# Patient Record
Sex: Female | Born: 1935 | Race: White | Hispanic: No | Smoking: Never smoker
Health system: Southern US, Community
[De-identification: ages and names within clinical notes are randomized; demographics above are authoritative.]

## PROBLEM LIST (undated history)

## (undated) DIAGNOSIS — I10 Essential (primary) hypertension: Secondary | ICD-10-CM

## (undated) DIAGNOSIS — I639 Cerebral infarction, unspecified: Secondary | ICD-10-CM

## (undated) DIAGNOSIS — E119 Type 2 diabetes mellitus without complications: Secondary | ICD-10-CM

## (undated) HISTORY — PX: ABDOMINAL HYSTERECTOMY: SHX81

## (undated) HISTORY — PX: KIDNEY STONE SURGERY: SHX686

## (undated) HISTORY — PX: CHOLECYSTECTOMY: SHX55

---

## 2004-09-06 ENCOUNTER — Ambulatory Visit: Payer: Self-pay | Admitting: Internal Medicine

## 2007-04-29 ENCOUNTER — Ambulatory Visit: Payer: Self-pay | Admitting: Internal Medicine

## 2007-08-27 ENCOUNTER — Other Ambulatory Visit: Payer: Self-pay

## 2007-08-27 ENCOUNTER — Ambulatory Visit: Payer: Self-pay | Admitting: Urology

## 2007-08-28 ENCOUNTER — Ambulatory Visit: Payer: Self-pay | Admitting: Cardiology

## 2007-09-02 ENCOUNTER — Ambulatory Visit: Payer: Self-pay | Admitting: Urology

## 2008-06-02 ENCOUNTER — Ambulatory Visit: Payer: Self-pay | Admitting: Internal Medicine

## 2012-04-16 ENCOUNTER — Ambulatory Visit: Payer: Self-pay | Admitting: Ophthalmology

## 2012-04-29 ENCOUNTER — Ambulatory Visit: Payer: Self-pay | Admitting: Ophthalmology

## 2012-07-01 ENCOUNTER — Ambulatory Visit: Payer: Self-pay | Admitting: Ophthalmology

## 2014-10-20 NOTE — Op Note (Signed)
PATIENT NAME:  Suzanne Cantu, Suzanne Cantu MR#:  [id number] DATE OF BIRTH:  [birth year]  DATE OF PROCEDURE:  04/29/2012  PREOPERATIVE DIAGNOSIS:  Cataract, right eye.   POSTOPERATIVE DIAGNOSIS:  Cataract, right eye.  PROCEDURE PERFORMED:  Extracapsular cataract extraction using phacoemulsification with placement of an Alcon SN6CWS, 21.0-diopter posterior chamber lens, serial # [id number].  SURGEON:  Maylon PeppersSteven A. Iain Sawchuk, MD  ASSISTANT:  None.  ANESTHESIA:  4% lidocaine and 0.75% Marcaine in a 50/50 mixture with 10 units/mL of Hylenex added, given as peribulbar.  ANESTHESIOLOGIST:  Dr. Dimple Caseyice   COMPLICATIONS:  None.  ESTIMATED BLOOD LOSS:  Less than 1 mL.  DESCRIPTION OF PROCEDURE:  The patient was brought to the operating room and given a peribulbar block.  The patient was then prepped and draped in the usual fashion.  The vertical rectus muscles were imbricated using 5-0 silk sutures.  These sutures were then clamped to the sterile drapes as bridle sutures.  A limbal peritomy was performed extending two clock hours and hemostasis was obtained with cautery.  A partial thickness scleral groove was made at the surgical limbus and dissected anteriorly in a lamellar dissection using an Alcon crescent knife.  The anterior chamber was entered superonasally with a Superblade and through the lamellar dissection with a 2.6 mm keratome.  DisCoVisc was used to replace the aqueous and a continuous tear capsulorrhexis was carried out.  Hydrodissection and hydrodelineation were carried out with balanced salt and a 27 gauge canula.  The nucleus was rotated to confirm the effectiveness of the hydrodissection.  Phacoemulsification was carried out using a divide-and-conquer technique.  Total ultrasound time was 2 minutes and 30 seconds with an average power of 22.2 percent, CDE 53.03.  Irrigation/aspiration was used to remove the residual cortex.  DisCoVisc was used to inflate the capsule and the internal incision was  enlarged to 3 mm with the crescent knife.  The intraocular lens was folded and inserted into the capsular bag using the AcrySert delivery system.  Irrigation/aspiration was used to remove the residual DisCoVisc.  Miostat was injected into the anterior chamber through the paracentesis track to inflate the anterior chamber and induce miosis.  The wound was checked for leaks and none were found. The conjunctiva was closed with cautery and the bridle sutures were removed.  Two drops of 0.3% Vigamox were placed on the eye.   An eye shield was placed on the eye.  The patient was discharged to the recovery room in good condition.  ____________________________ Maylon PeppersSteven A. Wandalee Klang, MD sad:drc D: 04/29/2012 13:47:24 ET T: 04/29/2012 14:09:27 ET JOB#: [id number]  cc: Viviann SpareSteven A. Korey Prashad, MD, <Dictator> Erline LevineSTEVEN A Lukasz Rogus MD ELECTRONICALLY SIGNED 05/01/2012 14:59

## 2014-10-23 NOTE — Op Note (Signed)
PATIENT NAME:  Suzanne Cantu, [name] MR#:  [id number] DATE OF BIRTH:  1927-01-11  DATE OF PROCEDURE:  07/01/2012  PREOPERATIVE DIAGNOSIS:  Cataract, left eye.    POSTOPERATIVE DIAGNOSIS:  Cataract, left eye.  PROCEDURE PERFORMED:  Extracapsular cataract extraction using phacoemulsification with placement of an Alcon SN6CWS, 20.5-diopter posterior chamber lens, serial #[id number].  SURGEON:  Maylon PeppersSteven A. Chace Bisch, MD  ASSISTANT:  None.  ANESTHESIA:  4% lidocaine and 0.75% Marcaine in a 50/50 mixture with 10 units/mL of Hylenex added, given as a peribulbar.   ANESTHESIOLOGIST:  Randall AnGjibertus Van Staveren, MD  COMPLICATIONS:  None.  ESTIMATED BLOOD LOSS:  Less than 1 ml.  DESCRIPTION OF PROCEDURE:  The patient was brought to the operating room and given a peribulbar block.  The patient was then prepped and draped in the usual fashion.  The vertical rectus muscles were imbricated using 5-0 silk sutures.  These sutures were then clamped to the sterile drapes as bridle sutures.  A limbal peritomy was performed extending two clock hours and hemostasis was obtained with cautery.  A partial thickness scleral groove was made at the surgical limbus and dissected anteriorly in a lamellar dissection using an Alcon crescent knife.  The anterior chamber was entered supero-temporally with a Superblade and through the lamellar dissection with a 2.6 mm keratome.  DisCoVisc was used to replace the aqueous and a continuous tear capsulorrhexis was carried out.  Hydrodissection and hydrodelineation were carried out with balanced salt and a 27 gauge canula.  The nucleus was rotated to confirm the effectiveness of the hydrodissection.  Phacoemulsification was carried out using a divide-and-conquer technique.  Total ultrasound time was 2 minutes and 39 seconds with an average power of 23.8 percent and CDE of 58.64.  Irrigation/aspiration was used to remove the residual cortex.  DisCoVisc was used to inflate the capsule and  the internal incision was enlarged to 3 mm with the crescent knife.  The intraocular lens was folded and inserted into the capsular bag using the AcrySert delivery system.  Irrigation/aspiration was used to remove the residual DisCoVisc.  Miostat was injected into the anterior chamber through the paracentesis track to inflate the anterior chamber and induce miosis.  The wound was checked for leaks and none were found. The conjunctiva was closed with cautery and the bridle sutures were removed.  Two drops of 0.3% Vigamox were placed on the eye.   Cephalexin, 0.1 mL, was injected into the anterior chamber at the end of the procedure via 27-guage cannula in the paracentesis track. An eye shield was placed on the eye.  The patient was discharged to the recovery room in good condition. ____________________________ Maylon PeppersSteven A. Tucker Minter, MD sad:sb D: 07/01/2012 13:23:09 ET T: 07/01/2012 14:53:53 ET JOB#: [id number]  cc: Viviann SpareSteven A. Day Greb, MD, <Dictator> Erline LevineSTEVEN A Domonique Cothran MD ELECTRONICALLY SIGNED 07/08/2012 13:46

## 2019-03-12 ENCOUNTER — Encounter (HOSPITAL_COMMUNITY): Payer: Self-pay | Admitting: Emergency Medicine

## 2019-03-12 ENCOUNTER — Emergency Department (HOSPITAL_COMMUNITY): Payer: Medicare Other

## 2019-03-12 ENCOUNTER — Other Ambulatory Visit: Payer: Self-pay

## 2019-03-12 ENCOUNTER — Emergency Department (HOSPITAL_COMMUNITY)
Admission: EM | Admit: 2019-03-12 | Discharge: 2019-03-13 | Disposition: A | Discharge status: Home / Self Care | Payer: Medicare Other | Attending: Emergency Medicine | Admitting: Emergency Medicine

## 2019-03-12 DIAGNOSIS — E119 Type 2 diabetes mellitus without complications: Secondary | ICD-10-CM | POA: Insufficient documentation

## 2019-03-12 DIAGNOSIS — R410 Disorientation, unspecified: Secondary | ICD-10-CM

## 2019-03-12 DIAGNOSIS — Z7982 Long term (current) use of aspirin: Secondary | ICD-10-CM | POA: Diagnosis not present

## 2019-03-12 DIAGNOSIS — Z7984 Long term (current) use of oral hypoglycemic drugs: Secondary | ICD-10-CM | POA: Insufficient documentation

## 2019-03-12 DIAGNOSIS — R10815 Periumbilic abdominal tenderness: Secondary | ICD-10-CM | POA: Insufficient documentation

## 2019-03-12 DIAGNOSIS — I10 Essential (primary) hypertension: Secondary | ICD-10-CM | POA: Insufficient documentation

## 2019-03-12 DIAGNOSIS — R4182 Altered mental status, unspecified: Secondary | ICD-10-CM | POA: Diagnosis present

## 2019-03-12 DIAGNOSIS — Z79899 Other long term (current) drug therapy: Secondary | ICD-10-CM | POA: Insufficient documentation

## 2019-03-12 DIAGNOSIS — E860 Dehydration: Secondary | ICD-10-CM | POA: Diagnosis not present

## 2019-03-12 HISTORY — DX: Essential (primary) hypertension: I10

## 2019-03-12 HISTORY — DX: Type 2 diabetes mellitus without complications: E11.9

## 2019-03-12 LAB — POCT I-STAT EG7
Acid-Base Excess: 4 mmol/L — ABNORMAL HIGH (ref 0.0–2.0)
Bicarbonate: 30.3 mmol/L — ABNORMAL HIGH (ref 20.0–28.0)
Calcium, Ion: 1.29 mmol/L (ref 1.15–1.40)
HCT: 42 % (ref 36.0–46.0)
Hemoglobin: 14.3 g/dL (ref 12.0–15.0)
O2 Saturation: 56 %
Potassium: 4.2 mmol/L (ref 3.5–5.1)
Sodium: 129 mmol/L — ABNORMAL LOW (ref 135–145)
TCO2: 32 mmol/L (ref 22–32)
pCO2, Ven: 50.1 mmHg (ref 44.0–60.0)
pH, Ven: 7.39 (ref 7.250–7.430)
pO2, Ven: 30 mmHg — CL (ref 32.0–45.0)

## 2019-03-12 LAB — URINALYSIS, ROUTINE W REFLEX MICROSCOPIC
Bacteria, UA: NONE SEEN
Bilirubin Urine: NEGATIVE
Glucose, UA: NEGATIVE mg/dL
Hgb urine dipstick: NEGATIVE
Ketones, ur: 5 mg/dL — AB
Nitrite: NEGATIVE
Protein, ur: NEGATIVE mg/dL
Specific Gravity, Urine: 1.012 (ref 1.005–1.030)
pH: 6 (ref 5.0–8.0)

## 2019-03-12 LAB — COMPREHENSIVE METABOLIC PANEL
ALT: 16 U/L (ref 0–44)
AST: 22 U/L (ref 15–41)
Albumin: 3.9 g/dL (ref 3.5–5.0)
Alkaline Phosphatase: 47 U/L (ref 38–126)
Anion gap: 11 (ref 5–15)
BUN: 16 mg/dL (ref 8–23)
CO2: 26 mmol/L (ref 22–32)
Calcium: 9.9 mg/dL (ref 8.9–10.3)
Chloride: 91 mmol/L — ABNORMAL LOW (ref 98–111)
Creatinine, Ser: 0.58 mg/dL (ref 0.44–1.00)
GFR calc Af Amer: >60 mL/min (ref >60)
GFR calc non Af Amer: >60 mL/min (ref >60)
Glucose, Bld: 169 mg/dL — ABNORMAL HIGH (ref 70–99)
Potassium: 4 mmol/L (ref 3.5–5.1)
Sodium: 128 mmol/L — ABNORMAL LOW (ref 135–145)
Total Bilirubin: 0.7 mg/dL (ref 0.3–1.2)
Total Protein: 7.1 g/dL (ref 6.5–8.1)

## 2019-03-12 LAB — CBC WITH DIFFERENTIAL/PLATELET
Abs Immature Granulocytes: 0.04 10*3/uL (ref 0.00–0.07)
Basophils Absolute: 0 10*3/uL (ref 0.0–0.1)
Basophils Relative: 0 %
Eosinophils Absolute: 0 10*3/uL (ref 0.0–0.5)
Eosinophils Relative: 0 %
HCT: 41.6 % (ref 36.0–46.0)
Hemoglobin: 14.5 g/dL (ref 12.0–15.0)
Immature Granulocytes: 1 %
Lymphocytes Relative: 16 %
Lymphs Abs: 1.2 10*3/uL (ref 0.7–4.0)
MCH: 31.5 pg (ref 26.0–34.0)
MCHC: 34.9 g/dL (ref 30.0–36.0)
MCV: 90.2 fL (ref 80.0–100.0)
Monocytes Absolute: 0.6 10*3/uL (ref 0.1–1.0)
Monocytes Relative: 7 %
Neutro Abs: 5.6 10*3/uL (ref 1.7–7.7)
Neutrophils Relative %: 76 %
Platelets: 246 10*3/uL (ref 150–400)
RBC: 4.61 MIL/uL (ref 3.87–5.11)
RDW: 12.1 % (ref 11.5–15.5)
WBC: 7.5 10*3/uL (ref 4.0–10.5)
nRBC: 0 % (ref 0.0–0.2)

## 2019-03-12 LAB — ETHANOL: Alcohol, Ethyl (B): <10 mg/dL (ref <10)

## 2019-03-12 IMAGING — CT CT HEAD W/O CM
4 series · 17 of 47 positions shown, 19 images · non-contrast
Comparison: None.

CLINICAL DATA: Altered level of consciousness

EXAM:
CT HEAD WITHOUT CONTRAST
TECHNIQUE: Contiguous axial images were obtained from the base of the skull
through the vertex without intravenous contrast.

[Series 2: head wo · axial · 0.41mm/px · z∈[-94,+32]mm · 7 of 35 slices shown, 9 images]
[im 5/35  brain]
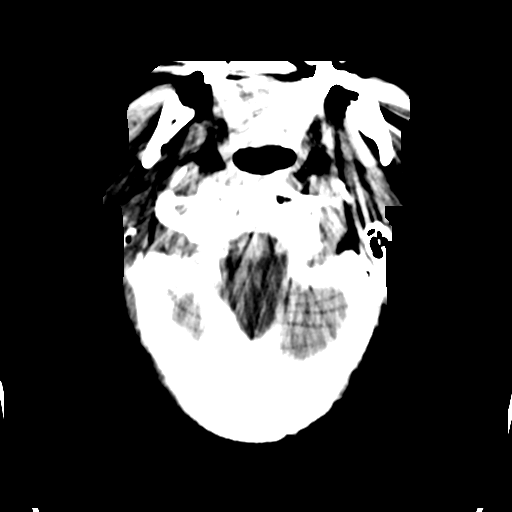
[im 5/35  bone]
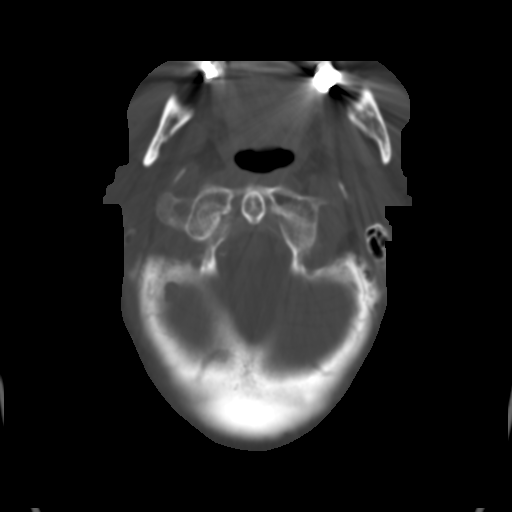
[im 9/35  brain]
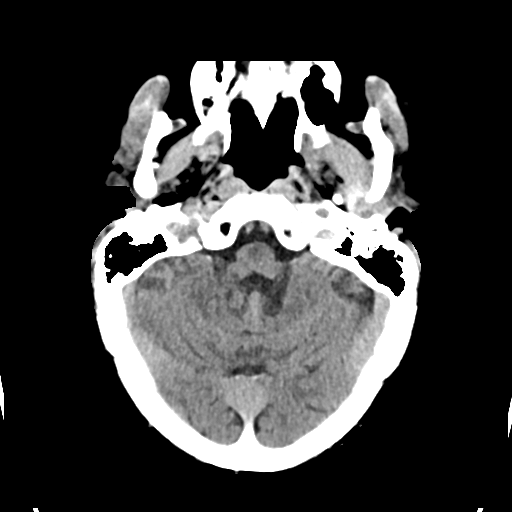
[im 13/35  brain]
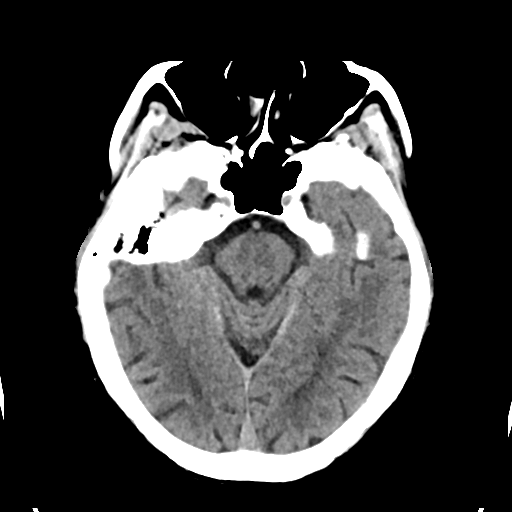
[im 18/35  brain]
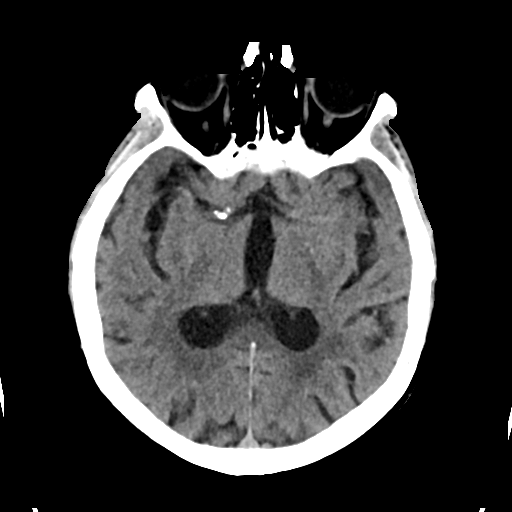
[im 22/35  brain]
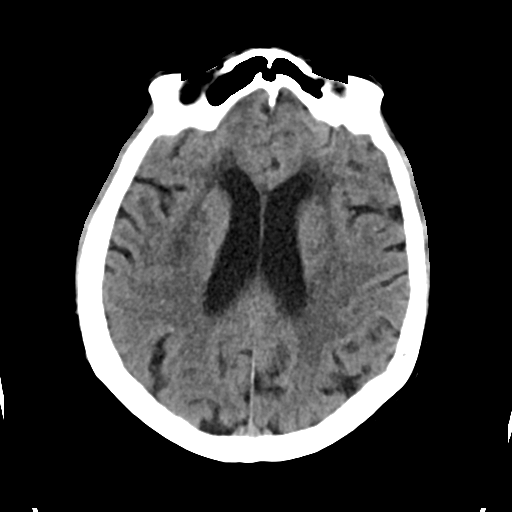
[im 22/35  bone]
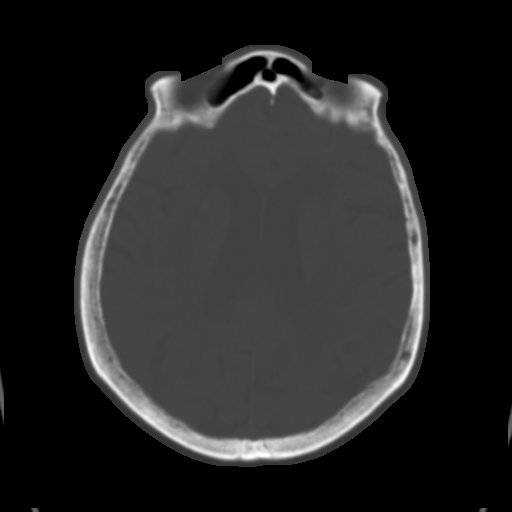
[im 26/35  brain]
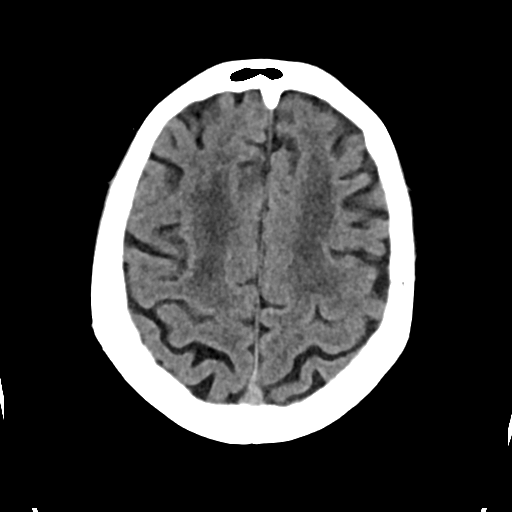
[im 30/35  brain]
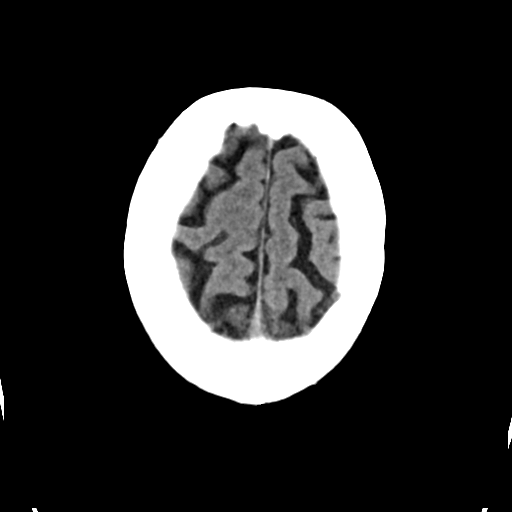

[Series 3: head bone · axial · 0.41mm/px · z∈[-98,-38]mm · 4 of 87 slices shown]
[im 9/87  bone]
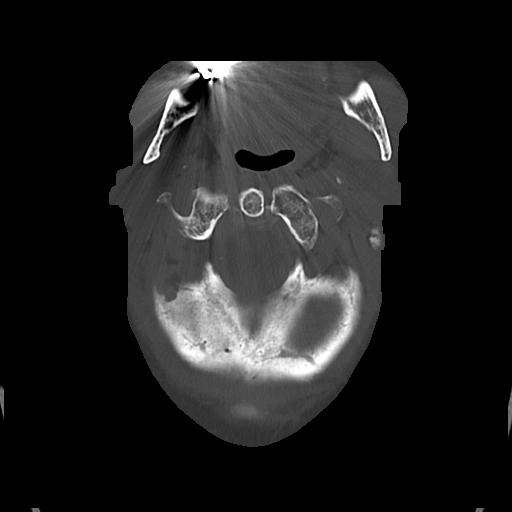
[im 18/87  bone]
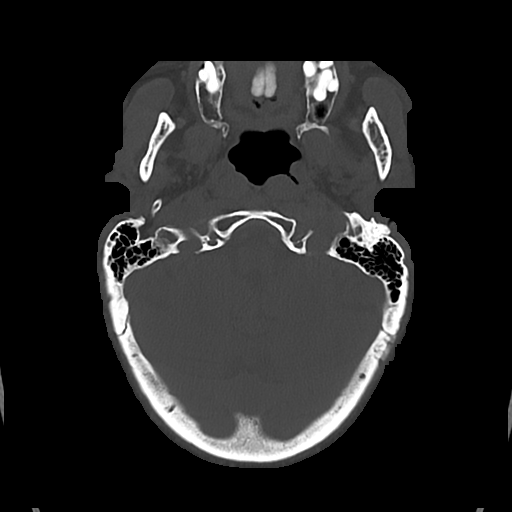
[im 26/87  bone]
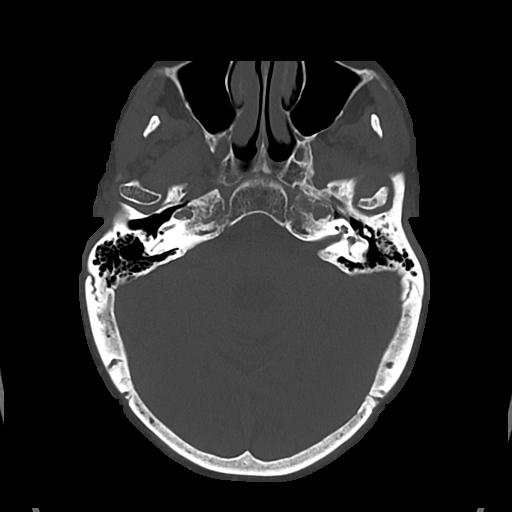
[im 39/87  bone]
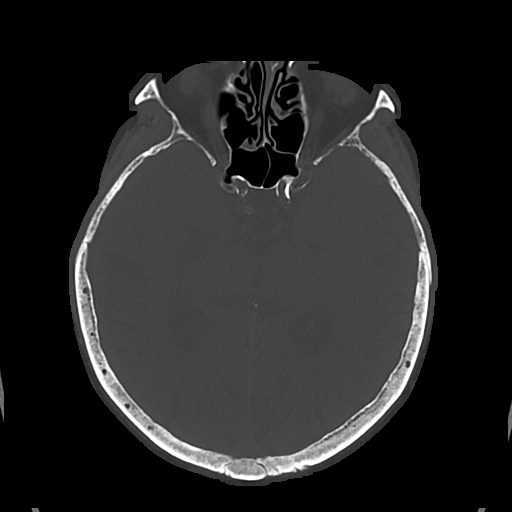

[Series 4: cor soft · coronal · 0.35mm/px · 3 of 74 slices shown]
[im 27/74  brain]
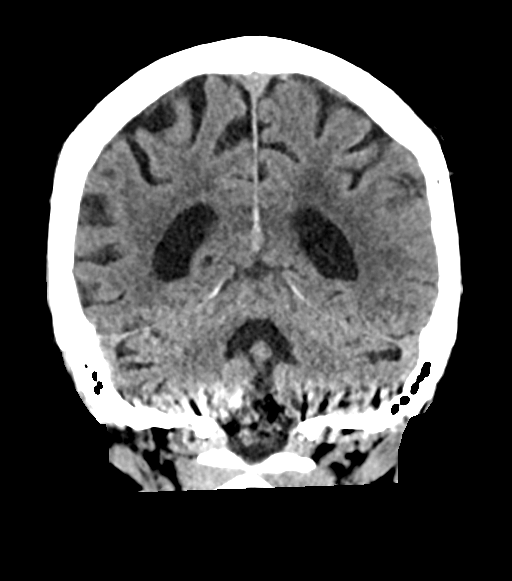
[im 34/74  brain]
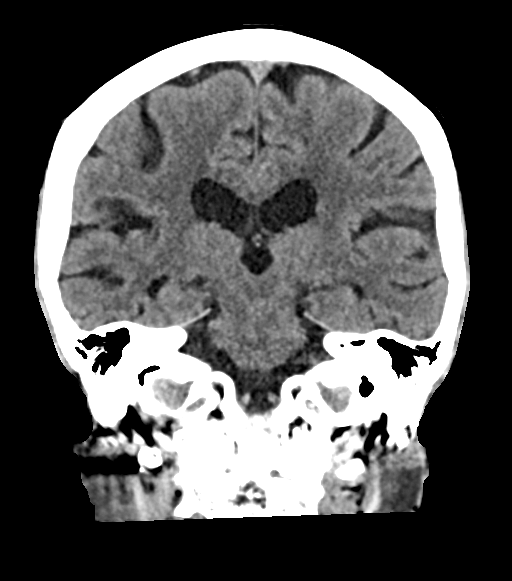
[im 40/74  brain]
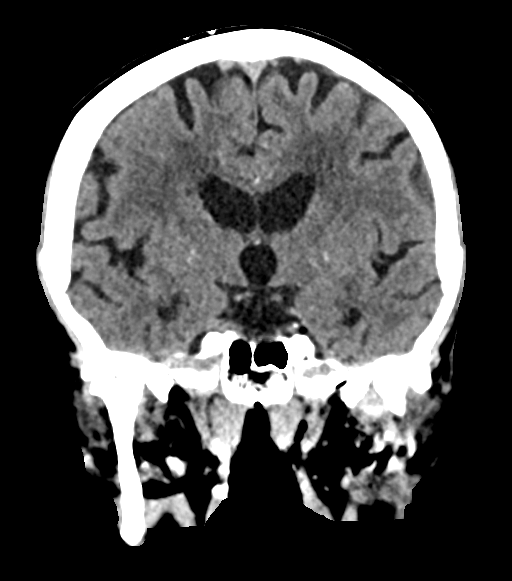

[Series 5: sag soft · sagittal · 0.39mm/px · 3 of 60 slices shown]
[im 20/60  brain]
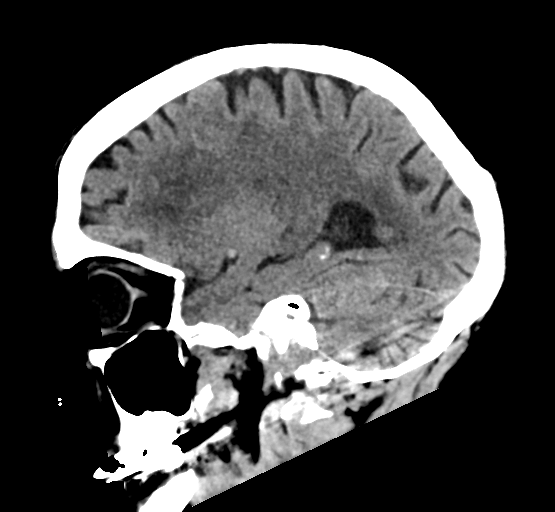
[im 30/60  brain]
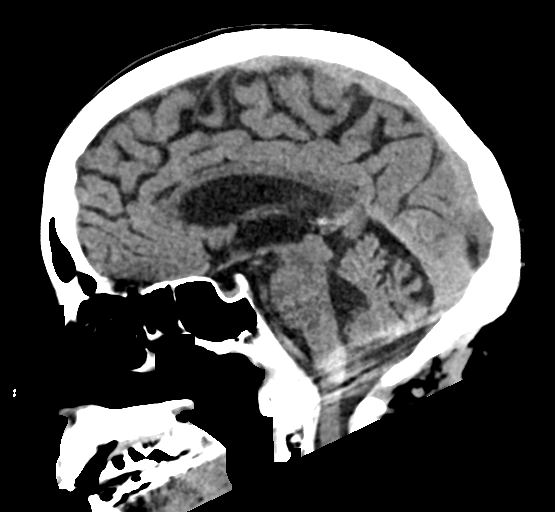
[im 40/60  brain]
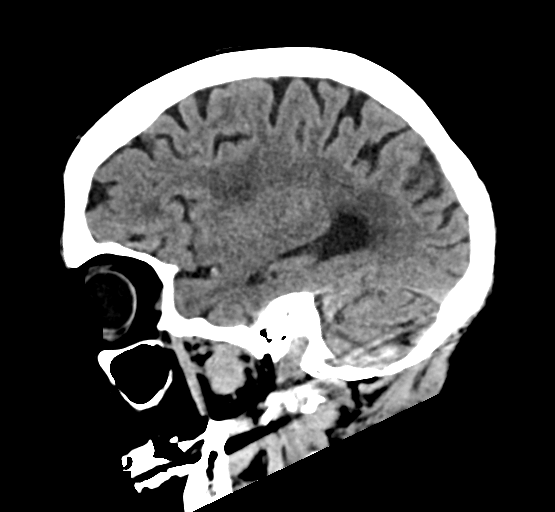

[17 of 47 positions shown; findings below may reference images not displayed]

FINDINGS: Brain: No evidence of acute territorial infarction, hemorrhage,
hydrocephalus,extra-axial collection or mass lesion/mass effect.
There is dilatation the ventricles and sulci consistent with
age-related atrophy. Low-attenuation changes in the deep white
matter consistent with small vessel ischemia.

Vascular: No hyperdense vessel or unexpected calcification.

Skull: The skull is intact. No fracture or focal lesion identified.

Sinuses/Orbits: The visualized paranasal sinuses and mastoid air
cells are clear. The orbits and globes intact.

Other: None
IMPRESSION: No acute intracranial pathology.

Findings consistent with age related atrophy and chronic small
vessel ischemia

## 2019-03-12 IMAGING — CR DG CHEST 2V
3 series · 3 of 3 positions shown · non-contrast
Comparison: None.

CLINICAL DATA: Loss of consciousness

EXAM:
CHEST - 2 VIEW

[chest lat]
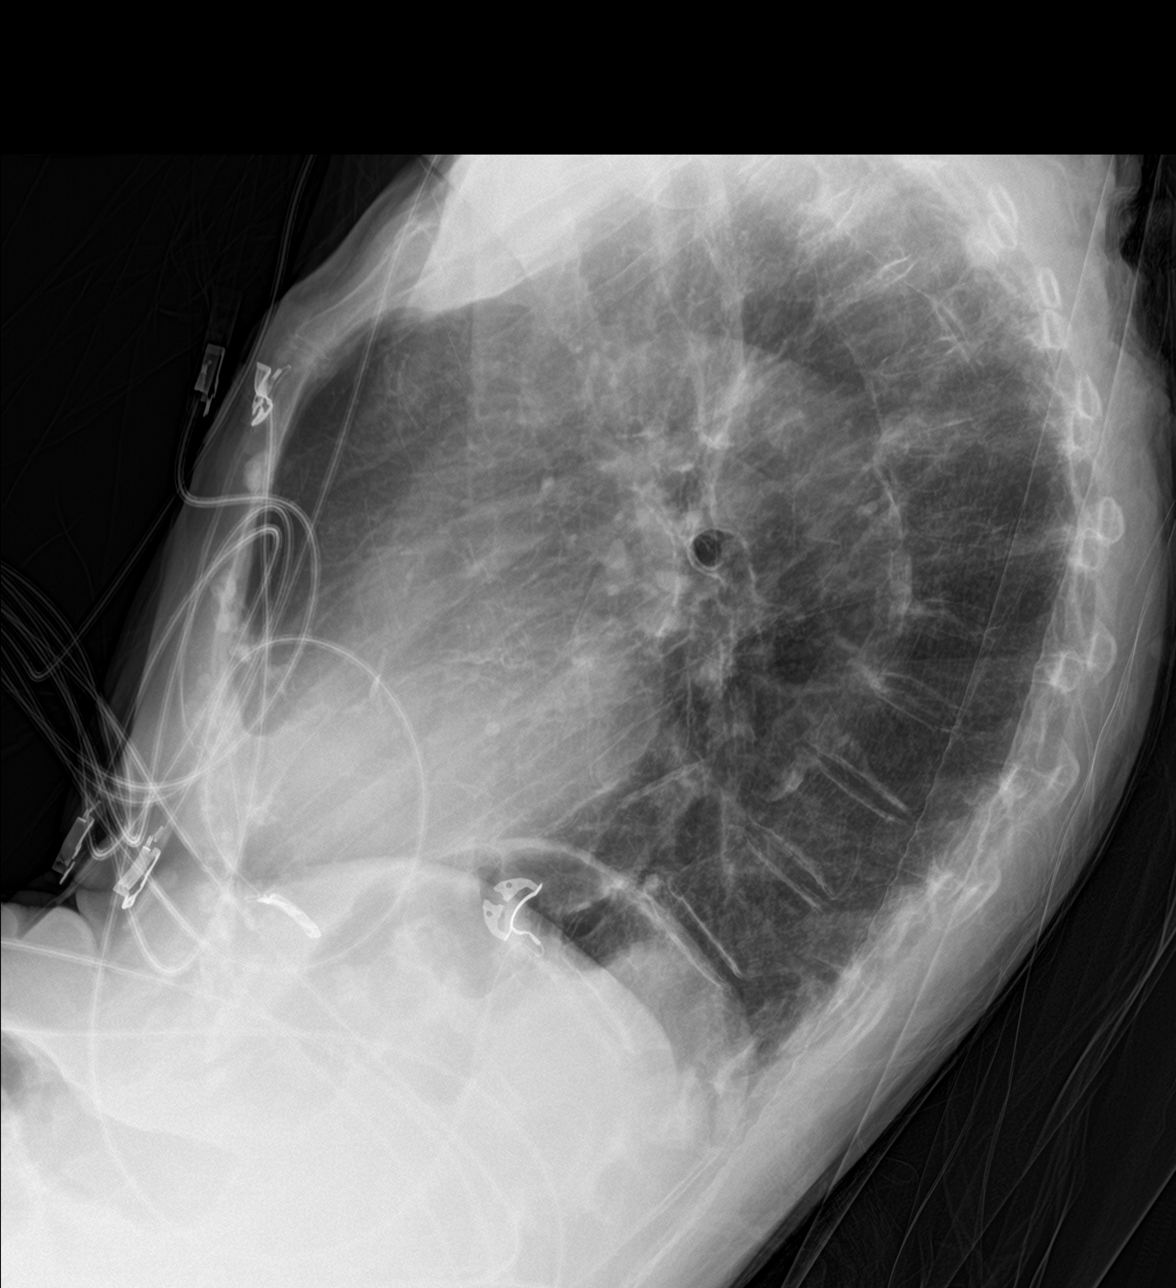

[chest ap (1 of 2)]
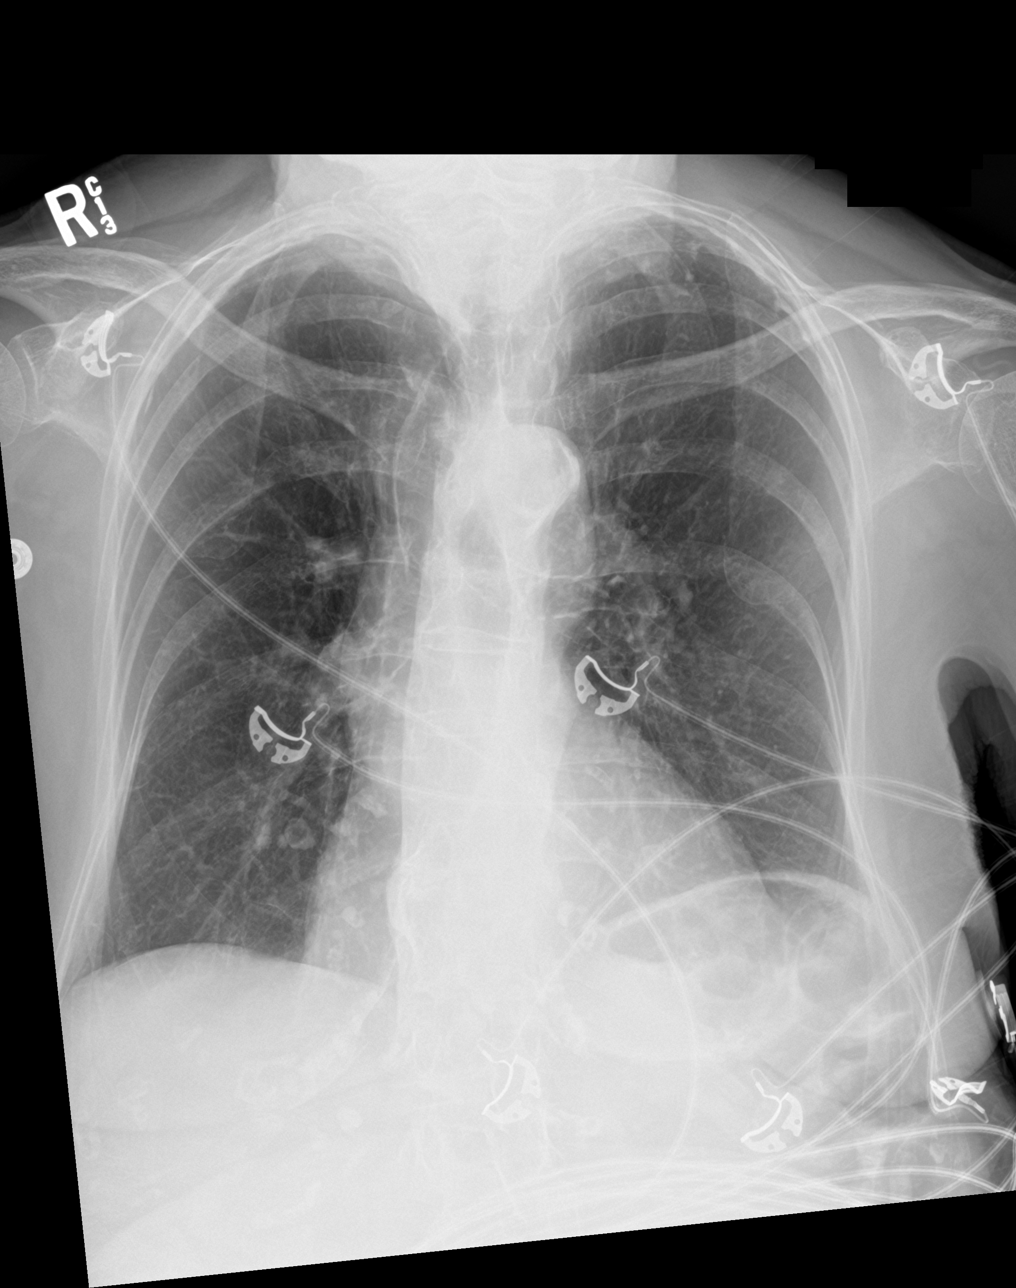

[chest ap (2 of 2)]
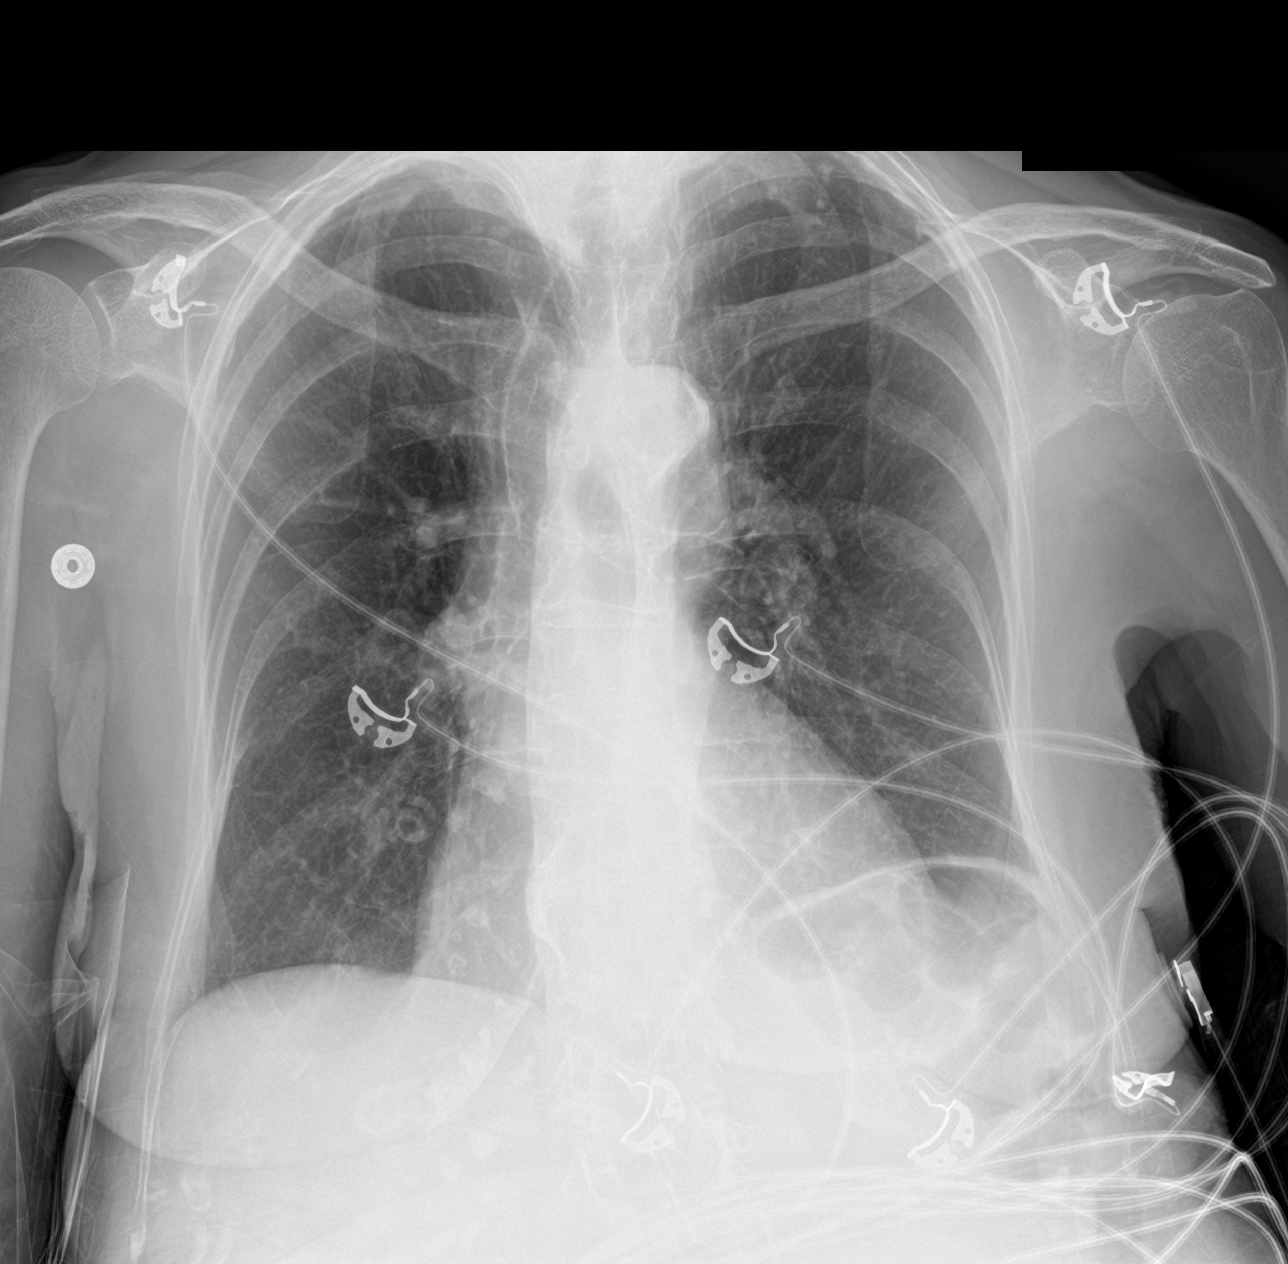

[3 of 3 positions shown; findings below may reference images not displayed]

FINDINGS: There is mild cardiomegaly. Hyperinflation of the upper lung zones
are seen. No large airspace consolidation or pleural effusion.
Aortic knob calcifications. Degenerative changes in the thoracic
spine.
IMPRESSION: No acute cardiopulmonary disease.

## 2019-03-12 MED ORDER — SODIUM CHLORIDE 0.9 % IV BOLUS
500.0000 mL | Freq: Once | INTRAVENOUS | Status: AC
  Administered 2019-03-12: 500 mL via INTRAVENOUS

## 2019-03-12 MED ORDER — ACETAMINOPHEN 500 MG PO TABS
1000.0000 mg | ORAL_TABLET | Freq: Once | ORAL | Status: AC
  Administered 2019-03-12: 1000 mg via ORAL
  Filled 2019-03-12: qty 2

## 2019-03-12 MED ORDER — SODIUM CHLORIDE 0.9 % IV BOLUS
1000.0000 mL | Freq: Once | INTRAVENOUS | Status: AC
  Administered 2019-03-12: 21:00:00 1000 mL via INTRAVENOUS

## 2019-03-12 NOTE — ED Provider Notes (Signed)
[facility] Provider Note   CSN: [id number] Arrival date & time: 03/12/19  1829     History   Chief Complaint Chief Complaint  Patient presents with  . Weakness  . Altered Mental Status    HPI Suzanne Cantu is a 85 y.o. female.     85 yo F with a chief complaints of confusion.  This is been off and on for the past 72 hours.  Patient about 10 days ago was having worsening left-sided leg pain that went from her buttock down to her knee.  She was treated with muscle relaxant.  Her pain is much better at this point.  They deny any infectious symptoms denies cough congestion increased frequency hesitancy or dysuria.  Denies abdominal pain vomiting or diarrhea.  He did feel that her eating had decreased somewhat.  No cough no fever.  She had been to a wedding about a week preceding her leg pain.  Denies trauma.  Other than this new medication denies any change in medications.  Her symptoms seem to come and go.  She was confused yesterday and then resolved by bedtime and then was normal this morning and became confused this afternoon and worsening after she took a nap.  The history is provided by the patient and a relative.  Weakness Severity:  Moderate Onset quality:  Gradual Duration:  3 days Timing:  Intermittent Progression:  Waxing and waning Chronicity:  New Context: change in medication   Relieved by:  Nothing Worsened by:  Nothing Ineffective treatments:  None tried Associated symptoms: no arthralgias, no chest pain, no dizziness, no dysuria, no fever, no headaches, no myalgias, no nausea, no shortness of breath, no urgency and no vomiting   Altered Mental Status Associated symptoms: weakness   Associated symptoms: no fever, no headaches, no nausea, no palpitations and no vomiting     Past Medical History:  Diagnosis Date  . Diabetes mellitus without complication (HCC)   . Hypertension     There are no active problems to  display for this patient.   Past Surgical History:  Procedure Laterality Date  . ABDOMINAL HYSTERECTOMY    . CESAREAN SECTION     4  . CHOLECYSTECTOMY    . KIDNEY STONE SURGERY       OB History   No obstetric history on file.      Home Medications    Prior to Admission medications   Medication Sig Start Date End Date Taking? Authorizing Provider  aspirin EC 81 MG tablet Take 81 mg by mouth daily.   Yes [provider]  bimatoprost (LUMIGAN) 0.01 % SOLN Place 1 drop into both eyes at bedtime.   Yes [provider]  ibuprofen (ADVIL) 200 MG tablet Take 200 mg by mouth every 6 (six) hours as needed for moderate pain.   Yes [provider]  metaxalone (SKELAXIN) 400 MG tablet Take 400 mg by mouth 3 (three) times daily.   Yes [provider]  metFORMIN (GLUCOPHAGE) 500 MG tablet Take 500 mg by mouth daily with breakfast.    Yes [provider]  valsartan-hydrochlorothiazide (DIOVAN-HCT) 160-12.5 MG tablet Take 1 tablet by mouth daily.   Yes [provider]    Family History History reviewed. No pertinent family history.  Social History Social History   Tobacco Use  . Smoking status: Never Smoker  . Smokeless tobacco: Never Used  Substance Use Topics  . Alcohol use: Never    Frequency:  Never  . Drug use: Not on file     Allergies   Patient has no known allergies.   Review of Systems Review of Systems  Constitutional: Positive for activity change. Negative for chills and fever.  HENT: Negative for congestion and rhinorrhea.   Eyes: Negative for redness and visual disturbance.  Respiratory: Negative for shortness of breath and wheezing.   Cardiovascular: Negative for chest pain and palpitations.  Gastrointestinal: Negative for nausea and vomiting.  Genitourinary: Negative for dysuria and urgency.  Musculoskeletal: Negative for arthralgias and myalgias.  Skin: Negative for pallor and wound.  Neurological:  Positive for weakness. Negative for dizziness and headaches.     Physical Exam Updated Vital Signs BP (!) 158/70   Pulse 78   Temp 98.5 F (36.9 C) (Oral)   Resp 19   Ht 5\' 3"  (1.6 m)   Wt 56.7 kg   SpO2 97%   BMI 22.14 kg/m   Physical Exam Vitals signs and nursing note reviewed.  Constitutional:      General: She is not in acute distress.    Appearance: She is well-developed. She is not diaphoretic.  HENT:     Head: Normocephalic and atraumatic.  Eyes:     Pupils: Pupils are equal, round, and reactive to light.  Neck:     Musculoskeletal: Normal range of motion and neck supple.  Cardiovascular:     Rate and Rhythm: Normal rate and regular rhythm.     Heart sounds: No murmur. No friction rub. No gallop.   Pulmonary:     Effort: Pulmonary effort is normal.     Breath sounds: No wheezing or rales.  Abdominal:     General: There is no distension.     Palpations: Abdomen is soft.     Tenderness: There is abdominal tenderness.     Comments:  Mild tenderness overlying the bladder.  Musculoskeletal:        General: No tenderness.     Comments: Small abrasion to the left posterior elbow.  No erythema or warmth.  No appreciable midline spinal tenderness.  Skin:    General: Skin is warm and dry.  Neurological:     Mental Status: She is alert and oriented to person, place, and time.     Comments: Confused about recent events  Psychiatric:        Behavior: Behavior normal.      ED Treatments / Results  Labs (all labs ordered are listed, but only abnormal results are displayed) Labs Reviewed  COMPREHENSIVE METABOLIC PANEL - Abnormal; Notable for the following components:      Result Value   Sodium 128 (*)    Chloride 91 (*)    Glucose, Bld 169 (*)    All other components within normal limits  URINALYSIS, ROUTINE W REFLEX MICROSCOPIC - Abnormal; Notable for the following components:   Ketones, ur 5 (*)    Leukocytes,Ua MODERATE (*)    All other components within  normal limits  POCT I-STAT EG7 - Abnormal; Notable for the following components:   pO2, Ven 30.0 (*)    Bicarbonate 30.3 (*)    Acid-Base Excess 4.0 (*)    Sodium 129 (*)    All other components within normal limits  URINE CULTURE  ETHANOL  CBC WITH DIFFERENTIAL/PLATELET  AMMONIA  CBG MONITORING, ED    EKG None   Radiology Dg Chest 2 View  Result Date: 03/12/2019 CLINICAL DATA:  Loss of consciousness EXAM: CHEST - 2 VIEW COMPARISON:  None. FINDINGS: There is mild cardiomegaly. Hyperinflation of the upper lung zones are seen. No large airspace consolidation or pleural effusion. Aortic knob calcifications. Degenerative changes in the thoracic spine. IMPRESSION: No acute cardiopulmonary disease. Electronically Signed   By: Jonna Clark M.D.   On: 03/12/2019 20:03   Ct Head Wo Contrast  Result Date: 03/12/2019 CLINICAL DATA:  Altered level of consciousness EXAM: CT HEAD WITHOUT CONTRAST TECHNIQUE: Contiguous axial images were obtained from the base of the skull through the vertex without intravenous contrast. COMPARISON:  None. FINDINGS: Brain: No evidence of acute territorial infarction, hemorrhage, hydrocephalus,extra-axial collection or mass lesion/mass effect. There is dilatation the ventricles and sulci consistent with age-related atrophy. Low-attenuation changes in the deep white matter consistent with small vessel ischemia. Vascular: No hyperdense vessel or unexpected calcification. Skull: The skull is intact. No fracture or focal lesion identified. Sinuses/Orbits: The visualized paranasal sinuses and mastoid air cells are clear. The orbits and globes intact. Other: None IMPRESSION: No acute intracranial pathology. Findings consistent with age related atrophy and chronic small vessel ischemia Electronically Signed   By: Jonna Clark M.D.   On: 03/12/2019 21:12    Procedures Procedures (including critical care time)  Medications Ordered in ED Medications  sodium chloride 0.9 % bolus  1,000 mL (0 mLs Intravenous Stopped 03/12/19 2223)  acetaminophen (TYLENOL) tablet 1,000 mg (1,000 mg Oral Given 03/12/19 2112)  sodium chloride 0.9 % bolus 500 mL (500 mLs Intravenous New Bag/Given 03/12/19 2250)     Initial Impression / Assessment and Plan / ED Course  I have reviewed the triage vital signs and the nursing notes.  Pertinent labs & imaging results that were available during my care of the patient were reviewed by me and considered in my medical decision making (see chart for details).        85 yo F with a chief complaints of altered mental status.  Sounds like delirium.  Off and on for the past 72 hours or so.  Most likely this is secondary to a medication changes the patient has been taking a muscle relaxant for left leg pain.  Will obtain a altered mental status work-up.  Give a bolus of IV fluids.  Tylenol for possible pain.  Reassess.  Patient feeling better, mental status improved.  UA without infection, cxr viewed by me without infiltrate.  No anemia.  Pco2 without hypercarbia.  Discussed with family, comfortable taking her home.  PCP follow up.   11:21 PM:  I have discussed the diagnosis/risks/treatment options with the patient and family and believe the pt to be eligible for discharge home to follow-up with PCP. We also discussed returning to the ED immediately if new or worsening sx occur. We discussed the sx which are most concerning (e.g., sudden worsening pain, fever, inability to tolerate by mouth) that necessitate immediate return. Medications administered to the patient during their visit and any new prescriptions provided to the patient are listed below.  Medications given during this visit Medications  sodium chloride 0.9 % bolus 1,000 mL (0 mLs Intravenous Stopped 03/12/19 2223)  acetaminophen (TYLENOL) tablet 1,000 mg (1,000 mg Oral Given 03/12/19 2112)  sodium chloride 0.9 % bolus 500 mL (500 mLs Intravenous New Bag/Given 03/12/19 2250)     The patient appears  reasonably screen and/or stabilized for discharge and I doubt any other medical condition or other [facility] requiring further screening, evaluation, or treatment in the ED at this time prior to discharge.    Final Clinical Impressions(s) /  ED Diagnoses   Final diagnoses:  Delirium  Dehydration    ED Discharge Orders    None       Melene PlanFloyd, Rykker Coviello, DO 03/12/19 2321

## 2019-03-12 NOTE — ED Triage Notes (Signed)
Pt BIB by EMS for generalized weakness and confusion. Per pt's son, pt was confused last night at dinner, was normal this morning. Family left the house at 1020, returned home at 1245 to find pt confused. Pt took a nap and woke up at 1630 more confused. Per family, pt's baseline is AOx4, but they report increased weakness, decreased fluid intake. Pt has hip pain that began 10 days ago, placed on a muscle relaxer Metaxalone (400 mg PO 3x day) that she has been taken. VSS.  Pt is alert to self and place, disoriented to time and situation.

## 2019-03-12 NOTE — Discharge Instructions (Signed)
Follow up with your family doc.  Return if it is unsafe for you to be at home

## 2019-03-13 NOTE — ED Notes (Signed)
Patient verbalized understanding of discharge instructions. Opportunity for questions were provided. Pt. ambulatory and discharged home.  

## 2019-03-14 LAB — URINE CULTURE: Culture: 10000 — AB

## 2019-05-12 ENCOUNTER — Other Ambulatory Visit (HOSPITAL_COMMUNITY): Payer: Self-pay | Admitting: Neurology

## 2019-05-12 ENCOUNTER — Other Ambulatory Visit: Payer: Self-pay | Admitting: Neurology

## 2019-05-12 DIAGNOSIS — R413 Other amnesia: Secondary | ICD-10-CM

## 2019-05-26 ENCOUNTER — Ambulatory Visit: Admission: RE | Admit: 2019-05-26 | Payer: Medicare Other | Source: Ambulatory Visit

## 2019-07-31 ENCOUNTER — Other Ambulatory Visit: Payer: Self-pay

## 2019-07-31 ENCOUNTER — Encounter (HOSPITAL_COMMUNITY): Payer: Self-pay | Admitting: Emergency Medicine

## 2019-07-31 ENCOUNTER — Inpatient Hospital Stay (HOSPITAL_COMMUNITY)
Admission: EM | Admit: 2019-07-31 | Discharge: 2019-08-02 | DRG: 078 | Disposition: A | Discharge status: Home / Self Care | Payer: Medicare Other | Attending: Family Medicine | Admitting: Family Medicine

## 2019-07-31 ENCOUNTER — Inpatient Hospital Stay (HOSPITAL_COMMUNITY): Payer: Medicare Other

## 2019-07-31 ENCOUNTER — Emergency Department (HOSPITAL_COMMUNITY): Payer: Medicare Other

## 2019-07-31 DIAGNOSIS — R059 Cough, unspecified: Secondary | ICD-10-CM

## 2019-07-31 DIAGNOSIS — G92 Toxic encephalopathy: Secondary | ICD-10-CM

## 2019-07-31 DIAGNOSIS — G9340 Encephalopathy, unspecified: Secondary | ICD-10-CM | POA: Diagnosis present

## 2019-07-31 DIAGNOSIS — E538 Deficiency of other specified B group vitamins: Secondary | ICD-10-CM | POA: Diagnosis present

## 2019-07-31 DIAGNOSIS — I160 Hypertensive urgency: Secondary | ICD-10-CM | POA: Insufficient documentation

## 2019-07-31 DIAGNOSIS — M549 Dorsalgia, unspecified: Secondary | ICD-10-CM | POA: Diagnosis present

## 2019-07-31 DIAGNOSIS — K7200 Acute and subacute hepatic failure without coma: Secondary | ICD-10-CM | POA: Insufficient documentation

## 2019-07-31 DIAGNOSIS — Z20822 Contact with and (suspected) exposure to covid-19: Secondary | ICD-10-CM | POA: Diagnosis present

## 2019-07-31 DIAGNOSIS — Z7984 Long term (current) use of oral hypoglycemic drugs: Secondary | ICD-10-CM

## 2019-07-31 DIAGNOSIS — R531 Weakness: Secondary | ICD-10-CM | POA: Diagnosis present

## 2019-07-31 DIAGNOSIS — R4781 Slurred speech: Secondary | ICD-10-CM | POA: Diagnosis present

## 2019-07-31 DIAGNOSIS — R05 Cough: Secondary | ICD-10-CM

## 2019-07-31 DIAGNOSIS — I10 Essential (primary) hypertension: Secondary | ICD-10-CM | POA: Diagnosis present

## 2019-07-31 DIAGNOSIS — Z7982 Long term (current) use of aspirin: Secondary | ICD-10-CM

## 2019-07-31 DIAGNOSIS — I674 Hypertensive encephalopathy: Principal | ICD-10-CM | POA: Diagnosis present

## 2019-07-31 DIAGNOSIS — K7682 Hepatic encephalopathy: Secondary | ICD-10-CM | POA: Insufficient documentation

## 2019-07-31 DIAGNOSIS — E119 Type 2 diabetes mellitus without complications: Secondary | ICD-10-CM | POA: Diagnosis present

## 2019-07-31 DIAGNOSIS — G8929 Other chronic pain: Secondary | ICD-10-CM

## 2019-07-31 DIAGNOSIS — I161 Hypertensive emergency: Secondary | ICD-10-CM | POA: Diagnosis present

## 2019-07-31 DIAGNOSIS — E871 Hypo-osmolality and hyponatremia: Secondary | ICD-10-CM | POA: Diagnosis present

## 2019-07-31 LAB — COMPREHENSIVE METABOLIC PANEL
ALT: 12 U/L (ref 0–44)
AST: 20 U/L (ref 15–41)
Albumin: 3.9 g/dL (ref 3.5–5.0)
Alkaline Phosphatase: 56 U/L (ref 38–126)
Anion gap: 11 (ref 5–15)
BUN: 9 mg/dL (ref 8–23)
CO2: 28 mmol/L (ref 22–32)
Calcium: 9.9 mg/dL (ref 8.9–10.3)
Chloride: 91 mmol/L — ABNORMAL LOW (ref 98–111)
Creatinine, Ser: 0.61 mg/dL (ref 0.44–1.00)
GFR calc Af Amer: >60 mL/min (ref >60)
GFR calc non Af Amer: >60 mL/min (ref >60)
Glucose, Bld: 151 mg/dL — ABNORMAL HIGH (ref 70–99)
Potassium: 4.1 mmol/L (ref 3.5–5.1)
Sodium: 130 mmol/L — ABNORMAL LOW (ref 135–145)
Total Bilirubin: 0.2 mg/dL — ABNORMAL LOW (ref 0.3–1.2)
Total Protein: 6.9 g/dL (ref 6.5–8.1)

## 2019-07-31 LAB — CBC
HCT: 41.5 % (ref 36.0–46.0)
Hemoglobin: 13.8 g/dL (ref 12.0–15.0)
MCH: 30.4 pg (ref 26.0–34.0)
MCHC: 33.3 g/dL (ref 30.0–36.0)
MCV: 91.4 fL (ref 80.0–100.0)
Platelets: 193 10*3/uL (ref 150–400)
RBC: 4.54 MIL/uL (ref 3.87–5.11)
RDW: 12.7 % (ref 11.5–15.5)
WBC: 7 10*3/uL (ref 4.0–10.5)
nRBC: 0 % (ref 0.0–0.2)

## 2019-07-31 LAB — DIFFERENTIAL
Abs Immature Granulocytes: 0.02 10*3/uL (ref 0.00–0.07)
Basophils Absolute: 0 10*3/uL (ref 0.0–0.1)
Basophils Relative: 0 %
Eosinophils Absolute: 0.2 10*3/uL (ref 0.0–0.5)
Eosinophils Relative: 3 %
Immature Granulocytes: 0 %
Lymphocytes Relative: 28 %
Lymphs Abs: 2 10*3/uL (ref 0.7–4.0)
Monocytes Absolute: 0.5 10*3/uL (ref 0.1–1.0)
Monocytes Relative: 8 %
Neutro Abs: 4.2 10*3/uL (ref 1.7–7.7)
Neutrophils Relative %: 61 %

## 2019-07-31 LAB — I-STAT CHEM 8, ED
BUN: 11 mg/dL (ref 8–23)
Calcium, Ion: 1.1 mmol/L — ABNORMAL LOW (ref 1.15–1.40)
Chloride: 91 mmol/L — ABNORMAL LOW (ref 98–111)
Creatinine, Ser: 0.6 mg/dL (ref 0.44–1.00)
Glucose, Bld: 149 mg/dL — ABNORMAL HIGH (ref 70–99)
HCT: 42 % (ref 36.0–46.0)
Hemoglobin: 14.3 g/dL (ref 12.0–15.0)
Potassium: 4 mmol/L (ref 3.5–5.1)
Sodium: 129 mmol/L — ABNORMAL LOW (ref 135–145)
TCO2: 29 mmol/L (ref 22–32)

## 2019-07-31 LAB — PROTIME-INR
INR: 0.9 (ref 0.8–1.2)
Prothrombin Time: 12.5 seconds (ref 11.4–15.2)

## 2019-07-31 LAB — HEMOGLOBIN A1C
Hgb A1c MFr Bld: 6.6 % — ABNORMAL HIGH (ref 4.8–5.6)
Mean Plasma Glucose: 142.72 mg/dL

## 2019-07-31 LAB — SARS CORONAVIRUS 2 (TAT 6-24 HRS): SARS Coronavirus 2: NEGATIVE

## 2019-07-31 LAB — APTT: aPTT: 22 seconds — ABNORMAL LOW (ref 24–36)

## 2019-07-31 LAB — CBG MONITORING, ED: Glucose-Capillary: 188 mg/dL — ABNORMAL HIGH (ref 70–99)

## 2019-07-31 IMAGING — MR MR HEAD W/O CM
12 of 13 series · 44 of 48 positions shown · non-contrast
Comparison: Head CT [2Q] hours today.

CLINICAL DATA: [AGE] female code stroke presentation, left
side weakness. Although clinical exam more suggestive of
hypertensive urgency/emergency.

EXAM:
MRI HEAD WITHOUT CONTRAST
TECHNIQUE: Multiplanar, multiecho pulse sequences of the brain and surrounding
structures were obtained without intravenous contrast.

[Series 5: DWI · axial · 3.0mm · 0.88mm/px · z∈[-49,+86]mm · 8 of 96 slices shown (1 of 4)]
[im 1/96]
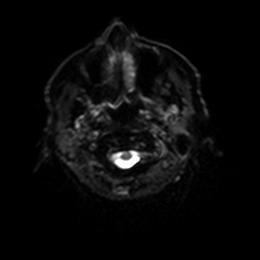
[im 14/96]
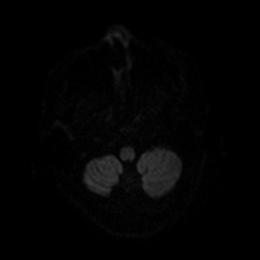
[im 28/96]
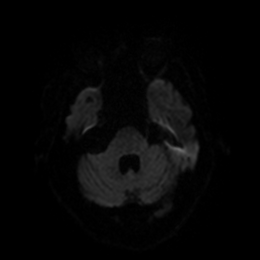
[im 41/96]
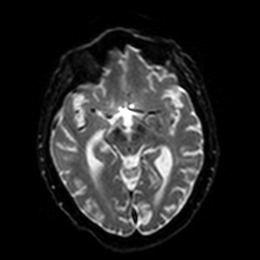
[im 55/96]
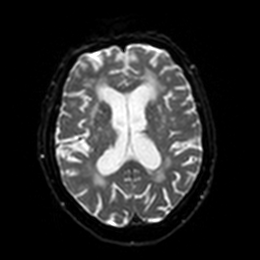
[im 68/96]
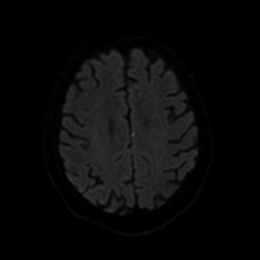
[im 82/96]
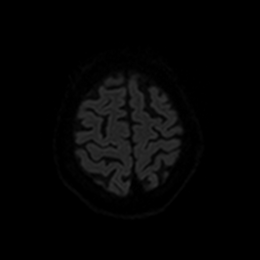
[im 96/96]
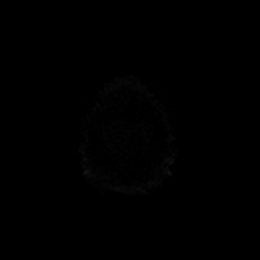

[Series 6: DWI · axial · 3.0mm · 0.88mm/px · z∈[-49,+86]mm · 4 of 48 slices shown (2 of 4)]
[im 1/48]
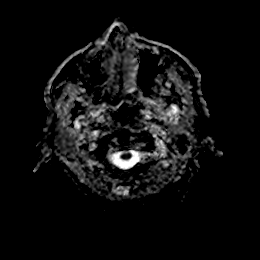
[im 16/48]
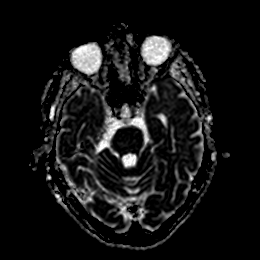
[im 32/48]
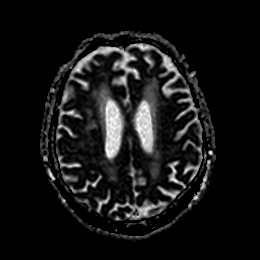
[im 48/48]
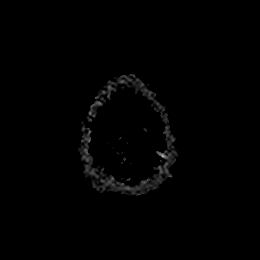

[Series 7: DWI · coronal · 4.0mm · 0.88mm/px · 5 of 64 slices shown (3 of 4)]
[im 1/64]
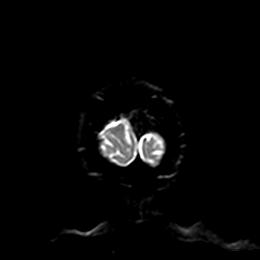
[im 16/64]
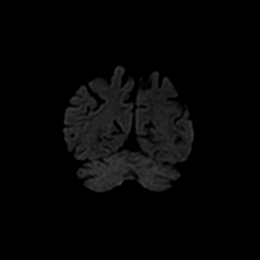
[im 32/64]
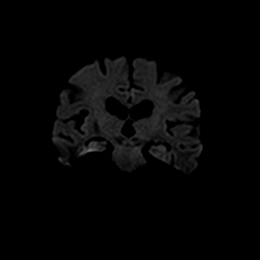
[im 48/64]
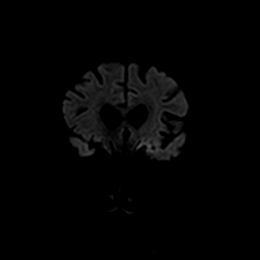
[im 64/64]
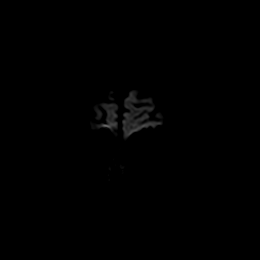

[Series 8: DWI · coronal · 4.0mm · 0.88mm/px · 3 of 32 slices shown (4 of 4)]
[im 1/32]
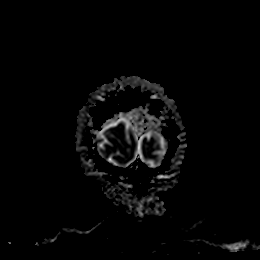
[im 16/32]
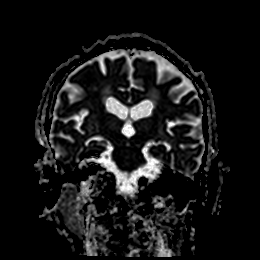
[im 32/32]
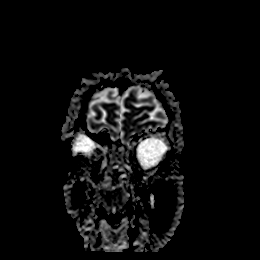

[Series 9: FLAIR · axial · 5.0mm · 0.45mm/px · z∈[-51,+86]mm · 2 of 25 slices shown]
[im 1/25]
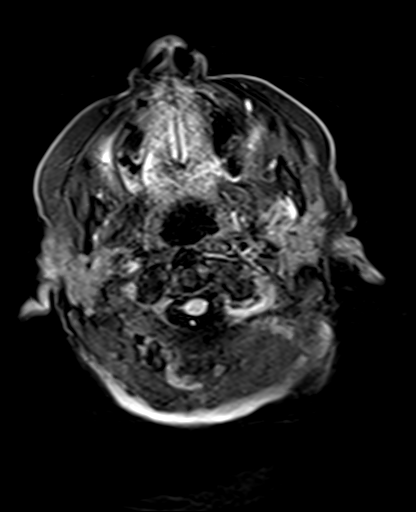
[im 25/25]
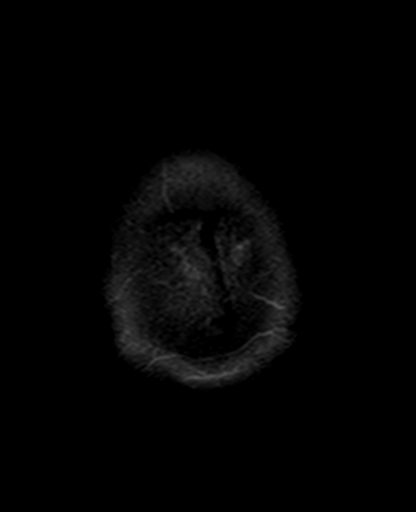

[Series 10: T2 · axial · 5.0mm · 0.72mm/px · z∈[-51,+87]mm · 2 of 25 slices shown]
[im 1/25]
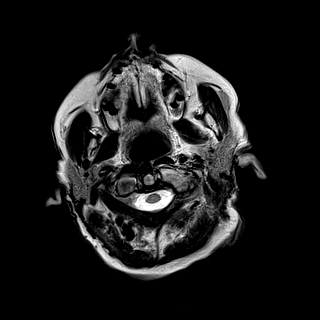
[im 25/25]
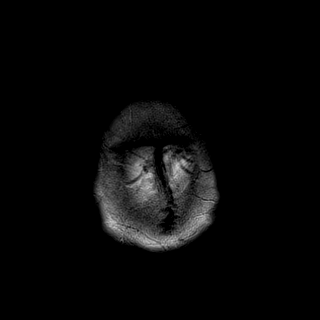

[Series 11: T1 · sagittal · 5.0mm · 0.75mm/px · 2 of 23 slices shown]
[im 1/23]
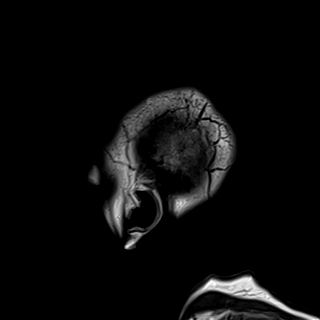
[im 23/23]
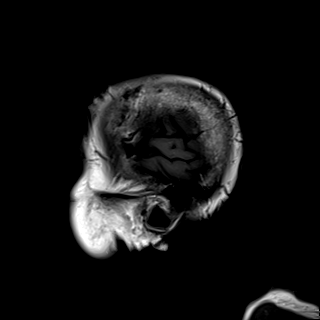

[Series 12: mag_images · axial · 3.0mm · 0.90mm/px · z∈[-56,+90]mm · 4 of 52 slices shown]
[im 1/52]
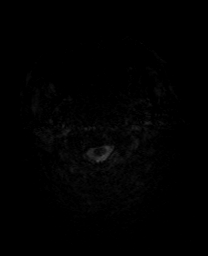
[im 18/52]
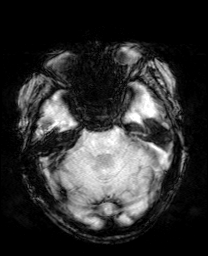
[im 35/52]
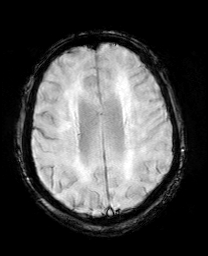
[im 52/52]
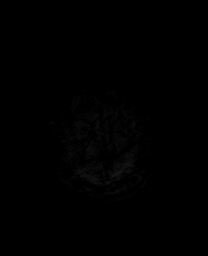

[Series 13: pha_images · axial · 3.0mm · 0.90mm/px · z∈[-54,+84]mm · 4 of 49 slices shown]
[im 1/49]
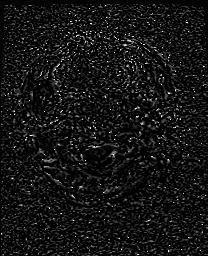
[im 17/49]
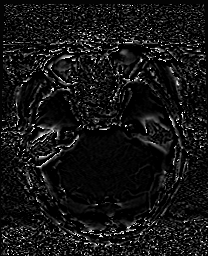
[im 33/49]
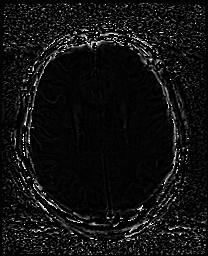
[im 49/49]
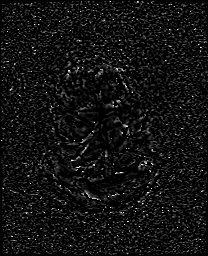

[Series 14: swi_images · axial · 3.0mm · 0.90mm/px · z∈[-56,+90]mm · 4 of 52 slices shown]
[im 1/52]
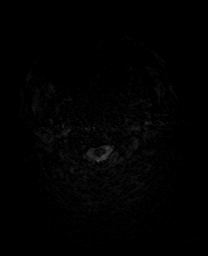
[im 18/52]
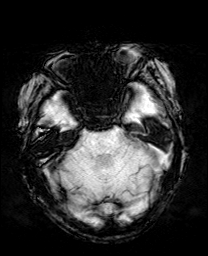
[im 35/52]
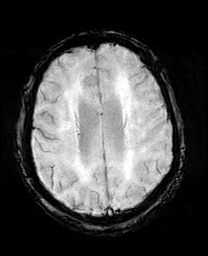
[im 52/52]
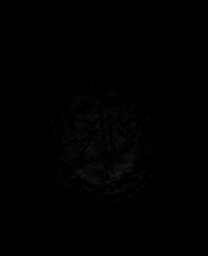

[Series 15: mip_images(sw) · axial · 24.0mm · 0.90mm/px · z∈[-46,+80]mm · 4 of 45 slices shown]
[im 1/45]
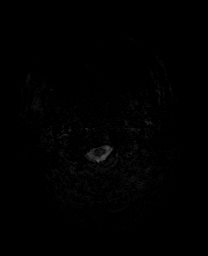
[im 15/45]
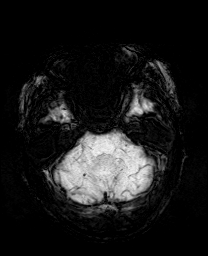
[im 30/45]
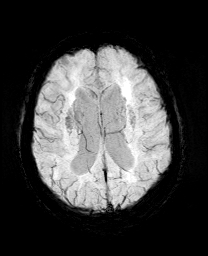
[im 45/45]
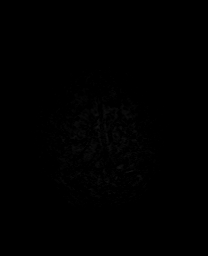

[Series 18: T2 post-contrast · coronal · 5.0mm · 0.72mm/px · 2 of 28 slices shown]
[im 1/28]
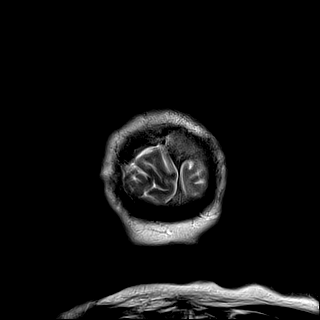
[im 28/28]
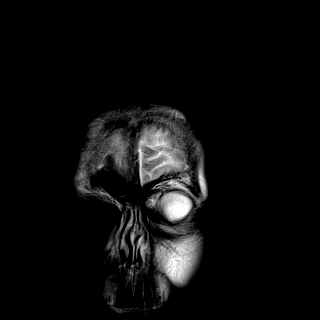

[44 of 48 positions shown; findings below may reference images not displayed]

FINDINGS: Brain: The only restricted diffusion identified is a punctate area
of the left posterior cingulate gyrus best seen on series 5, image
82, but also on coronal diffusion series 7, image 49.

Widespread and confluent bilateral cerebral white matter T2 and
FLAIR hyperintensity, most resembling chronic small vessel ischemia
in the bilateral corona radiata on series 10, image 17.
Comparatively mild T2 heterogeneity in the deep gray nuclei. No
chronic cerebral hemisphere blood products although there is
evidence of a chronic microhemorrhage in the right cerebellum deep
cerebellar nuclei series 14, image 15. No cortical encephalomalacia
identified.

No midline shift, mass effect, evidence of mass lesion,
ventriculomegaly, extra-axial collection or acute intracranial
hemorrhage. Cervicomedullary junction and pituitary are within
normal limits.

Vascular: Major intracranial vascular flow voids are preserved.

Skull and upper cervical spine: Negative for age visible cervical
spine. Visualized bone marrow signal is within normal limits.

Sinuses/Orbits: Postoperative changes to both globes otherwise
negative orbits. Paranasal Visualized paranasal sinuses and mastoids
are stable and well pneumatized.

Other: Visible internal auditory structures appear normal.
IMPRESSION: 1. Punctate acute lacunar infarct in the posterior left cingulate
gyrus, perhaps reflecting asymptomatic small vessel disease in this
clinical setting. No associated hemorrhage or mass effect.
2. No other acute intracranial abnormality. Underlying advanced
chronic small vessel ischemia.

Study discussed by telephone with Dr. RAHIMOVA on [DATE] at
[2Q] hours.

## 2019-07-31 IMAGING — CT CT HEAD CODE STROKE
4 series · 16 of 47 positions shown, 18 images · non-contrast
Comparison: [DATE] head CT.

CLINICAL DATA: Code stroke. [AGE] female with left side drift
and slurred speech.

EXAM:
CT HEAD WITHOUT CONTRAST
TECHNIQUE: Contiguous axial images were obtained from the base of the skull
through the vertex without intravenous contrast.

[Series 2: head wo · axial · 0.43mm/px · z∈[+1098,+1218]mm · 7 of 34 slices shown, 9 images]
[im 5/34  brain]
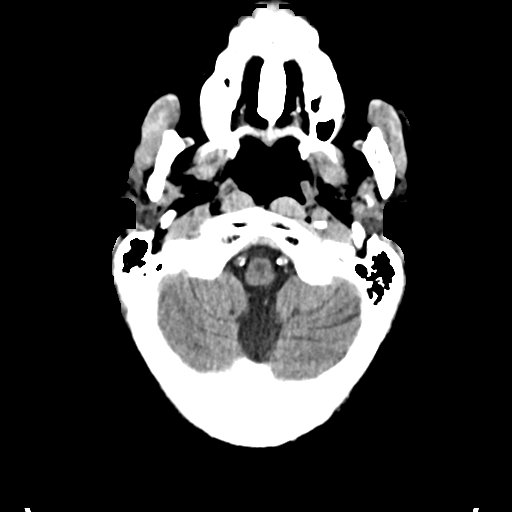
[im 5/34  bone]
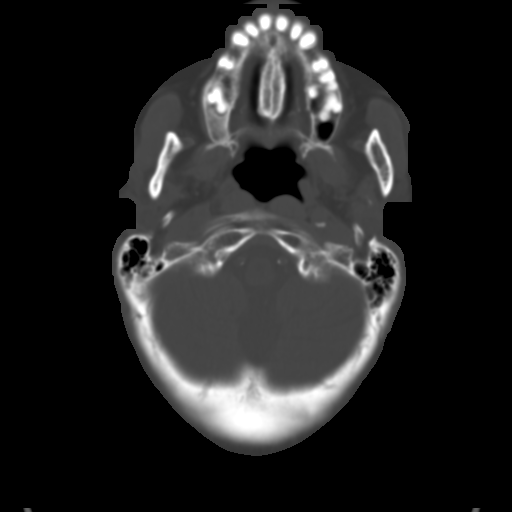
[im 9/34  brain]
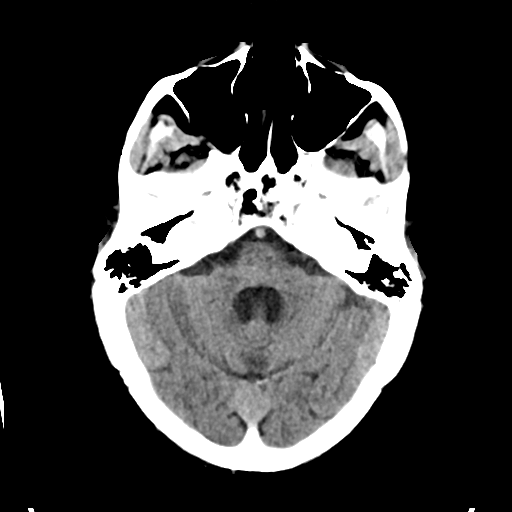
[im 13/34  brain]
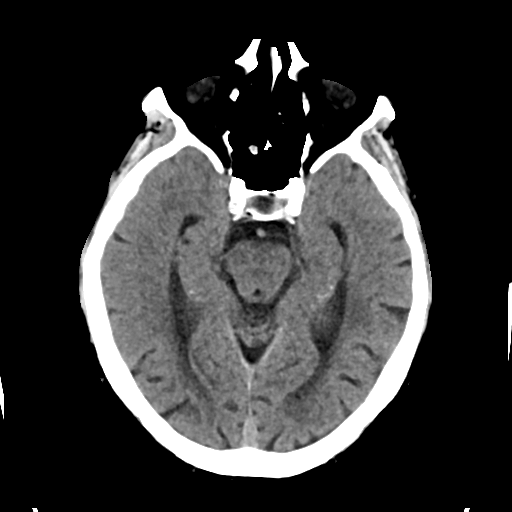
[im 17/34  brain]
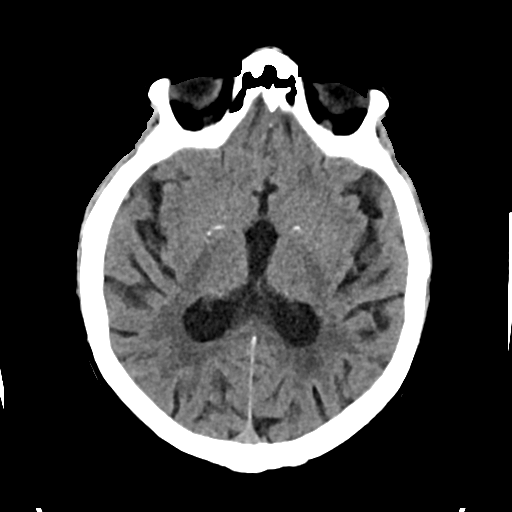
[im 21/34  brain]
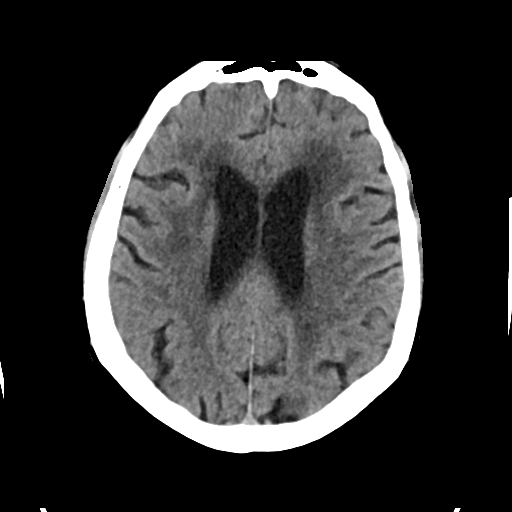
[im 21/34  bone]
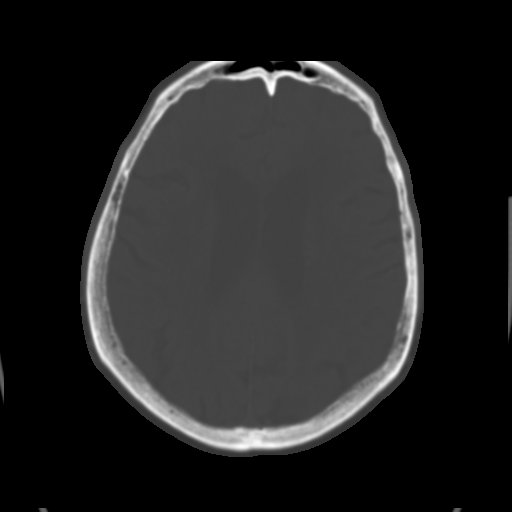
[im 25/34  brain]
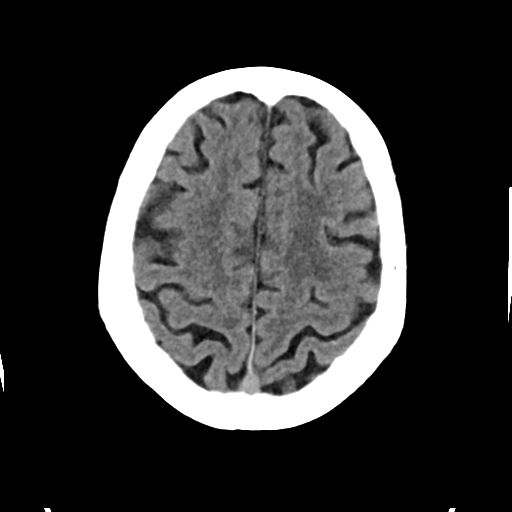
[im 29/34  brain]
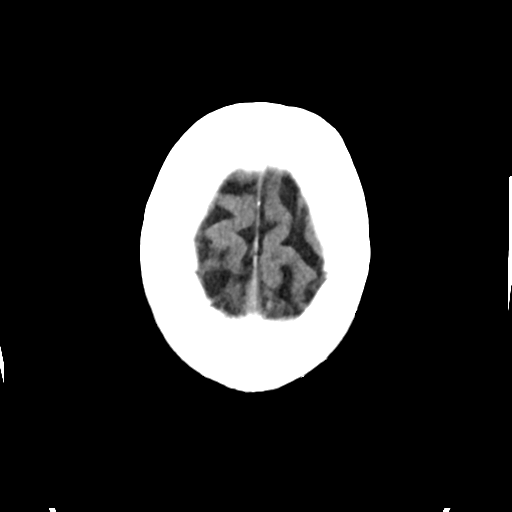

[Series 3: head bone · axial · 0.43mm/px · z∈[+1094,+1126]mm · 3 of 84 slices shown]
[im 9/84  bone]
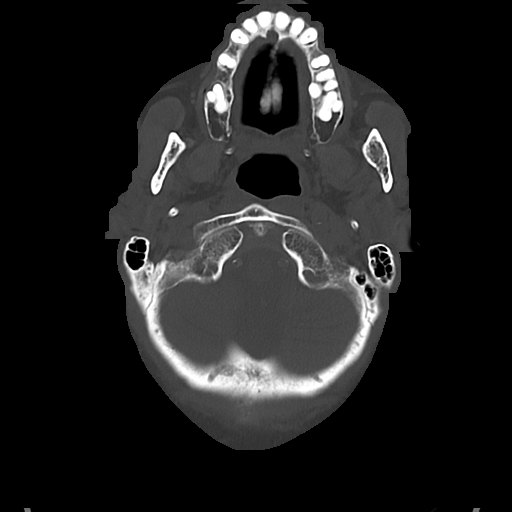
[im 17/84  bone]
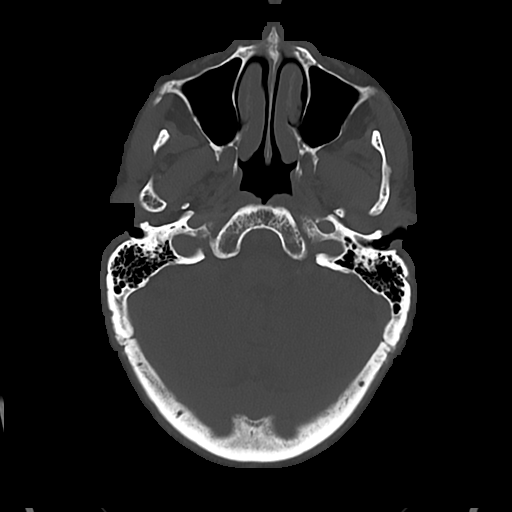
[im 25/84  bone]
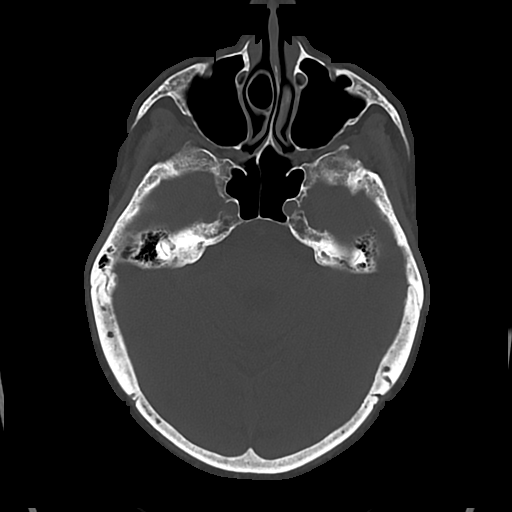

[Series 4: cor soft · coronal · 0.35mm/px · 3 of 72 slices shown]
[im 24/72  brain]
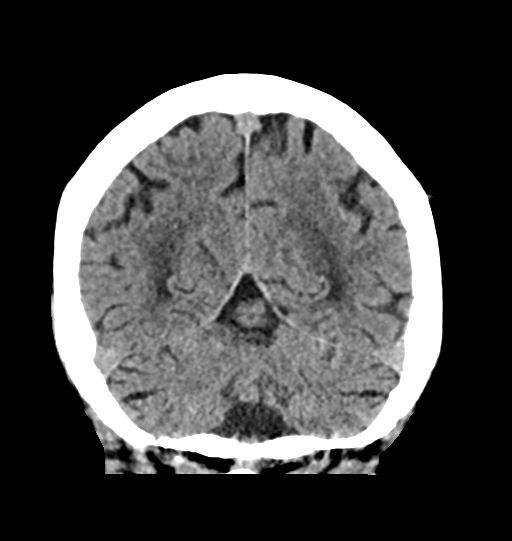
[im 32/72  brain]
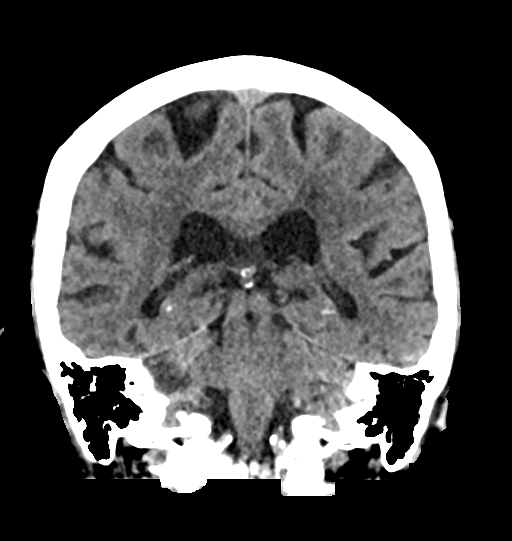
[im 40/72  brain]
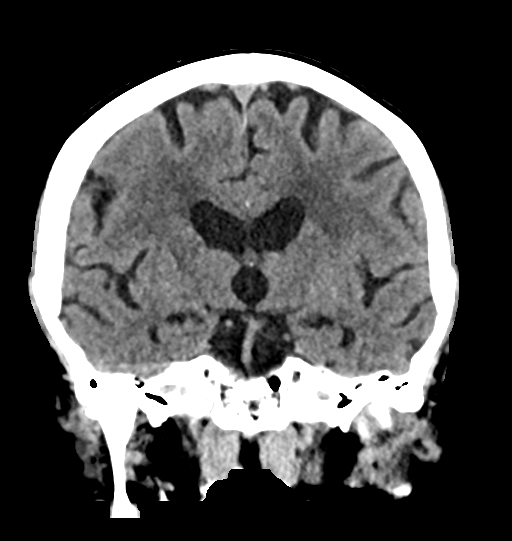

[Series 5: sag soft · sagittal · 0.37mm/px · 3 of 60 slices shown]
[im 20/60  brain]
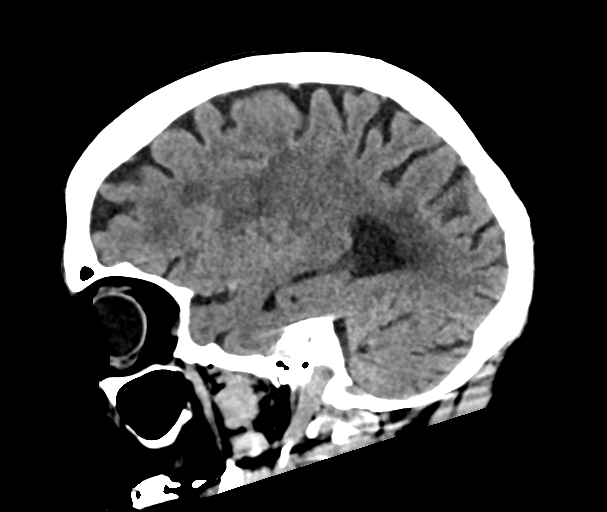
[im 30/60  brain]
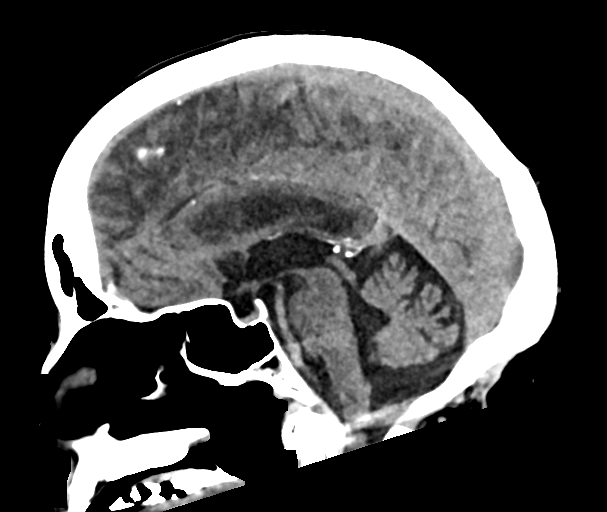
[im 40/60  brain]
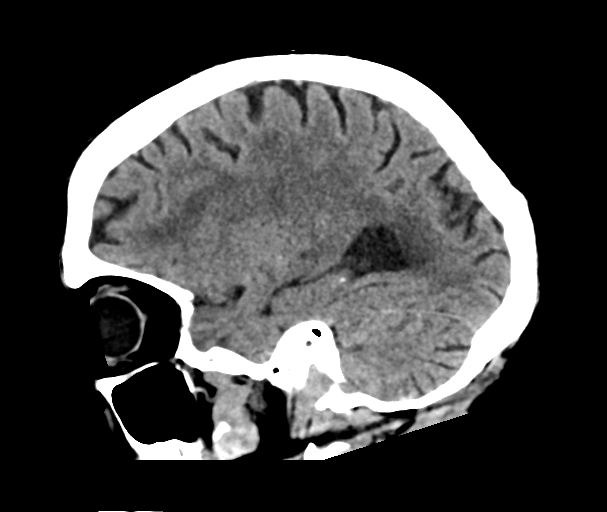

[16 of 47 positions shown; findings below may reference images not displayed]

FINDINGS: Brain: No acute intracranial hemorrhage identified. No midline
shift, mass effect, or evidence of intracranial mass lesion. Stable
cerebral volume. No ventriculomegaly.

Patchy and confluent bilateral white matter hypodensity appears
stable. No cortically based acute infarct identified. Deep gray
nuclei remain relatively spared. Posterior fossa appears stable and
negative.

Vascular: Extensive Calcified atherosclerosis at the skull base.
Severe chronic calcified plaque in the proximal right MCA. No
suspicious intracranial vascular hyperdensity.

Skull: No acute osseous abnormality identified.

Sinuses/Orbits: Visualized paranasal sinuses and mastoids are stable
and well pneumatized.

Other: No acute orbit or scalp soft tissue findings.

ASPECTS ([place] Stroke Program Early CT Score)

Total score (0-10 with 10 being normal): 10
IMPRESSION: 1. Stable non contrast CT appearance of the brain. No acute
cortically based infarct or acute intracranial hemorrhage
identified. ASPECTS 10.
2. Severe chronic intracranial atherosclerosis, including right M1
involvement. Chronic white matter small vessel disease.
3. These results were communicated to Dr. SCHWARZ at [DATE] pmon
[DATE]by text page via the AMION messaging system.

## 2019-07-31 MED ORDER — LATANOPROST 0.005 % OP SOLN
1.0000 [drp] | Freq: Every day | OPHTHALMIC | Status: DC
  Administered 2019-08-01: 1 [drp] via OPHTHALMIC
  Filled 2019-07-31: qty 2.5

## 2019-07-31 MED ORDER — VALSARTAN 80 MG PO TABS
40.0000 mg | ORAL_TABLET | Freq: Every day | ORAL | Status: DC
  Filled 2019-07-31: qty 0.5

## 2019-07-31 MED ORDER — ADULT MULTIVITAMIN W/MINERALS CH
1.0000 | ORAL_TABLET | Freq: Every day | ORAL | Status: DC
  Administered 2019-08-01 – 2019-08-02 (×2): 1 via ORAL
  Filled 2019-07-31 (×2): qty 1

## 2019-07-31 MED ORDER — HYDRALAZINE HCL 20 MG/ML IJ SOLN: 5.0000 mg | INTRAMUSCULAR | Status: DC | PRN

## 2019-07-31 MED ORDER — LABETALOL HCL 5 MG/ML IV SOLN
10.0000 mg | Freq: Once | INTRAVENOUS | Status: AC
  Administered 2019-07-31: 10 mg via INTRAVENOUS
  Filled 2019-07-31: qty 4

## 2019-07-31 MED ORDER — ENOXAPARIN SODIUM 40 MG/0.4ML ~~LOC~~ SOLN
40.0000 mg | SUBCUTANEOUS | Status: DC
  Administered 2019-07-31 – 2019-08-01 (×2): 40 mg via SUBCUTANEOUS
  Filled 2019-07-31 (×2): qty 0.4

## 2019-07-31 MED ORDER — SODIUM CHLORIDE 0.9 % IV SOLN: INTRAVENOUS | Status: DC

## 2019-07-31 MED ORDER — SODIUM CHLORIDE 0.9% FLUSH: 3.0000 mL | Freq: Once | INTRAVENOUS | Status: DC

## 2019-07-31 MED ORDER — ASPIRIN EC 81 MG PO TBEC
81.0000 mg | DELAYED_RELEASE_TABLET | Freq: Every day | ORAL | Status: DC
  Administered 2019-08-01 – 2019-08-02 (×2): 81 mg via ORAL
  Filled 2019-07-31 (×2): qty 1

## 2019-07-31 MED ORDER — SODIUM CHLORIDE 0.9 % IV SOLN: 250.0000 mL | INTRAVENOUS | Status: DC | PRN

## 2019-07-31 MED ORDER — SODIUM CHLORIDE 0.9% FLUSH: 3.0000 mL | INTRAVENOUS | Status: DC | PRN

## 2019-07-31 MED ORDER — HYDROCHLOROTHIAZIDE 10 MG/ML ORAL SUSPENSION
6.2500 mg | Freq: Every day | ORAL | Status: DC
  Administered 2019-08-01 – 2019-08-02 (×2): 6.25 mg via ORAL
  Filled 2019-07-31 (×2): qty 1.25

## 2019-07-31 MED ORDER — ONDANSETRON HCL 4 MG/2ML IJ SOLN: 4.0000 mg | Freq: Four times a day (QID) | INTRAMUSCULAR | Status: DC | PRN

## 2019-07-31 MED ORDER — SODIUM CHLORIDE 0.9% FLUSH
3.0000 mL | Freq: Two times a day (BID) | INTRAVENOUS | Status: DC
  Administered 2019-07-31 – 2019-08-02 (×3): 3 mL via INTRAVENOUS

## 2019-07-31 MED ORDER — SODIUM CHLORIDE 0.9% FLUSH
3.0000 mL | Freq: Two times a day (BID) | INTRAVENOUS | Status: DC
  Administered 2019-08-02: 3 mL via INTRAVENOUS

## 2019-07-31 MED ORDER — INSULIN ASPART 100 UNIT/ML ~~LOC~~ SOLN
0.0000 [IU] | SUBCUTANEOUS | Status: DC
  Administered 2019-07-31 – 2019-08-01 (×2): 2 [IU] via SUBCUTANEOUS
  Administered 2019-08-01: 1 [IU] via SUBCUTANEOUS
  Administered 2019-08-01: 2 [IU] via SUBCUTANEOUS
  Administered 2019-08-01 (×2): 1 [IU] via SUBCUTANEOUS
  Administered 2019-08-01: 3 [IU] via SUBCUTANEOUS
  Administered 2019-08-02 (×2): 1 [IU] via SUBCUTANEOUS

## 2019-07-31 MED ORDER — VITAMIN D 25 MCG (1000 UNIT) PO TABS
5000.0000 [IU] | ORAL_TABLET | Freq: Every day | ORAL | Status: DC
  Administered 2019-08-01: 5000 [IU] via ORAL
  Filled 2019-07-31: qty 5

## 2019-07-31 MED ORDER — ONDANSETRON HCL 4 MG PO TABS: 4.0000 mg | ORAL_TABLET | Freq: Four times a day (QID) | ORAL | Status: DC | PRN

## 2019-07-31 NOTE — ED Provider Notes (Signed)
[facility] EMERGENCY DEPARTMENT Provider Note   CSN: [id number] Arrival date & time: 07/31/19  1746  An emergency department physician performed an initial assessment on this suspected stroke patient at 1748.  History Chief Complaint  Patient presents with  . Code Stroke    Suzanne Cantu is a 86 y.o. female.  HPI 86 year old female presenting to the ED for confusion and left-sided weakness.  Per report, patient was having slurred speech and left-sided weakness that began around 4 PM this afternoon.  Approximately 2 hours prior to presentation.  No fevers or recent chills, no recent illnesses, patient has history of hypertension and diabetes, code stroke was at activated on arrival patient was protecting her airway, hypertensive in the low 200s systolic, patient did have mild left-sided droop noticed as well as some mild left-sided weakness.  No chest pain or shortness of breath, no abdominal pain or vomiting, no nausea, no urinary symptoms recently.    Past Medical History:  Diagnosis Date  . Diabetes mellitus without complication (HCC)   . Hypertension     Patient Active Problem List   Diagnosis Date Noted  . Encephalopathy acute 07/31/2019  . Hypertensive urgency 07/31/2019  . Essential hypertension 07/31/2019  . Type 2 diabetes mellitus with other specified complication (HCC) 07/31/2019  . Chronic back pain 07/31/2019  . Hypertensive emergency 07/31/2019  . Acute encephalopathy 07/31/2019    Past Surgical History:  Procedure Laterality Date  . ABDOMINAL HYSTERECTOMY    . CESAREAN SECTION     4  . CHOLECYSTECTOMY    . KIDNEY STONE SURGERY       OB History   No obstetric history on file.     No family history on file.  Social History   Tobacco Use  . Smoking status: Never Smoker  . Smokeless tobacco: Never Used  Substance Use Topics  . Alcohol use: Never  . Drug use: Not on file    Home Medications Prior to Admission medications    Medication Sig Start Date End Date Taking? Authorizing Provider  aspirin EC 81 MG tablet Take 81 mg by mouth daily.   Yes [provider]  bimatoprost (LUMIGAN) 0.01 % SOLN Place 1 drop into both eyes at bedtime.   Yes [provider]  Cholecalciferol (VITAMIN D-3 PO) Take 1 capsule by mouth daily after supper.   Yes [provider]  Docusate Sodium 100 MG capsule Take 100 mg by mouth every evening.   Yes [provider]  ibuprofen (ADVIL) 200 MG tablet Take 200 mg by mouth every 6 (six) hours as needed for headache, mild pain or moderate pain.    Yes [provider]  metFORMIN (GLUCOPHAGE) 500 MG tablet Take 500 mg by mouth 2 (two) times daily with a meal.    Yes [provider]  Multiple Vitamins-Minerals (PRESERVISION AREDS 2) CAPS Take 1 capsule by mouth 2 (two) times daily.   Yes [provider]  valsartan-hydrochlorothiazide (DIOVAN-HCT) 80-12.5 MG tablet Take 0.5 tablets by mouth daily.    Yes [provider]    Allergies    Metaxalone  Review of Systems   Review of Systems  Unable to perform ROS: Acuity of condition    Physical Exam Updated Vital Signs BP (!) 184/77   Pulse 81   Resp 16   Wt 54 kg   SpO2 98%   BMI 21.09 kg/m   Physical Exam Vitals and nursing note reviewed.  Constitutional:  General: She is not in acute distress.    Appearance: She is well-developed. She is not ill-appearing, toxic-appearing or diaphoretic.  HENT:     Head: Normocephalic and atraumatic.     Right Ear: External ear normal.     Left Ear: External ear normal.     Nose: Nose normal. No congestion.     Mouth/Throat:     Mouth: Mucous membranes are moist.     Pharynx: Oropharynx is clear.  Eyes:     Conjunctiva/sclera: Conjunctivae normal.  Cardiovascular:     Rate and Rhythm: Normal rate and regular rhythm.     Heart sounds: No murmur.  Pulmonary:     Effort: Pulmonary effort is normal. No respiratory  distress.     Breath sounds: Normal breath sounds.  Abdominal:     General: There is no distension.     Palpations: Abdomen is soft. There is no mass.     Tenderness: There is no abdominal tenderness.     Hernia: No hernia is present.  Musculoskeletal:        General: No swelling or tenderness.     Cervical back: Normal range of motion and neck supple.  Skin:    General: Skin is warm and dry.     Capillary Refill: Capillary refill takes less than 2 seconds.  Neurological:     Mental Status: She is alert. She is disoriented.     Cranial Nerves: Cranial nerve deficit present.     Sensory: No sensory deficit.     Motor: Weakness present.     Coordination: Coordination normal.     Gait: Gait normal.     Deep Tendon Reflexes: Reflexes normal.     Comments: Left sided facial droop Mild left sided UE weakness, mild drift noted   Psychiatric:        Mood and Affect: Mood normal.        Behavior: Behavior normal.     ED Results / Procedures / Treatments   Labs (all labs ordered are listed, but only abnormal results are displayed) Labs Reviewed  APTT - Abnormal; Notable for the following components:      Result Value   aPTT 22 (*)    All other components within normal limits  COMPREHENSIVE METABOLIC PANEL - Abnormal; Notable for the following components:   Sodium 130 (*)    Chloride 91 (*)    Glucose, Bld 151 (*)    Total Bilirubin 0.2 (*)    All other components within normal limits  I-STAT CHEM 8, ED - Abnormal; Notable for the following components:   Sodium 129 (*)    Chloride 91 (*)    Glucose, Bld 149 (*)    Calcium, Ion 1.10 (*)    All other components within normal limits  SARS CORONAVIRUS 2 (TAT 6-24 HRS)  PROTIME-INR  CBC  DIFFERENTIAL  BASIC METABOLIC PANEL  MAGNESIUM  CBC  HEMOGLOBIN A1C  CBG MONITORING, ED    EKG EKG Interpretation  Date/Time:  Thursday July 31 2019 18:54:37 EST Ventricular Rate:  83 PR Interval:    QRS Duration: 93 QT  Interval:  376 QTC Calculation: 442 R Axis:   -32 Text Interpretation: Ectopic atrial rhythm Abnormal R-wave progression, early transition Probable LVH with secondary repol abnrm No acute changes No significant change since last tracing Confirmed by Derwood Kaplan [id number]) on 07/31/2019 7:15:34 PM   Radiology MR BRAIN WO CONTRAST  Result Date: 07/31/2019 CLINICAL DATA:  86 year old female code stroke presentation,  left side weakness. Although clinical exam more suggestive of hypertensive urgency/emergency. EXAM: MRI HEAD WITHOUT CONTRAST TECHNIQUE: Multiplanar, multiecho pulse sequences of the brain and surrounding structures were obtained without intravenous contrast. COMPARISON:  Head CT 1759 hours today. FINDINGS: Brain: The only restricted diffusion identified is a punctate area of the left posterior cingulate gyrus best seen on series 5, image 82, but also on coronal diffusion series 7, image 49. Widespread and confluent bilateral cerebral white matter T2 and FLAIR hyperintensity, most resembling chronic small vessel ischemia in the bilateral corona radiata on series 10, image 17. Comparatively mild T2 heterogeneity in the deep gray nuclei. No chronic cerebral hemisphere blood products although there is evidence of a chronic microhemorrhage in the right cerebellum deep cerebellar nuclei series 14, image 15. No cortical encephalomalacia identified. No midline shift, mass effect, evidence of mass lesion, ventriculomegaly, extra-axial collection or acute intracranial hemorrhage. Cervicomedullary junction and pituitary are within normal limits. Vascular: Major intracranial vascular flow voids are preserved. Skull and upper cervical spine: Negative for age visible cervical spine. Visualized bone marrow signal is within normal limits. Sinuses/Orbits: Postoperative changes to both globes otherwise negative orbits. Paranasal Visualized paranasal sinuses and mastoids are stable and well pneumatized. Other:  Visible internal auditory structures appear normal. IMPRESSION: 1. Punctate acute lacunar infarct in the posterior left cingulate gyrus, perhaps reflecting asymptomatic small vessel disease in this clinical setting. No associated hemorrhage or mass effect. 2. No other acute intracranial abnormality. Underlying advanced chronic small vessel ischemia. Study discussed by telephone with Dr. Amie Portland on 07/31/2019 at [place] hours. Electronically Signed   By: Genevie Ann M.D.   On: 07/31/2019 19:02   CT HEAD CODE STROKE WO CONTRAST  Result Date: 07/31/2019 CLINICAL DATA:  Code stroke. 86 year old female with left side drift and slurred speech. EXAM: CT HEAD WITHOUT CONTRAST TECHNIQUE: Contiguous axial images were obtained from the base of the skull through the vertex without intravenous contrast. COMPARISON:  03/12/2019 head CT. FINDINGS: Brain: No acute intracranial hemorrhage identified. No midline shift, mass effect, or evidence of intracranial mass lesion. Stable cerebral volume. No ventriculomegaly. Patchy and confluent bilateral white matter hypodensity appears stable. No cortically based acute infarct identified. Deep gray nuclei remain relatively spared. Posterior fossa appears stable and negative. Vascular: Extensive Calcified atherosclerosis at the skull base. Severe chronic calcified plaque in the proximal right MCA. No suspicious intracranial vascular hyperdensity. Skull: No acute osseous abnormality identified. Sinuses/Orbits: Visualized paranasal sinuses and mastoids are stable and well pneumatized. Other: No acute orbit or scalp soft tissue findings. ASPECTS [facility] Stroke Program Early CT Score) Total score (0-10 with 10 being normal): 10 IMPRESSION: 1. Stable non contrast CT appearance of the brain. No acute cortically based infarct or acute intracranial hemorrhage identified. ASPECTS 10. 2. Severe chronic intracranial atherosclerosis, including right M1 involvement. Chronic white matter small vessel  disease. 3. These results were communicated to Dr. Rory Percy at 6:05 pmon 1/28/2021by text page via the [facility] messaging system. Electronically Signed   By: Genevie Ann M.D.   On: 07/31/2019 18:06    Procedures Procedures (including critical care time)  Medications Ordered in ED Medications  sodium chloride flush (NS) 0.9 % injection 3 mL (3 mLs Intravenous Not Given 07/31/19 1954)  aspirin EC tablet 81 mg (has no administration in time range)  valsartan-hydrochlorothiazide (DIOVAN-HCT) 80-12.5 MG per tablet 0.5 tablet (has no administration in time range)  Vitamin D-3 TABS (has no administration in time range)  latanoprost (XALATAN) 0.005 % ophthalmic solution 1 drop (has  no administration in time range)  enoxaparin (LOVENOX) injection 30 mg (has no administration in time range)  sodium chloride flush (NS) 0.9 % injection 3 mL (has no administration in time range)  sodium chloride flush (NS) 0.9 % injection 3 mL (has no administration in time range)  sodium chloride flush (NS) 0.9 % injection 3 mL (has no administration in time range)  0.9 %  sodium chloride infusion (has no administration in time range)  0.9 %  sodium chloride infusion (has no administration in time range)  ondansetron (ZOFRAN) tablet 4 mg (has no administration in time range)    Or  ondansetron (ZOFRAN) injection 4 mg (has no administration in time range)  multivitamin with minerals tablet 1 tablet (has no administration in time range)  insulin aspart (novoLOG) injection 0-9 Units (has no administration in time range)  hydrALAZINE (APRESOLINE) injection 5 mg (has no administration in time range)  labetalol (NORMODYNE) injection 10 mg (10 mg Intravenous Given 07/31/19 1953)    ED Course  I have reviewed the triage vital signs and the nursing notes.  Pertinent labs & imaging results that were available during my care of the patient were reviewed by me and considered in my medical decision making (see chart for details).     MDM Rules/Calculators/A&P                      86 year old female presenting to the ED as a code stroke secondary to left-sided weakness and confusion.  On arrival patient was hemodynamically stable, afebrile, see physical exam.  Patient was hypertensive in the low 200s systolic.  Neurology was bedside as well for the code stroke, MRI was obtained which revealed a small lacunar infarct that is likely incidental, likely not related to patient's presentation.  Patient's deficits did improve somewhat in the ED, likely toxic metabolic encephalopathy,/hypertensive emergency.  Patient given labetalol for her high blood pressure.  Patient will be admitted to the hospitalist for further evaluation of her encephalopathy.  No need for TPA or thrombectomy at this time, patient otherwise appears well.   Hyponatremic to 129, however patient's baseline is around 130, otherwise CBC reassuring, no other abnormalities noted on her labs.  Admitted in stable condition.  The attending physician was present and available for all medical decision making and procedures related to this patient's care.  Final Clinical Impression(s) / ED Diagnoses Final diagnoses:  Hypertensive encephalopathy    Rx / DC Orders ED Discharge Orders    None       Jamey Reas, MD 07/31/19 2151    Derwood Kaplan, MD 08/01/19 1609

## 2019-07-31 NOTE — Progress Notes (Signed)
EEG complete - results pending 

## 2019-07-31 NOTE — ED Notes (Signed)
EEG tech at bedside performing EEG at this time.

## 2019-07-31 NOTE — Consult Note (Signed)
Neurology Consultation  Reason for Consult: Code stroke Referring Physician: Dr. Rhunette Croft  CC: Confusion, left-sided weakness, slurred speech  History is obtained from: EMS, patient's daughter over the phone  HPI: Suzanne Cantu is a 86 y.o. female past medical history of diabetes, hypertension, MCI, presenting for evaluation of confusion and left-sided weakness. According to the family, she was at home, went to take a nap at 3 PM, and when she woke up around 4, was noted that she was acting confused.  She could not comprehend what the family was saying.  She appeared to be staring off in space. Upon asking to raise her arms, she would only raise her right arm and would not place her left arm at that time per her daughter. EMS evaluated her and noticed some left-sided weakness and aphasia. Code stroke was activated and she was brought in for evaluation. On the bridge, she appears slow to respond to questions and the exam is documented below. She had had a similar episode in September and once before that also. Due to the stereotypic nature of the episodes and low NIH stroke scale, TPA was not pursued right away and she was taken for stat MRI.   LKW: 3PM tpa given?: no, non focal sx Premorbid modified Rankin scale (mRS): 3  ROS:ROS was performed and is negative except as noted in the HPI.   Past Medical History:  Diagnosis Date  . Diabetes mellitus without complication (HCC)   . Hypertension    No family history on file.   Social History:   reports that she has never smoked. She has never used smokeless tobacco. She reports that she does not drink alcohol. No history on file for drug.  Medications  Current Facility-Administered Medications:  .  sodium chloride flush (NS) 0.9 % injection 3 mL, 3 mL, Intravenous, Once, Nanavati, Ankit, MD  Current Outpatient Medications:  .  aspirin EC 81 MG tablet, Take 81 mg by mouth daily., Disp: , Rfl:  .  bimatoprost (LUMIGAN) 0.01 %  SOLN, Place 1 drop into both eyes at bedtime., Disp: , Rfl:  .  ibuprofen (ADVIL) 200 MG tablet, Take 200 mg by mouth every 6 (six) hours as needed for moderate pain., Disp: , Rfl:  .  metaxalone (SKELAXIN) 400 MG tablet, Take 400 mg by mouth 3 (three) times daily., Disp: , Rfl:  .  metFORMIN (GLUCOPHAGE) 500 MG tablet, Take 500 mg by mouth daily with breakfast. , Disp: , Rfl:  .  valsartan-hydrochlorothiazide (DIOVAN-HCT) 160-12.5 MG tablet, Take 1 tablet by mouth daily., Disp: , Rfl:    Exam: Current vital signs: BP (!) 209/91   Pulse 79   Wt 54 kg   SpO2 98%   BMI 21.09 kg/m  Vital signs in last 24 hours: Pulse Rate:  [79] 79 (01/28 1812) BP: (209)/(91) 209/91 (01/28 1811) SpO2:  [98 %] 98 % (01/28 1812) Weight:  [54 kg] 54 kg (01/28 1700) General: Awake alert in no distress HEENT: Normocephalic atraumatic Lungs: Clear to auscultation Cardiovascular: Regular rhythm Abdomen: Soft nondistended nontender Extremities warm perfused Neurological exam Patient is awake, alert, oriented to self and place. Her speech is mildly dysarthric. Her attention concentration is extremely poor. Naming is intact She follows all commands Cranial nerves: Pupils are equal round react light, extraocular movements appear intact, there is questionable less blink to threat from the left, normal blink to threat from the right, face symmetric, tongue and palate midline. Motor exam: She is able to raise both  upper limbs against gravity without drift but is unable to hold the left arm straight in front of her-always try to take it above her head to hold it antigravity.  Both lower extremities are antigravity without drift. Sensory exam: Reports decreased sensation in the left leg due to her chronic phlebitis. Coordination: No ataxia Gait testing deferred  NIHSS 1a Level of Conscious.: 0 1b LOC Questions: 0 1c LOC Commands:0  2 Best Gaze: 0 3 Visual: 1 4 Facial Palsy: 0 5a Motor Arm - left: 1 5b  Motor Arm - Right: 0 6a Motor Leg - Left: 0 6b Motor Leg - Right: 0 7 Limb Ataxia: 0 8 Sensory: 1 9 Best Language: 0 10 Dysarthria: 1 11 Extinct. and Inatten.: 0 TOTAL: 4  Labs I have reviewed labs in epic and the results pertinent to this consultation are:  CBC    Component Value Date/Time   WBC 7.5 03/12/2019 1920   RBC 4.61 03/12/2019 1920   HGB 14.3 03/12/2019 1935   HCT 42.0 03/12/2019 1935   PLT 246 03/12/2019 1920   MCV 90.2 03/12/2019 1920   MCH 31.5 03/12/2019 1920   MCHC 34.9 03/12/2019 1920   RDW 12.1 03/12/2019 1920   LYMPHSABS 1.2 03/12/2019 1920   MONOABS 0.6 03/12/2019 1920   EOSABS 0.0 03/12/2019 1920   BASOSABS 0.0 03/12/2019 1920    CMP     Component Value Date/Time   NA 129 (L) 03/12/2019 1935   K 4.2 03/12/2019 1935   CL 91 (L) 03/12/2019 1920   CO2 26 03/12/2019 1920   GLUCOSE 169 (H) 03/12/2019 1920   BUN 16 03/12/2019 1920   CREATININE 0.58 03/12/2019 1920   CALCIUM 9.9 03/12/2019 1920   PROT 7.1 03/12/2019 1920   ALBUMIN 3.9 03/12/2019 1920   AST 22 03/12/2019 1920   ALT 16 03/12/2019 1920   ALKPHOS 47 03/12/2019 1920   BILITOT 0.7 03/12/2019 1920   GFRNONAA >60 03/12/2019 1920   GFRAA >60 03/12/2019 1920   Imaging I have reviewed the images obtained:  CT-scan of the brain-no acute changes.  Severe chronic white matter disease  MRI examination of the brain done stat-a very small punctate restricted diffusion high left frontal region-likely unrelated and incidental  Assessment: 86 year old past history of diabetes hypertension MCI presenting for evaluation of confusion left-sided weakness that started when she woke up from a nap around 4 PM today. Her exam is more consistent with encephalopathy done with a stroke. CT scan of the head shows no acute changes no bleed. MRI exam done stat shows a very small area of restricted diffusion-almost a punctate area in the high left frontal region which is likely unrelated and completely  incidental finding. I suspect her current presentation is secondary to hypertensive emergency versus toxic metabolic encephalopathy.  Due to the history of a similar episode more than once before, the stereotypic nature begs a question if this is a seizure given also she has a history of MCI.  Impression: -Stroke finding on MRI likely incidental and unrelated to current presentation. -Most likely toxic metabolic encephalopathy versus hypertensive emergency -Evaluate for seizure  Recommendations: #Would recommend observation admission at least #Manage blood pressures-bring down blood pressures by 20% every day for goal of normotension as he would for any hypertensive urgency. #I do not recommend doing a full stroke work-up including an echocardiogram etc. because I feel the stroke on her left frontal lobe is merely an incidental punctate area of restricted diffusion-not uncommon to see in  the setting of severe chronic white matter disease. #I would recommend obtaining an EEG because she has a history of stereotypical appearing events. #Would not start her on any antiepileptics for now. #Check B12, TSH, ammonia levels.  Check urinalysis and chest x-ray.  Neurology will follow with you  -- Milon Dikes, MD [facility] Pager: [phone] If 7pm to 7am, please call on call as listed on AMION.   CRITICAL CARE ATTESTATION Performed by: Milon Dikes, MD Total critical care time: 55 minutes Critical care time was exclusive of separately billable procedures and treating other patients and/or supervising APPs/Residents/Students Critical care was necessary to treat or prevent imminent or life-threatening deterioration due to hypertensive emergency, toxic metabolic encephalopathy This patient is critically ill and at significant risk for neurological worsening and/or death and care requires constant monitoring. Critical care was time spent personally by me on the following activities:  development of treatment plan with patient and/or surrogate as well as nursing, discussions with consultants, evaluation of patient's response to treatment, examination of patient, obtaining history from patient or surrogate, ordering and performing treatments and interventions, ordering and review of laboratory studies, ordering and review of radiographic studies, pulse oximetry, re-evaluation of patient's condition, participation in multidisciplinary rounds and medical decision making of high complexity in the care of this patient.

## 2019-07-31 NOTE — ED Triage Notes (Signed)
Pt BIB [facility] from home. Code stroke activated in the field. LSN 1600. Family noticed pt was having slurred speech and left sided weakness.

## 2019-07-31 NOTE — ED Notes (Signed)
Patient moved to hospital bed.  Placed in position of comfort.

## 2019-07-31 NOTE — Plan of Care (Signed)

## 2019-07-31 NOTE — Progress Notes (Signed)
Unable to complete admission questions due to pt is unable to answer questions appropriately at this time.

## 2019-07-31 NOTE — ED Notes (Signed)
Pt transported to MRI with this RN and [place], [place]

## 2019-08-01 ENCOUNTER — Inpatient Hospital Stay (HOSPITAL_COMMUNITY): Payer: Medicare Other

## 2019-08-01 DIAGNOSIS — I161 Hypertensive emergency: Secondary | ICD-10-CM

## 2019-08-01 DIAGNOSIS — G9340 Encephalopathy, unspecified: Secondary | ICD-10-CM

## 2019-08-01 LAB — URINALYSIS, ROUTINE W REFLEX MICROSCOPIC
Bilirubin Urine: NEGATIVE
Glucose, UA: NEGATIVE mg/dL
Hgb urine dipstick: NEGATIVE
Ketones, ur: 5 mg/dL — AB
Leukocytes,Ua: NEGATIVE
Nitrite: NEGATIVE
Protein, ur: NEGATIVE mg/dL
Specific Gravity, Urine: 1.011 (ref 1.005–1.030)
pH: 8 (ref 5.0–8.0)

## 2019-08-01 LAB — CBC
HCT: 38.5 % (ref 36.0–46.0)
Hemoglobin: 13.1 g/dL (ref 12.0–15.0)
MCH: 30.6 pg (ref 26.0–34.0)
MCHC: 34 g/dL (ref 30.0–36.0)
MCV: 90 fL (ref 80.0–100.0)
Platelets: 202 10*3/uL (ref 150–400)
RBC: 4.28 MIL/uL (ref 3.87–5.11)
RDW: 12.8 % (ref 11.5–15.5)
WBC: 7.3 10*3/uL (ref 4.0–10.5)
nRBC: 0 % (ref 0.0–0.2)

## 2019-08-01 LAB — BASIC METABOLIC PANEL
Anion gap: 10 (ref 5–15)
BUN: 7 mg/dL — ABNORMAL LOW (ref 8–23)
CO2: 29 mmol/L (ref 22–32)
Calcium: 9.4 mg/dL (ref 8.9–10.3)
Chloride: 92 mmol/L — ABNORMAL LOW (ref 98–111)
Creatinine, Ser: 0.69 mg/dL (ref 0.44–1.00)
GFR calc Af Amer: >60 mL/min (ref >60)
GFR calc non Af Amer: >60 mL/min (ref >60)
Glucose, Bld: 137 mg/dL — ABNORMAL HIGH (ref 70–99)
Potassium: 3.7 mmol/L (ref 3.5–5.1)
Sodium: 131 mmol/L — ABNORMAL LOW (ref 135–145)

## 2019-08-01 LAB — GLUCOSE, CAPILLARY
Glucose-Capillary: 122 mg/dL — ABNORMAL HIGH (ref 70–99)
Glucose-Capillary: 126 mg/dL — ABNORMAL HIGH (ref 70–99)
Glucose-Capillary: 134 mg/dL — ABNORMAL HIGH (ref 70–99)
Glucose-Capillary: 135 mg/dL — ABNORMAL HIGH (ref 70–99)
Glucose-Capillary: 144 mg/dL — ABNORMAL HIGH (ref 70–99)
Glucose-Capillary: 198 mg/dL — ABNORMAL HIGH (ref 70–99)
Glucose-Capillary: 212 mg/dL — ABNORMAL HIGH (ref 70–99)

## 2019-08-01 LAB — TSH: TSH: 2.468 u[IU]/mL (ref 0.350–4.500)

## 2019-08-01 LAB — MAGNESIUM: Magnesium: 1.5 mg/dL — ABNORMAL LOW (ref 1.7–2.4)

## 2019-08-01 LAB — VITAMIN B12: Vitamin B-12: 365 pg/mL (ref 180–914)

## 2019-08-01 IMAGING — DX DG CHEST 1V
1 series · 1 of 1 positions shown · non-contrast
Comparison: [DATE]

CLINICAL DATA: [AGE] female with a history of cough

EXAM:
CHEST  1 VIEW

[chest ap]
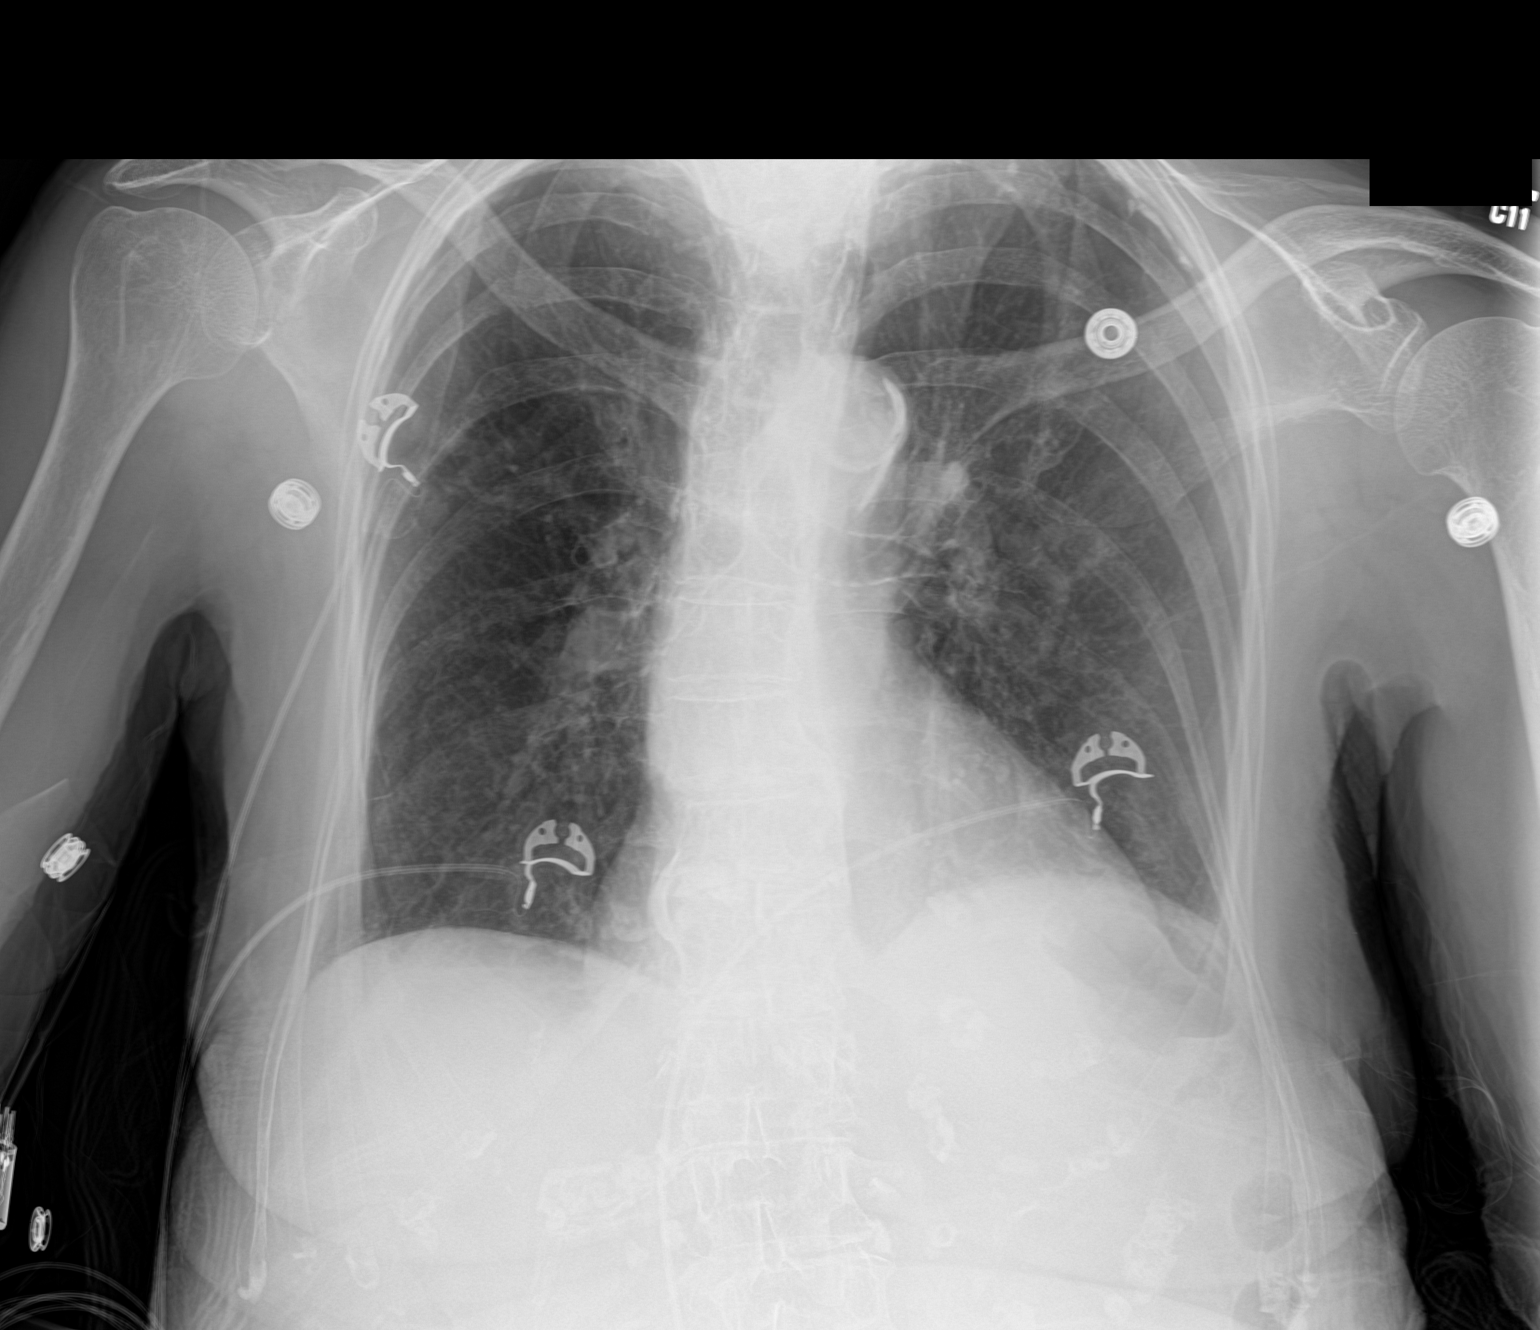

[1 of 1 positions shown; findings below may reference images not displayed]

FINDINGS: Cardiomediastinal silhouette unchanged in size and contour.
Calcifications of the aortic arch.

Pleuroparenchymal thickening at the bilateral apices is unchanged.

No pneumothorax. Blunting of the left costophrenic angle, likely
scarring/atelectasis. No evidence of interlobular septal thickening
confluent airspace disease, or large pleural effusion.

No displaced fracture
IMPRESSION: Chronic lung changes without evidence of acute cardiopulmonary
disease.

## 2019-08-01 MED ORDER — VALPROATE SODIUM 500 MG/5ML IV SOLN
750.0000 mg | Freq: Once | INTRAVENOUS | Status: AC
  Administered 2019-08-01: 750 mg via INTRAVENOUS
  Filled 2019-08-01: qty 7.5

## 2019-08-01 MED ORDER — IRBESARTAN 75 MG PO TABS
37.5000 mg | ORAL_TABLET | Freq: Every day | ORAL | Status: DC
  Administered 2019-08-01 – 2019-08-02 (×2): 37.5 mg via ORAL
  Filled 2019-08-01 (×2): qty 0.5

## 2019-08-01 MED ORDER — VALPROIC ACID 250 MG PO CAPS
500.0000 mg | ORAL_CAPSULE | Freq: Two times a day (BID) | ORAL | Status: DC
  Administered 2019-08-01 – 2019-08-02 (×2): 500 mg via ORAL
  Filled 2019-08-01 (×3): qty 2

## 2019-08-01 NOTE — Evaluation (Signed)
Clinical/Bedside Swallow Evaluation Patient Details  Name: Suzanne Cantu MRN: [id number] Date of Birth: 12/25/1926  Today's Date: 08/01/2019 Time: SLP Start Time (ACUTE ONLY): 79 SLP Stop Time (ACUTE ONLY): 1048 SLP Time Calculation (min) (ACUTE ONLY): 12 min  Past Medical History:  Past Medical History:  Diagnosis Date  . Diabetes mellitus without complication ([place])   . Hypertension    Past Surgical History:  Past Surgical History:  Procedure Laterality Date  . ABDOMINAL HYSTERECTOMY    . CESAREAN SECTION     4  . CHOLECYSTECTOMY    . KIDNEY STONE SURGERY     HPI:  Suzanne Cantu is a 86 y.o. female with medical history significant for essential hypertension diabetes mellitus type 2, chronic pain presenting to the ED for confusion and left-sided weakness.  Per report, patient was having slurred speech and left-sided weakness that began around 4 PM this afternoon.  Approximately 2 hours prior to presentation.  No fevers or recent chills, no recent illnesses, patient has history of hypertension and diabetes, code stroke was at activated on arrival patient was protecting her airway, hypertensive in the low 329J systolic, patient did have mild left-sided droop noticed as well as some mild left-sided weakness.  No chest pain or shortness of breath, no abdominal pain or vomiting, no nausea, no urinary symptoms recently.  Seen by neurology and symptoms suspected to be related to hypertensive emergency rather than CVA.  MRI 1/28: "Punctate acute lacunar infarct in the posterior left cingulate gyrus, perhaps reflecting asymptomatic small vessel disease in this clinical setting. No associated hemorrhage or mass effect."  CXR 1/29 with no acute findings   Assessment / Plan / Recommendation Clinical Impression  Pt presents with functional swallowing as assessed clinically.  Pt tolerated all consistencies trialed with no clincial s/s of aspiration, including straw sips of thin liquid. Pt  was able to feed self independently and exhibited good oral clearance of regular solids. Recommend regular texture diet with thin liquids.  Pt has no further ST needs at this time.  SLP will sign off.  Please re-consult if there is a change in status. SLP Visit Diagnosis: Dysphagia, unspecified (R13.10)    Aspiration Risk  No limitations    Diet Recommendation Regular;Thin liquid   Liquid Administration via: Cup;Straw Medication Administration: Whole meds with liquid Supervision: Patient able to self feed Compensations: Slow rate;Small sips/bites Postural Changes: Seated upright at 90 degrees    Other  Recommendations Oral Care Recommendations: Oral care BID   Follow up Recommendations None      Frequency and Duration (N/A)          Prognosis Prognosis for Safe Diet Advancement: (N/A)      Swallow Study   General Date of Onset: 07/31/19 HPI: Suzanne Cantu is a 86 y.o. female with medical history significant for essential hypertension diabetes mellitus type 2, chronic pain presenting to the ED for confusion and left-sided weakness.  Per report, patient was having slurred speech and left-sided weakness that began around 4 PM this afternoon.  Approximately 2 hours prior to presentation.  No fevers or recent chills, no recent illnesses, patient has history of hypertension and diabetes, code stroke was at activated on arrival patient was protecting her airway, hypertensive in the low 242A systolic, patient did have mild left-sided droop noticed as well as some mild left-sided weakness.  No chest pain or shortness of breath, no abdominal pain or vomiting, no nausea, no urinary symptoms recently.  Seen by neurology  and symptoms suspected to be related to hypertensive emergency rather than CVA.  MRI 1/28: "Punctate acute lacunar infarct in the posterior left cingulate gyrus, perhaps reflecting asymptomatic small vessel disease in this clinical setting. No associated hemorrhage or mass  effect."  CXR 1/29 with no acute findings Type of Study: Bedside Swallow Evaluation Previous Swallow Assessment: None Diet Prior to this Study: NPO Temperature Spikes Noted: No Respiratory Status: Room air History of Recent Intubation: No Behavior/Cognition: Alert;Cooperative;Pleasant mood Oral Cavity Assessment: Within Functional Limits Oral Care Completed by SLP: No Oral Cavity - Dentition: Adequate natural dentition Vision: Functional for self-feeding Self-Feeding Abilities: Able to feed self Patient Positioning: Upright in chair Baseline Vocal Quality: Normal Volitional Swallow: Able to elicit    Oral/Motor/Sensory Function Overall Oral Motor/Sensory Function: Within functional limits Facial ROM: Within Functional Limits Facial Symmetry: Abnormal symmetry left(very slight) Lingual ROM: Within Functional Limits Lingual Symmetry: Within Functional Limits Lingual Strength: Within Functional Limits Velum: Within Functional Limits Mandible: Within Functional Limits   Ice Chips Ice chips: Not tested   Thin Liquid Thin Liquid: Within functional limits Presentation: Cup;Straw Pharyngeal  Phase Impairments: Multiple swallows    Nectar Thick Nectar Thick Liquid: Not tested   Honey Thick Honey Thick Liquid: Not tested   Puree Puree: Within functional limits Presentation: Spoon   Solid     Solid: Within functional limits Presentation: Self Fed      Kerrie Pleasure, MA, CCC-SLP Acute Rehabilitation Services Office: [phone]  08/01/2019,12:15 PM

## 2019-08-01 NOTE — Progress Notes (Signed)
Updated patient's daughter by phone on patient's condition.  Addressed all questions and concerns.

## 2019-08-01 NOTE — Progress Notes (Signed)
Neurology Progress Note   S:// Seen and examined. No acute changes overnight EEG completed overnight   O:// Current vital signs: BP (!) 160/72 (BP Location: Right Arm)   Pulse 75   Temp 97.8 F (36.6 C)   Resp 18   Ht 5\' 2"  (1.575 m)   Wt 49 kg   SpO2 100%   BMI 19.75 kg/m  Vital signs in last 24 hours: Temp:  [97.8 F (36.6 C)-99.1 F (37.3 C)] 97.8 F (36.6 C) (01/29 0800) Pulse Rate:  [75-92] 75 (01/29 0800) Resp:  [13-19] 18 (01/29 0800) BP: (148-209)/(64-101) 160/72 (01/29 0800) SpO2:  [94 %-100 %] 100 % (01/29 0800) Weight:  [48.5 kg-54 kg] 49 kg (01/29 0402) Neurological exam Was sleeping in bed, awakens to voice. Follows all commands Is able to tell me her name, tell me she is in the hospital. Not able to tell me the month. Cranial nerves: Pupils equal round react light, extraocular movements intact, visual fields are full, face is symmetric, tongue and palate midline. Motor exam: Antigravity in all fours without drift. Coordination: No dysmetria Sensory: Intact light touch all over Gait testing deferred at this time  Medications  Current Facility-Administered Medications:  .  0.9 %  sodium chloride infusion, 250 mL, Intravenous, PRN, Cristescu, Mircea G, MD .  0.9 %  sodium chloride infusion, , Intravenous, Continuous, Cristescu, Mircea G, MD, Last Rate: 100 mL/hr at 07/31/19 2219, New Bag at 07/31/19 2219 .  aspirin EC tablet 81 mg, 81 mg, Oral, Daily, Cristescu, Mircea G, MD .  cholecalciferol (VITAMIN D3) tablet 5,000 Units, 5,000 Units, Oral, QPC supper, Cristescu, Mircea G, MD .  enoxaparin (LOVENOX) injection 40 mg, 40 mg, Subcutaneous, Q24H, Cristescu, Mircea G, MD, 40 mg at 07/31/19 2226 .  hydrALAZINE (APRESOLINE) injection 5 mg, 5 mg, Intravenous, Q4H PRN, Cristescu, Mircea G, MD .  hydrochlorothiazide 10 mg/mL oral suspension 6.25 mg, 6.25 mg, Oral, Daily, Cristescu, Mircea G, MD .  insulin aspart (novoLOG) injection 0-9 Units, 0-9 Units,  Subcutaneous, Q4H, Cristescu, Linard Millers, MD, 1 Units at 08/01/19 0450 .  irbesartan (AVAPRO) tablet 37.5 mg, 37.5 mg, Oral, Daily, Cristescu, Mircea G, MD .  latanoprost (XALATAN) 0.005 % ophthalmic solution 1 drop, 1 drop, Both Eyes, QHS, Cristescu, Mircea G, MD .  multivitamin with minerals tablet 1 tablet, 1 tablet, Oral, Daily, Cristescu, Mircea G, MD .  ondansetron (ZOFRAN) tablet 4 mg, 4 mg, Oral, Q6H PRN **OR** ondansetron (ZOFRAN) injection 4 mg, 4 mg, Intravenous, Q6H PRN, Cristescu, Mircea G, MD .  sodium chloride flush (NS) 0.9 % injection 3 mL, 3 mL, Intravenous, Once, Nanavati, Ankit, MD .  sodium chloride flush (NS) 0.9 % injection 3 mL, 3 mL, Intravenous, Q12H, Cristescu, Mircea G, MD, 3 mL at 07/31/19 2221 .  sodium chloride flush (NS) 0.9 % injection 3 mL, 3 mL, Intravenous, Q12H, Cristescu, Mircea G, MD .  sodium chloride flush (NS) 0.9 % injection 3 mL, 3 mL, Intravenous, PRN, Cristescu, Linard Millers, MD Labs CBC    Component Value Date/Time   WBC 7.3 08/01/2019 0453   RBC 4.28 08/01/2019 0453   HGB 13.1 08/01/2019 0453   HCT 38.5 08/01/2019 0453   PLT 202 08/01/2019 0453   MCV 90.0 08/01/2019 0453   MCH 30.6 08/01/2019 0453   MCHC 34.0 08/01/2019 0453   RDW 12.8 08/01/2019 0453   LYMPHSABS 2.0 07/31/2019 1808   MONOABS 0.5 07/31/2019 1808   EOSABS 0.2 07/31/2019 1808   BASOSABS 0.0 07/31/2019 1808  CMP     Component Value Date/Time   NA 131 (L) 08/01/2019 0453   K 3.7 08/01/2019 0453   CL 92 (L) 08/01/2019 0453   CO2 29 08/01/2019 0453   GLUCOSE 137 (H) 08/01/2019 0453   BUN 7 (L) 08/01/2019 0453   CREATININE 0.69 08/01/2019 0453   CALCIUM 9.4 08/01/2019 0453   PROT 6.9 07/31/2019 1808   ALBUMIN 3.9 07/31/2019 1808   AST 20 07/31/2019 1808   ALT 12 07/31/2019 1808   ALKPHOS 56 07/31/2019 1808   BILITOT 0.2 (L) 07/31/2019 1808   GFRNONAA >60 08/01/2019 0453   GFRAA >60 08/01/2019 0453   EEG slowing-generalized and more right-sided.  Imaging I have  reviewed images in epic and the results pertinent to this consultation are: MRI brain with a punctate lacunar infarct in the posterior left cingulate gyrus perhaps reflecting asymptomatic small vessel disease.  Advanced chronic small vessel ischemia.  Assessment: 86 year old woman past history of diabetes hypertension minimal cognitive impairment, evaluated for confusion and left-sided weakness with an exam and presentation consistent more with encephalopathy and not very focal.  Family did report some left-sided weakness, similar to what had happened at least twice in the past.  CT head with no bleed.  Systolic blood pressures on arrival in the 200s. MRI brain with a solitary punctate infarct in the left frontal lobe/insular gyrus likely incidental and asymptomatic infarct.  EEG suggestive of generalized slowing and also focal slowing on the right.  No seizures were seen  This raises concern for possible underlying seizures though, given the symptoms noted by family with left-sided slowing noted here an EEG is right-sided-likely postictal. I would prefer to start her on an antiepileptic agent since the stereotypical episodes of happened before as well.   Hypertensive encephalopathy still remains in the differential  Stroke finding on MRI merely incidental at this time and does not require any further follow-up.   Recommendations:   -I would repeat a spot EEG now.  -I would start her on Depakote 500 BID after a 750 mg IV load  -Monitor overnight and discharge tomorrow if remains stable. Check Depakote level in 5-7 days - follow up with outpatient neurology.  -Strict blood pressure management with goal blood pressure reduction to 140/90 upon discharge with gradual reduction of around 20% every day.  -Follow-up on the TSH and B12 levels per primary team.  Keep vitamin B12 levels in the high normal range..  -Check urinalysis, chest x-ray as recommended in the edition consultation-management  per primary team if her UTI or pneumonia is present.  -- Milon Dikes, MD [facility] Pager: [phone] If 7pm to 7am, please call on call as listed on AMION.

## 2019-08-01 NOTE — Progress Notes (Addendum)
PROGRESS NOTE  Suzanne Cantu VHQ:[id number] DOB: 05-23-1927 DOA: 07/31/2019 PCP: Cletis Athens, MD  HPI/Recap of past 24 hours: Patient seen and examined at bedside she had no complaints.  She was eating her lunch without any trouble  Assessment/Plan: Principal Problem:   Encephalopathy acute Active Problems:   Essential hypertension   Type 2 diabetes mellitus with other specified complication (HCC)   Chronic back pain   Hypertensive emergency   Acute encephalopathy    1.  Hypertensive urgency.  Her systolic blood pressure was over 413 with a diastolic of about 244 on presentation blood pressure is improved  2.  Altered mental status with confusion and slurred speech and left-sided weakness Work-up was negative for stroke MRI and CT scan results were negative negative Patient improved after blood pressure was better controlled she will continue her home meds blood pressure medication and IV hydralazine as needed for blood pressure control  neurology consult and speech evaluation has been done.  3.  Diabetes mellitus she takes Metformin at home which is currently on hold.  We will continue sliding scale insulin  4.  Hyponatremia  we will continue to replace with IV normal saline  5.  Chronic back pain Resume home medication  6.  Hypomagnesemia we will replace and recheck magnesium level   DVT prophylaxis: Lovenox Code Status: Full code Family Communication:   Disposition Plan: Discharge home Consults called: Neurology consult   Procedures:  None  Antimicrobials:  None     Objective: Vitals:   08/01/19 0402 08/01/19 0406 08/01/19 0800 08/01/19 1213  BP:  (!) 148/64 (!) 160/72 (!) 152/76  Pulse:  84 75 78  Resp:  17 18 18   Temp:  99.1 F (37.3 C) 97.8 F (36.6 C) 97.8 F (36.6 C)  TempSrc:  Oral  Oral  SpO2:  98% 100% 98%  Weight: 49 kg     Height:        Intake/Output Summary (Last 24 hours) at 08/01/2019 1821 Last data filed at 08/01/2019  1300 Gross per 24 hour  Intake 706.27 ml  Output 200 ml  Net 506.27 ml   Filed Weights   07/31/19 1700 07/31/19 2322 08/01/19 0402  Weight: 54 kg 48.5 kg 49 kg   Body mass index is 19.75 kg/m.  Exam:  . General: 86 y.o. year-old female well developed well nourished in no acute distress.  Alert and oriented x3.  Pleasant no distress . Cardiovascular: Regular rate and rhythm with no rubs or gallops.  No thyromegaly or JVD noted.   Marland Kitchen Respiratory: Clear to auscultation with no wheezes or rales. Good inspiratory effort. . Abdomen: Soft nontender nondistended with normal bowel sounds x4 quadrants. . Musculoskeletal: No lower extremity edema. 2/4 pulses in all 4 extremities. . Skin: No ulcerative lesions noted or rashes, . Psychiatry: Mood is appropriate for condition and setting    Data Reviewed: CBC: Recent Labs  Lab 07/31/19 1808 07/31/19 1812 08/01/19 0453  WBC 7.0  --  7.3  NEUTROABS 4.2  --   --   HGB 13.8 14.3 13.1  HCT 41.5 42.0 38.5  MCV 91.4  --  90.0  PLT 193  --  010   Basic Metabolic Panel: Recent Labs  Lab 07/31/19 1808 07/31/19 1812 08/01/19 0453  NA 130* 129* 131*  K 4.1 4.0 3.7  CL 91* 91* 92*  CO2 28  --  29  GLUCOSE 151* 149* 137*  BUN 9 11 7*  CREATININE 0.61 0.60 0.69  CALCIUM 9.9  --  9.4  MG  --   --  1.5*   GFR: Estimated Creatinine Clearance: 36.2 mL/min (by C-G formula based on SCr of 0.69 mg/dL). Liver Function Tests: Recent Labs  Lab 07/31/19 1808  AST 20  ALT 12  ALKPHOS 56  BILITOT 0.2*  PROT 6.9  ALBUMIN 3.9   No results for input(s): LIPASE, AMYLASE in the last 168 hours. No results for input(s): AMMONIA in the last 168 hours. Coagulation Profile: Recent Labs  Lab 07/31/19 1808  INR 0.9   Cardiac Enzymes: No results for input(s): CKTOTAL, CKMB, CKMBINDEX, TROPONINI in the last 168 hours. BNP (last 3 results) No results for input(s): PROBNP in the last 8760 hours. HbA1C: Recent Labs    07/31/19 1800  HGBA1C  6.6*   CBG: Recent Labs  Lab 07/31/19 2149 08/01/19 0045 08/01/19 0408 08/01/19 0929 08/01/19 1147  GLUCAP 188* 134* 126* 135* 212*   Lipid Profile: No results for input(s): CHOL, HDL, LDLCALC, TRIG, CHOLHDL, LDLDIRECT in the last 72 hours. Thyroid Function Tests: Recent Labs    08/01/19 0950  TSH 2.468   Anemia Panel: Recent Labs    08/01/19 0950  VITAMINB12 365   Urine analysis:    Component Value Date/Time   COLORURINE YELLOW 08/01/2019 1146   APPEARANCEUR CLOUDY (A) 08/01/2019 1146   LABSPEC 1.011 08/01/2019 1146   PHURINE 8.0 08/01/2019 1146   GLUCOSEU NEGATIVE 08/01/2019 1146   HGBUR NEGATIVE 08/01/2019 1146   BILIRUBINUR NEGATIVE 08/01/2019 1146   KETONESUR 5 (A) 08/01/2019 1146   PROTEINUR NEGATIVE 08/01/2019 1146   NITRITE NEGATIVE 08/01/2019 1146   LEUKOCYTESUR NEGATIVE 08/01/2019 1146   Sepsis Labs: @LABRCNTIP (procalcitonin:4,lacticidven:4)  ) Recent Results (from the past 240 hour(s))  SARS CORONAVIRUS 2 (TAT 6-24 HRS) Nasopharyngeal Nasopharyngeal Swab     Status: None   Collection Time: 07/31/19  7:20 PM   Specimen: Nasopharyngeal Swab  Result Value Ref Range Status   SARS Coronavirus 2 NEGATIVE NEGATIVE Final    Comment: (NOTE) SARS-CoV-2 target nucleic acids are NOT DETECTED. The SARS-CoV-2 RNA is generally detectable in upper and lower respiratory specimens during the acute phase of infection. Negative results do not preclude SARS-CoV-2 infection, do not rule out co-infections with other pathogens, and should not be used as the sole basis for treatment or other patient management decisions. Negative results must be combined with clinical observations, patient history, and epidemiological information. The expected result is Negative. Fact Sheet for Patients: 08/02/19 Fact Sheet for Healthcare Providers: HairSlick.no This test is not yet approved or cleared by the quierodirigir.com FDA and  has been authorized for detection and/or diagnosis of SARS-CoV-2 by FDA under an Emergency Use Authorization (EUA). This EUA will remain  in effect (meaning this test can be used) for the duration of the COVID-19 declaration under Section 56 4(b)(1) of the Act, 21 U.S.C. section 360bbb-3(b)(1), unless the authorization is terminated or revoked sooner. Performed at [facility], [address], [place], [place]       Studies: DG Chest 1 View  Result Date: 08/01/2019 CLINICAL DATA:  86 year old female with a history of cough EXAM: CHEST  1 VIEW COMPARISON:  03/12/2019 FINDINGS: Cardiomediastinal silhouette unchanged in size and contour. Calcifications of the aortic arch. Pleuroparenchymal thickening at the bilateral apices is unchanged. No pneumothorax. Blunting of the left costophrenic angle, likely scarring/atelectasis. No evidence of interlobular septal thickening confluent airspace disease, or large pleural effusion. No displaced fracture IMPRESSION: Chronic lung changes  without evidence of acute cardiopulmonary disease. Electronically Signed   By: Gilmer Mor D.O.   On: 08/01/2019 10:41   MR BRAIN WO CONTRAST  Result Date: 07/31/2019 CLINICAL DATA:  86 year old female code stroke presentation, left side weakness. Although clinical exam more suggestive of hypertensive urgency/emergency. EXAM: MRI HEAD WITHOUT CONTRAST TECHNIQUE: Multiplanar, multiecho pulse sequences of the brain and surrounding structures were obtained without intravenous contrast. COMPARISON:  Head CT 1759 hours today. FINDINGS: Brain: The only restricted diffusion identified is a punctate area of the left posterior cingulate gyrus best seen on series 5, image 82, but also on coronal diffusion series 7, image 49. Widespread and confluent bilateral cerebral white matter T2 and FLAIR hyperintensity, most resembling chronic small vessel ischemia in the bilateral corona radiata on series 10,  image 17. Comparatively mild T2 heterogeneity in the deep gray nuclei. No chronic cerebral hemisphere blood products although there is evidence of a chronic microhemorrhage in the right cerebellum deep cerebellar nuclei series 14, image 15. No cortical encephalomalacia identified. No midline shift, mass effect, evidence of mass lesion, ventriculomegaly, extra-axial collection or acute intracranial hemorrhage. Cervicomedullary junction and pituitary are within normal limits. Vascular: Major intracranial vascular flow voids are preserved. Skull and upper cervical spine: Negative for age visible cervical spine. Visualized bone marrow signal is within normal limits. Sinuses/Orbits: Postoperative changes to both globes otherwise negative orbits. Paranasal Visualized paranasal sinuses and mastoids are stable and well pneumatized. Other: Visible internal auditory structures appear normal. IMPRESSION: 1. Punctate acute lacunar infarct in the posterior left cingulate gyrus, perhaps reflecting asymptomatic small vessel disease in this clinical setting. No associated hemorrhage or mass effect. 2. No other acute intracranial abnormality. Underlying advanced chronic small vessel ischemia. Study discussed by telephone with Dr. Milon Dikes on 07/31/2019 at 1840 hours. Electronically Signed   By: Odessa Fleming M.D.   On: 07/31/2019 19:02   EEG adult  Result Date: 08/01/2019 Charlsie Quest, MD     08/01/2019 10:14 AM Patient Name: Suzanne Cantu MRN: [id number] Epilepsy Attending: Charlsie Quest Referring Physician/Provider: Date: 07/31/2019 Duration: 24.55 mins Patient history: 86 year old past history of diabetes hypertension, MCI presented for evaluation of confusion and left-sided weakness. EEG to evaluate for seizure. Level of alertness: awake AEDs during EEG study: None Technical aspects: This EEG study was done with scalp electrodes positioned according to the 10-20 International system of electrode placement. Electrical  activity was acquired at a sampling rate of 500Hz  and reviewed with a high frequency filter of 70Hz  and a low frequency filter of 1Hz . EEG data were recorded continuously and digitally stored. DESCRIPTION: No clear posterior dominant rhythm was seen. EEG showed continuous generalized and lateralized right hemisphere 3-6hz  theta-delta slowing.  Hyperventilation and photic stimulation were not performed. ABNORMALITY - Continuous slow, generalized and lateralized right hemisphere IMPRESSION: This study is suggestive of cortical dysfunction in right hemisphere, non specific to etiology. No seizures or epileptiform discharges were seen throughout the recording. Priyanka    Scheduled Meds: . aspirin EC  81 mg Oral Daily  . cholecalciferol  5,000 Units Oral QPC supper  . enoxaparin (LOVENOX) injection  40 mg Subcutaneous Q24H  . hydrochlorothiazide  6.25 mg Oral Daily  . insulin aspart  0-9 Units Subcutaneous Q4H  . irbesartan  37.5 mg Oral Daily  . latanoprost  1 drop Both Eyes QHS  . multivitamin with minerals  1 tablet Oral Daily  . sodium chloride flush  3 mL Intravenous Once  . sodium chloride flush  3 mL Intravenous Q12H  . sodium chloride flush  3 mL Intravenous Q12H  . valproic acid  500 mg Oral BID    Continuous Infusions: . sodium chloride    . sodium chloride 100 mL/hr at 08/01/19 1336     LOS: 1 day     Myrtie Neither, MD [facility]  To reach me or the doctor on call, go to: [email or web address] Password [facility]  08/01/2019, 6:21 PM

## 2019-08-01 NOTE — Procedures (Signed)
Patient Name: Suzanne Cantu  MRN: [id number]  Epilepsy Attending: Charlsie Quest  Referring Physician/Provider:  Date: 07/31/2019 Duration: 24.55 mins  Patient history: 86 year old past history of diabetes hypertension, MCI presented for evaluation of confusion and left-sided weakness. EEG to evaluate for seizure.   Level of alertness: awake  AEDs during EEG study: None  Technical aspects: This EEG study was done with scalp electrodes positioned according to the 10-20 International system of electrode placement. Electrical activity was acquired at a sampling rate of 500Hz  and reviewed with a high frequency filter of 70Hz  and a low frequency filter of 1Hz . EEG data were recorded continuously and digitally stored.   DESCRIPTION: No clear posterior dominant rhythm was seen. EEG showed continuous generalized and lateralized right hemisphere 3-6hz  theta-delta slowing.   Hyperventilation and photic stimulation were not performed.  ABNORMALITY - Continuous slow, generalized and lateralized right hemisphere  IMPRESSION: This study is suggestive of cortical dysfunction in right hemisphere, non specific to etiology. No seizures or epileptiform discharges were seen throughout the recording.     Suzanne Cantu 

## 2019-08-01 NOTE — H&P (Signed)
History and Physical    Suzanne Cantu FTD:[id number] DOB: March 02, 1927 DOA: 07/31/2019  PCP: Corky Downs, MD    Patient coming from: Home    Chief Complaint: Confusion, left-sided weakness, slurred speech.  HPI: Suzanne Cantu is a 86 y.o. female with medical history significant for essential hypertension diabetes mellitus type 2, chronic pain presenting to the ED for confusion and left-sided weakness.  Per report, patient was having slurred speech and left-sided weakness that began around 4 PM this afternoon.  Approximately 2 hours prior to presentation.  No fevers or recent chills, no recent illnesses, patient has history of hypertension and diabetes, code stroke was at activated on arrival patient was protecting her airway, hypertensive in the Cantu 200s systolic, patient did have mild left-sided droop noticed as well as some mild left-sided weakness.  No chest pain or shortness of breath, no abdominal pain or vomiting, no nausea, no urinary symptoms recently.  ED Course:  Emergency room seen by neurologist Does not think the episode is secondary to CVA Blood pressure over 200 systolic and over 120 diastolic He thinks the changes are secondary to hypertensive emergency Recommended decrease gentle blood pressure more than 20% in the first few hours MRI and CT scan no acute pathology to explain the changes At the time of my examination patient was moving the left arm   Review of Systems: As per HPI otherwise 10 point review of systems negative.  Exception/slurred speech, change in mental status  Past Medical History:  Diagnosis Date  . Diabetes mellitus without complication (HCC)   . Hypertension     Past Surgical History:  Procedure Laterality Date  . ABDOMINAL HYSTERECTOMY    . CESAREAN SECTION     4  . CHOLECYSTECTOMY    . KIDNEY STONE SURGERY       reports that she has never smoked. She has never used smokeless tobacco. She reports that she does not drink  alcohol. No history on file for drug.  Allergies  Allergen Reactions  . Metaxalone Other (See Comments)    "It made me not feel right"    No family history on file.   Prior to Admission medications   Medication Sig Start Date End Date Taking? Authorizing Provider  aspirin EC 81 MG tablet Take 81 mg by mouth daily.   Yes [provider]  bimatoprost (LUMIGAN) 0.01 % SOLN Place 1 drop into both eyes at bedtime.   Yes [provider]  Cholecalciferol (VITAMIN D-3 PO) Take 1 capsule by mouth daily after supper.   Yes [provider]  Docusate Sodium 100 MG capsule Take 100 mg by mouth every evening.   Yes [provider]  ibuprofen (ADVIL) 200 MG tablet Take 200 mg by mouth every 6 (six) hours as needed for headache, mild pain or moderate pain.    Yes [provider]  metFORMIN (GLUCOPHAGE) 500 MG tablet Take 500 mg by mouth 2 (two) times daily with a meal.    Yes [provider]  Multiple Vitamins-Minerals (PRESERVISION AREDS 2) CAPS Take 1 capsule by mouth 2 (two) times daily.   Yes [provider]  valsartan-hydrochlorothiazide (DIOVAN-HCT) 80-12.5 MG tablet Take 0.5 tablets by mouth daily.    Yes [provider]    Physical Exam: Vitals:   07/31/19 2321 07/31/19 2322 08/01/19 0402 08/01/19 0406  BP: (!) 175/88   (!) 148/64  Pulse: 92   84  Resp:    17  Temp: 98.8 F (37.1  C)   99.1 F (37.3 C)  TempSrc: Oral   Oral  SpO2: 98%   98%  Weight:  48.5 kg 49 kg   Height:  5\' 2"  (1.575 m)      Constitutional: NAD, calm, comfortable Vitals:   07/31/19 2321 07/31/19 2322 08/01/19 0402 08/01/19 0406  BP: (!) 175/88   (!) 148/64  Pulse: 92   84  Resp:    17  Temp: 98.8 F (37.1 C)   99.1 F (37.3 C)  TempSrc: Oral   Oral  SpO2: 98%   98%  Weight:  48.5 kg 49 kg   Height:  5\' 2"  (1.575 m)     Eyes: PERRL, lids and conjunctivae normal ENMT: Mucous membranes are moist. Posterior pharynx clear of any exudate  or lesions.Normal dentition.  Neck: normal, supple, no masses, no thyromegaly Respiratory: clear to auscultation bilaterally, no wheezing, no crackles. Normal respiratory effort. No accessory muscle use.  Cardiovascular: Regular rate and rhythm, no murmurs / rubs / gallops. No extremity edema. 2+ pedal pulses. No carotid bruits.  Abdomen: no tenderness, no masses palpated. No hepatosplenomegaly. Bowel sounds positive.  Musculoskeletal: no clubbing / cyanosis. No joint deformity upper and lower extremities. Good ROM, no contractures. Normal muscle tone.  Skin: no rashes, lesions, ulcers. No induration Neurologic: CN 2-12 grossly intact. Sensation intact, DTR normal. Strength 5/5 in all 4.  Psychiatric: Unable to evaluate   Labs on Admission: I have personally reviewed following labs and imaging studies  CBC: Recent Labs  Lab 07/31/19 1808 07/31/19 1812 08/01/19 0453  WBC 7.0  --  7.3  NEUTROABS 4.2  --   --   HGB 13.8 14.3 13.1  HCT 41.5 42.0 38.5  MCV 91.4  --  90.0  PLT 193  --  202   Basic Metabolic Panel: Recent Labs  Lab 07/31/19 1808 07/31/19 1812  NA 130* 129*  K 4.1 4.0  CL 91* 91*  CO2 28  --   GLUCOSE 151* 149*  BUN 9 11  CREATININE 0.61 0.60  CALCIUM 9.9  --    GFR: Estimated Creatinine Clearance: 36.2 mL/min (by C-G formula based on SCr of 0.6 mg/dL). Liver Function Tests: Recent Labs  Lab 07/31/19 1808  AST 20  ALT 12  ALKPHOS 56  BILITOT 0.2*  PROT 6.9  ALBUMIN 3.9   No results for input(s): LIPASE, AMYLASE in the last 168 hours. No results for input(s): AMMONIA in the last 168 hours. Coagulation Profile: Recent Labs  Lab 07/31/19 1808  INR 0.9   Cardiac Enzymes: No results for input(s): CKTOTAL, CKMB, CKMBINDEX, TROPONINI in the last 168 hours. BNP (last 3 results) No results for input(s): PROBNP in the last 8760 hours. HbA1C: Recent Labs    07/31/19 1800  HGBA1C 6.6*   CBG: Recent Labs  Lab 07/31/19 2149 08/01/19 0045  08/01/19 0408  GLUCAP 188* 134* 126*   Lipid Profile: No results for input(s): CHOL, HDL, LDLCALC, TRIG, CHOLHDL, LDLDIRECT in the last 72 hours. Thyroid Function Tests: No results for input(s): TSH, T4TOTAL, FREET4, T3FREE, THYROIDAB in the last 72 hours. Anemia Panel: No results for input(s): VITAMINB12, FOLATE, FERRITIN, TIBC, IRON, RETICCTPCT in the last 72 hours. Urine analysis:    Component Value Date/Time   COLORURINE YELLOW 03/12/2019 1920   APPEARANCEUR CLEAR 03/12/2019 1920   LABSPEC 1.012 03/12/2019 1920   PHURINE 6.0 03/12/2019 1920   GLUCOSEU NEGATIVE 03/12/2019 1920   HGBUR NEGATIVE 03/12/2019 1920   BILIRUBINUR NEGATIVE 03/12/2019 1920  KETONESUR 5 (A) 03/12/2019 1920   PROTEINUR NEGATIVE 03/12/2019 1920   NITRITE NEGATIVE 03/12/2019 1920   LEUKOCYTESUR MODERATE (A) 03/12/2019 1920    Radiological Exams on Admission: MR BRAIN WO CONTRAST  Result Date: 07/31/2019 CLINICAL DATA:  86 year old female code stroke presentation, left side weakness. Although clinical exam more suggestive of hypertensive urgency/emergency. EXAM: MRI HEAD WITHOUT CONTRAST TECHNIQUE: Multiplanar, multiecho pulse sequences of the brain and surrounding structures were obtained without intravenous contrast. COMPARISON:  Head CT 1759 hours today. FINDINGS: Brain: The only restricted diffusion identified is a punctate area of the left posterior cingulate gyrus best seen on series 5, image 82, but also on coronal diffusion series 7, image 49. Widespread and confluent bilateral cerebral white matter T2 and FLAIR hyperintensity, most resembling chronic small vessel ischemia in the bilateral corona radiata on series 10, image 17. Comparatively mild T2 heterogeneity in the deep gray nuclei. No chronic cerebral hemisphere blood products although there is evidence of a chronic microhemorrhage in the right cerebellum deep cerebellar nuclei series 14, image 15. No cortical encephalomalacia identified. No midline  shift, mass effect, evidence of mass lesion, ventriculomegaly, extra-axial collection or acute intracranial hemorrhage. Cervicomedullary junction and pituitary are within normal limits. Vascular: Major intracranial vascular flow voids are preserved. Skull and upper cervical spine: Negative for age visible cervical spine. Visualized bone marrow signal is within normal limits. Sinuses/Orbits: Postoperative changes to both globes otherwise negative orbits. Paranasal Visualized paranasal sinuses and mastoids are stable and well pneumatized. Other: Visible internal auditory structures appear normal. IMPRESSION: 1. Punctate acute lacunar infarct in the posterior left cingulate gyrus, perhaps reflecting asymptomatic small vessel disease in this clinical setting. No associated hemorrhage or mass effect. 2. No other acute intracranial abnormality. Underlying advanced chronic small vessel ischemia. Study discussed by telephone with Dr. Amie Portland on 07/31/2019 at [place] hours. Electronically Signed   By: Genevie Ann M.D.   On: 07/31/2019 19:02   CT HEAD CODE STROKE WO CONTRAST  Result Date: 07/31/2019 CLINICAL DATA:  Code stroke. 86 year old female with left side drift and slurred speech. EXAM: CT HEAD WITHOUT CONTRAST TECHNIQUE: Contiguous axial images were obtained from the base of the skull through the vertex without intravenous contrast. COMPARISON:  03/12/2019 head CT. FINDINGS: Brain: No acute intracranial hemorrhage identified. No midline shift, mass effect, or evidence of intracranial mass lesion. Stable cerebral volume. No ventriculomegaly. Patchy and confluent bilateral white matter hypodensity appears stable. No cortically based acute infarct identified. Deep gray nuclei remain relatively spared. Posterior fossa appears stable and negative. Vascular: Extensive Calcified atherosclerosis at the skull base. Severe chronic calcified plaque in the proximal right MCA. No suspicious intracranial vascular hyperdensity.  Skull: No acute osseous abnormality identified. Sinuses/Orbits: Visualized paranasal sinuses and mastoids are stable and well pneumatized. Other: No acute orbit or scalp soft tissue findings. ASPECTS [facility] Stroke Program Early CT Score) Total score (0-10 with 10 being normal): 10 IMPRESSION: 1. Stable non contrast CT appearance of the brain. No acute cortically based infarct or acute intracranial hemorrhage identified. ASPECTS 10. 2. Severe chronic intracranial atherosclerosis, including right M1 involvement. Chronic white matter small vessel disease. 3. These results were communicated to Dr. Rory Percy at 6:05 pmon 1/28/2021by text page via the [facility] messaging system. Electronically Signed   By: Genevie Ann M.D.   On: 07/31/2019 18:06    EKG: Independently reviewed.  Normal sinus no acute ST-T changes  Assessment and plan  Hypertensive urgency Systolic blood pressure over 485 with diastolic over 462 Change in mental  status confusion slurred speech and left-sided weakness Work-up for stroke negative See MRI and CT results Patient improved after decrease of the blood pressure Resume her home medication with hydralazine IV as needed, n.p.o. Speech evaluation, neurology consult done  Essential hypertension Resume home medication  Diabetes mellitus type 2 on Metformin Plan hold Metformin Patient n.p.o. insulin sliding scale per sensitivity factor  Chronic back pain Resume home medication   Assessment/Plan Principal Problem:   Encephalopathy acute Active Problems:   Essential hypertension   Type 2 diabetes mellitus with other specified complication (HCC)   Chronic back pain   Hypertensive emergency   Acute encephalopathy      DVT prophylaxis: Lovenox Code Status: Full code Family Communication:   Disposition Plan: Discharge home Consults called: Neurology consult Admission status: Full admission   Ethyl Vila G Akirra Lacerda MD [facility]  If 7PM-7AM, please contact  night-coverage [email or web address]   08/01/2019, 6:56 AM

## 2019-08-01 NOTE — Evaluation (Signed)
Physical Therapy Evaluation Patient Details Name: Suzanne Cantu MRN: [id number] DOB: [birth year] Today's Date: 08/01/2019   History of Present Illness  Patient is a 86 y/o female who presents with slurred speech, left sided weakness and confusion. Brain MRI- small punctate restricted diffusion high left frontal region-likely incidental finding and unrelated to symptoms per neurologist.. Admitted with toxic metabolic encephalopathy vs HTN emergency. PMH incudes HTN and DM.  Clinical Impression  Patient presents with impaired balance, generalized weakness and impaired mobility s/p above. Pt reports being Mod I PTA using RW for support. No reported history of falls. Reports her daughter helps out with IADLs at times. Today, pt tolerated bed mobility, transfers and gait training with min guard for safety. Cognition seems [facility] for basic mobility tasks. Would recommend initial 24/7 at home for a few days for safety. Will follow acutely to maximize independence and mobility prior to return home.    Follow Up Recommendations Home health PT;Supervision - Intermittent    Equipment Recommendations  None recommended by PT    Recommendations for Other Services       Precautions / Restrictions Precautions Precautions: Fall Restrictions Weight Bearing Restrictions: No      Mobility  Bed Mobility Overal bed mobility: Needs Assistance Bed Mobility: Supine to Sit     Supine to sit: Supervision;HOB elevated     General bed mobility comments: no assist needed, no use of rail to get to EOB.  Transfers Overall transfer level: Needs assistance Equipment used: Rolling walker (2 wheeled) Transfers: Sit to/from Stand Sit to Stand: Min guard         General transfer comment: Min guard for safety. Stood from [employer], from toilet x1, transferred to chair post ambulation.  Ambulation/Gait Ambulation/Gait assistance: Min guard Gait Distance (Feet): 16 Feet(+ 24') Assistive device: Rolling  walker (2 wheeled) Gait Pattern/deviations: Step-through pattern;Decreased stride length   Gait velocity interpretation: 1.31 - 2.62 ft/sec, indicative of limited community ambulator General Gait Details: Impulsive type gait using RW for first half of gait and needing help to re-adjust RW after washing hands at sink to get to chair as pt walking with it sideways. Steady gait.  Stairs            Wheelchair Mobility    Modified Rankin (Stroke Patients Only) Modified Rankin (Stroke Patients Only) Pre-Morbid Rankin Score: Slight disability Modified Rankin: Moderate disability     Balance Overall balance assessment: Needs assistance Sitting-balance support: Feet supported;No upper extremity supported Sitting balance-Leahy Scale: Good     Standing balance support: During functional activity Standing balance-Leahy Scale: Fair Standing balance comment: Able to stand at the sink and wash hands without LOB or difficulty, no UE support needed.                             Pertinent Vitals/Pain Pain Assessment: No/denies pain Faces Pain Scale: No hurt    Home Living Family/patient expects to be discharged to:: Private residence Living Arrangements: Alone Available Help at Discharge: Family;Available PRN/intermittently Type of Home: House Home Access: Ramped entrance     Home Layout: Two level;Able to live on main level with bedroom/bathroom Home Equipment: Dan Humphreys - 2 wheels      Prior Function Level of Independence: Independent with assistive device(s)         Comments: Uses RW per daughter request only more recently. Reports doing her own ADLs and some cooking. daughter helps with some IADls.  Hand Dominance        Extremity/Trunk Assessment   Upper Extremity Assessment Upper Extremity Assessment: Defer to OT evaluation    Lower Extremity Assessment Lower Extremity Assessment: Generalized weakness(but functional)       Communication    Communication: No difficulties  Cognition Arousal/Alertness: Awake/alert Behavior During Therapy: WFL for tasks assessed/performed;Impulsive Overall Cognitive Status: No family/caregiver present to determine baseline cognitive functioning                                 General Comments: A&Ox4, able to follow simple 1-2 step commands, slightly impulsive. Will assess further. Seems WFL today with basic tasks. Talking on the phone with her daughter appropriately.      General Comments      Exercises     Assessment/Plan    PT Assessment Patient needs continued PT services  PT Problem List Decreased strength;Decreased mobility;Decreased safety awareness;Decreased balance;Decreased knowledge of use of DME;Decreased cognition       PT Treatment Interventions Therapeutic activities;Gait training;Therapeutic exercise;Patient/family education;Balance training;Functional mobility training;DME instruction    PT Goals (Current goals can be found in the Care Plan section)  Acute Rehab PT Goals Patient Stated Goal: to go home PT Goal Formulation: With patient Time For Goal Achievement: 08/15/19 Potential to Achieve Goals: Good    Frequency Min 3X/week   Barriers to discharge Decreased caregiver support lives alone    Co-evaluation               AM-PAC PT "6 Clicks" Mobility  Outcome Measure Help needed turning from your back to your side while in a flat bed without using bedrails?: None Help needed moving from lying on your back to sitting on the side of a flat bed without using bedrails?: None Help needed moving to and from a bed to a chair (including a wheelchair)?: A Little Help needed standing up from a chair using your arms (e.g., wheelchair or bedside chair)?: A Little Help needed to walk in hospital room?: A Little Help needed climbing 3-5 steps with a railing? : A Little 6 Click Score: 20    End of Session Equipment Utilized During Treatment: Gait  belt Activity Tolerance: Patient tolerated treatment well Patient left: in chair;with call bell/phone within reach;with chair alarm set Nurse Communication: Mobility status PT Visit Diagnosis: Difficulty in walking, not elsewhere classified (R26.2);Unsteadiness on feet (R26.81);Muscle weakness (generalized) (M62.81)    Time: 4034-7425 PT Time Calculation (min) (ACUTE ONLY): 16 min   Charges:   PT Evaluation $PT Eval Moderate Complexity: 1 Mod          Marisa Severin, PT, DPT Acute Rehabilitation Services Pager [phone] Office [phone]      Marguarite Arbour A Sabra Heck 08/01/2019, 12:31 PM

## 2019-08-02 ENCOUNTER — Inpatient Hospital Stay (HOSPITAL_COMMUNITY): Payer: Medicare Other

## 2019-08-02 LAB — CBC
HCT: 38.1 % (ref 36.0–46.0)
Hemoglobin: 12.9 g/dL (ref 12.0–15.0)
MCH: 30.4 pg (ref 26.0–34.0)
MCHC: 33.9 g/dL (ref 30.0–36.0)
MCV: 89.6 fL (ref 80.0–100.0)
Platelets: 172 10*3/uL (ref 150–400)
RBC: 4.25 MIL/uL (ref 3.87–5.11)
RDW: 12.8 % (ref 11.5–15.5)
WBC: 6.7 10*3/uL (ref 4.0–10.5)
nRBC: 0 % (ref 0.0–0.2)

## 2019-08-02 LAB — GLUCOSE, CAPILLARY
Glucose-Capillary: 99 mg/dL (ref 70–99)
Glucose-Capillary: 144 mg/dL — ABNORMAL HIGH (ref 70–99)
Glucose-Capillary: 136 mg/dL — ABNORMAL HIGH (ref 70–99)

## 2019-08-02 LAB — BASIC METABOLIC PANEL
Anion gap: 10 (ref 5–15)
BUN: 6 mg/dL — ABNORMAL LOW (ref 8–23)
CO2: 28 mmol/L (ref 22–32)
Calcium: 9.4 mg/dL (ref 8.9–10.3)
Chloride: 98 mmol/L (ref 98–111)
Creatinine, Ser: 0.64 mg/dL (ref 0.44–1.00)
GFR calc Af Amer: >60 mL/min (ref >60)
GFR calc non Af Amer: >60 mL/min (ref >60)
Glucose, Bld: 145 mg/dL — ABNORMAL HIGH (ref 70–99)
Potassium: 3.9 mmol/L (ref 3.5–5.1)
Sodium: 136 mmol/L (ref 135–145)

## 2019-08-02 LAB — MAGNESIUM: Magnesium: 1.7 mg/dL (ref 1.7–2.4)

## 2019-08-02 LAB — VALPROIC ACID LEVEL: Valproic Acid Lvl: 61 ug/mL (ref 50.0–100.0)

## 2019-08-02 MED ORDER — CYANOCOBALAMIN 500 MCG PO TABS: 500.0000 ug | ORAL_TABLET | Freq: Every day | ORAL | 0 refills | Status: DC

## 2019-08-02 MED ORDER — VALPROIC ACID 250 MG PO CAPS: 500.0000 mg | ORAL_CAPSULE | Freq: Two times a day (BID) | ORAL | 0 refills | Status: DC

## 2019-08-02 MED ORDER — VALSARTAN-HYDROCHLOROTHIAZIDE 80-12.5 MG PO TABS: 1.0000 | ORAL_TABLET | Freq: Every day | ORAL | 0 refills | Status: DC

## 2019-08-02 MED ORDER — MAGNESIUM OXIDE 400 (241.3 MG) MG PO TABS
800.0000 mg | ORAL_TABLET | Freq: Once | ORAL | Status: AC
  Administered 2019-08-02: 800 mg via ORAL
  Filled 2019-08-02: qty 2

## 2019-08-02 NOTE — Progress Notes (Signed)
EEG complete - results pending 

## 2019-08-02 NOTE — Discharge Summary (Signed)
Discharge Summary  Suzanne Cantu NKN:[id number] DOB: Feb 27, 1927  PCP: Corky Downs, MD  Admit date: 07/31/2019 Discharge date: 08/02/2019  Time spent: 32 minutes  Recommendations for Outpatient Follow-up:  1. Primary care provider in 1 to 3 weeks 2. Updated recommendations: -Continue Depakote 500 twice daily -Check Depakote levels prior to outpatient visit. -Blood pressure control per primary team as you are -Replenish B12 -Maintain seizure precautions -Follow-up with outpatient neurology in 4 to 8 weeks.    Discharge Diagnoses:  Active Hospital Problems   Diagnosis Date Noted  . Encephalopathy acute 07/31/2019  . Essential hypertension 07/31/2019  . Type 2 diabetes mellitus with other specified complication (HCC) 07/31/2019  . Chronic back pain 07/31/2019  . Hypertensive emergency 07/31/2019  . Acute encephalopathy 07/31/2019    Resolved Hospital Problems  No resolved problems to display.    Discharge Condition: Stable  Diet recommendation: Regular diet heart healthy  Vitals:   08/02/19 0738 08/02/19 1209  BP: (!) 164/70 (!) 166/81  Pulse: 80 82  Resp: 18 18  Temp: 97.6 F (36.4 C) 98 F (36.7 C)  SpO2: 100% 100%    History of present illness:   Suzanne Cantu is a 86 y.o. female with medical history significant for essential hypertension diabetes mellitus type 2, chronic pain presenting to the ED for confusion and left-sided weakness. Per report, patient was having slurred speech and left-sided weakness that began around 4 PM this afternoon. Approximately 2 hours prior to presentation. No fevers or recent chills, no recent illnesses, patient has history of hypertension and diabetes, code stroke was at activated on arrival patient was protecting her airway, hypertensive in the low 200s systolic, patient did have mild left-sided droop noticed as well as some mild left-sided weakness. No chest pain or shortness of breath, no abdominal pain or vomiting,  no nausea, no urinary symptoms recently.  Hospital Course:  Principal Problem:   Encephalopathy acute Active Problems:   Essential hypertension   Type 2 diabetes mellitus with other specified complication (HCC)   Chronic back pain   Hypertensive emergency   Acute encephalopathy 1.  Hypertensive urgency.  Her systolic blood pressure was over 200 with a diastolic of about 120 on presentation blood pressure is improved  2.  Altered mental status with confusion and slurred speech and left-sided weakness Work-up was negative for stroke MRI and CT scan results were negative negative Patient improved after blood pressure was better controlled she will continue her home meds blood pressure medication and IV hydralazine as needed for blood pressure control  neurology consult and speech evaluation has been done.  3.  Diabetes mellitus she takes Metformin at home which is currently on hold.  We will continue sliding scale insulin  4.  Hyponatremia  we will continue to replace with IV normal saline  5.  Chronic back pain Resume home medication  6.  Hypomagnesemia we will replace and recheck magnesium level   7.  Vitamin B12 deficiency mildly level was 5 and neurology is recommending replacement so she will be started on vitamin B-12 500 mcg daily Procedures:  EEG  Consultations:  Neurology  Neurophysiology  Discharge Exam: BP (!) 166/81 (BP Location: Right Arm)   Pulse 82   Temp 98 F (36.7 C) (Oral)   Resp 18   Ht 5\' 2"  (1.575 m)   Wt 47.6 kg   SpO2 100%   BMI 19.20 kg/m   General: Alert oriented x1-2 Cardiovascular: Regular rate and rhythm, no murmur no edema  Respiratory: Clear to auscultation bilaterally  Discharge Instructions You were cared for by a hospitalist during your hospital stay. If you have any questions about your discharge medications or the care you received while you were in the hospital after you are discharged, you can call the unit and asked to  speak with the hospitalist on call if the hospitalist that took care of you is not available. Once you are discharged, your primary care physician will handle any further medical issues. Please note that NO REFILLS for any discharge medications will be authorized once you are discharged, as it is imperative that you return to your primary care physician (or establish a relationship with a primary care physician if you do not have one) for your aftercare needs so that they can reassess your need for medications and monitor your lab values.  Discharge Instructions    Call MD for:  difficulty breathing, headache or visual disturbances   Complete by: As directed    Call MD for:  persistant dizziness or light-headedness   Complete by: As directed    Call MD for:  persistant nausea and vomiting   Complete by: As directed    Diet - low sodium heart healthy   Complete by: As directed    Discharge instructions   Complete by: As directed    Follow-up primary care provider in 1-2 weeks, primary care provider to check Depakote level prior to outpatient neurology follow-up, also check B12 level  Per neurology: Updated recommendations: -Continue Depakote 500 twice daily -Check Depakote levels prior to outpatient visit.>>>> -Follow-up with outpatient neurology in 4 to 8 weeks.   Increase activity slowly   Complete by: As directed      Allergies as of 08/02/2019      Reactions   Metaxalone Other (See Comments)   "It made me not feel right"      Medication List    TAKE these medications   aspirin EC 81 MG tablet Take 81 mg by mouth daily.   Docusate Sodium 100 MG capsule Take 100 mg by mouth every evening.   ibuprofen 200 MG tablet Commonly known as: ADVIL Take 200 mg by mouth every 6 (six) hours as needed for headache, mild pain or moderate pain.   Lumigan 0.01 % Soln Generic drug: bimatoprost Place 1 drop into both eyes at bedtime.   metFORMIN 500 MG tablet Commonly known as:  GLUCOPHAGE Take 500 mg by mouth 2 (two) times daily with a meal.   PreserVision AREDS 2 Caps Take 1 capsule by mouth 2 (two) times daily.   valproic acid 250 MG capsule Commonly known as: DEPAKENE Take 2 capsules (500 mg total) by mouth 2 (two) times daily.   valsartan-hydrochlorothiazide 80-12.5 MG tablet Commonly known as: DIOVAN-HCT Take 1 tablet by mouth daily. What changed: how much to take   vitamin B-12 500 MCG tablet Commonly known as: CYANOCOBALAMIN Take 1 tablet (500 mcg total) by mouth daily.   VITAMIN D-3 PO Take 1 capsule by mouth daily after supper.      Allergies  Allergen Reactions  . Metaxalone Other (See Comments)    "It made me not feel right"      The results of significant diagnostics from this hospitalization (including imaging, microbiology, ancillary and laboratory) are listed below for reference.    Significant Diagnostic Studies: EEG  Result Date: 08/02/2019 Lora Havens, MD     08/02/2019  9:29 AM Patient Name: Suzanne Cantu MRN: [id number] Epilepsy Attending: Marcelle Overlie  Annabelle Harman Referring Physician/Provider: Dr Milon Dikes Date:08/02/2019 Duration: 24.32 mins Patient history: 86 year old past history of diabetes hypertension, MCI presented for evaluation of confusion and left-sided weakness. EEG to evaluate for seizure.  Level of alertness: awake  AEDs during EEG study: None  Technical aspects: This EEG study was done with scalp electrodes positioned according to the 10-20 International system of electrode placement. Electrical activity was acquired at a sampling rate of 500Hz  and reviewed with a high frequency filter of 70Hz  and a low frequency filter of 1Hz . EEG data were recorded continuously and digitally stored.  DESCRIPTION: The posterior dominant rhythm consists of 8 Hz activity of moderate voltage (25-35 uV) seen predominantly in posterior head regions, symmetric and reactive to eye opening and eye closing. EEG also showed intermittent  rhythmic generalized, maximal right temporal, 2-3hz  delta slowing. Hyperventilation and photic stimulation were not performed. ABNORMALITY - Intermittent rhythmic slow, generalized and maximal right temporal IMPRESSION: This study is suggestive of non specific cortical dysfunction in right temporal region as well as mild diffuse encephalopathy, non specific to etiology.  No seizures or epileptiform discharges were seen throughout the recording. Of note, continuous right hemispheric slowing seen during previous study on 08/01/2019 appears to have resolved.   DG Chest 1 View  Result Date: 08/01/2019 CLINICAL DATA:  86 year old female with a history of cough EXAM: CHEST  1 VIEW COMPARISON:  03/12/2019 FINDINGS: Cardiomediastinal silhouette unchanged in size and contour. Calcifications of the aortic arch. Pleuroparenchymal thickening at the bilateral apices is unchanged. No pneumothorax. Blunting of the left costophrenic angle, likely scarring/atelectasis. No evidence of interlobular septal thickening confluent airspace disease, or large pleural effusion. No displaced fracture IMPRESSION: Chronic lung changes without evidence of acute cardiopulmonary disease. Electronically Signed   By: 08/03/2019 D.O.   On: 08/01/2019 10:41   MR BRAIN WO CONTRAST  Result Date: 07/31/2019 CLINICAL DATA:  86 year old female code stroke presentation, left side weakness. Although clinical exam more suggestive of hypertensive urgency/emergency. EXAM: MRI HEAD WITHOUT CONTRAST TECHNIQUE: Multiplanar, multiecho pulse sequences of the brain and surrounding structures were obtained without intravenous contrast. COMPARISON:  Head CT 1759 hours today. FINDINGS: Brain: The only restricted diffusion identified is a punctate area of the left posterior cingulate gyrus best seen on series 5, image 82, but also on coronal diffusion series 7, image 49. Widespread and confluent bilateral cerebral white matter T2 and FLAIR  hyperintensity, most resembling chronic small vessel ischemia in the bilateral corona radiata on series 10, image 17. Comparatively mild T2 heterogeneity in the deep gray nuclei. No chronic cerebral hemisphere blood products although there is evidence of a chronic microhemorrhage in the right cerebellum deep cerebellar nuclei series 14, image 15. No cortical encephalomalacia identified. No midline shift, mass effect, evidence of mass lesion, ventriculomegaly, extra-axial collection or acute intracranial hemorrhage. Cervicomedullary junction and pituitary are within normal limits. Vascular: Major intracranial vascular flow voids are preserved. Skull and upper cervical spine: Negative for age visible cervical spine. Visualized bone marrow signal is within normal limits. Sinuses/Orbits: Postoperative changes to both globes otherwise negative orbits. Paranasal Visualized paranasal sinuses and mastoids are stable and well pneumatized. Other: Visible internal auditory structures appear normal. IMPRESSION: 1. Punctate acute lacunar infarct in the posterior left cingulate gyrus, perhaps reflecting asymptomatic small vessel disease in this clinical setting. No associated hemorrhage or mass effect. 2. No other acute intracranial abnormality. Underlying advanced chronic small vessel ischemia. Study discussed by telephone with Dr. 08/03/2019 on 07/31/2019 at 1840 hours.  Electronically Signed   By: Odessa Fleming M.D.   On: 07/31/2019 19:02   EEG adult  Result Date: 08/01/2019 Charlsie Quest, MD     08/01/2019 10:14 AM Patient Name: Suzanne Cantu MRN: [id number] Epilepsy Attending: Charlsie Quest Referring Physician/Provider: Date: 07/31/2019 Duration: 24.55 mins Patient history: 86 year old past history of diabetes hypertension, MCI presented for evaluation of confusion and left-sided weakness. EEG to evaluate for seizure. Level of alertness: awake AEDs during EEG study: None Technical aspects: This EEG study was done with  scalp electrodes positioned according to the 10-20 International system of electrode placement. Electrical activity was acquired at a sampling rate of 500Hz  and reviewed with a high frequency filter of 70Hz  and a low frequency filter of 1Hz . EEG data were recorded continuously and digitally stored. DESCRIPTION: No clear posterior dominant rhythm was seen. EEG showed continuous generalized and lateralized right hemisphere 3-6hz  theta-delta slowing.  Hyperventilation and photic stimulation were not performed. ABNORMALITY - Continuous slow, generalized and lateralized right hemisphere IMPRESSION: This study is suggestive of cortical dysfunction in right hemisphere, non specific to etiology. No seizures or epileptiform discharges were seen throughout the recording.   CT HEAD CODE STROKE WO CONTRAST  Result Date: 07/31/2019 CLINICAL DATA:  Code stroke. 86 year old female with left side drift and slurred speech. EXAM: CT HEAD WITHOUT CONTRAST TECHNIQUE: Contiguous axial images were obtained from the base of the skull through the vertex without intravenous contrast. COMPARISON:  03/12/2019 head CT. FINDINGS: Brain: No acute intracranial hemorrhage identified. No midline shift, mass effect, or evidence of intracranial mass lesion. Stable cerebral volume. No ventriculomegaly. Patchy and confluent bilateral white matter hypodensity appears stable. No cortically based acute infarct identified. Deep gray nuclei remain relatively spared. Posterior fossa appears stable and negative. Vascular: Extensive Calcified atherosclerosis at the skull base. Severe chronic calcified plaque in the proximal right MCA. No suspicious intracranial vascular hyperdensity. Skull: No acute osseous abnormality identified. Sinuses/Orbits: Visualized paranasal sinuses and mastoids are stable and well pneumatized. Other: No acute orbit or scalp soft tissue findings. ASPECTS [facility] Stroke Program Early CT Score) Total score (0-10  with 10 being normal): 10 IMPRESSION: 1. Stable non contrast CT appearance of the brain. No acute cortically based infarct or acute intracranial hemorrhage identified. ASPECTS 10. 2. Severe chronic intracranial atherosclerosis, including right M1 involvement. Chronic white matter small vessel disease. 3. These results were communicated to Dr. 95 at 6:05 pmon 1/28/2021by text page via the [facility] messaging system. Electronically Signed   By: Wilford Corner M.D.   On: 07/31/2019 18:06    Microbiology: Recent Results (from the past 240 hour(s))  SARS CORONAVIRUS 2 (TAT 6-24 HRS) Nasopharyngeal Nasopharyngeal Swab     Status: None   Collection Time: 07/31/19  7:20 PM   Specimen: Nasopharyngeal Swab  Result Value Ref Range Status   SARS Coronavirus 2 NEGATIVE NEGATIVE Final    Comment: (NOTE) SARS-CoV-2 target nucleic acids are NOT DETECTED. The SARS-CoV-2 RNA is generally detectable in upper and lower respiratory specimens during the acute phase of infection. Negative results do not preclude SARS-CoV-2 infection, do not rule out co-infections with other pathogens, and should not be used as the sole basis for treatment or other patient management decisions. Negative results must be combined with clinical observations, patient history, and epidemiological information. The expected result is Negative. Fact Sheet for Patients: Odessa Fleming Fact Sheet for Healthcare Providers: 08/02/2019 This test is not yet approved or cleared by the 08/02/19 FDA and  has  been authorized for detection and/or diagnosis of SARS-CoV-2 by FDA under an Emergency Use Authorization (EUA). This EUA will remain  in effect (meaning this test can be used) for the duration of the COVID-19 declaration under Section 56 4(b)(1) of the Act, 21 U.S.C. section 360bbb-3(b)(1), unless the authorization is terminated or revoked sooner. Performed at [facility],  [address] [place], [place] [address]      Labs: Basic Metabolic Panel: Recent Labs  Lab 07/31/19 1808 07/31/19 1812 08/01/19 0453 08/02/19 0606  NA 130* 129* 131* 136  K 4.1 4.0 3.7 3.9  CL 91* 91* 92* 98  CO2 28  --  29 28  GLUCOSE 151* 149* 137* 145*  BUN 9 11 7* 6*  CREATININE 0.61 0.60 0.69 0.64  CALCIUM 9.9  --  9.4 9.4  MG  --   --  1.5* 1.7   Liver Function Tests: Recent Labs  Lab 07/31/19 1808  AST 20  ALT 12  ALKPHOS 56  BILITOT 0.2*  PROT 6.9  ALBUMIN 3.9   No results for input(s): LIPASE, AMYLASE in the last 168 hours. No results for input(s): AMMONIA in the last 168 hours. CBC: Recent Labs  Lab 07/31/19 1808 07/31/19 1812 08/01/19 0453 08/02/19 0606  WBC 7.0  --  7.3 6.7  NEUTROABS 4.2  --   --   --   HGB 13.8 14.3 13.1 12.9  HCT 41.5 42.0 38.5 38.1  MCV 91.4  --  90.0 89.6  PLT 193  --  202 172   Cardiac Enzymes: No results for input(s): CKTOTAL, CKMB, CKMBINDEX, TROPONINI in the last 168 hours. BNP: BNP (last 3 results) No results for input(s): BNP in the last 8760 hours.  ProBNP (last 3 results) No results for input(s): PROBNP in the last 8760 hours.  CBG: Recent Labs  Lab 08/01/19 2015 08/01/19 2347 08/02/19 0424 08/02/19 0734 08/02/19 1206  GLUCAP 198* 122* 99 144* 136*       Signed:  Myrtie NeitherNwannadiya Tiandre Teall, MD [facility] 08/02/2019, 2:12 PM

## 2019-08-02 NOTE — Progress Notes (Addendum)
Neurology Progress Note   S:// Patient seen and examined.  Reports no overnight complaints.   O:// Current vital signs: BP (!) 164/70 (BP Location: Right Arm)   Pulse 80   Temp 97.6 F (36.4 C) (Oral)   Resp 18   Ht 5\' 2"  (1.575 m)   Wt 47.6 kg   SpO2 100%   BMI 19.20 kg/m  Vital signs in last 24 hours: Temp:  [97.6 F (36.4 C)-98.5 F (36.9 C)] 97.6 F (36.4 C) (01/30 0738) Pulse Rate:  [72-84] 80 (01/30 0738) Resp:  [17-18] 18 (01/30 0738) BP: (151-181)/(63-84) 164/70 (01/30 0738) SpO2:  [97 %-100 %] 100 % (01/30 0738) Weight:  [47.6 kg] 47.6 kg (01/30 0003) Exam unchanged from yesterday Today, she was sitting in bed waiting to eat her breakfast. Follows all commands No evidence of aphasia Able to tell me her name, she is in the hospital, her age, but not the correct date. Cranial: Pupils equal round react light, extract movements intact, visual fields full, face appears symmetric, palate and tongue are midline. Motor exam: Antigravity in all fours without drift. Sensory: Intact to light touch Coordination: No dysmetria Gait testing deferred  Medications  Current Facility-Administered Medications:  .  0.9 %  sodium chloride infusion, 250 mL, Intravenous, PRN, Cristescu, Mircea G, MD .  0.9 %  sodium chloride infusion, , Intravenous, Continuous, Cristescu, Mircea G, MD, Last Rate: 100 mL/hr at 08/01/19 1336, New Bag at 08/01/19 1336 .  aspirin EC tablet 81 mg, 81 mg, Oral, Daily, Cristescu, Mircea G, MD, 81 mg at 08/01/19 1132 .  cholecalciferol (VITAMIN D3) tablet 5,000 Units, 5,000 Units, Oral, QPC supper, Cristescu, Linard Millers, MD, 5,000 Units at 08/01/19 1836 .  enoxaparin (LOVENOX) injection 40 mg, 40 mg, Subcutaneous, Q24H, Cristescu, Mircea G, MD, 40 mg at 08/01/19 2130 .  hydrALAZINE (APRESOLINE) injection 5 mg, 5 mg, Intravenous, Q4H PRN, Cristescu, Mircea G, MD .  hydrochlorothiazide 10 mg/mL oral suspension 6.25 mg, 6.25 mg, Oral, Daily, Cristescu, Mircea G,  MD, 6.25 mg at 08/01/19 1045 .  insulin aspart (novoLOG) injection 0-9 Units, 0-9 Units, Subcutaneous, Q4H, Cristescu, Linard Millers, MD, 2 Units at 08/01/19 2028 .  irbesartan (AVAPRO) tablet 37.5 mg, 37.5 mg, Oral, Daily, Cristescu, Mircea G, MD, 37.5 mg at 08/01/19 1134 .  latanoprost (XALATAN) 0.005 % ophthalmic solution 1 drop, 1 drop, Both Eyes, QHS, Cristescu, Mircea G, MD, 1 drop at 08/01/19 2130 .  multivitamin with minerals tablet 1 tablet, 1 tablet, Oral, Daily, Cristescu, Linard Millers, MD, 1 tablet at 08/01/19 1132 .  ondansetron (ZOFRAN) tablet 4 mg, 4 mg, Oral, Q6H PRN **OR** ondansetron (ZOFRAN) injection 4 mg, 4 mg, Intravenous, Q6H PRN, Cristescu, Mircea G, MD .  sodium chloride flush (NS) 0.9 % injection 3 mL, 3 mL, Intravenous, Once, Nanavati, Ankit, MD .  sodium chloride flush (NS) 0.9 % injection 3 mL, 3 mL, Intravenous, Q12H, Cristescu, Mircea G, MD, 3 mL at 08/01/19 1045 .  sodium chloride flush (NS) 0.9 % injection 3 mL, 3 mL, Intravenous, Q12H, Cristescu, Mircea G, MD .  sodium chloride flush (NS) 0.9 % injection 3 mL, 3 mL, Intravenous, PRN, Cristescu, Mircea G, MD .  valproic acid (DEPAKENE) 250 MG capsule 500 mg, 500 mg, Oral, BID, Amie Portland, MD, 500 mg at 08/01/19 2130 Labs CBC    Component Value Date/Time   WBC 6.7 08/02/2019 0606   RBC 4.25 08/02/2019 0606   HGB 12.9 08/02/2019 0606   HCT 38.1 08/02/2019 0606  PLT 172 08/02/2019 0606   MCV 89.6 08/02/2019 0606   MCH 30.4 08/02/2019 0606   MCHC 33.9 08/02/2019 0606   RDW 12.8 08/02/2019 0606   LYMPHSABS 2.0 07/31/2019 1808   MONOABS 0.5 07/31/2019 1808   EOSABS 0.2 07/31/2019 1808   BASOSABS 0.0 07/31/2019 1808    CMP     Component Value Date/Time   NA 136 08/02/2019 0606   K 3.9 08/02/2019 0606   CL 98 08/02/2019 0606   CO2 28 08/02/2019 0606   GLUCOSE 145 (H) 08/02/2019 0606   BUN 6 (L) 08/02/2019 0606   CREATININE 0.64 08/02/2019 0606   CALCIUM 9.4 08/02/2019 0606   PROT 6.9 07/31/2019 1808    ALBUMIN 3.9 07/31/2019 1808   AST 20 07/31/2019 1808   ALT 12 07/31/2019 1808   ALKPHOS 56 07/31/2019 1808   BILITOT 0.2 (L) 07/31/2019 1808   GFRNONAA >60 08/02/2019 0606   GFRAA >60 08/02/2019 0606   Vitamin B12 365 TSH 2.4 Urinalysis unremarkable for UTI  Imaging I have reviewed images in epic and the results pertinent to this consultation are: MRI brain with a punctate high left frontal area of restricted diffusion-likely an incidental stroke with no bearing on the current presentation.  No bleed.  Chest x-ray with no acute changes reported  Assessment: 86 year old past medical history of hypertension and MCI, presented for confusion and left-sided weakness.  Initial exam are consistent with encephalopathy. MRI with a left frontal punctate infarct-does not fit the clinical picture of left-sided weakness or confusion. Systolic blood pressures on arrival were 200s. Family reports similar presentation in the past with confusion and left-sided weakness, attributed to high blood pressure at that time. Hypertensive emergency/urgency remains in the differentials but EEG showed generalized slowing as well as right-sided slowing which raises a concern for underlying seizure activity. Due to the multiple stereotypical spells, I have started her on antiepileptics/Depakote. No need to do a stroke work-up. We will do a follow-up EEG  Impression: -Likely underlying seizure in a patient with cognitive impairment causing confusion and postictal left-sided weakness -Hypertensive urgency/encephalopathy  Recommendations: -Spot EEG now -Continue Depakote 500 twice daily -Blood pressure management per primary team -Although B12 is in range for normal values, it is towards the lower end-I would supplement B12 to keep lab values above 400.  Seizure precautions as below: Per [facility] statutes, patients with seizures are not allowed to drive until they have been seizure-free for six  months.   Use caution when using heavy equipment or power tools. Avoid working on ladders or at heights. Take showers instead of baths. Ensure the water temperature is not too high on the home water heater. Do not go swimming alone. Do not lock yourself in a room alone (i.e. bathroom). When caring for infants or small children, sit down when holding, feeding, or changing them to minimize risk of injury to the child in the event you have a seizure. Maintain good sleep hygiene. Avoid alcohol.   If patient has another seizure, call 911 and bring them back to the ED if: A. The seizure lasts longer than 5 minutes.  B. The patient doesn't wake shortly after the seizure or has new problems such as difficulty seeing, speaking or moving following the seizure C. The patient was injured during the seizure D. The patient has a temperature over 102 F (39C) E. The patient vomited during the seizure and now is having trouble breathing   -- Milon Dikes, MD [facility] Pager:  [phone] If 7pm to 7am, please call on call as listed on AMION.  Addendum Follow-up EEG completed.  Rhythmic right-sided slowing has resolved.  There is generalized slowing which is maximal in the right temporal area. Today's valproate level is 61.  Updated recommendations: -Continue Depakote 500 twice daily -Check Depakote levels prior to outpatient visit. -Blood pressure control per primary team as you are -Replenish B12 -Maintain seizure precautions -Follow-up with outpatient neurology in 4 to 8 weeks. Neurology will be available as needed.  Please call with questions -- Milon Dikes, MD [facility] Pager: [phone] If 7pm to 7am, please call on call as listed on AMION.

## 2019-08-02 NOTE — Progress Notes (Signed)
Pt discharged, peripheral IV's removed, telemetry discontinue, AVS education provided to daughter [name] by telephone. All questions addressed, pt discharged by wheelchair to [facility].

## 2019-08-02 NOTE — Procedures (Signed)
Patient Name: Suzanne Cantu  MRN: [id number]  Epilepsy Attending: Charlsie Quest  Referring Physician/Provider: Dr Milon Dikes Date:08/02/2019 Duration: 24.32 mins  Patient history: 86 year old past history of diabetes hypertension, MCI presented for evaluation of confusion and left-sided weakness. EEG to evaluate for seizure.   Level of alertness: awake  AEDs during EEG study: None  Technical aspects: This EEG study was done with scalp electrodes positioned according to the 10-20 International system of electrode placement. Electrical activity was acquired at a sampling rate of 500Hz  and reviewed with a high frequency filter of 70Hz  and a low frequency filter of 1Hz . EEG data were recorded continuously and digitally stored.   DESCRIPTION: The posterior dominant rhythm consists of 8 Hz activity of moderate voltage (25-35 uV) seen predominantly in posterior head regions, symmetric and reactive to eye opening and eye closing. EEG also showed intermittent rhythmic generalized, maximal right temporal, 2-3hz  delta slowing. Hyperventilation and photic stimulation were not performed.  ABNORMALITY - Intermittent rhythmic slow, generalized and maximal right temporal  IMPRESSION: This study is suggestive of non specific cortical dysfunction in right temporal region as well as mild diffuse encephalopathy, non specific to etiology.  No seizures or epileptiform discharges were seen throughout the recording.  Of note, continuous right hemispheric slowing seen during previous study on 08/01/2019 appears to have resolved.   Suzanne Cantu 

## 2019-08-05 ENCOUNTER — Other Ambulatory Visit: Payer: Self-pay | Admitting: *Deleted

## 2019-08-05 NOTE — Patient Outreach (Signed)
[facility]) Care Management  08/05/2019  Suzanne Cantu 04-23-1927 [id number]   EMMI-stroke [facility] CHF-08/02/19 RED ON EMMI ALERT (NOT on APL) Day # 1 Date: Monday August 04 2019 1000  Red Alert Reason: Been able to take every dose of meds? No Problems setting up rehab? Yes   Insurance: united healthcare medicare   [facility] admissions x  ED visits x in the last 6 months  Last admission 07/31/19-08/02/19  Outreach attempt # 1 successful  Patient's daughter [name] is able to verify HIPAA, DOB and address [facility] Care Management RN reviewed and addressed red alert with patient   EMMI:  Been able to take every dose of meds? No [name] reports that this EMMI answer is correct  [name] reports [name] is experiencing low blood pressure (BP) values although her BP was elevated while hospitalized.  She was ordered Diovan-HCT 80-12.5 mg daily (qd) [name] is taking and documenting the BP values at home She has contacted Dr Juel Burrow to report the low BP values and has been instructed to not give the Diovan with low values. She was instructed to give half the dose of Diovan for BP values only if systolic blood pressure (SBP) is over 140. Today the SBP values have all been below 140    [name] had to conclude the contact to assist her brother in law with concerns with [name]. [name] reports she will return a call to [facility] RN CM   Social: [name] is a 86 year old patient who lives at home with support of her daughter, [name] for transportation, meals and medication administration. She is presenting needing assistance with her care needs and   Conditions:Encephalopathy,  Left sided weakness, slurred speech, essential hypertension, hypertensive emergency/urgency, diabetes mellitus type 2, chronic back pain  DME: BP cuff    Consent: [facility] RN CM reviewed [facility] services with patient. Patient gave verbal consent for services [facility] telephonic RN CM.   Advised  patient that there will be further automated EMMI- post discharge calls to assess how the patient is doing following the recent hospitalization Advised the patient that another call may be received from a nurse if any of their responses were abnormal. Patient voiced understanding and was appreciative of f/u call.   Plan: [facility] RN CM will follow up with [name] within 2 days if no return call from her or her daughter   [name]. Noelle Penner, RN, BSN, CCM [facility] Telephonic Care Management Care Coordinator Office number [phone] Mobile number [phone]  Main [facility] number [phone] Fax number [phone]

## 2019-08-06 ENCOUNTER — Emergency Department
Admission: EM | Admit: 2019-08-06 | Discharge: 2019-08-07 | Disposition: A | Discharge status: Home / Self Care | Payer: Medicare Other | Attending: Emergency Medicine | Admitting: Emergency Medicine

## 2019-08-06 ENCOUNTER — Emergency Department: Payer: Medicare Other

## 2019-08-06 ENCOUNTER — Other Ambulatory Visit: Payer: Self-pay

## 2019-08-06 ENCOUNTER — Other Ambulatory Visit: Payer: Self-pay | Admitting: *Deleted

## 2019-08-06 DIAGNOSIS — Z7984 Long term (current) use of oral hypoglycemic drugs: Secondary | ICD-10-CM | POA: Insufficient documentation

## 2019-08-06 DIAGNOSIS — E119 Type 2 diabetes mellitus without complications: Secondary | ICD-10-CM | POA: Diagnosis not present

## 2019-08-06 DIAGNOSIS — R0789 Other chest pain: Secondary | ICD-10-CM | POA: Diagnosis present

## 2019-08-06 DIAGNOSIS — Z7982 Long term (current) use of aspirin: Secondary | ICD-10-CM | POA: Diagnosis not present

## 2019-08-06 DIAGNOSIS — I10 Essential (primary) hypertension: Secondary | ICD-10-CM | POA: Insufficient documentation

## 2019-08-06 DIAGNOSIS — Z79899 Other long term (current) drug therapy: Secondary | ICD-10-CM | POA: Insufficient documentation

## 2019-08-06 DIAGNOSIS — I4891 Unspecified atrial fibrillation: Secondary | ICD-10-CM | POA: Diagnosis not present

## 2019-08-06 LAB — CBC WITH DIFFERENTIAL/PLATELET
Abs Immature Granulocytes: 0.02 10*3/uL (ref 0.00–0.07)
Basophils Absolute: 0 10*3/uL (ref 0.0–0.1)
Basophils Relative: 0 %
Eosinophils Absolute: 0.1 10*3/uL (ref 0.0–0.5)
Eosinophils Relative: 3 %
HCT: 38.1 % (ref 36.0–46.0)
Hemoglobin: 13.2 g/dL (ref 12.0–15.0)
Immature Granulocytes: 0 %
Lymphocytes Relative: 27 %
Lymphs Abs: 1.4 10*3/uL (ref 0.7–4.0)
MCH: 30.5 pg (ref 26.0–34.0)
MCHC: 34.6 g/dL (ref 30.0–36.0)
MCV: 88 fL (ref 80.0–100.0)
Monocytes Absolute: 0.5 10*3/uL (ref 0.1–1.0)
Monocytes Relative: 10 %
Neutro Abs: 3.2 10*3/uL (ref 1.7–7.7)
Neutrophils Relative %: 60 %
Platelets: 191 10*3/uL (ref 150–400)
RBC: 4.33 MIL/uL (ref 3.87–5.11)
RDW: 12.6 % (ref 11.5–15.5)
WBC: 5.3 10*3/uL (ref 4.0–10.5)
nRBC: 0 % (ref 0.0–0.2)

## 2019-08-06 LAB — COMPREHENSIVE METABOLIC PANEL
ALT: 14 U/L (ref 0–44)
AST: 24 U/L (ref 15–41)
Albumin: 3.4 g/dL — ABNORMAL LOW (ref 3.5–5.0)
Alkaline Phosphatase: 45 U/L (ref 38–126)
Anion gap: 12 (ref 5–15)
BUN: 11 mg/dL (ref 8–23)
CO2: 26 mmol/L (ref 22–32)
Calcium: 9.3 mg/dL (ref 8.9–10.3)
Chloride: 89 mmol/L — ABNORMAL LOW (ref 98–111)
Creatinine, Ser: 0.62 mg/dL (ref 0.44–1.00)
GFR calc Af Amer: >60 mL/min (ref >60)
GFR calc non Af Amer: >60 mL/min (ref >60)
Glucose, Bld: 182 mg/dL — ABNORMAL HIGH (ref 70–99)
Potassium: 3.7 mmol/L (ref 3.5–5.1)
Sodium: 127 mmol/L — ABNORMAL LOW (ref 135–145)
Total Bilirubin: 0.5 mg/dL (ref 0.3–1.2)
Total Protein: 6.3 g/dL — ABNORMAL LOW (ref 6.5–8.1)

## 2019-08-06 LAB — TROPONIN I (HIGH SENSITIVITY): Troponin I (High Sensitivity): 11 ng/L (ref <18)

## 2019-08-06 IMAGING — DX DG CHEST 1V PORT
1 series · 1 of 1 positions shown · non-contrast
Comparison: [DATE]

CLINICAL DATA: Chest pain and cardiac palpitations

EXAM:
PORTABLE CHEST 1 VIEW

[chest ap]
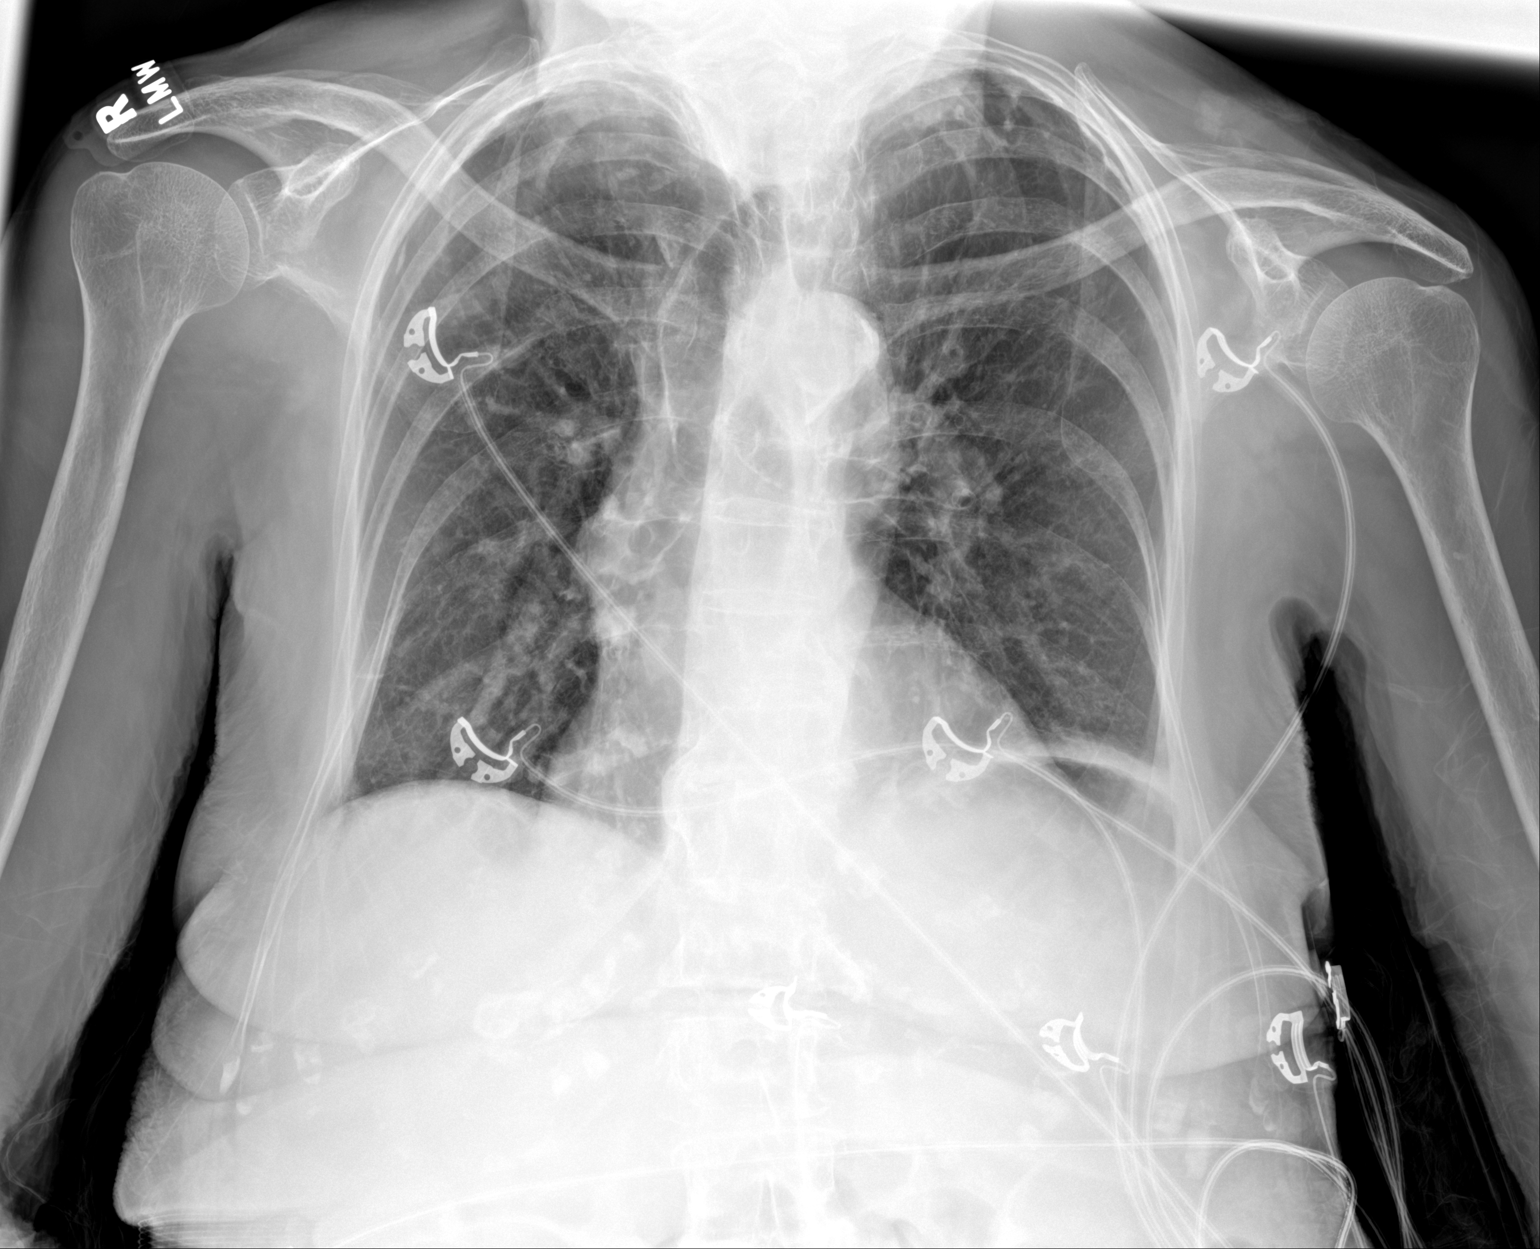

[1 of 1 positions shown; findings below may reference images not displayed]

FINDINGS: Cardiac shadow is stable. Aortic calcifications are again seen.
Biapical pleural and parenchymal scarring is noted. No focal
infiltrate is seen. No bony abnormality is noted.
IMPRESSION: No acute abnormality seen.

## 2019-08-06 MED ORDER — METOPROLOL SUCCINATE ER 50 MG PO TB24
25.0000 mg | ORAL_TABLET | Freq: Once | ORAL | Status: AC
  Administered 2019-08-06: 25 mg via ORAL
  Filled 2019-08-06: qty 1

## 2019-08-06 MED ORDER — ASPIRIN EC 81 MG PO TBEC
81.0000 mg | DELAYED_RELEASE_TABLET | Freq: Once | ORAL | Status: DC
  Filled 2019-08-06: qty 1

## 2019-08-06 NOTE — ED Provider Notes (Signed)
[facility] Emergency Department Provider Note  Time seen: 10:18 PM  I have reviewed the triage vital signs and the nursing notes.   HISTORY  Chief Complaint Chest Pain   HPI Suzanne Cantu is a 86 y.o. female with a past medical history of hypertension, diabetes, presents to the emergency department for chest discomfort.  According to EMS patient arrives from home complaining of some mild left-sided chest discomfort found to be in A. fib with RVR as high as 200 bpm.  EMS gave the patient a total of 5 mg of metoprolol and heart rate reduced to approximately 80 bpm upon arrival.  Patient appears to be in a normal sinus rhythm upon arrival.  Denies any discomfort at this time.  No shortness of breath.  Largely negative review of systems.   Past Medical History:  Diagnosis Date  . Diabetes mellitus without complication (HCC)   . Hypertension     Patient Active Problem List   Diagnosis Date Noted  . Encephalopathy acute 07/31/2019  . Hypertensive urgency 07/31/2019  . Essential hypertension 07/31/2019  . Type 2 diabetes mellitus with other specified complication (HCC) 07/31/2019  . Chronic back pain 07/31/2019  . Hypertensive emergency 07/31/2019  . Acute encephalopathy 07/31/2019    Past Surgical History:  Procedure Laterality Date  . ABDOMINAL HYSTERECTOMY    . CESAREAN SECTION     4  . CHOLECYSTECTOMY    . KIDNEY STONE SURGERY      Prior to Admission medications   Medication Sig Start Date End Date Taking? Authorizing Provider  aspirin EC 81 MG tablet Take 81 mg by mouth daily.    [provider]  bimatoprost (LUMIGAN) 0.01 % SOLN Place 1 drop into both eyes at bedtime.    [provider]  Cholecalciferol (VITAMIN D-3 PO) Take 1 capsule by mouth daily after supper.    [provider]  Docusate Sodium 100 MG capsule Take 100 mg by mouth every evening.    [provider]  ibuprofen (ADVIL) 200 MG tablet Take  200 mg by mouth every 6 (six) hours as needed for headache, mild pain or moderate pain.     [provider]  metFORMIN (GLUCOPHAGE) 500 MG tablet Take 500 mg by mouth 2 (two) times daily with a meal.     [provider]  Multiple Vitamins-Minerals (PRESERVISION AREDS 2) CAPS Take 1 capsule by mouth 2 (two) times daily.    [provider]  valproic acid (DEPAKENE) 250 MG capsule Take 2 capsules (500 mg total) by mouth 2 (two) times daily. 08/02/19   Myrtie Neither, MD  valsartan-hydrochlorothiazide (DIOVAN-HCT) 80-12.5 MG tablet Take 1 tablet by mouth daily. 08/02/19   Myrtie Neither, MD  vitamin B-12 (CYANOCOBALAMIN) 500 MCG tablet Take 1 tablet (500 mcg total) by mouth daily. 08/02/19   Myrtie Neither, MD    Allergies  Allergen Reactions  . Metaxalone Other (See Comments)    "It made me not feel right"    No family history on file.  Social History Social History   Tobacco Use  . Smoking status: Never Smoker  . Smokeless tobacco: Never Used  Substance Use Topics  . Alcohol use: Never  . Drug use: Not on file    Review of Systems Constitutional: Negative for fever. Cardiovascular: Chest discomfort 8/10 dull aching pain which has since resolved. Respiratory: Negative for shortness of breath. Gastrointestinal: Negative for abdominal pain, vomiting Musculoskeletal: Negative for musculoskeletal complaints Neurological: Negative for headache All  other ROS negative  ____________________________________________   PHYSICAL EXAM:  VITAL SIGNS: ED Triage Vitals  Enc Vitals Group     BP 08/06/19 2145 135/66     Pulse Rate 08/06/19 2145 82     Resp 08/06/19 2145 18     Temp 08/06/19 2145 97.8 F (36.6 C)     Temp Source 08/06/19 2145 Oral     SpO2 08/06/19 2133 99 %     Weight 08/06/19 2139 118 lb 9.7 oz (53.8 kg)     Height 08/06/19 2139 5' (1.524 m)     Head Circumference --      Peak Flow --      Pain Score 08/06/19 2138 0     Pain Loc --       Pain Edu? --      Excl. in GC? --    Constitutional: Alert and oriented. Well appearing and in no distress. Eyes: Normal exam ENT      Head: Normocephalic and atraumatic.      Mouth/Throat: Mucous membranes are moist. Cardiovascular: Normal rate, regular rhythm. Respiratory: Normal respiratory effort without tachypnea nor retractions. Breath sounds are clear  Gastrointestinal: Soft and nontender. No distention. Musculoskeletal: Nontender with normal range of motion in all extremities.  Neurologic:  Normal speech and language. No gross focal neurologic deficits  Skin:  Skin is warm, dry and intact.  Psychiatric: Mood and affect are normal.   ____________________________________________    EKG  EKG viewed and interpreted by myself shows a normal sinus rhythm at 79 bpm with a narrow QRS, left axis deviation, largely normal intervals nonspecific ST changes.  ____________________________________________    RADIOLOGY  IMPRESSION: No acute abnormality seen.  ____________________________________________   INITIAL IMPRESSION / ASSESSMENT AND PLAN / ED COURSE  Pertinent labs & imaging results that were available during my care of the patient were reviewed by me and considered in my medical decision making (see chart for details).   Patient presents emergency department for chest discomfort found to be in A. fib with RVR by EMS.  Patient given 2.5 mg of metoprolol x2 and appears to have converted back to a normal sinus rhythm currently around 80 bpm.  Patient denies any chest discomfort.  We will check labs including cardiac enzymes and continue to closely monitor the patient in the emergency department.  Patient agreeable to plan of care.  Patient remains in a normal sinus rhythm around 80 bpm patient's sodium level is somewhat low however in reviewing her old labs she chronically runs somewhat low.  I spoke to Dr. Juliann Pares of cardiology.  He recommends dosing metoprolol succinate  25 mg orally, have the patient take an aspirin daily and he will see her in the office at 9 AM tomorrow.  Patient is very agreeable to this plan, she appears well otherwise denies any chest pain or shortness of breath and continues to have reassuring vitals in the emergency department.  Suzanne Cantu was evaluated in Emergency Department on 08/06/2019 for the symptoms described in the history of present illness. She was evaluated in the context of the global COVID-19 pandemic, which necessitated consideration that the patient might be at risk for infection with the SARS-CoV-2 virus that causes COVID-19. Institutional protocols and algorithms that pertain to the evaluation of patients at risk for COVID-19 are in a state of rapid change based on information released by regulatory bodies including the CDC and federal and state organizations. These policies and algorithms were followed during the patient's  care in the ED.  ____________________________________________   FINAL CLINICAL IMPRESSION(S) / ED DIAGNOSES  Atrial fibrillation with rapid ventricular response   Harvest Dark, MD 08/06/19 2303

## 2019-08-06 NOTE — Patient Outreach (Signed)
[facility]) Care Management  08/06/2019  Suzanne Cantu 02/20/1927 [id number]   [facility] further telephonic assessment for EMMI-stroke referred patient [facility]-08/02/19 RED ON EMMI ALERT (NOT on APL) Day # 1 Date: Monday August 04 2019 1000  Red Alert Reason: Been able to take every dose of meds? No Problems setting up rehab? Yes   Insurance: united healthcare medicare   [facility] admissions x 1  ED visits x 1 in the last 6 months  Last admission 07/31/19-08/02/19  Outreach # 2 successful to daughter, Suzanne Cantu. (after [facility] RN CM received a voice message left by Suzanne Cantu daughter)  EMMI Further EMMI assessment for problems setting up rehab? Suzanne Cantu confirms the answer of yes is correct [facility] RN CM discussed that home health orders should now be ordered by the primary care provider (PCP) since hospital discharge. Suzanne Cantu is scheduled for primary care provider (PCP) Follow up (f/u) With Dr Lavera Guise on Thursday, 08/14/19. [facility] RN CM sent an epic in basket message to Dr Lavera Guise about inquiry about home health for patient and concerns voiced with increased sleeping possibly related to Depakene. Side effect of Depakene is drowsiness.    Questionable side effects of Depakene Suzanne Cantu clarifies that Suzanne Cantu was dx with seizures She reports a stroke was ruled out Suzanne Cantu discussed that she believes Suzanne Cantu may be having side effects of the Depakene to include drowsiness, fatigue, and weakness that has been noticed for "the last few days" Suzanne Cantu was ordered to start Depakene 500 mg bid on 08/02/19  Suzanne Cantu was reported to have only been awake for "three hours today" She was reported to "not be able to keep her eyes open."and it took a while to arouse her. She reports an episode on 08/05/19 night that Suzanne Cantu briefly reported seeing ants that were not present. . She confirms Suzanne Cantu has a fair appetite. Suzanne Cantu reports consulting her nephew who is a Software engineer.  Suzanne Cantu at discharge on 08/02/19 was reported as 104.94 lbs An Epic in basket message was sent to Dr Lavera Guise to discuss the concerns with Depakene side effects and inquiry about home health  Hypertension (HTN) Suzanne Cantu was reported to be able to take her Diovan on 08/05/19 night  Today's vital signs have been 129/76, 91 after breakfast and 134/83, 93 at 1255  Social:  Suzanne Cantu is a 86 year old patient who now lives at home (84 level) with support of her daughter, Suzanne Cantu (staying with her) for transportation, meals and medication administration. She is presenting needing assistance with her care needs  Prior to admission, Suzanne Cantu was reported to live alone, to be moderately  Independent with her Activities of daily living (ADLs) and cooking using a rolling walker. She had support from her daughter with  IADLs (instrumental Activities of Daily Living)   Conditions: Encephalopathy,  Left sided weakness, slurred speech, essential hypertension, hypertensive emergency/urgency, diabetes mellitus type 2, chronic back pain  DME BP cuff, rolling walker, ramp  Appointments 08/14/19 Dr Lavera Guise 08/26/19 Dr Manuella Ghazi Neurology- [place] [facility] 11/10/19 Dr Gayland Curry [facility]rology[facility]  Consent: [facility] RN CM reviewed [facility] services with patient. Patient gave verbal consent for services [facility] telephonic RN CM.   Advised patient that there will be further automated EMMI-post discharge calls to assess how the patient is doing following the recent hospitalization Advised the patient that another call may be received from a nurse if any of their responses  were abnormal. Patient voiced understanding and was appreciative of f/u call.  Plans [facility] RN CM will follow up with Suzanne Cantu and her daughter within the next 7-14 days   Pt encouraged to return a call to [facility] RN CM prn  Routed note to MD   Suzanne Bradford L. Noelle Penner, RN, BSN, CCM [facility] Telephonic Care Management Care Coordinator Office number [phone] Mobile number [phone]  Main [facility] number [phone] Fax number [phone]

## 2019-08-06 NOTE — ED Triage Notes (Signed)
Pt arrives to ED via [facility] from home with c/o CP. EMS reports pt to be found in uncontrolled A-fib on their arrival with HR as high as 213 bpm. EMS states pt has no h/x of same; was given 2.5mg  Metoprolol without effect, given a 2nd ose of 2.5mg  Metoprolol with HR reduced to WNL and HR of 78bpm. Pt initially reported 8/10 pain to EMS, pain resolved to 0/10 on arrival. Pt arrives A&O, in NAD; RR even, regular, and unlabored.

## 2019-08-06 NOTE — Discharge Instructions (Addendum)
Please follow-up with Dr. Juliann Pares at 9:00 in the morning tomorrow 08/07/2019.  Return to the emergency department for any further chest pain, shortness of breath, or any other symptom personally concerning to yourself.

## 2019-08-07 ENCOUNTER — Other Ambulatory Visit: Payer: Self-pay | Admitting: *Deleted

## 2019-08-07 NOTE — Patient Outreach (Signed)
[facility]) Care Management  08/07/2019  Suzanne Cantu [birth year] [id number]   [facility] EMMI-stroke referred patient Discharged from [facility]-3 E CHF-08/02/19 RED ON EMMI ALERT(NOT on APL) Day #3 Date:Wednesday August 06 2019 1000 Red Alert Reason:Questions/problems with meds? Yes   Insurance:united healthcare medicare [facility] admissions x 1 ED visits x 1 in the last 6 months  Last admission 07/31/19-08/02/19  [facility] RN CM spoke with [name] daughter, [name] on 08/06/19 1759 about the above EMMI red alert -Questions/problems with meds?  Questionable side effects of Depakene [name] clarifies that [name] was dx with seizures She reports a stroke was ruled out [name] discussed that she believes [name] may be having side effects of the Depakene to include drowsiness, fatigue, and weakness that has been noticed for "the last few days" [name] was ordered to start Depakene 500 mg bid on 08/02/19  [name] was reported to have only been awake for "three hours today" She was reported to "not be able to keep her eyes open."and it took a while to arouse her. She reports an episode on 08/05/19 night that [name] briefly reported seeing ants that were not present. . She confirms [name] has a fair appetite. [name] reports consulting her nephew who is a Teacher, early years/pre. [name] wt at discharge on 08/02/19 was reported as 104.94 lbs An Epic in basket message was sent to Dr Juel Burrow to discuss the concerns with Depakene side effects and inquiry about home health  Consent: [facility] RN CM reviewed [facility] services with patient. Patient gave verbal consent for services [facility] telephonic RN CM.   Advised patient that there will be further automated EMMI-post discharge calls to assess how the patient is doing following the recent hospitalization Advised the patient that another call may be received from a nurse if any of their responses were abnormal. Patient voiced understanding and was appreciative  of f/u call.  Plans [facility] RN CM will follow up with [name] and her daughter within the next 7-14 days   Pt encouraged to return a call to [facility] RN CM prn  [name], RN, BSN, CCM [facility] Telephonic Care Management Care Coordinator Office number [phone] Mobile number [phone]  Main [facility] number [phone] Fax number [phone]

## 2019-08-11 ENCOUNTER — Other Ambulatory Visit: Payer: Self-pay | Admitting: *Deleted

## 2019-08-11 NOTE — Patient Outreach (Signed)
[facility]) Care Management  08/11/2019  Suzanne Cantu April 02, 1927 [id number]   [facility] EMMI-strokereferred patient Discharged from [facility]-3 ECHF-08/02/19 RED ON EMMI ALERT(NOT on APL) Day #6 Date: Saturday August 09 2019 1000 Red Alert Reason:Questions/problems with meds? Yes   Insurance:united healthcare medicare [facility] x1ED visits x1in the last 6 months  Last admission 07/31/19-08/02/19  This is the third EMMI red alert for the patient in the last 6 days   Outreach attempt # 1 to the home number No answer. [facility] RN CM left HIPAA [facility] Portability and Accountability Act) compliant voicemail message along with CM's contact info.   Plan: [facility] RN CM scheduled this patient for another call attempt within 4 business days  Suzanne Milo L. Noelle Penner, RN, BSN, CCM [facility] Telephonic Care Management Care Coordinator Office number [phone] Mobile number [phone]  Main [facility] number [phone] Fax number [phone]

## 2019-08-11 NOTE — Patient Outreach (Signed)
  [facility]) Care Management  08/11/2019  Suzanne Cantu 1927/06/10 [id number]   This encounter opened in error  Kemar Pandit L. Noelle Penner, RN, BSN, CCM [facility] Telephonic Care Management Care Coordinator Office number [phone] Mobile number [phone]  Main [facility] number [phone] Fax number [phone]

## 2019-08-12 ENCOUNTER — Other Ambulatory Visit: Payer: Self-pay | Admitting: *Deleted

## 2019-08-12 NOTE — Patient Outreach (Signed)
[facility]) Care Management  08/12/2019  Suzanne Cantu March 13, 1927 [id number]  Return call from [facility] EMMI-strokereferred patient's daughter related to 08/09/19 Red Alert Discharged from [facility]-3 ECHF-08/02/19 RED ON EMMI ALERT(NOT on APL) Day #6 Date:SaturdayFebruary62021 1000 Red Alert Reason:Questions/problems with meds?Yes   Insurance:united healthcare medicare [facility] x1ED visits x1in the last 6 months  Last admission 07/31/19-08/02/19  This is the third EMMI red alert for the patient in the last 6 days  Patient's daughter [name] returned a call to [facility] RN CM She is able to verify HIPAA, DOB and Address Reviewed and addressed EMMI red alert with [name]  Consent: [facility] RN CM reviewed [facility] services with patient. Patient gave verbal consent for [facility] telephonic RN CM services.  [name] will return to [name] home next week to assist her by going to her medical appointments to include an EKG and neurology visit [name] voices appreciation of [facility] follow up   EMMI [name] confirms the medication concern was the seizure medication, Depakene. It was discontinued after a 08/06/19 ED visit in which [name] was in atrial fibrillation (new to patient) with heart rates in the 100-200s. Mrs Catano was seen in Dr Glennis Brink office on 08/07/19   Appointments 08/21/19 Dr Juliann Pares, cardiology 08/26/19 Dr Sherryll Burger neurology 08/28/19 Dr Juliann Pares, cardiology 11/10/19 Dr Sherryll Burger neurology  Plans  [facility] RN CM will follow up with [name] within the next 7-14 days Pt encouraged to return a call to [facility] RN CM prn Routed note to MD   [name], RN, BSN, CCM [facility] Telephonic Care Management Care Coordinator Office number [phone] Mobile number [phone]  Main [facility] number [phone] Fax number [phone]

## 2019-08-12 NOTE — Patient Outreach (Signed)
[facility]) Care Management  08/12/2019  Suzanne Cantu 07-17-1926 [id number]  [facility] EMMI-strokereferred patient Discharged from [facility]-3 ECHF-08/02/19 RED ON EMMI ALERT(NOT on APL) Day #6 Date: Saturday February 78295 1000 Red Alert Reason:Questions/problems with meds?Yes   Insurance:united healthcare medicare [facility] x1ED visits x1in the last 6 months  Last admission 07/31/19-08/02/19  This is the third EMMI red alert for the patient in the last 6 days   Outreach attempt to the highlighted mobile home number [phone]  Female answered a to confirm she was [name] daughter She informed [facility] RN CM to return a call to the home number as "my brother is with her today" Outreach to the home number [phone] A female answered and informed [facility] RN CM that "I'm the sitter and she just went to sleep" [facility] RN CM left HIPAA [employer] Portability and Accountability Act) compliant voicemail message along with CM's contact info.   Plan: [facility] RN CM sent an unsuccessful outreach letter and scheduled this engaged  patient for another call attempt within 4 business days   Mat Stuard L. Noelle Penner, RN, BSN, CCM [facility] Telephonic Care Management Care Coordinator Office number [phone] Mobile number [phone]  Main [facility] number [phone] Fax number [phone]

## 2019-08-15 ENCOUNTER — Ambulatory Visit: Payer: Self-pay | Admitting: *Deleted

## 2019-08-19 ENCOUNTER — Other Ambulatory Visit: Payer: Self-pay | Admitting: *Deleted

## 2019-08-19 ENCOUNTER — Other Ambulatory Visit: Payer: Self-pay

## 2019-08-19 NOTE — Patient Outreach (Signed)
[facility]) Care Management  08/19/2019  Suzanne Cantu Feb 25, 1927 [id number]   [facility] follow up for [facility] EMMI stroke referred patient  Discharged from [facility]-3 ECHF-08/02/19 RED ON EMMI ALERT(NOT on APL)  Out reach to her home number No answer. [facility] RN CM left HIPAA [facility] Portability and Accountability Act) compliant voicemail message along with CM's contact info.   Outreach to the mobile number listed for her (actual number for daughter Junious Dresser) no answer, Mail box full Out reach to Daughter Dolores Patty is able to verify HIPAA [facility] Portability and Accountability Act) identifiers, date of birth (DOB) and address Reviewed and addressed the purpose of the follow up call   Consent: [facility] ([facility] Customer service manager) RN CM reviewed [facility] services with patient. Patient gave verbal consent for services.   [name] reports Suzanne Mauriello is reported to be doing good and [name] will be visiting to assist with appointments on 08/21/19 and the rest of the week, Suzanne Loving has had her daughter, Junious Dresser, a cousin and a siter for 3 hours staying with her  Suzanne Mofield had red alerts for questions/problems with meds x 2  After outreach, review, suggestions and contact to providers, her medications for Hypertension (HTN) were and seizure management were adjusted with good results She remains off of Depakene and has not had to take Hypertension (HTN) medicine in the "last 4 days as her blood pressure (BP) has been below the parameters her providers have recommended  Swallowing issues [name] did discussed noted swallowing changes reported by Suzanne Kilman when swallowing pills especially her metformin. Metformin is not recommended to be crushable and [name] updated.  It is noted that she had a Speech swallowing evaluation on 08/01/19 during her last hospitalization that indicated not dysphagia. [name] shared that the is a family history of swallowing concerns in which several family members  have had to have stretching of the esophagus and issues with GERD (gastroesophageal reflux disease) initiating problems. [facility] RN CM answered questions. [facility] RN CM discussed attempts to try use of applesauce or pudding with difficult to swallow medications and prevention of aspiration. [facility] RN CM encouraged outreach to Dr Juel Burrow for possible Speech therapy re- evaluation, Ear, Nose and Throat (ENT) referral if issues persists. Pt noted not to be on GERD medicine and [name] confirms Suzanne Jeffcoat has used Alka seltzer chews in past to assist with GERD prn only [name] voiced appreciation  Social:  Suzanne Cantu is a 86 year old patient who now lives at home (2 level) with support of her daughter, [name] (staying with her) for transportation, meals and medication administration. She is presenting needing assistance with her care needs  Prior to admission, Suzanne Goracke was reported to live alone, to be moderately  Independent with her Activities of daily living (ADLs) and cooking using a rolling walker. She had support from her daughter with  IADLs (instrumental Activities of Daily Living)   Conditions: Encephalopathy, Left sided weakness, slurred speech, essential hypertension, hypertensive emergency/urgency,diabetes mellitus type 2, chronic backpain  DME BP cuff, rolling walker, ramp  Appointments 08/21/19 cardiology Dr Juliann Pares 08/22/19 1130 Dr Juel Burrow primary care provider (PCP) 08/26/19 Dr Sherryll Burger Neurology- [facility] 08/28/19 cardiology Dr Juliann Pares 11/10/19 Dr Harlin Rain [facility]rology- [facility]  Plan [facility] RN CM will close case at this time as patient has been assessed and no needs identified/needs resolved. - not an APL  Pt's daughter encouraged to return a call to [facility] RN CM prn  Routed note  to MDs/NP/PA   Joelene Millin L. Lavina Hamman, RN, BSN, [place] Coordinator Office number [phone] Mobile number [phone]  Main [facility] number [phone] Fax number  [phone]'

## 2019-10-27 ENCOUNTER — Emergency Department (HOSPITAL_COMMUNITY): Payer: Medicare Other

## 2019-10-27 ENCOUNTER — Encounter (HOSPITAL_COMMUNITY): Payer: Self-pay | Admitting: *Deleted

## 2019-10-27 ENCOUNTER — Inpatient Hospital Stay (HOSPITAL_COMMUNITY)
Admission: EM | Admit: 2019-10-27 | Discharge: 2019-10-29 | DRG: 065 | Disposition: A | Discharge status: Home Health | Payer: Medicare Other | Attending: Internal Medicine | Admitting: Internal Medicine

## 2019-10-27 ENCOUNTER — Inpatient Hospital Stay (HOSPITAL_COMMUNITY): Payer: Medicare Other

## 2019-10-27 ENCOUNTER — Other Ambulatory Visit (HOSPITAL_COMMUNITY): Payer: Medicare Other

## 2019-10-27 ENCOUNTER — Other Ambulatory Visit: Payer: Self-pay

## 2019-10-27 DIAGNOSIS — Z90710 Acquired absence of both cervix and uterus: Secondary | ICD-10-CM | POA: Diagnosis not present

## 2019-10-27 DIAGNOSIS — Z79899 Other long term (current) drug therapy: Secondary | ICD-10-CM

## 2019-10-27 DIAGNOSIS — W19XXXA Unspecified fall, initial encounter: Secondary | ICD-10-CM | POA: Diagnosis present

## 2019-10-27 DIAGNOSIS — I340 Nonrheumatic mitral (valve) insufficiency: Secondary | ICD-10-CM | POA: Diagnosis not present

## 2019-10-27 DIAGNOSIS — Z20822 Contact with and (suspected) exposure to covid-19: Secondary | ICD-10-CM | POA: Diagnosis present

## 2019-10-27 DIAGNOSIS — R569 Unspecified convulsions: Secondary | ICD-10-CM | POA: Diagnosis not present

## 2019-10-27 DIAGNOSIS — I63432 Cerebral infarction due to embolism of left posterior cerebral artery: Secondary | ICD-10-CM | POA: Diagnosis not present

## 2019-10-27 DIAGNOSIS — Z87442 Personal history of urinary calculi: Secondary | ICD-10-CM

## 2019-10-27 DIAGNOSIS — I639 Cerebral infarction, unspecified: Secondary | ICD-10-CM

## 2019-10-27 DIAGNOSIS — E1151 Type 2 diabetes mellitus with diabetic peripheral angiopathy without gangrene: Secondary | ICD-10-CM | POA: Diagnosis present

## 2019-10-27 DIAGNOSIS — E871 Hypo-osmolality and hyponatremia: Secondary | ICD-10-CM | POA: Diagnosis present

## 2019-10-27 DIAGNOSIS — R29706 NIHSS score 6: Secondary | ICD-10-CM | POA: Diagnosis not present

## 2019-10-27 DIAGNOSIS — F0150 Vascular dementia without behavioral disturbance: Secondary | ICD-10-CM | POA: Diagnosis present

## 2019-10-27 DIAGNOSIS — I10 Essential (primary) hypertension: Secondary | ICD-10-CM | POA: Diagnosis present

## 2019-10-27 DIAGNOSIS — R4701 Aphasia: Secondary | ICD-10-CM | POA: Diagnosis present

## 2019-10-27 DIAGNOSIS — I480 Paroxysmal atrial fibrillation: Secondary | ICD-10-CM | POA: Diagnosis present

## 2019-10-27 DIAGNOSIS — Z8673 Personal history of transient ischemic attack (TIA), and cerebral infarction without residual deficits: Secondary | ICD-10-CM

## 2019-10-27 DIAGNOSIS — Z7984 Long term (current) use of oral hypoglycemic drugs: Secondary | ICD-10-CM | POA: Diagnosis not present

## 2019-10-27 DIAGNOSIS — R627 Adult failure to thrive: Secondary | ICD-10-CM | POA: Diagnosis present

## 2019-10-27 DIAGNOSIS — R4189 Other symptoms and signs involving cognitive functions and awareness: Secondary | ICD-10-CM | POA: Diagnosis present

## 2019-10-27 DIAGNOSIS — Z91010 Allergy to peanuts: Secondary | ICD-10-CM

## 2019-10-27 DIAGNOSIS — E876 Hypokalemia: Secondary | ICD-10-CM | POA: Diagnosis present

## 2019-10-27 DIAGNOSIS — Z888 Allergy status to other drugs, medicaments and biological substances status: Secondary | ICD-10-CM

## 2019-10-27 DIAGNOSIS — I081 Rheumatic disorders of both mitral and tricuspid valves: Secondary | ICD-10-CM | POA: Diagnosis present

## 2019-10-27 DIAGNOSIS — R29707 NIHSS score 7: Secondary | ICD-10-CM | POA: Diagnosis present

## 2019-10-27 DIAGNOSIS — I63412 Cerebral infarction due to embolism of left middle cerebral artery: Secondary | ICD-10-CM | POA: Diagnosis present

## 2019-10-27 DIAGNOSIS — R29708 NIHSS score 8: Secondary | ICD-10-CM | POA: Diagnosis not present

## 2019-10-27 DIAGNOSIS — Z7982 Long term (current) use of aspirin: Secondary | ICD-10-CM

## 2019-10-27 DIAGNOSIS — G8191 Hemiplegia, unspecified affecting right dominant side: Secondary | ICD-10-CM | POA: Diagnosis present

## 2019-10-27 DIAGNOSIS — E860 Dehydration: Secondary | ICD-10-CM | POA: Diagnosis present

## 2019-10-27 DIAGNOSIS — Y92002 Bathroom of unspecified non-institutional (private) residence single-family (private) house as the place of occurrence of the external cause: Secondary | ICD-10-CM | POA: Diagnosis not present

## 2019-10-27 DIAGNOSIS — E119 Type 2 diabetes mellitus without complications: Secondary | ICD-10-CM | POA: Diagnosis not present

## 2019-10-27 LAB — CBC
HCT: 36 % (ref 36.0–46.0)
Hemoglobin: 11.8 g/dL — ABNORMAL LOW (ref 12.0–15.0)
MCH: 29.9 pg (ref 26.0–34.0)
MCHC: 32.8 g/dL (ref 30.0–36.0)
MCV: 91.1 fL (ref 80.0–100.0)
Platelets: 194 10*3/uL (ref 150–400)
RBC: 3.95 MIL/uL (ref 3.87–5.11)
RDW: 12.8 % (ref 11.5–15.5)
WBC: 8.1 10*3/uL (ref 4.0–10.5)
nRBC: 0 % (ref 0.0–0.2)

## 2019-10-27 LAB — COMPREHENSIVE METABOLIC PANEL
ALT: 12 U/L (ref 0–44)
AST: 18 U/L (ref 15–41)
Albumin: 3 g/dL — ABNORMAL LOW (ref 3.5–5.0)
Alkaline Phosphatase: 49 U/L (ref 38–126)
Anion gap: 14 (ref 5–15)
BUN: 6 mg/dL — ABNORMAL LOW (ref 8–23)
CO2: 20 mmol/L — ABNORMAL LOW (ref 22–32)
Calcium: 8.2 mg/dL — ABNORMAL LOW (ref 8.9–10.3)
Chloride: 99 mmol/L (ref 98–111)
Creatinine, Ser: 0.52 mg/dL (ref 0.44–1.00)
GFR calc Af Amer: >60 mL/min (ref >60)
GFR calc non Af Amer: >60 mL/min (ref >60)
Glucose, Bld: 153 mg/dL — ABNORMAL HIGH (ref 70–99)
Potassium: 3.4 mmol/L — ABNORMAL LOW (ref 3.5–5.1)
Sodium: 133 mmol/L — ABNORMAL LOW (ref 135–145)
Total Bilirubin: 0.4 mg/dL (ref 0.3–1.2)
Total Protein: 5.5 g/dL — ABNORMAL LOW (ref 6.5–8.1)

## 2019-10-27 LAB — URINALYSIS, ROUTINE W REFLEX MICROSCOPIC
Bilirubin Urine: NEGATIVE
Glucose, UA: NEGATIVE mg/dL
Hgb urine dipstick: NEGATIVE
Ketones, ur: 5 mg/dL — AB
Leukocytes,Ua: NEGATIVE
Nitrite: NEGATIVE
Protein, ur: NEGATIVE mg/dL
Specific Gravity, Urine: 1.008 (ref 1.005–1.030)
pH: 8 (ref 5.0–8.0)

## 2019-10-27 LAB — CBG MONITORING, ED: Glucose-Capillary: 149 mg/dL — ABNORMAL HIGH (ref 70–99)

## 2019-10-27 IMAGING — MR MR HEAD W/O CM
8 of 10 series · 38 of 48 positions shown · non-contrast
Comparison: Noncontrast head CT performed earlier the same day
[DATE], brain MRI [DATE]

CLINICAL DATA: Focal neuro deficit, greater than 6 hours, stroke
suspected. Altered mental status, unclear cause.

EXAM:
MRI HEAD WITHOUT CONTRAST
TECHNIQUE: Multiplanar, multiecho pulse sequences of the brain and surrounding
structures were obtained without intravenous contrast.

[Series 3: DWI · axial · 3.0mm · 1.09mm/px · z∈[-80,+73]mm · 9 of 104 slices shown (1 of 4)]
[im 1/104]
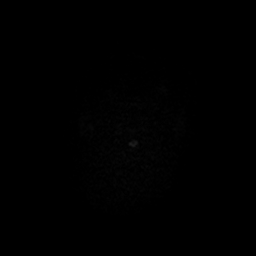
[im 18/104]
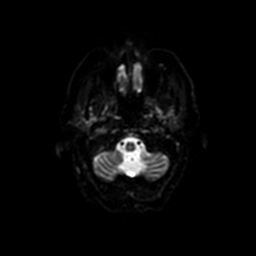
[im 35/104]
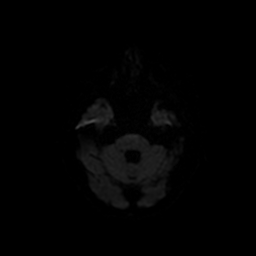
[im 43/104]
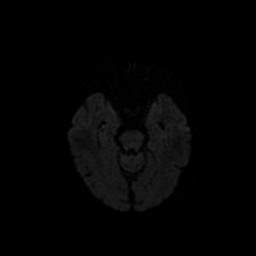
[im 52/104]
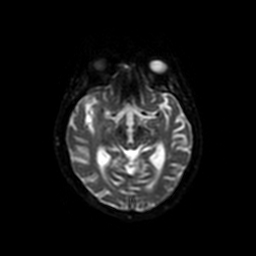
[im 61/104]
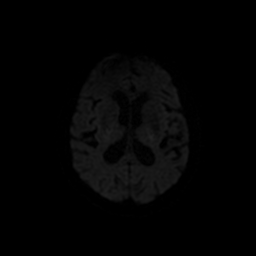
[im 69/104]
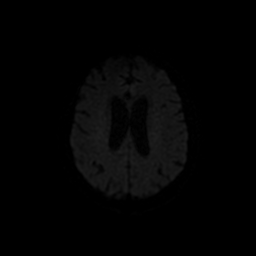
[im 86/104]
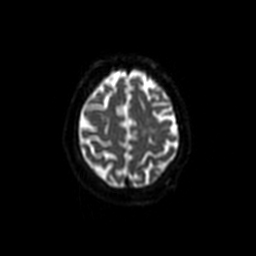
[im 104/104]
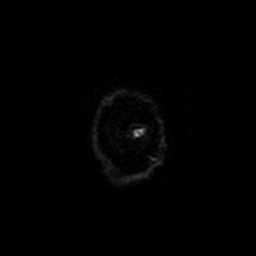

[Series 4: DWI · coronal · 5.0mm · 1.09mm/px · 9 of 76 slices shown (2 of 4)]
[im 1/76]
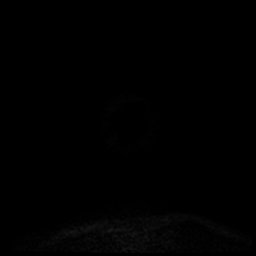
[im 10/76]
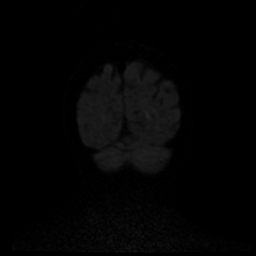
[im 19/76]
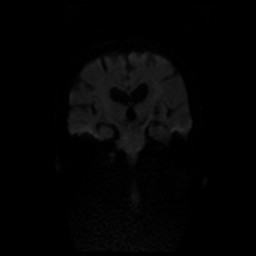
[im 29/76]
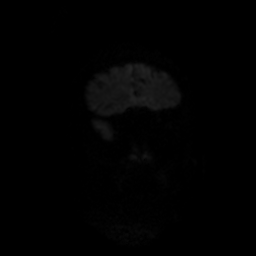
[im 38/76]
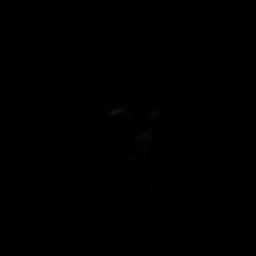
[im 47/76]
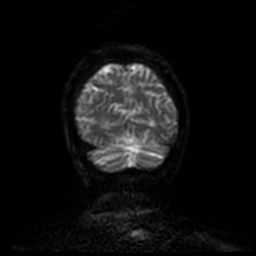
[im 57/76]
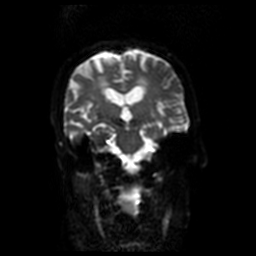
[im 66/76]
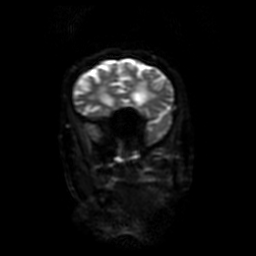
[im 76/76]
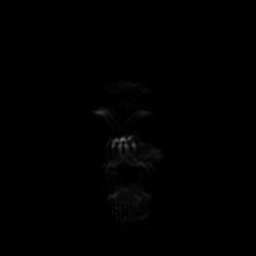

[Series 7: FLAIR · axial · 3.0mm · 0.43mm/px · z∈[-75,+62]mm · 3 of 24 slices shown (1 of 2)]
[im 1/24]
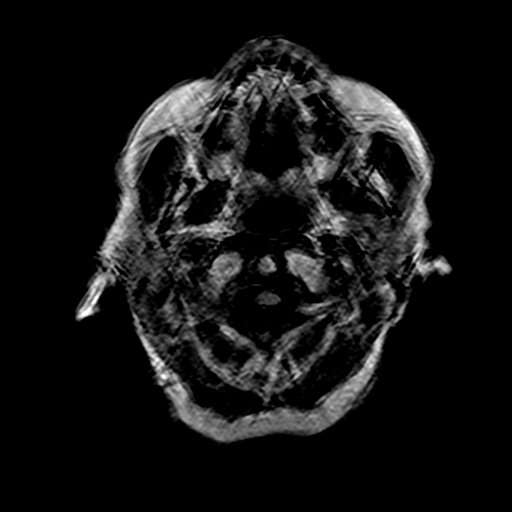
[im 12/24]
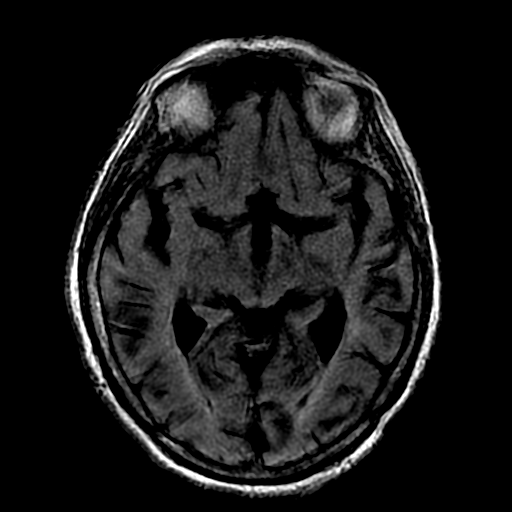
[im 24/24]
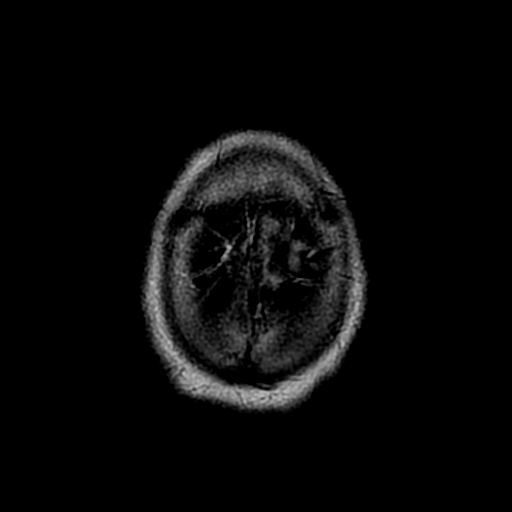

[Series 8: T2 · axial · 5.0mm · 0.43mm/px · z∈[-75,+62]mm · 3 of 24 slices shown (1 of 2)]
[im 1/24]
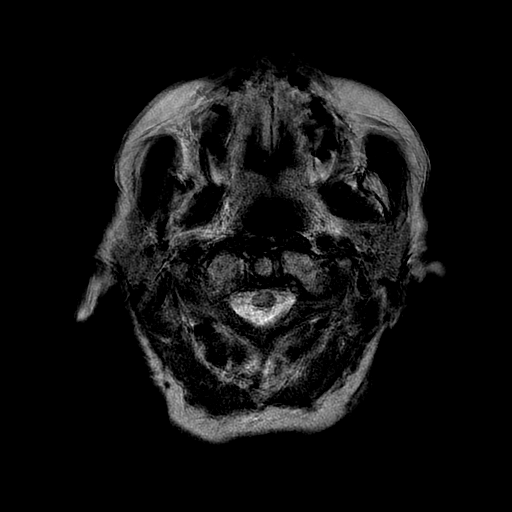
[im 12/24]
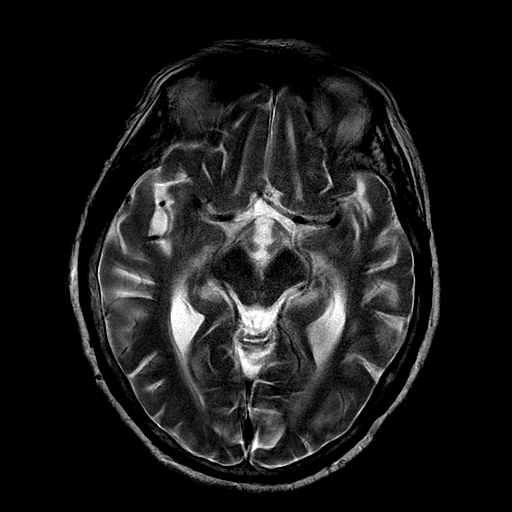
[im 24/24]
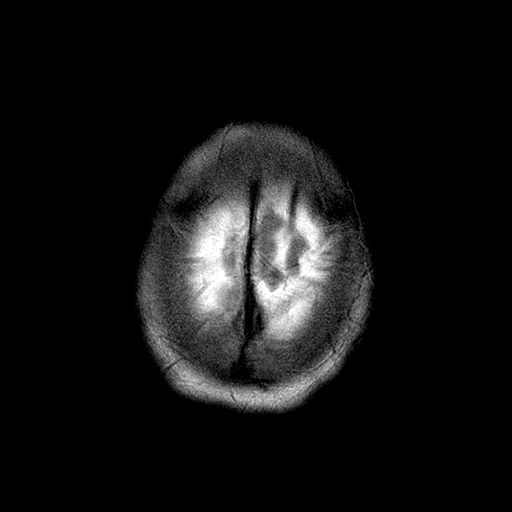

[Series 10: T2 · coronal · 5.0mm · 0.43mm/px · 1 of 24 slices shown (2 of 2)]
[im 1/24]
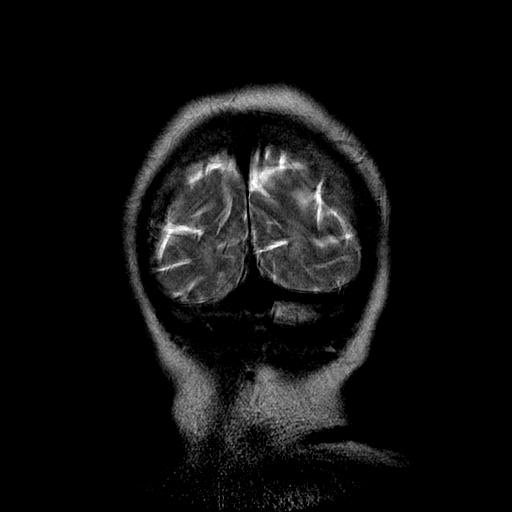

[Series 11: FLAIR · sagittal · 5.0mm · 0.47mm/px · 3 of 23 slices shown (2 of 2)]
[im 1/23]
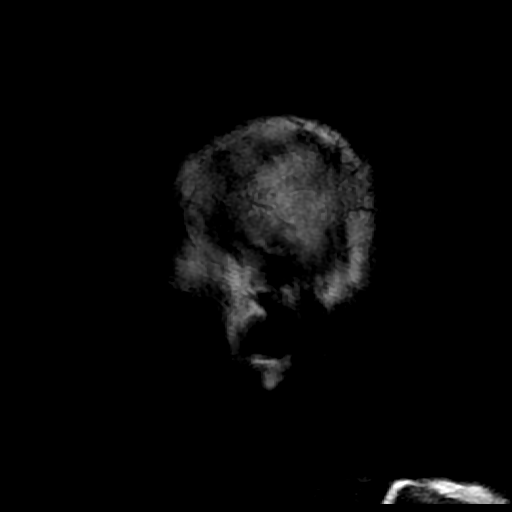
[im 12/23]
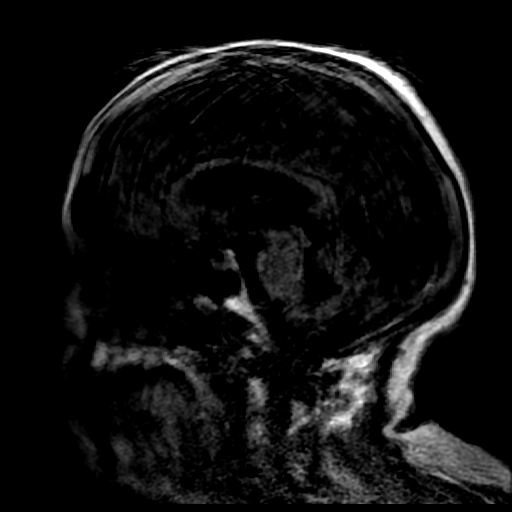
[im 23/23]
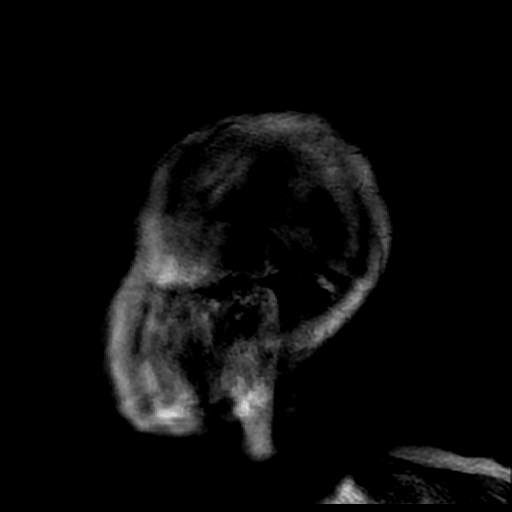

[Series 300: DWI · axial · 3.0mm · 1.09mm/px · z∈[-80,+73]mm · 6 of 52 slices shown (3 of 4)]
[im 1/52]
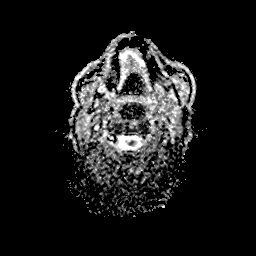
[im 11/52]
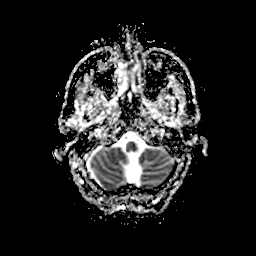
[im 21/52]
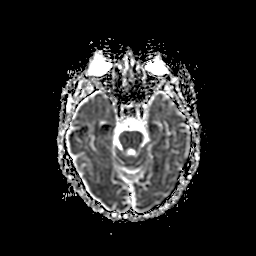
[im 31/52]
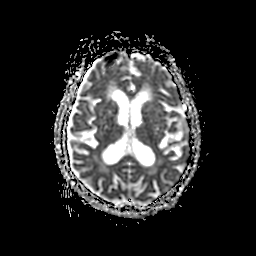
[im 41/52]
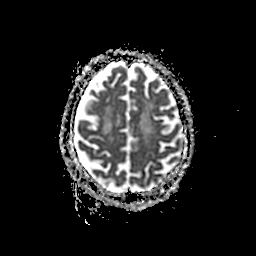
[im 52/52]
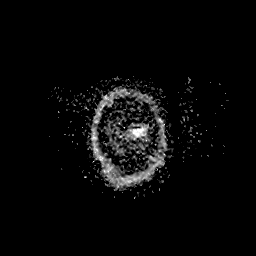

[Series 400: DWI · coronal · 5.0mm · 1.09mm/px · 4 of 38 slices shown (4 of 4)]
[im 1/38]
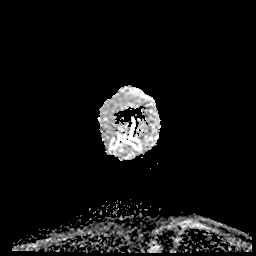
[im 13/38]
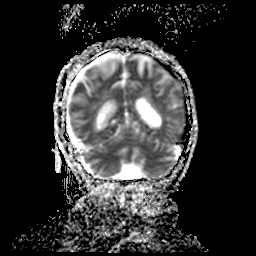
[im 25/38]
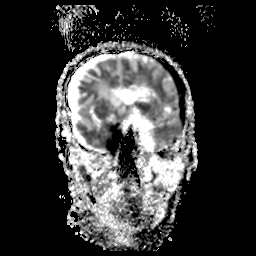
[im 38/38]
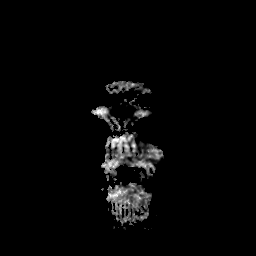

[38 of 48 positions shown; findings below may reference images not displayed]

FINDINGS: Brain:

Multiple sequences are significantly motion degraded, limiting
evaluation. Most notably there is moderate/severe motion degradation
of the axial T2* sequence, moderate/severe motion degradation of the
axial T1 weighted sequence, severe motion degradation of the axial
T2 FLAIR sequence, severe motion degradation of the axial T2
weighted sequence, severe motion degradation of the coronal T2
weighted sequence and severe motion degradation of the sagittal T1
weighted sequence.

There is an 8 mm focus of restricted diffusion consistent with acute
infarct within the medial left temporal occipital cortex (series 3,
image 20). No acute infarct is identified elsewhere within the
brain.

Redemonstrated moderate chronic small vessel ischemic changes within
the cerebral white matter. Stable appearance of less prominent
chronic small-vessel ischemic changes within the deep gray nuclei
and pons in the limitations of motion degradation.

No evidence of intracranial mass is identified.

No chronic intracranial blood products identified on motion degraded
imaging.

No extra-axial fluid collection is identified.

No midline shift.

Vascular: Expected proximal arterial flow voids.

Skull and upper cervical spine: No focal marrow lesion within the
limitations of significant motion degradation.

Sinuses/Orbits: Visualized orbits show no acute finding. No
significant paranasal sinus disease or mastoid effusion at the
imaged levels.
IMPRESSION: 1. Significantly motion degraded and limited examination as
described.
2. 8 mm acute cortical infarct within the medial left temporal
occipital lobes.
3. Redemonstrated moderate chronic small vessel ischemic changes
within the cerebral white matter. Less severe chronic ischemic
changes are also again demonstrated within the deep gray nuclei and
pons. These findings are stable as compared MRI [DATE] within
the limitations of significant motion degradation.

## 2019-10-27 IMAGING — CR DG CHEST 2V
2 series · 2 of 2 positions shown · non-contrast
Comparison: [DATE]

CLINICAL DATA: Altered mental status.  Hypertension.

EXAM:
CHEST - 2 VIEW

[chest lat]
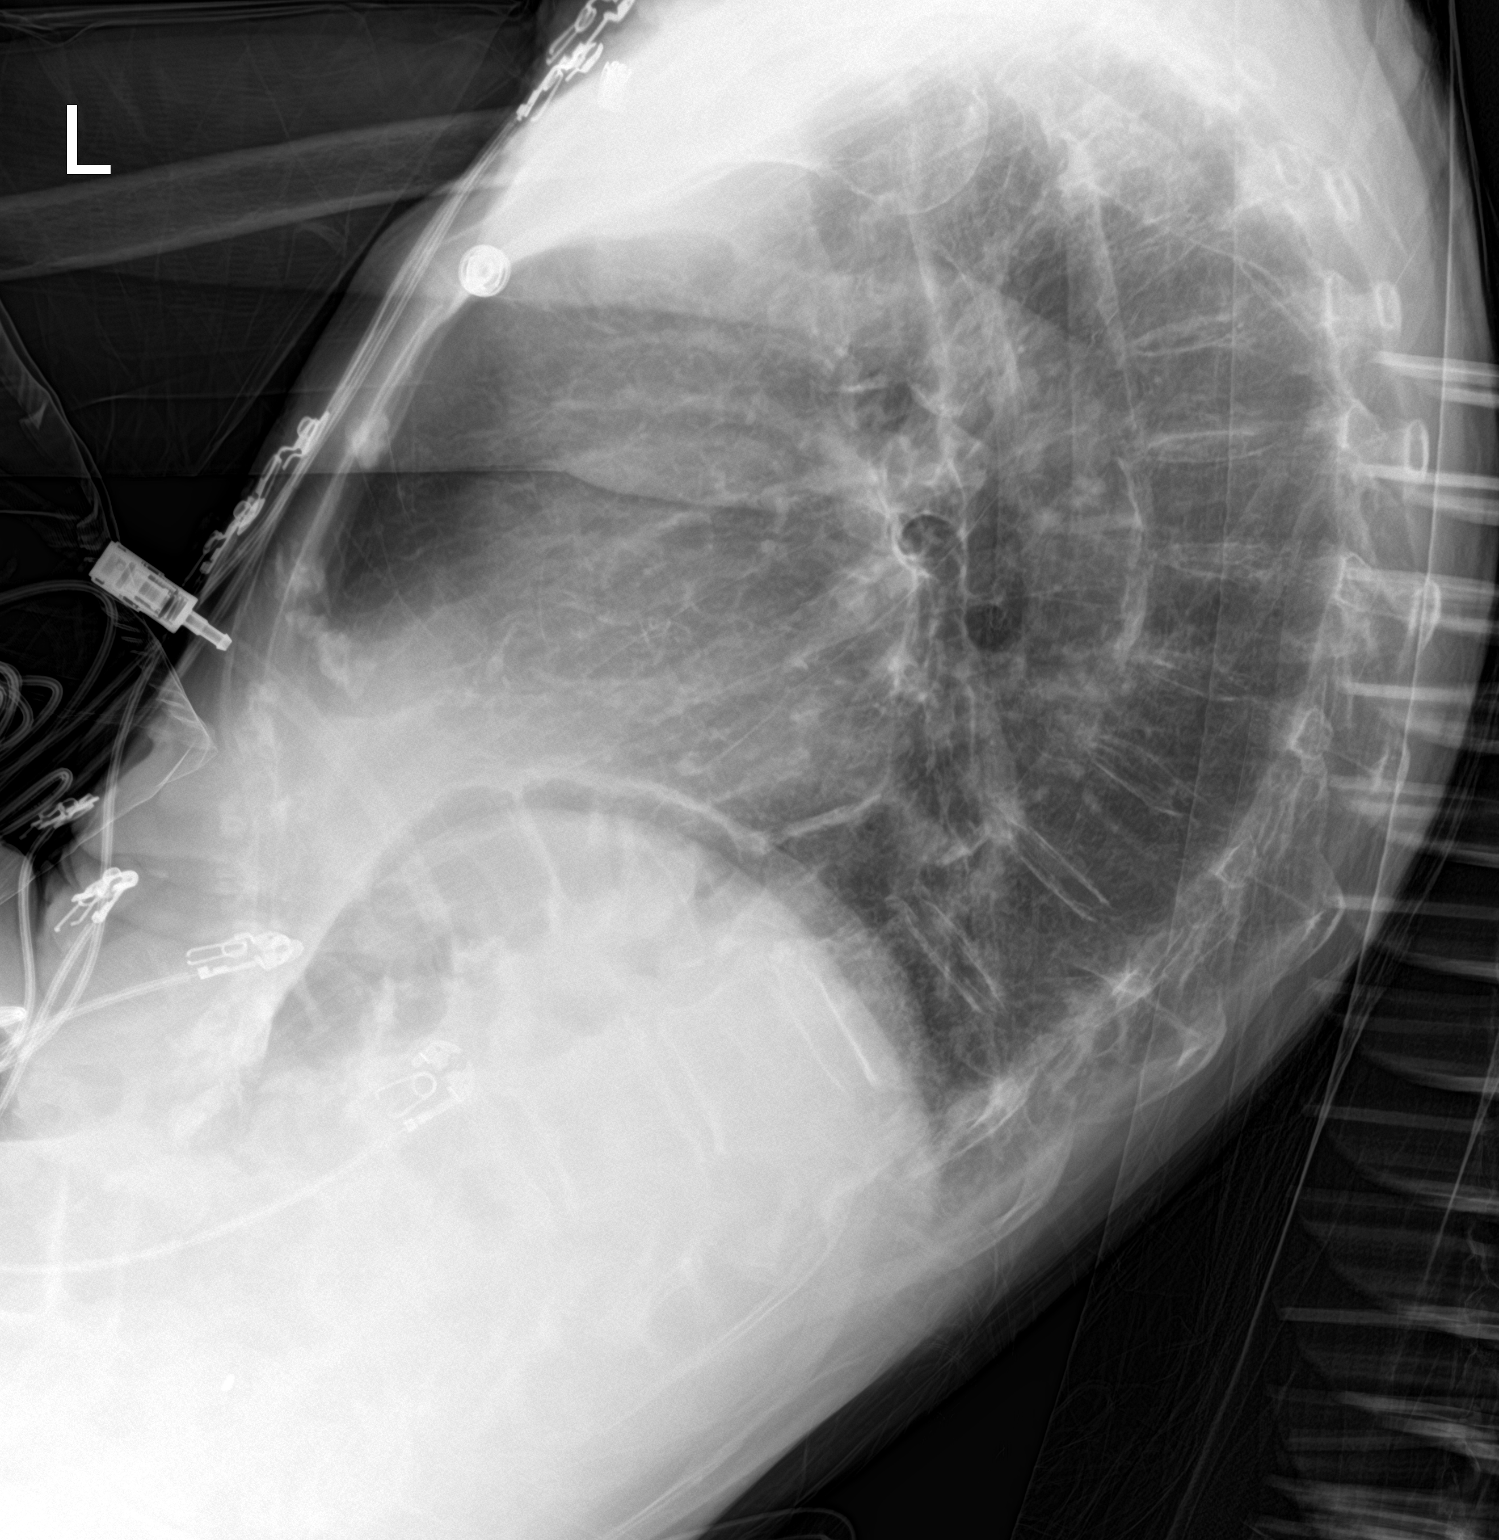

[chest ap]
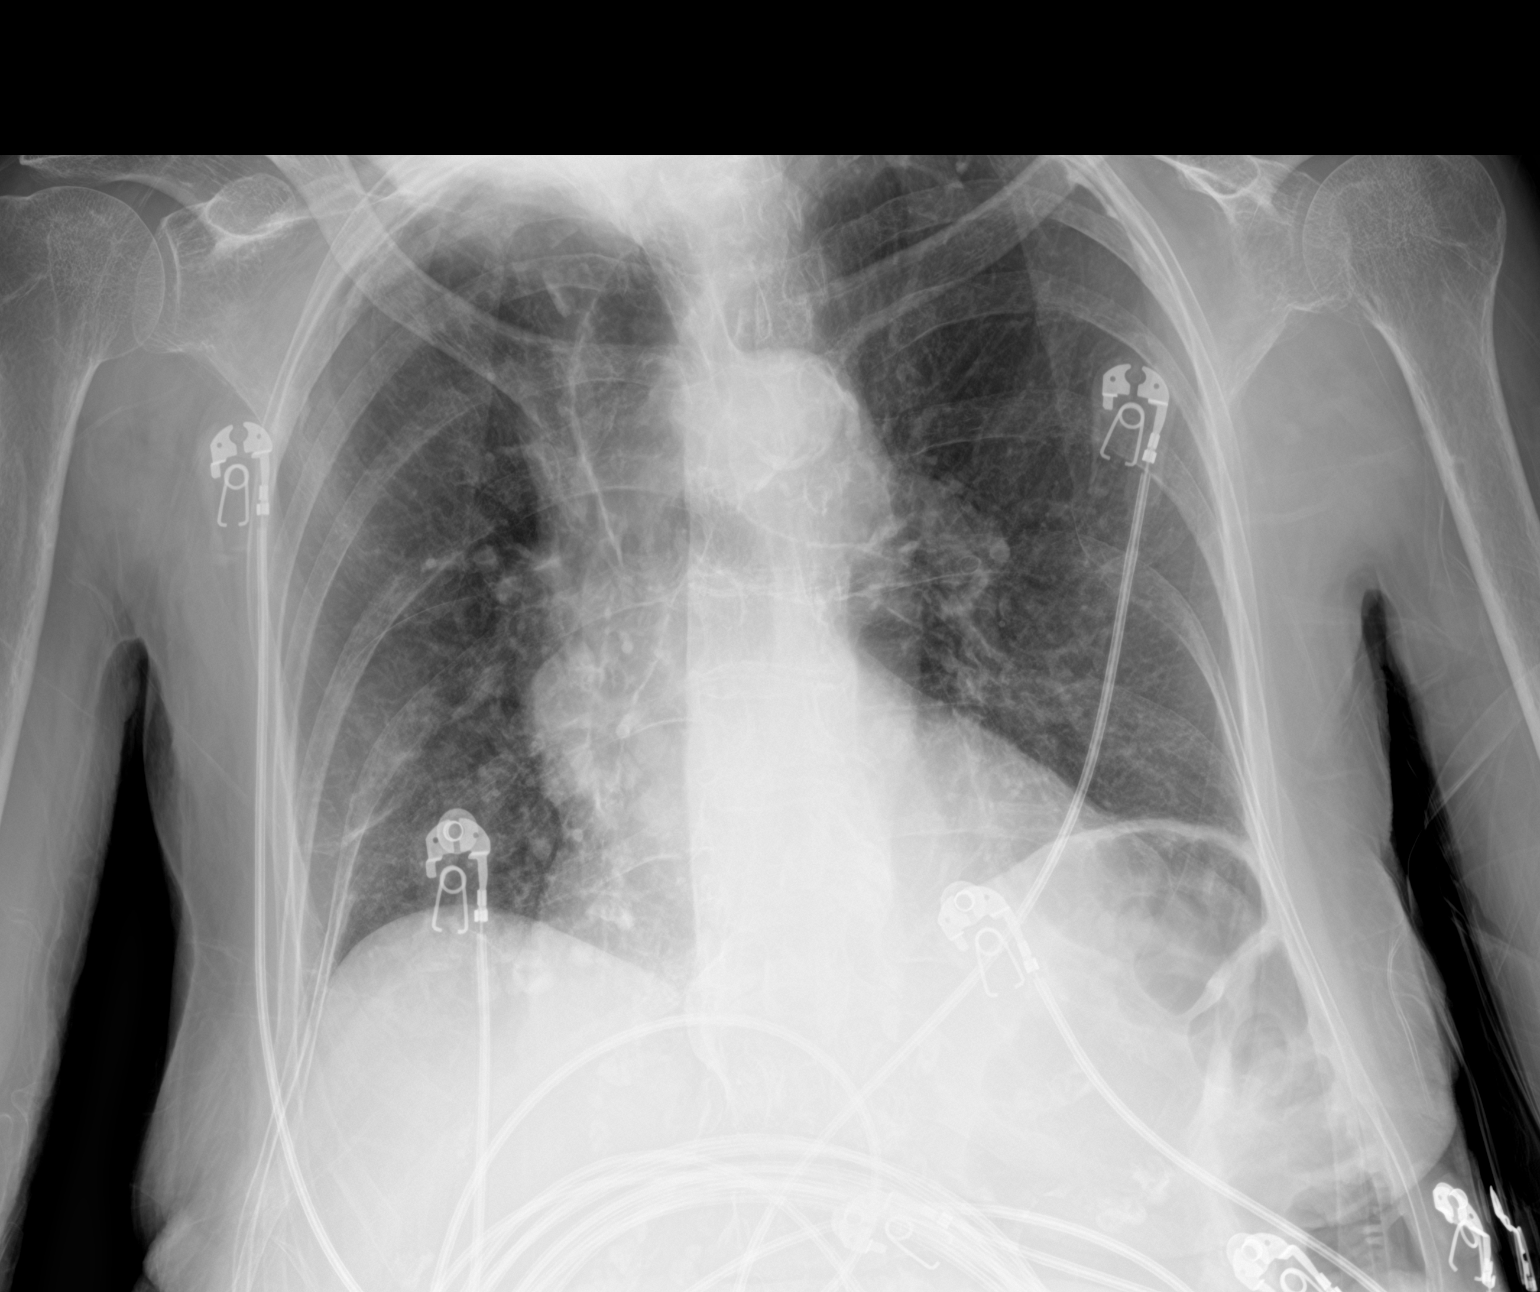

[2 of 2 positions shown; findings below may reference images not displayed]

FINDINGS: There is atelectatic change in the right base. The lungs elsewhere
are clear. Heart size and pulmonary vascularity are normal. No
adenopathy. There is aortic atherosclerosis. Bones are osteoporotic.
IMPRESSION: Atelectasis right base. Lungs elsewhere clear. Stable cardiac
silhouette. Aortic Atherosclerosis ([XE]-[XE]).

## 2019-10-27 IMAGING — CT CT ANGIO NECK
1 of 5 series · 10 of 33 positions shown · IV contrast (omnipaque)
Comparison: None.

CLINICAL DATA: Stroke follow-up

EXAM:
CT ANGIOGRAPHY HEAD AND NECK
TECHNIQUE: Multidetector CT imaging of the head and neck was performed using
the standard protocol during bolus administration of intravenous
contrast. Multiplanar CT image reconstructions and MIPs were
obtained to evaluate the vascular anatomy. Carotid stenosis
measurements (when applicable) are obtained utilizing NASCET
criteria, using the distal internal carotid diameter as the
denominator.
CONTRAST:  75mL OMNIPAQUE IOHEXOL 350 MG/ML SOLN

[Series 7: ax thins · axial · 0.39mm/px · z∈[-264,-14]mm · 10 of 306 slices shown]
[im 28/306  soft-tissue]
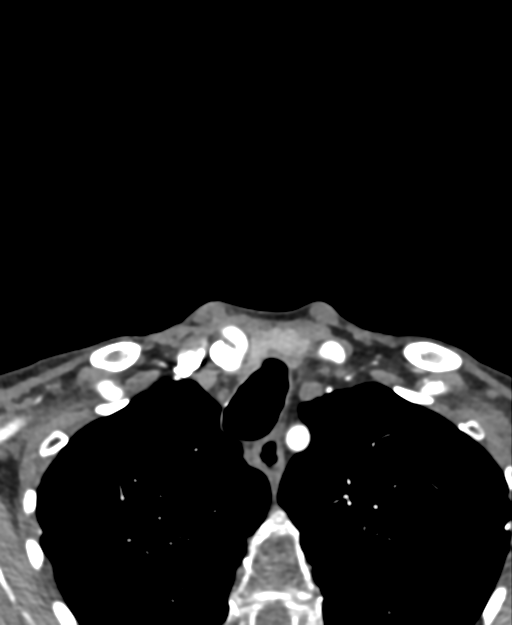
[im 56/306  bone]
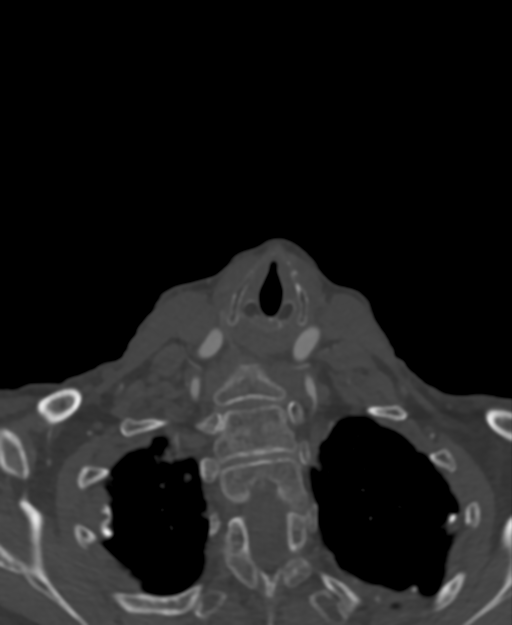
[im 84/306  soft-tissue]
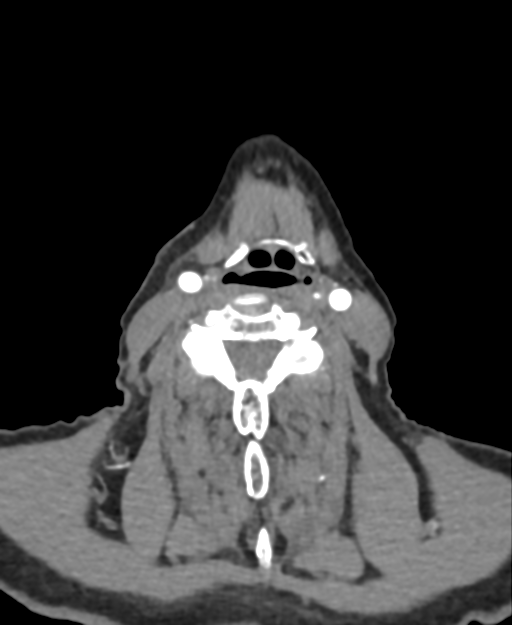
[im 111/306  bone]
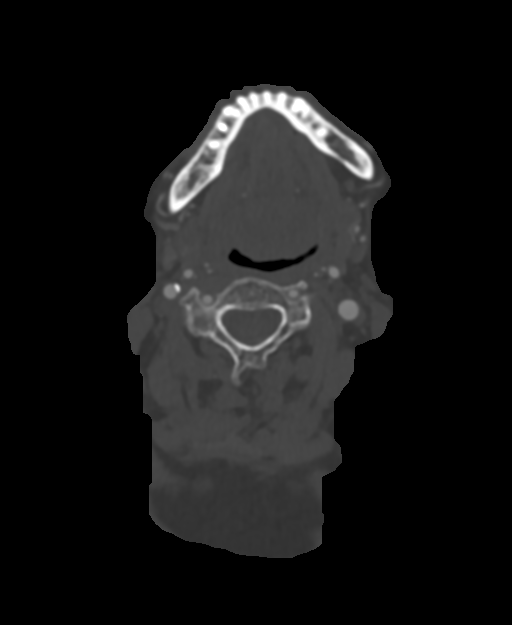
[im 139/306  soft-tissue]
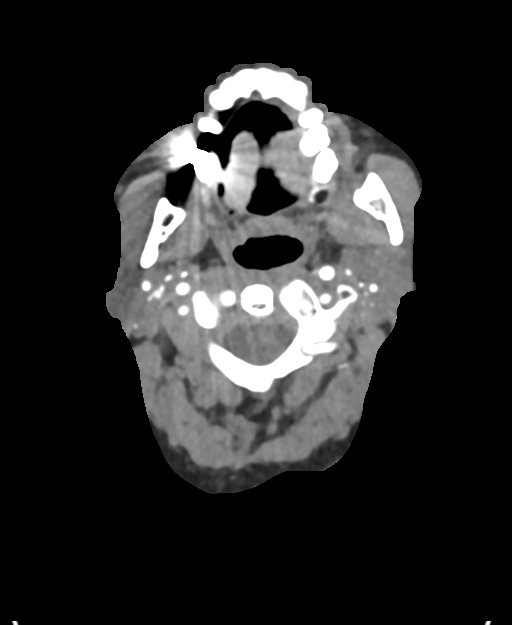
[im 167/306  bone]
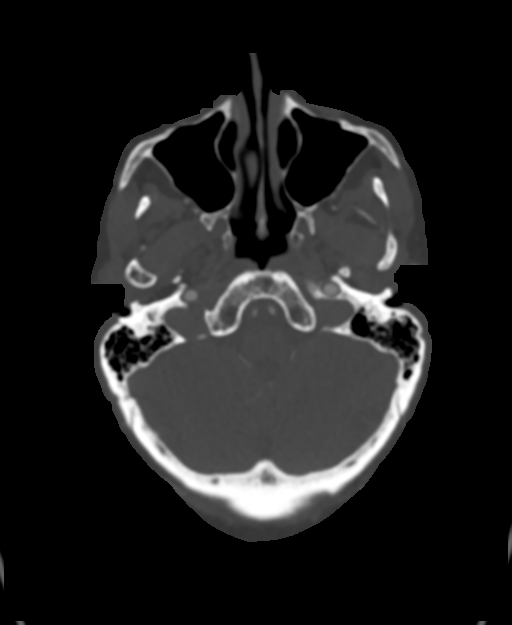
[im 195/306  soft-tissue]
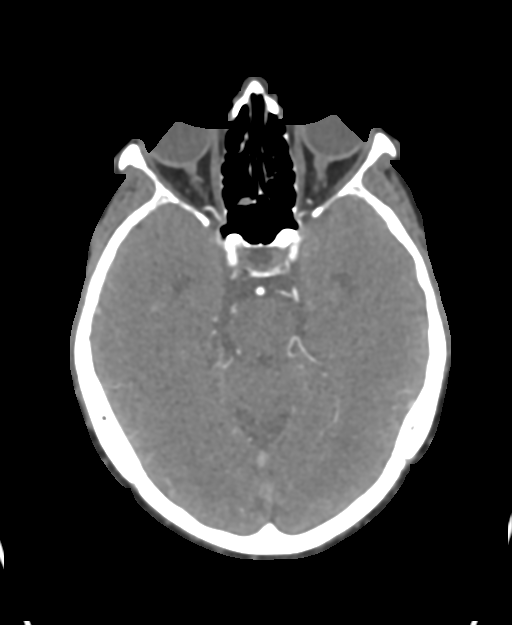
[im 222/306  bone]
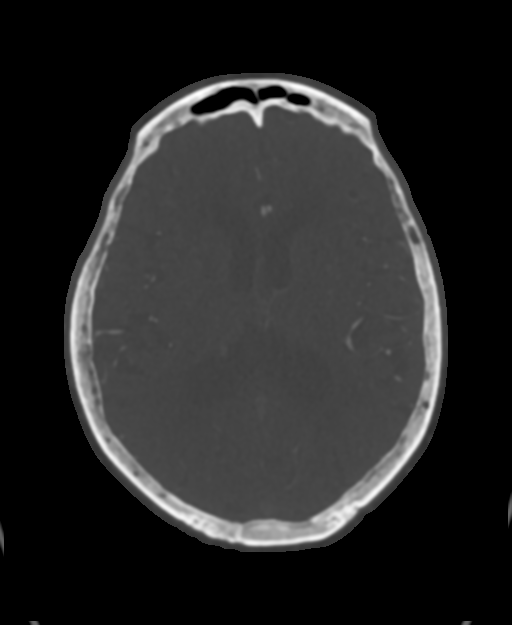
[im 250/306  soft-tissue]
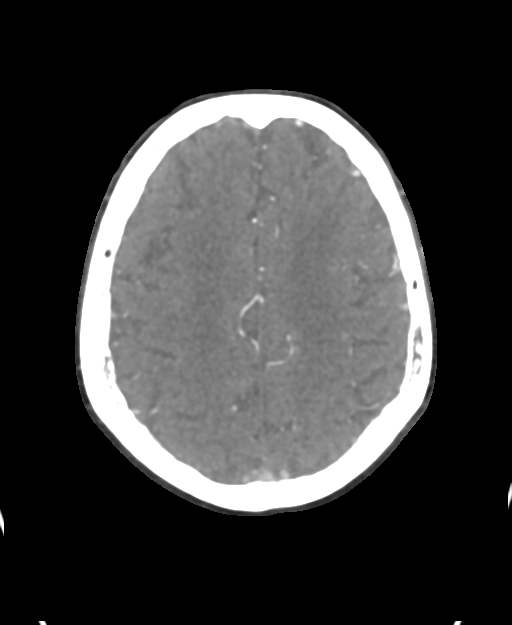
[im 278/306  bone]
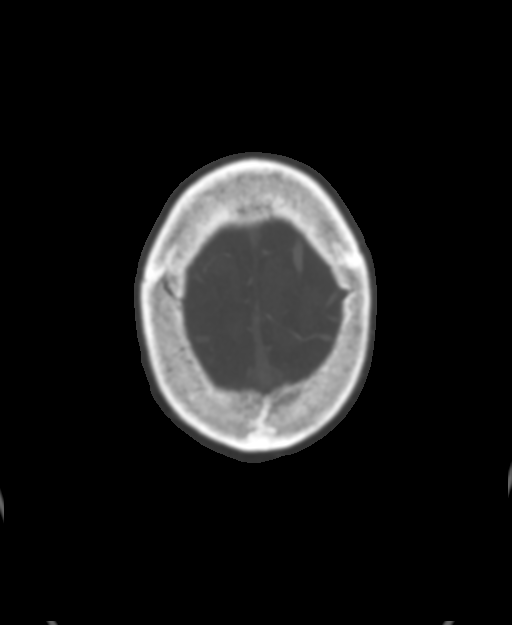

[10 of 33 positions shown; findings below may reference images not displayed]

FINDINGS: CTA NECK FINDINGS

SKELETON: There is no bony spinal canal stenosis. No lytic or
blastic lesion.

OTHER NECK: Normal pharynx, larynx and major salivary glands. No
cervical lymphadenopathy. Unremarkable thyroid gland.

UPPER CHEST: No pneumothorax or pleural effusion. No nodules or
masses.

AORTIC ARCH:

There is mild calcific atherosclerosis of the aortic arch. There is
no aneurysm, dissection or hemodynamically significant stenosis of
the visualized portion of the aorta. Conventional 3 vessel aortic
branching pattern. The visualized proximal subclavian arteries are
widely patent.

RIGHT CAROTID SYSTEM: No dissection, occlusion or aneurysm. Mild
atherosclerotic calcification at the carotid bifurcation without
hemodynamically significant stenosis.

LEFT CAROTID SYSTEM: Normal without aneurysm, dissection or
stenosis.

VERTEBRAL ARTERIES: Codominant configuration. Moderate
atherosclerosis of the left V1 segment. The right origin is normal
aside from mild atherosclerotic calcification.

CTA HEAD FINDINGS

POSTERIOR CIRCULATION:

--Vertebral arteries: Mild bilateral atherosclerotic calcification
without hemodynamically significant stenosis.

--Posterior inferior cerebellar arteries (PICA): Patent origins from
the vertebral arteries.

--Anterior inferior cerebellar arteries (AICA): Patent origins from
the basilar artery.

--Basilar artery: Moderate narrowing of the distal basilar artery.

--Superior cerebellar arteries: Normal.

--Posterior cerebral arteries: Normal. The right PCA is partially
supplied by a posterior communicating artery (p-comm).

ANTERIOR CIRCULATION:

--Intracranial internal carotid arteries: Atherosclerotic
calcification of the internal carotid arteries at the skull base
without hemodynamically significant stenosis.

--Anterior cerebral arteries (ACA): The right A1 segment is
congenitally absent. There is multifocal mild-to-moderate narrowing
of the A2 segments.

--Middle cerebral arteries (MCA): No occlusion. Multifocal
mild-to-moderate narrowing of both middle cerebral arteries,
left-greater-than-right.

VENOUS SINUSES: As permitted by contrast timing, patent.

ANATOMIC VARIANTS: None

Review of the MIP images confirms the above findings.
IMPRESSION: 1. No intracranial arterial occlusion or high-grade stenosis.
2. Moderate narrowing of the distal basilar artery.
3. Multifocal mild-to-moderate narrowing of the anterior and middle
cerebral arteries.
4. Moderate stenosis of the left vertebral artery V1 segment.
5. Aortic Atherosclerosis ([HJ]-[HJ]).

## 2019-10-27 IMAGING — CT CT HEAD W/O CM
4 series · 17 of 47 positions shown, 19 images · non-contrast
Comparison: [DATE]

CLINICAL DATA: Altered mental status. Last seen normal last night.
Found on the floor this morning.

EXAM:
CT HEAD WITHOUT CONTRAST
TECHNIQUE: Contiguous axial images were obtained from the base of the skull
through the vertex without intravenous contrast.

[Series 3: head without · axial · non-contrast · 0.39mm/px · z∈[-142,-22]mm · 7 of 34 slices shown, 9 images]
[im 5/34  brain]
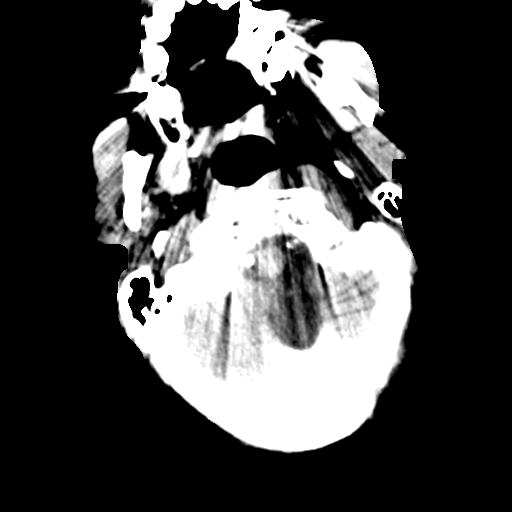
[im 5/34  bone]
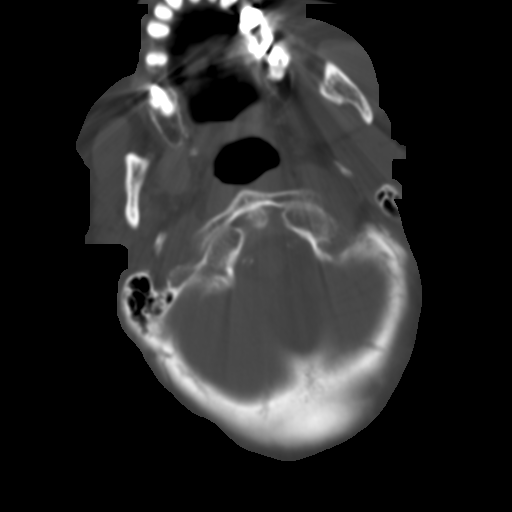
[im 9/34  brain]
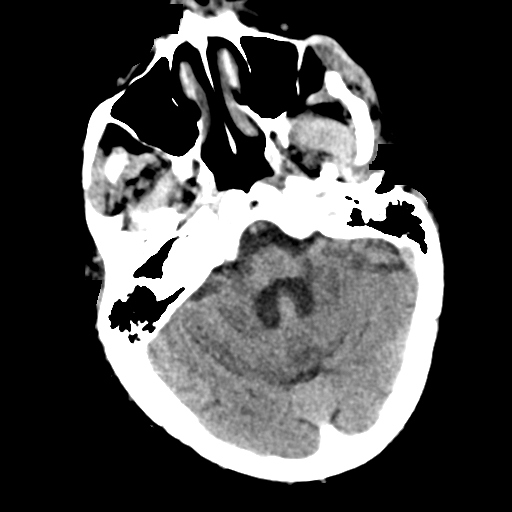
[im 13/34  brain]
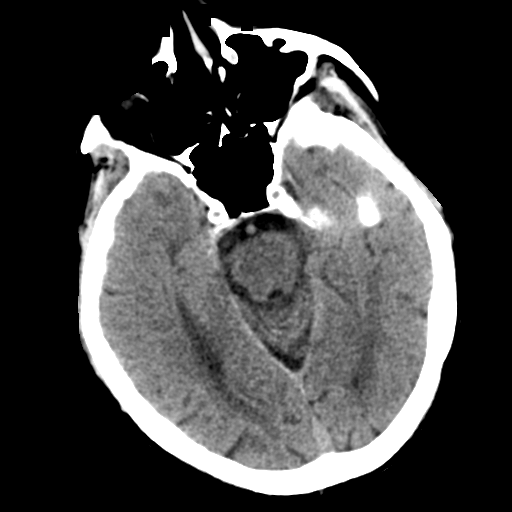
[im 17/34  brain]
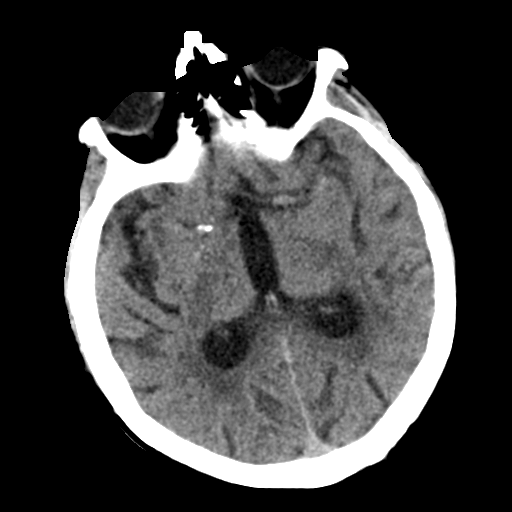
[im 21/34  brain]
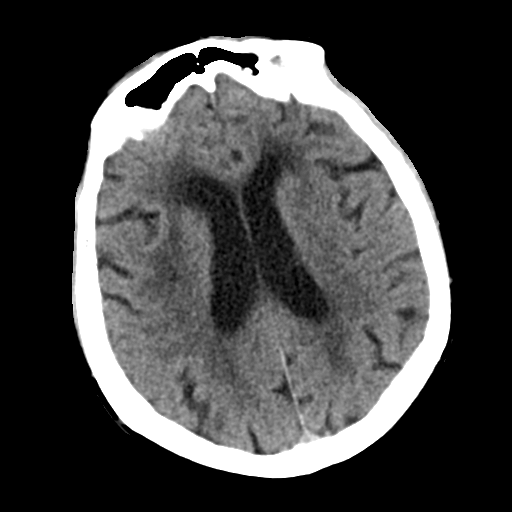
[im 21/34  bone]
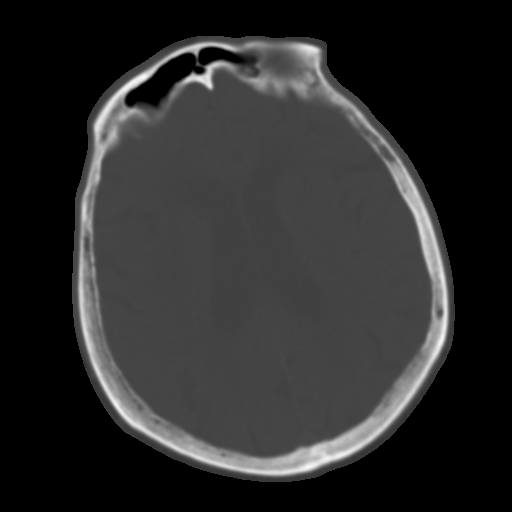
[im 25/34  brain]
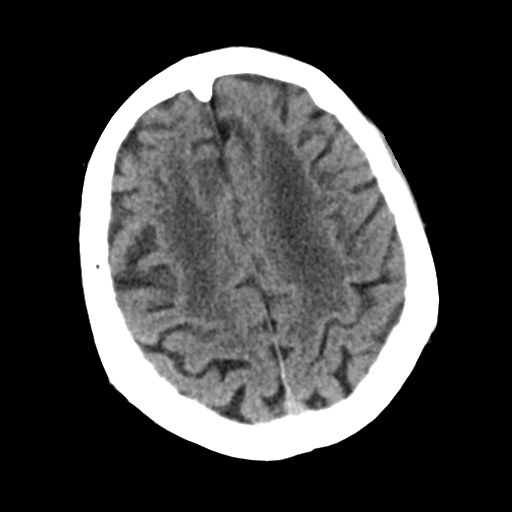
[im 29/34  brain]
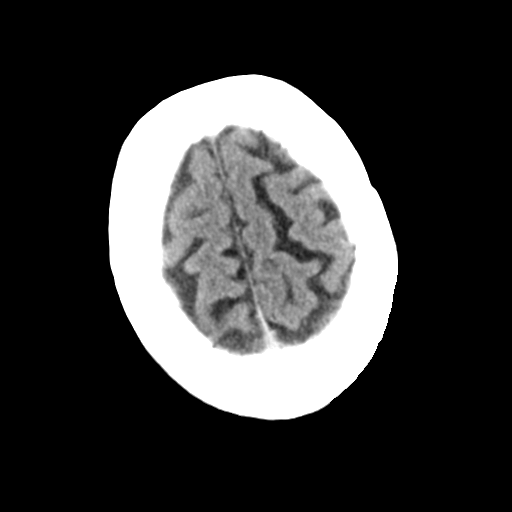

[Series 4: head bone · axial · 0.39mm/px · z∈[-146,-88]mm · 4 of 85 slices shown]
[im 9/85  bone]
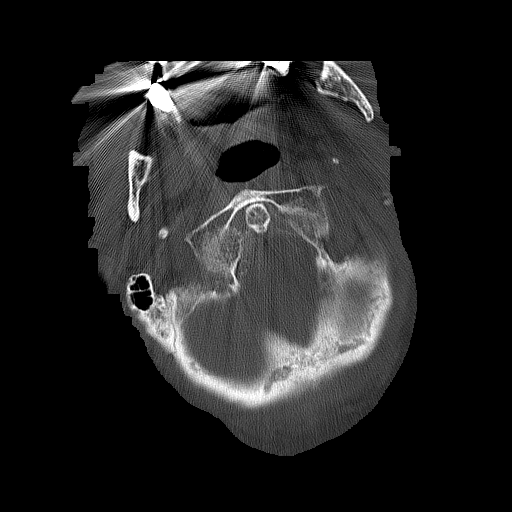
[im 17/85  bone]
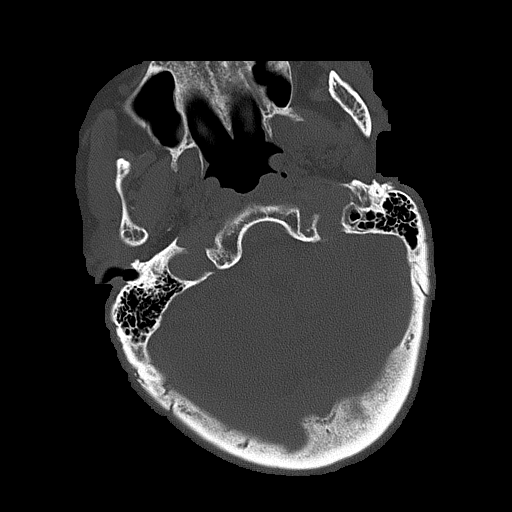
[im 26/85  bone]
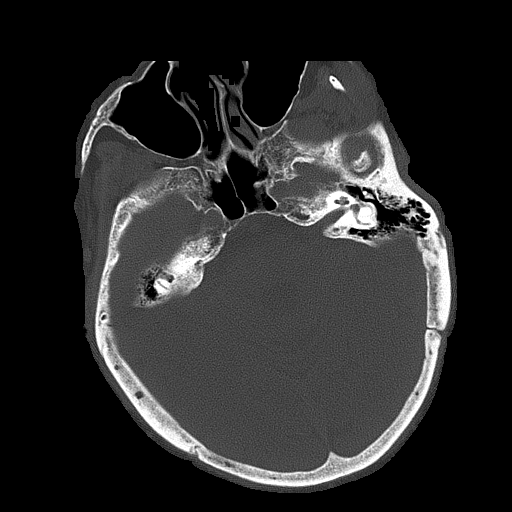
[im 38/85  bone]
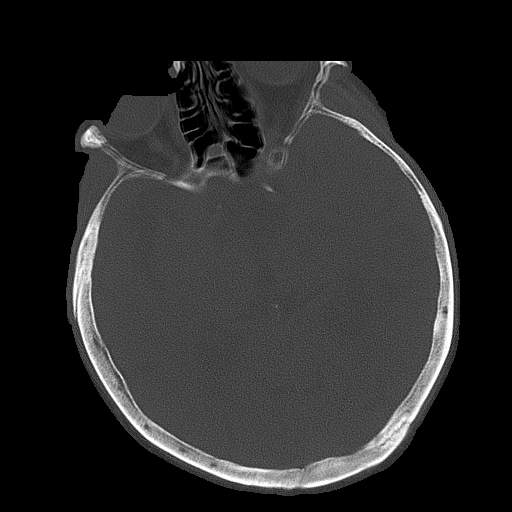

[Series 5: head without cor · coronal · non-contrast · 0.33mm/px · 3 of 67 slices shown]
[im 23/67  brain]
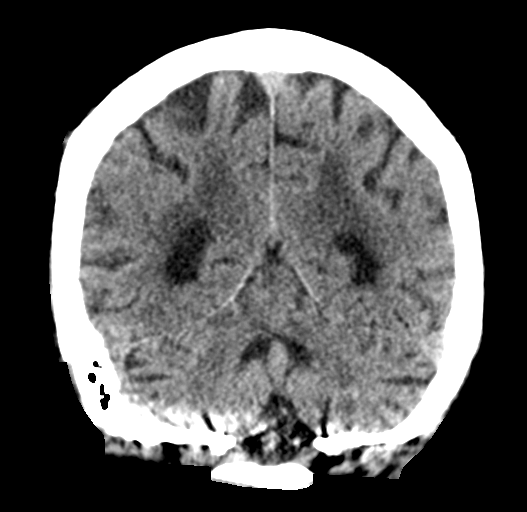
[im 30/67  brain]
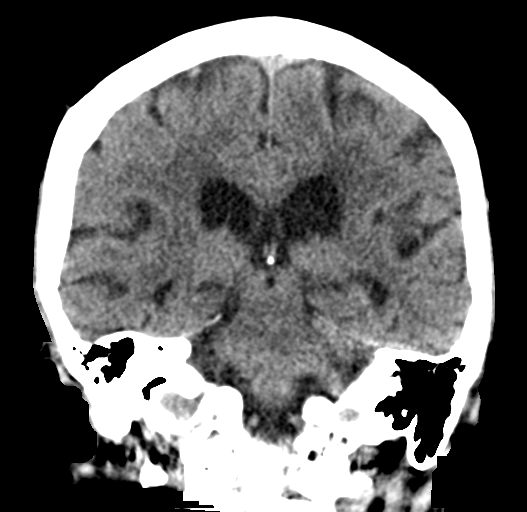
[im 37/67  brain]
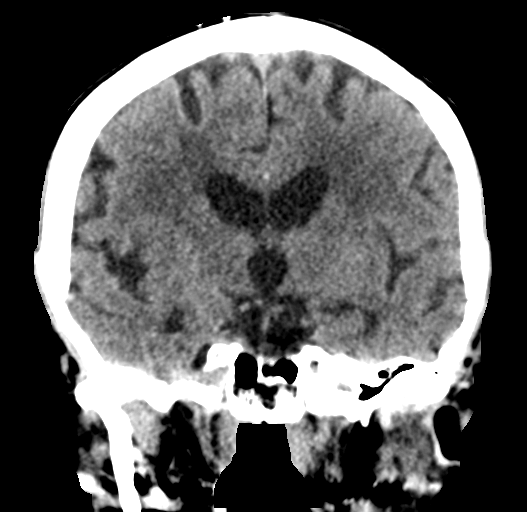

[Series 6: head without sag · sagittal · non-contrast · 0.33mm/px · 3 of 64 slices shown]
[im 22/64  brain]
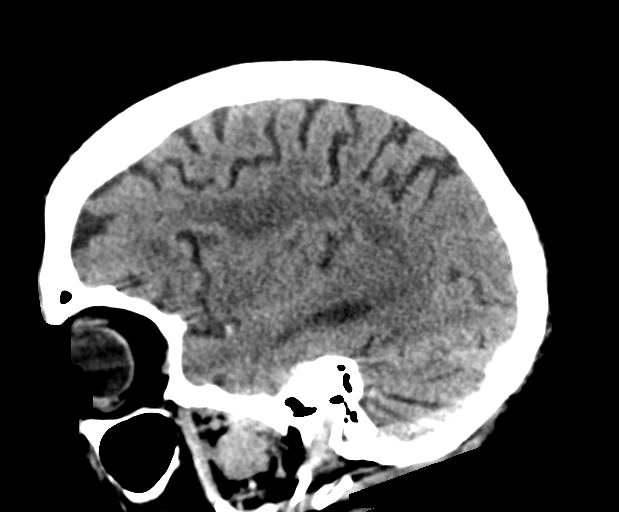
[im 32/64  brain]
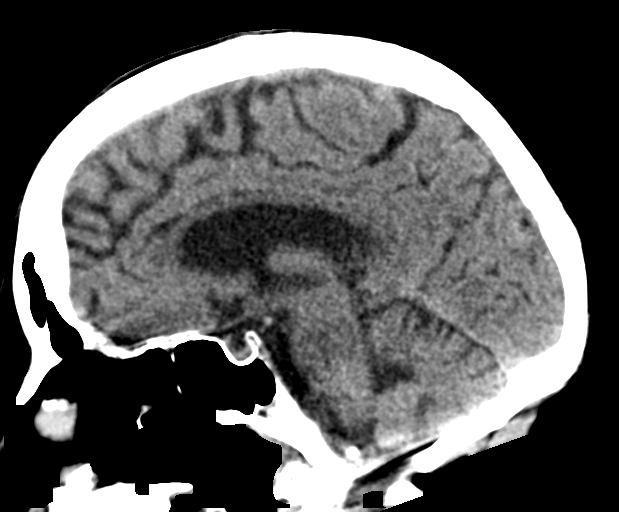
[im 43/64  brain]
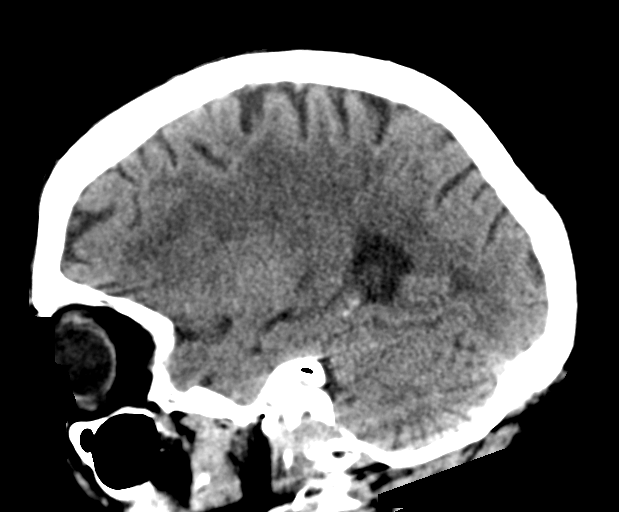

[17 of 47 positions shown; findings below may reference images not displayed]

FINDINGS: Brain: Age related atrophy. Chronic small-vessel ischemic changes of
the cerebral hemispheric white matter. No sign of acute infarction,
mass lesion, hemorrhage, hydrocephalus or extra-axial collection.

Vascular: There is atherosclerotic calcification of the major
vessels at the base of the brain.

Skull: Negative

Sinuses/Orbits: Clear/normal

Other: None
IMPRESSION: No acute finding by CT. Age related atrophy. Chronic small-vessel
ischemic changes of the cerebral hemispheric white matter.

## 2019-10-27 MED ORDER — VITAMIN D-3 125 MCG (5000 UT) PO TABS: ORAL_TABLET | Freq: Every day | ORAL | Status: DC

## 2019-10-27 MED ORDER — SODIUM CHLORIDE 0.9 % IV BOLUS
1000.0000 mL | Freq: Once | INTRAVENOUS | Status: AC
  Administered 2019-10-27: 1000 mL via INTRAVENOUS

## 2019-10-27 MED ORDER — POTASSIUM CHLORIDE CRYS ER 20 MEQ PO TBCR
20.0000 meq | EXTENDED_RELEASE_TABLET | Freq: Once | ORAL | Status: AC
  Administered 2019-10-27: 20 meq via ORAL
  Filled 2019-10-27: qty 1

## 2019-10-27 MED ORDER — ASPIRIN EC 81 MG PO TBEC
81.0000 mg | DELAYED_RELEASE_TABLET | Freq: Every day | ORAL | Status: DC
  Administered 2019-10-28 – 2019-10-29 (×2): 81 mg via ORAL
  Filled 2019-10-27 (×2): qty 1

## 2019-10-27 MED ORDER — LATANOPROST 0.005 % OP SOLN
1.0000 [drp] | Freq: Every day | OPHTHALMIC | Status: DC
  Administered 2019-10-27 – 2019-10-28 (×2): 1 [drp] via OPHTHALMIC
  Filled 2019-10-27: qty 2.5

## 2019-10-27 MED ORDER — PRESERVISION AREDS 2 PO CAPS: 1.0000 | ORAL_CAPSULE | Freq: Two times a day (BID) | ORAL | Status: DC

## 2019-10-27 MED ORDER — PROSIGHT PO TABS
1.0000 | ORAL_TABLET | Freq: Two times a day (BID) | ORAL | Status: DC
  Administered 2019-10-27 – 2019-10-29 (×4): 1 via ORAL
  Filled 2019-10-27 (×4): qty 1

## 2019-10-27 MED ORDER — IOHEXOL 350 MG/ML SOLN
75.0000 mL | Freq: Once | INTRAVENOUS | Status: AC | PRN
  Administered 2019-10-27: 75 mL via INTRAVENOUS

## 2019-10-27 MED ORDER — SODIUM CHLORIDE 0.9% FLUSH: 3.0000 mL | Freq: Once | INTRAVENOUS | Status: DC

## 2019-10-27 MED ORDER — ACETAMINOPHEN 650 MG RE SUPP: 650.0000 mg | RECTAL | Status: DC | PRN

## 2019-10-27 MED ORDER — DOCUSATE SODIUM 100 MG PO CAPS
200.0000 mg | ORAL_CAPSULE | Freq: Every evening | ORAL | Status: DC
  Administered 2019-10-27 – 2019-10-28 (×2): 200 mg via ORAL
  Filled 2019-10-27 (×2): qty 2

## 2019-10-27 MED ORDER — VITAMIN D 25 MCG (1000 UNIT) PO TABS
1000.0000 [IU] | ORAL_TABLET | Freq: Every day | ORAL | Status: DC
  Administered 2019-10-28: 1000 [IU] via ORAL
  Filled 2019-10-27: qty 1

## 2019-10-27 MED ORDER — ACETAMINOPHEN 325 MG PO TABS: 650.0000 mg | ORAL_TABLET | ORAL | Status: DC | PRN

## 2019-10-27 MED ORDER — VITAMIN B-12 100 MCG PO TABS
500.0000 ug | ORAL_TABLET | Freq: Every day | ORAL | Status: DC
  Administered 2019-10-27 – 2019-10-29 (×3): 500 ug via ORAL
  Filled 2019-10-27 (×3): qty 5

## 2019-10-27 MED ORDER — SODIUM CHLORIDE 0.9 % IV SOLN: INTRAVENOUS | Status: AC

## 2019-10-27 MED ORDER — ACETAMINOPHEN 160 MG/5ML PO SOLN: 650.0000 mg | ORAL | Status: DC | PRN

## 2019-10-27 MED ORDER — HYDRALAZINE HCL 25 MG PO TABS: 25.0000 mg | ORAL_TABLET | Freq: Four times a day (QID) | ORAL | Status: DC | PRN

## 2019-10-27 MED ORDER — STROKE: EARLY STAGES OF RECOVERY BOOK
Freq: Once | Status: AC
  Filled 2019-10-27: qty 1

## 2019-10-27 MED ORDER — ENOXAPARIN SODIUM 40 MG/0.4ML ~~LOC~~ SOLN
40.0000 mg | SUBCUTANEOUS | Status: DC
  Administered 2019-10-27 – 2019-10-28 (×2): 40 mg via SUBCUTANEOUS
  Filled 2019-10-27 (×2): qty 0.4

## 2019-10-27 MED ORDER — ATORVASTATIN CALCIUM 40 MG PO TABS
40.0000 mg | ORAL_TABLET | Freq: Every day | ORAL | Status: DC
  Administered 2019-10-28: 40 mg via ORAL
  Filled 2019-10-27 (×2): qty 1

## 2019-10-27 NOTE — ED Triage Notes (Signed)
Pt here via [facility] for altered mental status.  LSN 0930 pm last night.  Per family, pt is normally ao x 4.  They heard a fall around 0630 am and found her on the floor.  Pt is only able to not yes.  Follows some commands. States her name is Suzanne Cantu, but that is the only thing she says.    Per ems vs:  cbg 186 bp 173/75 t 99

## 2019-10-27 NOTE — H&P (Signed)
History and Physical    Suzanne Cantu PTW:[id number] DOB: 06-30-1927 DOA: 10/27/2019  PCP: Corky Downs, MD   Patient coming from: Home  I have personally briefly reviewed patient's old medical records in [facility]  Chief Complaint: I fell  HPI: Suzanne Cantu is a 86 y.o. female with medical history significant of essential hypertension diabetes mellitus type 2, chronic pain, paroxysmal A. Fib (which only happen once in August 06, 2019 after taking Depakote, Depakote was discontinued afterwards).  Patient lives with her son, and this morning, the son heard a loud thump went to see the patient, and found patient was lying down on the bathroom floor, conscious but confused.  And resulted on patient looks like at new onset right-sided weakness, and the patient is right-sided dominant.  Patient denies any chest pain, and claims that she does not feel any weakness or numbness of any of the limbs.  No blurred vision or hearing changes. ED Course: MRI showing new acute left-sided temporal occipital infarction.  Review of Systems: As per HPI otherwise 10 point review of systems negative.    Past Medical History:  Diagnosis Date  . Diabetes mellitus without complication (HCC)   . Hypertension     Past Surgical History:  Procedure Laterality Date  . ABDOMINAL HYSTERECTOMY    . CESAREAN SECTION     4  . CHOLECYSTECTOMY    . KIDNEY STONE SURGERY       reports that she has never smoked. She has never used smokeless tobacco. She reports that she does not drink alcohol. No history on file for drug.  Allergies  Allergen Reactions  . Valproic Acid And Related Other (See Comments)    Low blood pressure, weakness.  Remus Blake Other (See Comments)    "It made me not feel right"  . Peanut-Containing Drug Products Itching    No family history on file.   Prior to Admission medications   Medication Sig Start Date End Date Taking? Authorizing Provider  aspirin EC 81 MG  tablet Take 81 mg by mouth daily.    [provider]  bimatoprost (LUMIGAN) 0.01 % SOLN Place 1 drop into both eyes at bedtime.    [provider]  Cholecalciferol (VITAMIN D-3 PO) Take 1 capsule by mouth daily after supper.    [provider]  ibuprofen (ADVIL) 200 MG tablet Take 200 mg by mouth every 6 (six) hours as needed for headache, mild pain or moderate pain.     [provider]  metFORMIN (GLUCOPHAGE) 500 MG tablet Take 500 mg by mouth 2 (two) times daily with a meal.     [provider]  Multiple Vitamins-Minerals (PRESERVISION AREDS 2) CAPS Take 1 capsule by mouth 2 (two) times daily.    [provider]  valproic acid (DEPAKENE) 250 MG capsule Take 2 capsules (500 mg total) by mouth 2 (two) times daily. Patient not taking: Reported on 08/06/2019 08/02/19   Myrtie Neither, MD  valsartan-hydrochlorothiazide (DIOVAN-HCT) 80-12.5 MG tablet Take 1 tablet by mouth daily. 08/02/19   Myrtie Neither, MD  vitamin B-12 (CYANOCOBALAMIN) 500 MCG tablet Take 1 tablet (500 mcg total) by mouth daily. 08/02/19   Myrtie Neither, MD    Physical Exam: Vitals:   10/27/19 1105 10/27/19 1115 10/27/19 1500  BP: (!) 178/89    Pulse: 92    Resp: 15    Temp: 99.3 F (37.4 C) 99.3 F (37.4 C) 99.7 F (37.6 C)  TempSrc: Rectal Rectal Oral  SpO2: 98%    Weight:  53.8 kg   Height:  5' (1.524 m)     Constitutional: NAD, calm, comfortable Vitals:   10/27/19 1105 10/27/19 1115 10/27/19 1500  BP: (!) 178/89    Pulse: 92    Resp: 15    Temp: 99.3 F (37.4 C) 99.3 F (37.4 C) 99.7 F (37.6 C)  TempSrc: Rectal Rectal Oral  SpO2: 98%    Weight:  53.8 kg   Height:  5' (1.524 m)    Eyes: PERRL, lids and conjunctivae normal ENMT: Mucous membranes are moist. Posterior pharynx clear of any exudate or lesions.Normal dentition.  Neck: normal, supple, no masses, no thyromegaly Respiratory: clear to auscultation bilaterally, no wheezing, no crackles.  Normal respiratory effort. No accessory muscle use.  Cardiovascular: Regular rate and rhythm, no murmurs / rubs / gallops. No extremity edema. 2+ pedal pulses. No carotid bruits.  Abdomen: no tenderness, no masses palpated. No hepatosplenomegaly. Bowel sounds positive.  Musculoskeletal: no clubbing / cyanosis. No joint deformity upper and lower extremities. Good ROM, no contractures. Normal muscle tone.  Skin: no rashes, lesions, ulcers. No induration Neurologic: Following commands, strength 5/5 in all 4.  Psychiatric: Somewhat confused, oriented to herself confused about time and place but following commands.     Labs on Admission: I have personally reviewed following labs and imaging studies  CBC: Recent Labs  Lab 10/27/19 1138  WBC 8.1  HGB 11.8*  HCT 36.0  MCV 91.1  PLT 194   Basic Metabolic Panel: Recent Labs  Lab 10/27/19 1138  NA 133*  K 3.4*  CL 99  CO2 20*  GLUCOSE 153*  BUN 6*  CREATININE 0.52  CALCIUM 8.2*   GFR: Estimated Creatinine Clearance: 33.6 mL/min (by C-G formula based on SCr of 0.52 mg/dL). Liver Function Tests: Recent Labs  Lab 10/27/19 1138  AST 18  ALT 12  ALKPHOS 49  BILITOT 0.4  PROT 5.5*  ALBUMIN 3.0*   No results for input(s): LIPASE, AMYLASE in the last 168 hours. No results for input(s): AMMONIA in the last 168 hours. Coagulation Profile: No results for input(s): INR, PROTIME in the last 168 hours. Cardiac Enzymes: No results for input(s): CKTOTAL, CKMB, CKMBINDEX, TROPONINI in the last 168 hours. BNP (last 3 results) No results for input(s): PROBNP in the last 8760 hours. HbA1C: No results for input(s): HGBA1C in the last 72 hours. CBG: Recent Labs  Lab 10/27/19 1107  GLUCAP 149*   Lipid Profile: No results for input(s): CHOL, HDL, LDLCALC, TRIG, CHOLHDL, LDLDIRECT in the last 72 hours. Thyroid Function Tests: No results for input(s): TSH, T4TOTAL, FREET4, T3FREE, THYROIDAB in the last 72 hours. Anemia Panel: No  results for input(s): VITAMINB12, FOLATE, FERRITIN, TIBC, IRON, RETICCTPCT in the last 72 hours. Urine analysis:    Component Value Date/Time   COLORURINE YELLOW 10/27/2019 1501   APPEARANCEUR CLOUDY (A) 10/27/2019 1501   LABSPEC 1.008 10/27/2019 1501   PHURINE 8.0 10/27/2019 1501   GLUCOSEU NEGATIVE 10/27/2019 1501   HGBUR NEGATIVE 10/27/2019 1501   BILIRUBINUR NEGATIVE 10/27/2019 1501   KETONESUR 5 (A) 10/27/2019 1501   PROTEINUR NEGATIVE 10/27/2019 1501   NITRITE NEGATIVE 10/27/2019 1501   LEUKOCYTESUR NEGATIVE 10/27/2019 1501    Radiological Exams on Admission: DG Chest 2 View  Result Date: 10/27/2019 CLINICAL DATA:  Altered mental status.  Hypertension. EXAM: CHEST - 2 VIEW COMPARISON:  August 06, 2019 FINDINGS: There is atelectatic change in the right base. The lungs elsewhere are clear. Heart size  and pulmonary vascularity are normal. No adenopathy. There is aortic atherosclerosis. Bones are osteoporotic. IMPRESSION: Atelectasis right base. Lungs elsewhere clear. Stable cardiac silhouette. Aortic Atherosclerosis (ICD10-I70.0). Electronically Signed   By: Bretta Bang III M.D.   On: 10/27/2019 14:02   CT Head Wo Contrast  Result Date: 10/27/2019 CLINICAL DATA:  Altered mental status. Last seen normal last night. Found on the floor this morning. EXAM: CT HEAD WITHOUT CONTRAST TECHNIQUE: Contiguous axial images were obtained from the base of the skull through the vertex without intravenous contrast. COMPARISON:  07/31/2019 FINDINGS: Brain: Age related atrophy. Chronic small-vessel ischemic changes of the cerebral hemispheric white matter. No sign of acute infarction, mass lesion, hemorrhage, hydrocephalus or extra-axial collection. Vascular: There is atherosclerotic calcification of the major vessels at the base of the brain. Skull: Negative Sinuses/Orbits: Clear/normal Other: None IMPRESSION: No acute finding by CT. Age related atrophy. Chronic small-vessel ischemic changes of the  cerebral hemispheric white matter. Electronically Signed   By: Paulina Fusi M.D.   On: 10/27/2019 13:26   MR Brain Wo Contrast (neuro protocol)  Result Date: 10/27/2019 CLINICAL DATA:  Focal neuro deficit, greater than 6 hours, stroke suspected. Altered mental status, unclear cause. EXAM: MRI HEAD WITHOUT CONTRAST TECHNIQUE: Multiplanar, multiecho pulse sequences of the brain and surrounding structures were obtained without intravenous contrast. COMPARISON:  Noncontrast head CT performed earlier the same day 10/27/2019, brain MRI 07/31/2019 FINDINGS: Brain: Multiple sequences are significantly motion degraded, limiting evaluation. Most notably there is moderate/severe motion degradation of the axial T2* sequence, moderate/severe motion degradation of the axial T1 weighted sequence, severe motion degradation of the axial T2 FLAIR sequence, severe motion degradation of the axial T2 weighted sequence, severe motion degradation of the coronal T2 weighted sequence and severe motion degradation of the sagittal T1 weighted sequence. There is an 8 mm focus of restricted diffusion consistent with acute infarct within the medial left temporal occipital cortex (series 3, image 20). No acute infarct is identified elsewhere within the brain. Redemonstrated moderate chronic small vessel ischemic changes within the cerebral white matter. Stable appearance of less prominent chronic small-vessel ischemic changes within the deep gray nuclei and pons in the limitations of motion degradation. No evidence of intracranial mass is identified. No chronic intracranial blood products identified on motion degraded imaging. No extra-axial fluid collection is identified. No midline shift. Vascular: Expected proximal arterial flow voids. Skull and upper cervical spine: No focal marrow lesion within the limitations of significant motion degradation. Sinuses/Orbits: Visualized orbits show no acute finding. No significant paranasal sinus  disease or mastoid effusion at the imaged levels. IMPRESSION: 1. Significantly motion degraded and limited examination as described. 2. 8 mm acute cortical infarct within the medial left temporal occipital lobes. 3. Redemonstrated moderate chronic small vessel ischemic changes within the cerebral white matter. Less severe chronic ischemic changes are also again demonstrated within the deep gray nuclei and pons. These findings are stable as compared MRI 07/31/2019 within the limitations of significant motion degradation. Electronically Signed   By: Jackey Loge DO   On: 10/27/2019 17:12    EKG: Independently reviewed.  Sinus rhythm, no acute ST-T changes and no significant PR or QTC changes  Assessment/Plan Active Problems:   CVA (cerebral vascular accident) (HCC)  Acute CVA -Given the location of the infarction, suspect embolic event, further suspect the patient has underlying paroxysmal A. Fib.  Patient was hospitalized in February this year for sudden onset confusion, EEG was suspicious for new onset of seizure and patient was  discharged on Depakote.  The son reported patient did not tolerate Depakote well, and on February 3, she developed palpitations and chest pain came to ED was found to have rapid A. fib with heart rate in the 200s, decision was made to stop Depakote, and patient never had resuming symptoms since. -Admit to tele, may need outpt Holter monitoring if inpatient arrhythmia negative -ASA and start statin, check lipid panel -PT OT -Echo and Carotid doppler  HTN Hold scheduled BP meds PRN Hydralazine for now  IIDM Sliding scale for now   DVT prophylaxis: Lovenox Code Status: Full code Family Communication: None daughter at bedside Disposition Plan: A. fib work-up and other stroke work-up, likely will need more than 2 midnight hospital stay Consults called: Neurology Admission status: Tele admission   Lequita Halt MD [facility] Pager [phone]    10/27/2019,  6:39 PM

## 2019-10-27 NOTE — Consult Note (Signed)
Neurology Consultation Reason for Consult: Stroke Referring Physician: Wynetta Fines  CC: Aphasia  History is obtained from: Daughter, son  HPI: Suzanne Cantu is a 86 y.o. right-handed female who was recently evaluated for recurrent episodes of left-sided weakness with a question of "hand drawing."  She was recently diagnosed with new onset atrial fibrillation as well.  She also has intermittent episodes of confusion without numbness.  She sees Dr. Manuella Ghazi as an outpatient who suspects recurrent TIAs.  While being evaluated here, EEGs were obtained showing significant right sided dysfunction without clear evidence of epileptogenicity.  This dysfunction subsequently improved on repeat EEG.  There was concern that this slowing could have represented a postictal state therefore she was started on Depakote but had significant adverse events with it, particularly sedation.  These episodes have always involved the left side previously, but this morning her son responded to hearing her fall.  On arrival, he found her on the ground with what sounds like increased tone in her right arm.  She was perseverating on the word "yeah" and was unable to follow commands reliably.  He put her back to bed, to see if she would improve, but she was still having problems when he went to get her later and therefore she was transported to the hospital.  She has never had any jerking type movements of either side.   LKW: 9:30 PM. tpa given?: no, outside of window.  Premorbid modified rankin scale: 4  ROS: A complete ROS was performed and is negative except as noted in the HPI.   Past Medical History:  Diagnosis Date  . Diabetes mellitus without complication ([place])   . Hypertension      No family history on file.   Social History:  reports that she has never smoked. She has never used smokeless tobacco. She reports that she does not drink alcohol. No history on file for drug.   Exam: Current vital  signs: BP (!) 154/74   Pulse 77   Temp 99.7 F (37.6 C) (Oral)   Resp 19   Ht 5' (1.524 m)   Wt 53.8 kg   SpO2 100%   BMI 23.16 kg/m  Vital signs in last 24 hours: Temp:  [99.3 F (37.4 C)-99.7 F (37.6 C)] 99.7 F (37.6 C) (04/26 1500) Pulse Rate:  [71-92] 77 (04/26 2000) Resp:  [15-20] 19 (04/26 2000) BP: (154-178)/(66-89) 154/74 (04/26 2000) SpO2:  [98 %-100 %] 100 % (04/26 2000) Weight:  [53.8 kg] 53.8 kg (04/26 1115)   Physical Exam  Constitutional: Appears well-developed and well-nourished.  Psych: Affect appropriate to situation Eyes: No scleral injection HENT: No OP obstrucion MSK: no joint deformities.  Cardiovascular: Normal rate and regular rhythm.  Respiratory: Effort normal, non-labored breathing GI: Soft.  No distension. There is no tenderness.  Skin: WDI  Neuro: Mental Status: Patient is awake, alert, oriented to person, hospital but not which 1. Patient is able to give a clear and coherent history. No signs of aphasia or neglect Cranial Nerves: II: Visual Fields are full. R pupil 71mm, L 50mm, both equally reactive.  III,IV, VI: EOMI without ptosis or diploplia.  V: Facial sensation is symmetric to temperature VII: Facial movement with mild decrease in NL fold on the left.  VIII: hearing is intact to voice X: Uvula elevates symmetrically XI: Shoulder shrug is symmetric. XII: tongue is midline without atrophy or fasciculations.  Motor: Tone is normal. Bulk is normal. 5/5 strength was present on the right, 4+/5 in  the left arm and leg.  Sensory: Sensation is symmetric to light touch and temperature in the arms and legs. Cerebellar: No clear ataxia  I have reviewed labs in epic and the results pertinent to this consultation are: Na 133  I have reviewed the images obtained: MRI brain-small temporal occipital stroke on the left.  Diagnosis: Ischemic stroke due to embolism of left MCA  Impression: 86 year old female with recurrent episodes of  unclear etiology.  There was some concern for seizures with her previous hospitalization, but no definite findings.  Today's presentation of right-sided weakness with aphasia followed by mostly resolved symptoms, with persistent ischemia on MRI in the setting of atrial fibrillation without anticoagulation would argue for an ischemic nature to today's presentation.  Her previous episodes are less clear, the description of anosognosia with left-sided symptoms is more suggestive of ischemia then seizure, but the posturing of the hand would be more typical of seizure.  At this point, I do not feel I would start any type of antiepileptic, but would focus on the current presentation which is driven by ischemia.  I suspect that she had a large area at risk initially, with the ischemia seen on MRI representing persistent ischemia.  Though she has a fall risk, may need to have consideration of starting anticoagulation.  Recommendations: - HgbA1c, fasting lipid panel - MRI, MRA  of the brain without contrast - Frequent neuro checks - Echocardiogram - Carotid dopplers - Prophylactic therapy-Antiplatelet med: Aspirin - dose 325mg  PO or 300mg  PR - Risk factor modification - Telemetry monitoring - PT consult, OT consult, Speech consult -EEG - Stroke team to follow  , MD [facility] [phone]  If 7pm- 7am, please page neurology on call as listed in AMION.

## 2019-10-27 NOTE — ED Notes (Signed)
Attempted to call report 2x.  

## 2019-10-27 NOTE — ED Notes (Signed)
Pt went to MRI.

## 2019-10-27 NOTE — ED Notes (Signed)
Called lab RE results.

## 2019-10-28 ENCOUNTER — Encounter (HOSPITAL_COMMUNITY): Payer: Medicare Other

## 2019-10-28 ENCOUNTER — Inpatient Hospital Stay (HOSPITAL_COMMUNITY): Payer: Medicare Other

## 2019-10-28 DIAGNOSIS — I63432 Cerebral infarction due to embolism of left posterior cerebral artery: Secondary | ICD-10-CM

## 2019-10-28 DIAGNOSIS — E876 Hypokalemia: Secondary | ICD-10-CM

## 2019-10-28 DIAGNOSIS — I340 Nonrheumatic mitral (valve) insufficiency: Secondary | ICD-10-CM

## 2019-10-28 DIAGNOSIS — R569 Unspecified convulsions: Secondary | ICD-10-CM

## 2019-10-28 LAB — LIPID PANEL
Cholesterol: 185 mg/dL (ref 0–200)
HDL: 51 mg/dL (ref >40)
LDL Cholesterol: 116 mg/dL — ABNORMAL HIGH (ref 0–99)
Total CHOL/HDL Ratio: 3.6 RATIO
Triglycerides: 90 mg/dL (ref <150)
VLDL: 18 mg/dL (ref 0–40)

## 2019-10-28 LAB — ECHOCARDIOGRAM COMPLETE
Height: 60 in
Weight: 1806.01 oz

## 2019-10-28 LAB — RESPIRATORY PANEL BY RT PCR (FLU A&B, COVID)
Influenza A by PCR: NEGATIVE
Influenza B by PCR: NEGATIVE
SARS Coronavirus 2 by RT PCR: NEGATIVE

## 2019-10-28 LAB — GLUCOSE, CAPILLARY: Glucose-Capillary: 122 mg/dL — ABNORMAL HIGH (ref 70–99)

## 2019-10-28 LAB — HEMOGLOBIN A1C
Hgb A1c MFr Bld: 6.5 % — ABNORMAL HIGH (ref 4.8–5.6)
Mean Plasma Glucose: 139.85 mg/dL

## 2019-10-28 MED ORDER — SODIUM CHLORIDE 0.9 % IV SOLN: INTRAVENOUS | Status: AC

## 2019-10-28 NOTE — Progress Notes (Signed)
Received pt from ED, alert and oriented, but still confused, implemented MD orders, placed on tele and verified, call light in reach, Unfortunately there were no low beds hence floor mats used as this patient qualifies for low bed, will continue to monitor

## 2019-10-28 NOTE — Progress Notes (Signed)
  Echocardiogram 2D Echocardiogram has been performed.  Gerda Diss 10/28/2019, 8:42 AM

## 2019-10-28 NOTE — Progress Notes (Signed)
SLP Cancellation Note  Patient Details Name: Suzanne Cantu MRN: [id number] DOB: 01-01-1927   Cancelled evaluation:         Pt sleeping soundly.  D/W daughter, [name] - we agreed to try again tomorrow to complete speech/language evaluation. Per notes, pt is aphasic after this CVA.  If pt is D/Cd home before SLP service sees her, please ensure [facility] SLP is consulted.   Ladell Bey L. Samson Frederic, MA CCC/SLP Acute Rehabilitation Services Office number [phone] Pager [phone]   Carolan Shiver 10/28/2019, 3:32 PM

## 2019-10-28 NOTE — Progress Notes (Signed)
EEG complete - results pending 

## 2019-10-28 NOTE — Procedures (Signed)
Patient Name: Suzanne Cantu  MRN: [id number]  Epilepsy Attending: Charlsie Quest  Referring Physician/Provider: Dr. Onalee Hua Date: 10/28/2019 Duration: 24.34 minutes  Patient history: 86 year old female with recurrent episodes of unclear etiology, most recent Suzanne Cantu presented with right-sided weakness with aphasia.  EEG to evaluate for seizures.  Level of alertness: Awake  AEDs during EEG study: None  Technical aspects: This EEG study was done with scalp electrodes positioned according to the 10-20 International system of electrode placement. Electrical activity was acquired at a sampling rate of 500Hz  and reviewed with a high frequency filter of 70Hz  and a low frequency filter of 1Hz . EEG data were recorded continuously and digitally stored.   Description:: The posterior dominant rhythm consists of 8-9 Hz activity of moderate voltage (25-35 uV) seen predominantly in posterior head regions, symmetric and reactive to eye opening and eye closing. Intermittent left temporal 2 to 3 Hz delta slowing was also noted. Hyperventilation and photic stimulation were not performed.  Abnormality -Intermittent slow, left temporal region  IMPRESSION: This study is suggestive of nonspecific cortical dysfunction in left temporal region.  No seizures or epileptiform discharges were seen throughout the recording.  Suzanne Cantu 

## 2019-10-28 NOTE — Social Work (Signed)
CSW was unable to complete sbirt due to pt being oriented x2 being on the ventilator. CSW may attempt to complete at more appropriate time.   Jimmy Picket, Theresia Majors, [place] Clinical Social Worker [phone]

## 2019-10-28 NOTE — Progress Notes (Addendum)
Physical Therapy Evaluation Patient Details Name: Suzanne Cantu MRN: [id number] DOB: [birth year] Today's Date: 10/28/2019   History of Present Illness  86 y.o. female with medical history significant of essential hypertension diabetes mellitus type 2, chronic pain, paroxysmal A. Fib, ?TIAs vs seizures (demonstrates left-sided weakness that then resolves) presented to ED 10/27/19 after son found her lying on the bathroom floor with new rt-sided weakness and aphasia; MRI showing new acute left-sided temporal occipital infarction  Clinical Impression   Pt admitted with above diagnosis. Patient lived alone PTA, however between family members and hired caregivers she was never home alone. She was able to ambulate with RW modified independently inside home. She currently requires assist with transfers and gait to reduce risk of falling. Difficult to assess her cognition due to expressive language deficit. Daughter present during evaluation and family committed to pt returning to her own home.  Pt currently with functional limitations due to the deficits listed below (see PT Problem List). Pt will benefit from skilled PT to increase their independence and safety with mobility to allow discharge to the venue listed below.       Follow Up Recommendations Home health PT;Supervision/Assistance - 24 hour    Equipment Recommendations  None recommended by PT    Recommendations for Other Services       Precautions / Restrictions Precautions Precautions: Fall Restrictions Weight Bearing Restrictions: No      Mobility  Bed Mobility Overal bed mobility: Needs Assistance Bed Mobility: Supine to Sit     Supine to sit: Supervision     General bed mobility comments: able to exit the bed on the Right side  Transfers Overall transfer level: Needs assistance Equipment used: Rolling walker (2 wheeled) Transfers: Sit to/from Stand Sit to Stand: Min assist         General transfer comment: pt  able to power up with posterior lean on bed and pulling on RW. pt braced against bed then able to anteriorly shift weight into static standing.   Ambulation/Gait Ambulation/Gait assistance: Min assist;+2 physical assistance;+2 safety/equipment Gait Distance (Feet): 15 Feet(x2 (to/from toilet)) Assistive device: Rolling walker (2 wheeled) Gait Pattern/deviations: Step-through pattern;Decreased stride length;Leaning posteriorly;Drifts right/left Gait velocity: decr   General Gait Details: unsteady, decr velocity (per daughter not her normal walking)  Stairs            Wheelchair Mobility    Modified Rankin (Stroke Patients Only) Modified Rankin (Stroke Patients Only) Pre-Morbid Rankin Score: Moderately severe disability Modified Rankin: Moderately severe disability     Balance Overall balance assessment: Needs assistance Sitting-balance support: Bilateral upper extremity supported;Feet supported Sitting balance-Leahy Scale: Fair     Standing balance support: Bilateral upper extremity supported;During functional activity Standing balance-Leahy Scale: Poor Standing balance comment: reliant on RW to pull up and static sustain standing with staff support                             Pertinent Vitals/Pain Pain Assessment: No/denies pain    Home Living Family/patient expects to be discharged to:: Private residence Living Arrangements: Alone Available Help at Discharge: Family;Available PRN/intermittently Type of Home: House Home Access: Ramped entrance     Home Layout: Two level;Able to live on main level with bedroom/bathroom Home Equipment: Dan Humphreys - 2 wheels;Shower seat;Grab bars - toilet;Grab bars - tub/shower;Hand held shower head;Wheelchair - Hotel manager - 4 wheels(w/c was spouse so too big) Additional Comments: daughter is there 2 weeks month 24/7,  second daughter stays over night, multiple paid caregivers, extended family members stay  with patient. pt is never alone. Son and daughter lives in [place] and one daughter Marlowe Kays lives local    Prior Function Level of Independence: Independent with assistive device(s)   Gait / Transfers Assistance Needed: RW in the house  ADL's / Homemaking Assistance Needed: family helps with shower ( leonard brenda daughter and connie)  Comments: Uses RW per daughter request only more recently. Reports doing her own ADLs and some cooking. daughter helps with some IADls.     Hand Dominance   Dominant Hand: Right    Extremity/Trunk Assessment   Upper Extremity Assessment Upper Extremity Assessment: Defer to OT evaluation    Lower Extremity Assessment Lower Extremity Assessment: Generalized weakness(bil DF 4+)    Cervical / Trunk Assessment Cervical / Trunk Assessment: Kyphotic  Communication   Communication: Expressive difficulties  Cognition Arousal/Alertness: Awake/alert Behavior During Therapy: Impulsive Overall Cognitive Status: Impaired/Different from baseline Area of Impairment: Awareness;Memory;Following commands                     Memory: Decreased recall of precautions;Decreased short-term memory Following Commands: Follows one step commands with increased time   Awareness: Intellectual   General Comments: pt asking questions about d/c home multiple times during session. pt automatically started to exit the bed even after cues to remain bed level and seated. pt verbalized "we walking" in response to her static standing at eob.       General Comments General comments (skin integrity, edema, etc.): daughter present throughout session    Exercises     Assessment/Plan    PT Assessment Patient needs continued PT services  PT Problem List Decreased strength;Decreased balance;Decreased mobility;Decreased cognition;Decreased knowledge of use of DME;Decreased safety awareness       PT Treatment Interventions DME instruction;Gait training;Functional  mobility training;Therapeutic activities;Balance training;Neuromuscular re-education;Cognitive remediation;Patient/family education    PT Goals (Current goals can be found in the Care Plan section)  Acute Rehab PT Goals Patient Stated Goal: to go home PT Goal Formulation: With patient/family Time For Goal Achievement: 11/11/19 Potential to Achieve Goals: Good    Frequency Min 4X/week   Barriers to discharge        Co-evaluation PT/OT/SLP Co-Evaluation/Treatment: Yes Reason for Co-Treatment: To address functional/ADL transfers PT goals addressed during session: Mobility/safety with mobility;Balance;Proper use of DME         AM-PAC PT "6 Clicks" Mobility  Outcome Measure Help needed turning from your back to your side while in a flat bed without using bedrails?: None Help needed moving from lying on your back to sitting on the side of a flat bed without using bedrails?: A Little Help needed moving to and from a bed to a chair (including a wheelchair)?: A Little Help needed standing up from a chair using your arms (e.g., wheelchair or bedside chair)?: A Little Help needed to walk in hospital room?: A Lot Help needed climbing 3-5 steps with a railing? : A Lot 6 Click Score: 17    End of Session Equipment Utilized During Treatment: Gait belt Activity Tolerance: Patient tolerated treatment well Patient left: in chair;with call bell/phone within reach;with chair alarm set;with family/visitor present Nurse Communication: Mobility status PT Visit Diagnosis: Unsteadiness on feet (R26.81);Other abnormalities of gait and mobility (R26.89);History of falling (Z91.81)    Time: 4098-1191 PT Time Calculation (min) (ACUTE ONLY): 28 min   Charges:   PT Evaluation $PT Eval Low Complexity: 1 Low  Jerolyn Center, PT Pager [phone]   Zena Amos 10/28/2019, 2:26 PM

## 2019-10-28 NOTE — Progress Notes (Signed)
STROKE TEAM PROGRESS NOTE   INTERVAL HISTORY Her youngest daughter Suzanne Cantu is at the bedside.  I have personally reviewed history of presenting illness with the patient, her daughter as well as electronic medical records and imaging films in PACS.  She presented with a fall at home with increased confusion and right-sided weakness and speech difficulties.  MRI scan confirms embolic left temporal infarct.  She had recent diagnosis of A. fib.  She has dementia at baseline and is a fall risk and not a good long-term anticoagulation candidate  Vitals:   10/27/19 2359 10/28/19 0338 10/28/19 0914 10/28/19 1127  BP: (!) 169/86 (!) 168/72 (!) 163/76 (!) 141/58  Pulse: 83 83 (!) 107 88  Resp: 20 20 20 17   Temp: 98.1 F (36.7 C) 98.6 F (37 C) 97.9 F (36.6 C) 98.2 F (36.8 C)  TempSrc: Oral Oral Oral Oral  SpO2: 98% 100% 99% 99%  Weight:      Height:        CBC:  Recent Labs  Lab 10/27/19 1138  WBC 8.1  HGB 11.8*  HCT 36.0  MCV 91.1  PLT 867    Basic Metabolic Panel:  Recent Labs  Lab 10/27/19 1138  NA 133*  K 3.4*  CL 99  CO2 20*  GLUCOSE 153*  BUN 6*  CREATININE 0.52  CALCIUM 8.2*   Lipid Panel:     Component Value Date/Time   CHOL 185 10/28/2019 0536   TRIG 90 10/28/2019 0536   HDL 51 10/28/2019 0536   CHOLHDL 3.6 10/28/2019 0536   VLDL 18 10/28/2019 0536   LDLCALC 116 (H) 10/28/2019 0536   HgbA1c:  Lab Results  Component Value Date   HGBA1C 6.5 (H) 10/28/2019   Urine Drug Screen: No results found for: LABOPIA, COCAINSCRNUR, LABBENZ, AMPHETMU, THCU, LABBARB  Alcohol Level     Component Value Date/Time   ETH <10 03/12/2019 1920    IMAGING past 24 hours CT ANGIO HEAD W OR WO CONTRAST  Result Date: 10/27/2019 CLINICAL DATA:  Stroke follow-up EXAM: CT ANGIOGRAPHY HEAD AND NECK TECHNIQUE: Multidetector CT imaging of the head and neck was performed using the standard protocol during bolus administration of intravenous contrast. Multiplanar CT image  reconstructions and MIPs were obtained to evaluate the vascular anatomy. Carotid stenosis measurements (when applicable) are obtained utilizing NASCET criteria, using the distal internal carotid diameter as the denominator. CONTRAST:  55mL OMNIPAQUE IOHEXOL 350 MG/ML SOLN COMPARISON:  None. FINDINGS: CTA NECK FINDINGS SKELETON: There is no bony spinal canal stenosis. No lytic or blastic lesion. OTHER NECK: Normal pharynx, larynx and major salivary glands. No cervical lymphadenopathy. Unremarkable thyroid gland. UPPER CHEST: No pneumothorax or pleural effusion. No nodules or masses. AORTIC ARCH: There is mild calcific atherosclerosis of the aortic arch. There is no aneurysm, dissection or hemodynamically significant stenosis of the visualized portion of the aorta. Conventional 3 vessel aortic branching pattern. The visualized proximal subclavian arteries are widely patent. RIGHT CAROTID SYSTEM: No dissection, occlusion or aneurysm. Mild atherosclerotic calcification at the carotid bifurcation without hemodynamically significant stenosis. LEFT CAROTID SYSTEM: Normal without aneurysm, dissection or stenosis. VERTEBRAL ARTERIES: Codominant configuration. Moderate atherosclerosis of the left V1 segment. The right origin is normal aside from mild atherosclerotic calcification. CTA HEAD FINDINGS POSTERIOR CIRCULATION: --Vertebral arteries: Mild bilateral atherosclerotic calcification without hemodynamically significant stenosis. --Posterior inferior cerebellar arteries (PICA): Patent origins from the vertebral arteries. --Anterior inferior cerebellar arteries (AICA): Patent origins from the basilar artery. --Basilar artery: Moderate narrowing of the distal basilar artery. --  Superior cerebellar arteries: Normal. --Posterior cerebral arteries: Normal. The right PCA is partially supplied by a posterior communicating artery (p-comm). ANTERIOR CIRCULATION: --Intracranial internal carotid arteries: Atherosclerotic calcification  of the internal carotid arteries at the skull base without hemodynamically significant stenosis. 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 There is multifocal mild-to-moderate narrowing of the A2 segments. --Middle cerebral arteries (MCA): No occlusion. Multifocal mild-to-moderate narrowing of both middle cerebral arteries, left-greater-than-right. VENOUS SINUSES: As permitted by contrast timing, patent. ANATOMIC VARIANTS: None Review of the MIP images confirms the above findings. IMPRESSION: 1. No intracranial arterial occlusion or high-grade stenosis. 2. Moderate narrowing of the distal basilar artery. 3. Multifocal mild-to-moderate narrowing of the anterior and middle cerebral arteries. 4. Moderate stenosis of the left vertebral artery V1 segment. 5. Aortic Atherosclerosis (ICD10-I70.0). Electronically Signed   By: Deatra Robinson M.D.   On: 10/27/2019 22:38   DG Chest 2 View  Result Date: 10/27/2019 CLINICAL DATA:  Altered mental status.  Hypertension. EXAM: CHEST - 2 VIEW COMPARISON:  August 06, 2019 FINDINGS: There is atelectatic change in the right base. The lungs elsewhere are clear. Heart size and pulmonary vascularity are normal. No adenopathy. There is aortic atherosclerosis. Bones are osteoporotic. IMPRESSION: Atelectasis right base. Lungs elsewhere clear. Stable cardiac silhouette. Aortic Atherosclerosis (ICD10-I70.0). Electronically Signed   By: Bretta Bang III M.D.   On: 10/27/2019 14:02   CT Head Wo Contrast  Result Date: 10/27/2019 CLINICAL DATA:  Altered mental status. Last seen normal last night. Found on the floor this morning. EXAM: CT HEAD WITHOUT CONTRAST TECHNIQUE: Contiguous axial images were obtained from the base of the skull through the vertex without intravenous contrast. COMPARISON:  07/31/2019 FINDINGS: Brain: Age related atrophy. Chronic small-vessel ischemic changes of the cerebral hemispheric white matter. No sign of acute  infarction, mass lesion, hemorrhage, hydrocephalus or extra-axial collection. Vascular: There is atherosclerotic calcification of the major vessels at the base of the brain. Skull: Negative Sinuses/Orbits: Clear/normal Other: None IMPRESSION: No acute finding by CT. Age related atrophy. Chronic small-vessel ischemic changes of the cerebral hemispheric white matter. Electronically Signed   By: Paulina Fusi M.D.   On: 10/27/2019 13:26   CT ANGIO NECK W OR WO CONTRAST  Result Date: 10/27/2019 CLINICAL DATA:  Stroke follow-up EXAM: CT ANGIOGRAPHY HEAD AND NECK TECHNIQUE: Multidetector CT imaging of the head and neck was performed using the standard protocol during bolus administration of intravenous contrast. Multiplanar CT image reconstructions and MIPs were obtained to evaluate the vascular anatomy. Carotid stenosis measurements (when applicable) are obtained utilizing NASCET criteria, using the distal internal carotid diameter as the denominator. CONTRAST:  45mL OMNIPAQUE IOHEXOL 350 MG/ML SOLN COMPARISON:  None. FINDINGS: CTA NECK FINDINGS SKELETON: There is no bony spinal canal stenosis. No lytic or blastic lesion. OTHER NECK: Normal pharynx, larynx and major salivary glands. No cervical lymphadenopathy. Unremarkable thyroid gland. UPPER CHEST: No pneumothorax or pleural effusion. No nodules or masses. AORTIC ARCH: There is mild calcific atherosclerosis of the aortic arch. There is no aneurysm, dissection or hemodynamically significant stenosis of the visualized portion of the aorta. Conventional 3 vessel aortic branching pattern. The visualized proximal subclavian arteries are widely patent. RIGHT CAROTID SYSTEM: No dissection, occlusion or aneurysm. Mild atherosclerotic calcification at the carotid bifurcation without hemodynamically significant stenosis. LEFT CAROTID SYSTEM: Normal without aneurysm, dissection or stenosis. VERTEBRAL ARTERIES: Codominant configuration. Moderate atherosclerosis of the left V1  segment. The right origin is normal aside from mild atherosclerotic calcification. CTA HEAD FINDINGS POSTERIOR  CIRCULATION: --Vertebral arteries: Mild bilateral atherosclerotic calcification without hemodynamically significant stenosis. --Posterior inferior cerebellar arteries (PICA): Patent origins from the vertebral arteries. --Anterior inferior cerebellar arteries (AICA): Patent origins from the basilar artery. --Basilar artery: Moderate narrowing of the distal basilar artery. --Superior cerebellar arteries: Normal. --Posterior cerebral arteries: Normal. The right PCA is partially supplied by a posterior communicating artery (p-comm). ANTERIOR CIRCULATION: --Intracranial internal carotid arteries: Atherosclerotic calcification of the internal carotid arteries at the skull base without hemodynamically significant stenosis. 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 There is multifocal mild-to-moderate narrowing of the A2 segments. --Middle cerebral arteries (MCA): No occlusion. Multifocal mild-to-moderate narrowing of both middle cerebral arteries, left-greater-than-right. VENOUS SINUSES: As permitted by contrast timing, patent. ANATOMIC VARIANTS: None Review of the MIP images confirms the above findings. IMPRESSION: 1. No intracranial arterial occlusion or high-grade stenosis. 2. Moderate narrowing of the distal basilar artery. 3. Multifocal mild-to-moderate narrowing of the anterior and middle cerebral arteries. 4. Moderate stenosis of the left vertebral artery V1 segment. 5. Aortic Atherosclerosis (ICD10-I70.0). Electronically Signed   By: Deatra RobinsonKevin  Herman M.D.   On: 10/27/2019 22:38   MR Brain Wo Contrast (neuro protocol)  Result Date: 10/27/2019 CLINICAL DATA:  Focal neuro deficit, greater than 6 hours, stroke suspected. Altered mental status, unclear cause. EXAM: MRI HEAD WITHOUT CONTRAST TECHNIQUE: Multiplanar, multiecho pulse sequences of the brain and surrounding  structures were obtained without intravenous contrast. COMPARISON:  Noncontrast head CT performed earlier the same day 10/27/2019, brain MRI 07/31/2019 FINDINGS: Brain: Multiple sequences are significantly motion degraded, limiting evaluation. Most notably there is moderate/severe motion degradation of the axial T2* sequence, moderate/severe motion degradation of the axial T1 weighted sequence, severe motion degradation of the axial T2 FLAIR sequence, severe motion degradation of the axial T2 weighted sequence, severe motion degradation of the coronal T2 weighted sequence and severe motion degradation of the sagittal T1 weighted sequence. There is an 8 mm focus of restricted diffusion consistent with acute infarct within the medial left temporal occipital cortex (series 3, image 20). No acute infarct is identified elsewhere within the brain. Redemonstrated moderate chronic small vessel ischemic changes within the cerebral white matter. Stable appearance of less prominent chronic small-vessel ischemic changes within the deep gray nuclei and pons in the limitations of motion degradation. No evidence of intracranial mass is identified. No chronic intracranial blood products identified on motion degraded imaging. No extra-axial fluid collection is identified. No midline shift. Vascular: Expected proximal arterial flow voids. Skull and upper cervical spine: No focal marrow lesion within the limitations of significant motion degradation. Sinuses/Orbits: Visualized orbits show no acute finding. No significant paranasal sinus disease or mastoid effusion at the imaged levels. IMPRESSION: 1. Significantly motion degraded and limited examination as described. 2. 8 mm acute cortical infarct within the medial left temporal occipital lobes. 3. Redemonstrated moderate chronic small vessel ischemic changes within the cerebral white matter. Less severe chronic ischemic changes are also again demonstrated within the deep gray nuclei  and pons. These findings are stable as compared MRI 07/31/2019 within the limitations of significant motion degradation. Electronically Signed   By: Jackey LogeKyle  Golden DO   On: 10/27/2019 17:12   EEG adult  Result Date: 10/28/2019 Charlsie QuestYadav, Priyanka O, MD     10/28/2019 10:27 AM Patient Name: Suzanne Cantu MRN: [id number] Epilepsy Attending: Charlsie QuestPriyanka O Yadav Referring Physician/Provider: Dr. Onalee HuaMcNeil Kirkpatrick Date: 10/28/2019 Duration: 24.34 minutes Patient history: 86 year old female with recurrent episodes of unclear etiology, most recent Nedra HaiLee presented with right-sided  weakness with aphasia.  EEG to evaluate for seizures. Level of alertness: Awake AEDs during EEG study: None Technical aspects: This EEG study was done with scalp electrodes positioned according to the 10-20 International system of electrode placement. Electrical activity was acquired at a sampling rate of 500Hz  and reviewed with a high frequency filter of 70Hz  and a low frequency filter of 1Hz . EEG data were recorded continuously and digitally stored. Description:: The posterior dominant rhythm consists of 8-9 Hz activity of moderate voltage (25-35 uV) seen predominantly in posterior head regions, symmetric and reactive to eye opening and eye closing. Intermittent left temporal 2 to 3 Hz delta slowing was also noted. Hyperventilation and photic stimulation were not performed. Abnormality -Intermittent slow, left temporal region IMPRESSION: This study is suggestive of nonspecific cortical dysfunction in left temporal region.  No seizures or epileptiform discharges were seen throughout the recording. Priyanka O Yadav    PHYSICAL EXAM Pleasant frail elderly Caucasian lady not in distress. . Afebrile. Head is nontraumatic. Neck is supple without bruit.    Cardiac exam no murmur or gallop. Lungs are clear to auscultation. Distal pulses are well felt. Neurological Exam ;  Awake  Alert oriented x 1 to person only.  Mildly dysarthric speech which is  nonfluent with poor repetition and naming.  Diminished attention, registration and recall.  Poor insight into her condition.  eye movements full without nystagmus.fundi were not visualized. Vision acuity and fields appear normal. Hearing is normal. Palatal movements are normal. Face symmetric. Tongue midline. Normal strength, tone, reflexes and coordination. Normal sensation. Gait deferred.  ASSESSMENT/PLAN Ms. Suzanne Cantu is a 86 y.o. 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 Small vessel disease. Atrophy.     MRI  Medial L temporal occipital lobe infarct  CTA head & neck no LVO. Distal BA w/ moderate narrowing. ACA and MCA w/ multifocal mild to moderate narrowing. Aortic atherosclerosis.   2D Echo normal ejection fraction 60 to 65%.  LDL 116  HgbA1c 6.5  Lovenox 40 mg sq daily for VTE prophylaxis  aspirin 81 mg daily prior to admission, now on aspirin 81 mg daily. Continue aspirin alone (pt refused plavix in the past and family do not want to pressure her to take meds she is not agreeable to)   Therapy recommendations:  pending   Disposition:  pending   Atrial Fibrillation  Home anticoagulation:  none   On aspirin 81 mg daily  Not a good AC candidate given baseline dementia, predisposition to refuse meds and need for 24h care   Hypertension  Stable . Permissive hypertension (OK if < 220/120) but gradually normalize in 5-7 days . Long-term BP goal normotensive  Hyperlipidemia  Home meds:  No statin  LDL 116, goal < 70  Will not add statin given pt / family preference  Diabetes type II Controlled  HgbA1c 6.5, goal < 7.0  Other Stroke Risk Factors  Advanced age  Other Active Problems  Baseline dementia requiring care at home that family provides   Hospital day # 1  Patient presented  with fall and altered mental status with speech difficulties secondary to embolic left temporal MCA branch infarct likely from new onset atrial fibrillation.  She has not been on anticoagulation due to fall risk and dementia.  Agree with continuing aspirin and aggressive risk factor modification.  Physical occupational and speech therapy consults.  Long discussion with the patient and daughter at the bedside and answered questions.  Greater than 50% time during this 35-minute visit was spent in counseling and coordination of care about an embolic stroke and atrial fibrillation and discussion about risk benefits of anticoagulation and answering questions.  Discussed with Dr. Nils Pyle, MD To contact Stroke Continuity provider, please refer to [email or web address]. After hours, contact General Neurology

## 2019-10-28 NOTE — Evaluation (Signed)
Occupational Therapy Evaluation Patient Details Name: Suzanne Cantu MRN: [id number] DOB: 03/30/1927 Today's Date: 10/28/2019    History of Present Illness 86 y.o. female with medical history significant of essential hypertension diabetes mellitus type 2, chronic pain, paroxysmal A. 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 Pt currently with functional limitiations due to the deficits listed below (see OT problem list). Pt currently requires increased (A) with RW and min (A). Pt with decrease awareness to deficits. Pt with repetitive request "am I going home today?" Daughter reports 24/7 is available and she is never alone. Pt will benefit from skilled OT to increase their independence and safety with adls and balance to allow discharge hhot.     Follow Up Recommendations  Home health OT    Equipment Recommendations  None recommended by OT    Recommendations for Other Services Speech consult     Precautions / Restrictions Precautions Precautions: Fall Restrictions Weight Bearing Restrictions: No      Mobility Bed Mobility Overal bed mobility: Needs Assistance Bed Mobility: Supine to Sit     Supine to sit: Supervision     General bed mobility comments: able to exit the bed on the Right side  Transfers Overall transfer level: Needs assistance Equipment used: Rolling walker (2 wheeled) Transfers: Sit to/from Stand Sit to Stand: Min assist         General transfer comment: pt able to power up with posterior lean on bed and pulling on RW. pt braced against bed then able to anteriorly shift weight into static standing.     Balance Overall balance assessment: Needs  assistance Sitting-balance support: Bilateral upper extremity supported;Feet supported Sitting balance-Leahy Scale: Fair     Standing balance support: Bilateral upper extremity supported;During functional activity Standing balance-Leahy Scale: Poor Standing balance comment: reliant on RW to pull up and static sustain standing with staff support                           ADL either performed or assessed with clinical judgement   ADL Overall ADL's : Needs assistance/impaired Eating/Feeding: Minimal assistance   Grooming: Wash/dry hands;Min guard;Standing Grooming Details (indicate cue type and reason): pt able to sequence task with increased time. pt states "warm" showing sensation to water intact                 Toilet Transfer: Moderate assistance;Regular Toilet;RW Statistician Details (indicate cue type and reason): requires (A) with sequence, management of clothing and RW. pt attempting to leave RW several times Toileting- Clothing Manipulation and Hygiene: Moderate assistance Toileting - Clothing Manipulation Details (indicate cue type and reason): pt incontinence noted on pad and new pad provided     Functional mobility during ADLs: Minimal assistance;Rolling walker General ADL Comments: Pt requires cues throughout session for safety with RW     Vision Baseline Vision/History: Wears glasses Wears Glasses: Reading only Vision Assessment?: Yes Eye Alignment: Within Functional Limits Ocular Range of Motion: Impaired-to be further tested in functional context Tracking/Visual Pursuits: Impaired - to be further tested in functional context Saccades: Impaired - to be further tested in functional context Additional Comments: pt turning head for all visual assessment. pt cued to track to  the L to locate wash cloth during session.      Perception     Praxis      Pertinent Vitals/Pain Pain Assessment: No/denies pain     Hand Dominance Right    Extremity/Trunk Assessment Upper Extremity Assessment Upper Extremity Assessment: Generalized weakness   Lower Extremity Assessment Lower Extremity Assessment: Generalized weakness   Cervical / Trunk Assessment Cervical / Trunk Assessment: Kyphotic   Communication Communication Communication: Expressive difficulties   Cognition Arousal/Alertness: Awake/alert Behavior During Therapy: Impulsive Overall Cognitive Status: Impaired/Different from baseline Area of Impairment: Awareness;Memory;Following commands                     Memory: Decreased recall of precautions;Decreased short-term memory Following Commands: Follows one step commands with increased time   Awareness: Intellectual   General Comments: pt asking questions about d/c home multiple times during session. pt automatically started to exit the bed even after cues to remain bed level and seated. pt verbalized "we walking" in response to her static standing at eob.    General Comments  pt increased risk for falls and decreased safety awareness. daughter present throughout session    Exercises     Shoulder Instructions      Home Living Family/patient expects to be discharged to:: Private residence Living Arrangements: Alone Available Help at Discharge: Family;Available PRN/intermittently Type of Home: House Home Access: Ramped entrance     Home Layout: Two level;Able to live on main level with bedroom/bathroom   Alternate Level Stairs-Rails: Right Bathroom Shower/Tub: Occupational psychologist: Standard     Home Equipment: Environmental consultant - 2 wheels;Shower seat;Grab bars - toilet;Grab bars - tub/shower;Hand held shower head;Wheelchair - Visual merchandiser - 4 wheels(w/c was spouse so too big)   Additional Comments: daughter is there 2 weeks month 24/7, second daughter stays over night, multiple paid caregivers, extended family members stay with patient. pt is never alone. Son and daughter  lives in [place] and one daughter Marlowe Kays lives local      Prior Functioning/Environment Level of Independence: Independent with assistive device(s)  Gait / Transfers Assistance Needed: RW in the house ADL's / Homemaking Assistance Needed: family helps with shower ( leonard brenda daughter and connie) Communication / Swallowing Assistance Needed: pt currently with slurred speech and expressive aphasia in room that daughter reports is new          OT Problem List: Decreased strength;Decreased activity tolerance;Impaired balance (sitting and/or standing);Decreased cognition;Decreased safety awareness;Decreased knowledge of use of DME or AE;Decreased knowledge of precautions      OT Treatment/Interventions:      OT Goals(Current goals can be found in the care plan section) Acute Rehab OT Goals Patient Stated Goal: to go home OT Goal Formulation: With patient/family Time For Goal Achievement: 11/11/19 Potential to Achieve Goals: Good  OT Frequency: Min 2X/week   Barriers to D/C:            Co-evaluation PT/OT/SLP Co-Evaluation/Treatment: Yes Reason for Co-Treatment: To address functional/ADL transfers;For patient/therapist safety;Necessary to address cognition/behavior during functional activity   OT goals addressed during session: ADL's and self-care;Strengthening/ROM      AM-PAC OT "6 Clicks" Daily Activity     Outcome Measure Help from another person eating meals?: A Little Help from another person taking care of personal grooming?: A Little Help from another person toileting, which includes using toliet, bedpan, or urinal?: A Little Help from another person bathing (including washing, rinsing, drying)?: A Little Help from another person to  put on and taking off regular upper body clothing?: A Little Help from another person to put on and taking off regular lower body clothing?: A Little 6 Click Score: 18   End of Session Equipment Utilized During Treatment: Gait  belt;Rolling walker Nurse Communication: Mobility status;Precautions  Activity Tolerance: Patient tolerated treatment well Patient left: in chair;with call bell/phone within reach;with chair alarm set;with family/visitor present  OT Visit Diagnosis: Unsteadiness on feet (R26.81);Muscle weakness (generalized) (M62.81)                Time: 9622-2979 OT Time Calculation (min): 27 min Charges:  OT General Charges $OT Visit: 1 Visit OT Evaluation $OT Eval Moderate Complexity: 1 Mod   Brynn, OTR/L  Acute Rehabilitation Services Pager: [phone] Office: [phone] .   Mateo Flow 10/28/2019, 11:39 AM

## 2019-10-28 NOTE — Progress Notes (Signed)
PROGRESS NOTE  Frannie Shedrick GSU:[id number] DOB: [birth year] DOA: 10/27/2019 PCP: Cletis Athens, MD   Jones Skene Escorcia is a 86 y.o. female with medical history significant of essential hypertension diabetes mellitus type 2, chronic pain, paroxysmal A. Fib (which only happen once in August 06, 2019 after taking Depakote, Depakote was discontinued afterwards).  Patient lives with her son, and this morning, the son heard a loud thump went to see the patient, and found patient was lying down on the bathroom floor, conscious but confused.  And resulted on patient looks like at new onset right-sided weakness, and the patient is right-sided dominant.  Patient denies any chest pain, and claims that she does not feel any weakness or numbness of any of the limbs.  No blurred vision or hearing changes. ED Course: MRI showing new acute left-sided temporal occipital infarction.   HPI/Recap of past 24 hours:  She is sitting up in chair, denies pain She has dementia, currently confused about the month,  Daughter at bedside providing history  Assessment/Plan: Active Problems:   CVA (cerebral vascular accident) ([place])   Acute CVA while on aspirin 81 mg Likely due to A. Fib, neurology consulted, she is poor candidate for anticoagulation Continue current dose of aspirin aspirin per neurology recommendation Continue Lipitor Allow permissive hypertension, gradually normalize blood pressure in the next 5 to 7 days Daughter aware of risk of recurrent stroke and continued decline, she desires to talk to palliative care while patient is in the hospital Palliative care consult  PAF Currently in sinus rhythm without rate or rhythm control medication  Hypertension Currently allow permissive hypertension Consider gradually start Lopressor  Hyponatremia  Appears dehydrated,  Sodium 127 on presentation, improved to 133 after hydration, will continue hydration for another 24  hours  Noninsulin-dependent type 2 diabetes Home medication Metformin held since admission A.m. blood glucose 153 this morning Diet control for now Monitor blood glucose   FTT, progressive decline since September 2020 per daughter, Dr. Lynnda Child like to talk with palliative care  DVT Prophylaxis: Lovenox subcu  Code Status: Full  Family Communication: Daughter at bedside  Disposition Plan:    Patient came from:                                 home                                                                         Anticipated d/c place: pending pt/ot/speech eval   Barriers to d/c OR conditions which need to be met to effect a safe d/c:   Need therapy eval, need palliative care consult  Consultants:  Neurology  Palliative care  Procedures:  None  Antibiotics:  None   Objective: BP (!) 141/58 (BP Location: Right Arm)   Pulse 88   Temp 98.2 F (36.8 C) (Oral)   Resp 17   Ht 5' (1.524 m)   Wt 51.2 kg   SpO2 99%   BMI 22.04 kg/m   Intake/Output Summary (Last 24 hours) at 10/28/2019 1347 Last data filed at 10/28/2019 0420 Gross per 24 hour  Intake --  Output 1400 ml  Net -1400  ml   Filed Weights   10/27/19 1115 10/27/19 2123  Weight: 53.8 kg 51.2 kg    Exam: Patient is examined daily including today on 10/28/2019, exams remain the same as of yesterday except that has changed    General:  NAD, calm and pleasant   Cardiovascular: RRR  Respiratory: CTABL  Abdomen: Soft/ND/NT, positive BS  Musculoskeletal: No Edema, chronic venous stasis changes left lower leg  Neuro: alert, oriented to person /place, she is oriented to the year but not oriented to the month  Data Reviewed: Basic Metabolic Panel: Recent Labs  Lab 10/27/19 1138  NA 133*  K 3.4*  CL 99  CO2 20*  GLUCOSE 153*  BUN 6*  CREATININE 0.52  CALCIUM 8.2*   Liver Function Tests: Recent Labs  Lab 10/27/19 1138  AST 18  ALT 12  ALKPHOS 49  BILITOT 0.4  PROT 5.5*  ALBUMIN  3.0*   No results for input(s): LIPASE, AMYLASE in the last 168 hours. No results for input(s): AMMONIA in the last 168 hours. CBC: Recent Labs  Lab 10/27/19 1138  WBC 8.1  HGB 11.8*  HCT 36.0  MCV 91.1  PLT 194   Cardiac Enzymes:   No results for input(s): CKTOTAL, CKMB, CKMBINDEX, TROPONINI in the last 168 hours. BNP (last 3 results) No results for input(s): BNP in the last 8760 hours.  ProBNP (last 3 results) No results for input(s): PROBNP in the last 8760 hours.  CBG: Recent Labs  Lab 10/27/19 1107 10/28/19 0618  GLUCAP 149* 122*    Recent Results (from the past 240 hour(s))  Respiratory Panel by RT PCR (Flu A&B, Covid) - Nasopharyngeal Swab     Status: None   Collection Time: 10/28/19  1:55 AM   Specimen: Nasopharyngeal Swab  Result Value Ref Range Status   SARS Coronavirus 2 by RT PCR NEGATIVE NEGATIVE Final    Comment: (NOTE) SARS-CoV-2 target nucleic acids are NOT DETECTED. The SARS-CoV-2 RNA is generally detectable in upper respiratoy specimens during the acute phase of infection. The lowest concentration of SARS-CoV-2 viral copies this assay can detect is 131 copies/mL. A negative result does not preclude SARS-Cov-2 infection and should not be used as the sole basis for treatment or other patient management decisions. A negative result may occur with  improper specimen collection/handling, submission of specimen other than nasopharyngeal swab, presence of viral mutation(s) within the areas targeted by this assay, and inadequate number of viral copies (<131 copies/mL). A negative result must be combined with clinical observations, patient history, and epidemiological information. The expected result is Negative. Fact Sheet for Patients:  PinkCheek.be Fact Sheet for Healthcare Providers:  GravelBags.it This test is not yet ap proved or cleared by the [place] FDA and  has been authorized for  detection and/or diagnosis of SARS-CoV-2 by FDA under an Emergency Use Authorization (EUA). This EUA will remain  in effect (meaning this test can be used) for the duration of the COVID-19 declaration under Section 564(b)(1) of the Act, 21 U.S.C. section 360bbb-3(b)(1), unless the authorization is terminated or revoked sooner.    Influenza A by PCR NEGATIVE NEGATIVE Final   Influenza B by PCR NEGATIVE NEGATIVE Final    Comment: (NOTE) The Xpert Xpress SARS-CoV-2/FLU/RSV assay is intended as an aid in  the diagnosis of influenza from Nasopharyngeal swab specimens and  should not be used as a sole basis for treatment. Nasal washings and  aspirates are unacceptable for Xpert Xpress SARS-CoV-2/FLU/RSV  testing. Fact Sheet for  Patients: PinkCheek.be Fact Sheet for Healthcare Providers: GravelBags.it This test is not yet approved or cleared by the [place] FDA and  has been authorized for detection and/or diagnosis of SARS-CoV-2 by  FDA under an Emergency Use Authorization (EUA). This EUA will remain  in effect (meaning this test can be used) for the duration of the  Covid-19 declaration under Section 564(b)(1) of the Act, 21  U.S.C. section 360bbb-3(b)(1), unless the authorization is  terminated or revoked. Performed at [facility], [place] [address], [place], [place] [address]      Studies: CT ANGIO HEAD W OR WO CONTRAST  Result Date: 10/27/2019 CLINICAL DATA:  Stroke follow-up EXAM: CT ANGIOGRAPHY HEAD AND NECK TECHNIQUE: Multidetector CT imaging of the head and neck was performed using the standard protocol during bolus administration of intravenous contrast. Multiplanar CT image reconstructions and MIPs were obtained to evaluate the vascular anatomy. Carotid stenosis measurements (when applicable) are obtained utilizing NASCET criteria, using the distal internal carotid diameter as the denominator. CONTRAST:  51m  OMNIPAQUE IOHEXOL 350 MG/ML SOLN COMPARISON:  None. FINDINGS: CTA NECK FINDINGS SKELETON: There is no bony spinal canal stenosis. No lytic or blastic lesion. OTHER NECK: Normal pharynx, larynx and major salivary glands. No cervical lymphadenopathy. Unremarkable thyroid gland. UPPER CHEST: No pneumothorax or pleural effusion. No nodules or masses. AORTIC ARCH: There is mild calcific atherosclerosis of the aortic arch. There is no aneurysm, dissection or hemodynamically significant stenosis of the visualized portion of the aorta. Conventional 3 vessel aortic branching pattern. The visualized proximal subclavian arteries are widely patent. RIGHT CAROTID SYSTEM: No dissection, occlusion or aneurysm. Mild atherosclerotic calcification at the carotid bifurcation without hemodynamically significant stenosis. LEFT CAROTID SYSTEM: Normal without aneurysm, dissection or stenosis. VERTEBRAL ARTERIES: Codominant configuration. Moderate atherosclerosis of the left V1 segment. The right origin is normal aside from mild atherosclerotic calcification. CTA HEAD FINDINGS POSTERIOR CIRCULATION: --Vertebral arteries: Mild bilateral atherosclerotic calcification without hemodynamically significant stenosis. --Posterior inferior cerebellar arteries (PICA): Patent origins from the vertebral arteries. --Anterior inferior cerebellar arteries (AICA): Patent origins from the basilar artery. --Basilar artery: Moderate narrowing of the distal basilar artery. --Superior cerebellar arteries: Normal. --Posterior cerebral arteries: Normal. The right PCA is partially supplied by a posterior communicating artery (p-comm). ANTERIOR CIRCULATION: --Intracranial internal carotid arteries: Atherosclerotic calcification of the internal carotid arteries at the skull base without hemodynamically significant stenosis. 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 There is multifocal mild-to-moderate narrowing of the A2  segments. --Middle cerebral arteries (MCA): No occlusion. Multifocal mild-to-moderate narrowing of both middle cerebral arteries, left-greater-than-right. VENOUS SINUSES: As permitted by contrast timing, patent. ANATOMIC VARIANTS: None Review of the MIP images confirms the above findings. IMPRESSION: 1. No intracranial arterial occlusion or high-grade stenosis. 2. Moderate narrowing of the distal basilar artery. 3. Multifocal mild-to-moderate narrowing of the anterior and middle cerebral arteries. 4. Moderate stenosis of the left vertebral artery V1 segment. 5. Aortic Atherosclerosis (ICD10-I70.0). Electronically Signed   By: KUlyses JarredM.D.   On: 10/27/2019 22:38   CT ANGIO NECK W OR WO CONTRAST  Result Date: 10/27/2019 CLINICAL DATA:  Stroke follow-up EXAM: CT ANGIOGRAPHY HEAD AND NECK TECHNIQUE: Multidetector CT imaging of the head and neck was performed using the standard protocol during bolus administration of intravenous contrast. Multiplanar CT image reconstructions and MIPs were obtained to evaluate the vascular anatomy. Carotid stenosis measurements (when applicable) are obtained utilizing NASCET criteria, using the distal internal carotid diameter as the denominator. CONTRAST:  76mOMNIPAQUE  IOHEXOL 350 MG/ML SOLN COMPARISON:  None. FINDINGS: CTA NECK FINDINGS SKELETON: There is no bony spinal canal stenosis. No lytic or blastic lesion. OTHER NECK: Normal pharynx, larynx and major salivary glands. No cervical lymphadenopathy. Unremarkable thyroid gland. UPPER CHEST: No pneumothorax or pleural effusion. No nodules or masses. AORTIC ARCH: There is mild calcific atherosclerosis of the aortic arch. There is no aneurysm, dissection or hemodynamically significant stenosis of the visualized portion of the aorta. Conventional 3 vessel aortic branching pattern. The visualized proximal subclavian arteries are widely patent. RIGHT CAROTID SYSTEM: No dissection, occlusion or aneurysm. Mild atherosclerotic  calcification at the carotid bifurcation without hemodynamically significant stenosis. LEFT CAROTID SYSTEM: Normal without aneurysm, dissection or stenosis. VERTEBRAL ARTERIES: Codominant configuration. Moderate atherosclerosis of the left V1 segment. The right origin is normal aside from mild atherosclerotic calcification. CTA HEAD FINDINGS POSTERIOR CIRCULATION: --Vertebral arteries: Mild bilateral atherosclerotic calcification without hemodynamically significant stenosis. --Posterior inferior cerebellar arteries (PICA): Patent origins from the vertebral arteries. --Anterior inferior cerebellar arteries (AICA): Patent origins from the basilar artery. --Basilar artery: Moderate narrowing of the distal basilar artery. --Superior cerebellar arteries: Normal. --Posterior cerebral arteries: Normal. The right PCA is partially supplied by a posterior communicating artery (p-comm). ANTERIOR CIRCULATION: --Intracranial internal carotid arteries: Atherosclerotic calcification of the internal carotid arteries at the skull base without hemodynamically significant stenosis. 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 There is multifocal mild-to-moderate narrowing of the A2 segments. --Middle cerebral arteries (MCA): No occlusion. Multifocal mild-to-moderate narrowing of both middle cerebral arteries, left-greater-than-right. VENOUS SINUSES: As permitted by contrast timing, patent. ANATOMIC VARIANTS: None Review of the MIP images confirms the above findings. IMPRESSION: 1. No intracranial arterial occlusion or high-grade stenosis. 2. Moderate narrowing of the distal basilar artery. 3. Multifocal mild-to-moderate narrowing of the anterior and middle cerebral arteries. 4. Moderate stenosis of the left vertebral artery V1 segment. 5. Aortic Atherosclerosis (ICD10-I70.0). Electronically Signed   By: Ulyses Jarred M.D.   On: 10/27/2019 22:38   MR Brain Wo Contrast (neuro protocol)  Result  Date: 10/27/2019 CLINICAL DATA:  Focal neuro deficit, greater than 6 hours, stroke suspected. Altered mental status, unclear cause. EXAM: MRI HEAD WITHOUT CONTRAST TECHNIQUE: Multiplanar, multiecho pulse sequences of the brain and surrounding structures were obtained without intravenous contrast. COMPARISON:  Noncontrast head CT performed earlier the same day 10/27/2019, brain MRI 07/31/2019 FINDINGS: Brain: Multiple sequences are significantly motion degraded, limiting evaluation. Most notably there is moderate/severe motion degradation of the axial T2* sequence, moderate/severe motion degradation of the axial T1 weighted sequence, severe motion degradation of the axial T2 FLAIR sequence, severe motion degradation of the axial T2 weighted sequence, severe motion degradation of the coronal T2 weighted sequence and severe motion degradation of the sagittal T1 weighted sequence. There is an 8 mm focus of restricted diffusion consistent with acute infarct within the medial left temporal occipital cortex (series 3, image 20). No acute infarct is identified elsewhere within the brain. Redemonstrated moderate chronic small vessel ischemic changes within the cerebral white matter. Stable appearance of less prominent chronic small-vessel ischemic changes within the deep gray nuclei and pons in the limitations of motion degradation. No evidence of intracranial mass is identified. No chronic intracranial blood products identified on motion degraded imaging. No extra-axial fluid collection is identified. No midline shift. Vascular: Expected proximal arterial flow voids. Skull and upper cervical spine: No focal marrow lesion within the limitations of significant motion degradation. Sinuses/Orbits: Visualized orbits show no acute finding. No significant paranasal sinus disease or  mastoid effusion at the imaged levels. IMPRESSION: 1. Significantly motion degraded and limited examination as described. 2. 8 mm acute cortical  infarct within the medial left temporal occipital lobes. 3. Redemonstrated moderate chronic small vessel ischemic changes within the cerebral white matter. Less severe chronic ischemic changes are also again demonstrated within the deep gray nuclei and pons. These findings are stable as compared MRI 07/31/2019 within the limitations of significant motion degradation. Electronically Signed   By: Kellie Simmering DO   On: 10/27/2019 17:12   EEG adult  Result Date: 10/28/2019 Lora Havens, MD     10/28/2019 10:27 AM Patient Name: Bindi Klomp MRN: [id number] Epilepsy Attending: Lora Havens Referring Physician/Provider: Dr. Kathrynn Speed Date: 10/28/2019 Duration: 24.34 minutes Patient history: 86 year old female with recurrent episodes of unclear etiology, most recent Truman [place]ward presented with right-sided weakness with aphasia.  EEG to evaluate for seizures. Level of alertness: Awake AEDs during EEG study: None Technical aspects: This EEG study was done with scalp electrodes positioned according to the 10-20 International system of electrode placement. Electrical activity was acquired at a sampling rate of 500Hz  and reviewed with a high frequency filter of 70Hz  and a low frequency filter of 1Hz . EEG data were recorded continuously and digitally stored. Description:: The posterior dominant rhythm consists of 8-9 Hz activity of moderate voltage (25-35 uV) seen predominantly in posterior head regions, symmetric and reactive to eye opening and eye closing. Intermittent left temporal 2 to 3 Hz delta slowing was also noted. Hyperventilation and photic stimulation were not performed. Abnormality -Intermittent slow, left temporal region IMPRESSION: This study is suggestive of nonspecific cortical dysfunction in left temporal region.  No seizures or epileptiform discharges were seen throughout the recording. Lora Havens   ECHOCARDIOGRAM COMPLETE  Result Date: 10/28/2019    ECHOCARDIOGRAM REPORT   Patient  Name:   Clarita Leber Date of Exam: 10/28/2019 Medical Rec #:  [id number]          Height:       60.0 in Accession #:    [id number]         Weight:       112.9 lb Date of Birth:  Jun 04, 1927          BSA:          1.464 m Patient Age:    40 years           BP:           168/72 mmHg Patient Gender: F                  HR:           89 bpm. Exam Location:  Inpatient Procedure: 2D Echo, Cardiac Doppler and Color Doppler Indications:    TIA 435.9/G45.9  History:        Patient has no prior history of Echocardiogram examinations.                 Risk Factors:Hypertension and Diabetes.  Sonographer:    Clayton Lefort RDCS (AE) Referring Phys: [id number] Lequita Halt  Sonographer Comments: Suboptimal parasternal window. IMPRESSIONS  1. Normal LV systolic function; grade 1 diastolic dysfunction; mild proximal septal thickening; mild MR.  2. Left ventricular ejection fraction, by estimation, is 60 to 65%. The left ventricle has normal function. The left ventricle has no regional wall motion abnormalities. There is mild left ventricular hypertrophy of the basal segment. Left ventricular diastolic parameters are consistent with Grade I  diastolic dysfunction (impaired relaxation).  3. Right ventricular systolic function is normal. The right ventricular size is normal. There is normal pulmonary artery systolic pressure.  4. The mitral valve is normal in structure. Mild mitral valve regurgitation. No evidence of mitral stenosis.  5. The aortic valve was not well visualized. Aortic valve regurgitation is not visualized. No aortic stenosis is present. FINDINGS  Left Ventricle: Left ventricular ejection fraction, by estimation, is 60 to 65%. The left ventricle has normal function. The left ventricle has no regional wall motion abnormalities. The left ventricular internal cavity size was normal in size. There is  mild left ventricular hypertrophy of the basal segment. Left ventricular diastolic parameters are consistent with Grade I  diastolic dysfunction (impaired relaxation). Right Ventricle: The right ventricular size is normal.Right ventricular systolic function is normal. There is normal pulmonary artery systolic pressure. The tricuspid regurgitant velocity is 2.63 m/s, and with an assumed right atrial pressure of 3 mmHg, the estimated right ventricular systolic pressure is 09.9 mmHg. Left Atrium: Left atrial size was normal in size. Right Atrium: Right atrial size was normal in size. Pericardium: There is no evidence of pericardial effusion. Mitral Valve: The mitral valve is normal in structure. Normal mobility of the mitral valve leaflets. Mild mitral annular calcification. Mild mitral valve regurgitation. No evidence of mitral valve stenosis. MV peak gradient, 3.0 mmHg. The mean mitral valve gradient is 1.0 mmHg. Tricuspid Valve: The tricuspid valve is normal in structure. Tricuspid valve regurgitation is trivial. No evidence of tricuspid stenosis. Aortic Valve: The aortic valve was not well visualized. Aortic valve regurgitation is not visualized. No aortic stenosis is present. Aortic valve mean gradient measures 3.7 mmHg. Aortic valve peak gradient measures 6.7 mmHg. Aortic valve area, by VTI measures 1.97 cm. Pulmonic Valve: The pulmonic valve was not well visualized. Pulmonic valve regurgitation is not visualized. No evidence of pulmonic stenosis. Aorta: The aortic root is normal in size and structure. Venous: The inferior vena cava was not well visualized.  Additional Comments: Normal LV systolic function; grade 1 diastolic dysfunction; mild proximal septal thickening; mild MR.  LEFT VENTRICLE PLAX 2D LVIDd:         3.02 cm  Diastology LVIDs:         1.96 cm  LV e' lateral:   7.18 cm/s LV PW:         1.09 cm  LV E/e' lateral: 10.4 LV IVS:        1.30 cm  LV e' medial:    5.87 cm/s LVOT diam:     1.80 cm  LV E/e' medial:  12.7 LV SV:         55 LV SV Index:   37 LVOT Area:     2.54 cm  RIGHT VENTRICLE             IVC RV Basal diam:   2.44 cm     IVC diam: 0.94 cm RV S prime:     13.20 cm/s TAPSE (M-mode): 1.5 cm LEFT ATRIUM             Index       RIGHT ATRIUM          Index LA diam:        3.00 cm 2.05 cm/m  RA Area:     9.29 cm LA Vol (A2C):   54.9 ml 37.51 ml/m RA Volume:   17.90 ml 12.23 ml/m LA Vol (A4C):   36.1 ml 24.66 ml/m LA Biplane Vol:  48.5 ml 33.13 ml/m  AORTIC VALVE AV Area (Vmax):    2.04 cm AV Area (Vmean):   1.76 cm AV Area (VTI):     1.97 cm AV Vmax:           129.67 cm/s AV Vmean:          91.133 cm/s AV VTI:            0.278 m AV Peak Grad:      6.7 mmHg AV Mean Grad:      3.7 mmHg LVOT Vmax:         104.00 cm/s LVOT Vmean:        63.100 cm/s LVOT VTI:          0.215 m LVOT/AV VTI ratio: 0.77  AORTA Ao Root diam: 3.00 cm MITRAL VALVE               TRICUSPID VALVE MV Area (PHT): 2.62 cm    TR Peak grad:   27.7 mmHg MV Peak grad:  3.0 mmHg    TR Vmax:        263.00 cm/s MV Mean grad:  1.0 mmHg MV Vmax:       0.87 m/s    SHUNTS MV Vmean:      55.8 cm/s   Systemic VTI:  0.22 m MV Decel Time: 290 msec    Systemic Diam: 1.80 cm MV E velocity: 74.60 cm/s MV A velocity: 86.10 cm/s MV E/A ratio:  0.87 Kirk Ruths MD Electronically signed by Kirk Ruths MD Signature Date/Time: 10/28/2019/11:52:22 AM    Final     Scheduled Meds: . aspirin EC  81 mg Oral Daily  . atorvastatin  40 mg Oral Daily  . cholecalciferol  1,000 Units Oral QPC supper  . docusate sodium  200 mg Oral QPM  . enoxaparin (LOVENOX) injection  40 mg Subcutaneous Q24H  . latanoprost  1 drop Both Eyes QHS  . multivitamin  1 tablet Oral BID  . sodium chloride flush  3 mL Intravenous Once  . vitamin B-12  500 mcg Oral Daily    Continuous Infusions: . sodium chloride 75 mL/hr at 10/28/19 1336     Time spent: 65mns I have personally reviewed and interpreted on  10/28/2019 daily labs, tele strips, imagings as discussed above under date review session and assessment and plans.  I reviewed all nursing notes, pharmacy notes, consultant notes,   vitals, pertinent old records  I have discussed plan of care as described above with RN , patient and family on 10/28/2019   FFlorencia ReasonsMD, PhD, FACP  [facility]  Available via Epic secure chat 7am-7pm for nonurgent issues Please page for urgent issues, pager number available through [email or web address] .   10/28/2019, 1:47 PM  LOS: 1 day

## 2019-10-28 NOTE — ED Provider Notes (Signed)
[facility] Provider Note   CSN: [id number] Arrival date & time: 10/27/19  1047     History Chief Complaint  Patient presents with  . Altered Mental Status    Suzanne Cantu is a 86 y.o. female.  HPI Patient presents to the emergency department with right-sided weakness that the son noted this morning.  The patient attempted to get up to go the bathroom and fell.  The patient then was placed back in bed and when she attempted to get up again she was weak on the right side.  The patient son states that she was unable to follow commands.  The patient is not able to follow my commands and give me any history here in the emergency department.  The patient has had multiple TIAs in the past.  Past Medical History:  Diagnosis Date  . Diabetes mellitus without complication (HCC)   . Hypertension     Patient Active Problem List   Diagnosis Date Noted  . CVA (cerebral vascular accident) (HCC) 10/27/2019  . Encephalopathy acute 07/31/2019  . Hypertensive urgency 07/31/2019  . Essential hypertension 07/31/2019  . Controlled type 2 diabetes mellitus without complication, without long-term current use of insulin (HCC) 07/31/2019  . Chronic back pain 07/31/2019  . Hypertensive emergency 07/31/2019  . Acute encephalopathy 07/31/2019    Past Surgical History:  Procedure Laterality Date  . ABDOMINAL HYSTERECTOMY    . CESAREAN SECTION     4  . CHOLECYSTECTOMY    . KIDNEY STONE SURGERY       OB History   No obstetric history on file.     No family history on file.  Social History   Tobacco Use  . Smoking status: Never Smoker  . Smokeless tobacco: Never Used  Substance Use Topics  . Alcohol use: Never  . Drug use: Not on file    Home Medications Prior to Admission medications   Medication Sig Start Date End Date Taking? Authorizing Provider  aspirin EC 81 MG tablet Take 81 mg by mouth daily.   Yes [provider]  bimatoprost (LUMIGAN)  0.01 % SOLN Place 1 drop into both eyes at bedtime.   Yes [provider]  Cholecalciferol (VITAMIN D-3 PO) Take 1,000 mg by mouth daily after supper.    Yes [provider]  docusate sodium (COLACE) 250 MG capsule Take 250 mg by mouth daily.   Yes [provider]  metFORMIN (GLUCOPHAGE) 500 MG tablet Take 500 mg by mouth 2 (two) times daily with a meal.    Yes [provider]  Multiple Vitamins-Minerals (PRESERVISION AREDS 2) CAPS Take 1 capsule by mouth 2 (two) times daily.   Yes [provider]  valsartan-hydrochlorothiazide (DIOVAN-HCT) 80-12.5 MG tablet Take 1 tablet by mouth daily. Patient taking differently: Take 0.5 tablets by mouth daily.  08/02/19  Yes Myrtie NeitherUgah, Nwannadiya, MD  vitamin B-12 (CYANOCOBALAMIN) 500 MCG tablet Take 1 tablet (500 mcg total) by mouth daily. 08/02/19  Yes Myrtie NeitherUgah, Nwannadiya, MD  valproic acid (DEPAKENE) 250 MG capsule Take 2 capsules (500 mg total) by mouth 2 (two) times daily. Patient not taking: Reported on 08/06/2019 08/02/19   Myrtie NeitherUgah, Nwannadiya, MD    Allergies    Valproic acid and related, Metaxalone, and Peanut-containing drug products  Review of Systems   Review of Systems Level 5 caveat applies due to altered mental status Physical Exam Updated Vital Signs BP 99/87 (BP Location: Left Arm)   Pulse 83   Temp  98.2 F (36.8 C) (Oral)   Resp 17   Ht 5' (1.524 m)   Wt 51.2 kg   SpO2 99%   BMI 22.04 kg/m   Physical Exam Vitals and nursing note reviewed.  Constitutional:      General: She is not in acute distress.    Appearance: She is well-developed.  HENT:     Head: Normocephalic and atraumatic.  Eyes:     Pupils: Pupils are equal, round, and reactive to light.  Cardiovascular:     Rate and Rhythm: Normal rate and regular rhythm.     Heart sounds: Normal heart sounds. No murmur. No friction rub. No gallop.   Pulmonary:     Effort: Pulmonary effort is normal. No respiratory distress.     Breath sounds:  Normal breath sounds. No wheezing.  Abdominal:     General: Bowel sounds are normal. There is no distension.     Palpations: Abdomen is soft.     Tenderness: There is no abdominal tenderness.  Musculoskeletal:     Cervical back: Neck supple.  Skin:    General: Skin is warm and dry.     Capillary Refill: Capillary refill takes less than 2 seconds.     Findings: No erythema or rash.  Neurological:     Mental Status: She is alert. She is disoriented.     Motor: No abnormal muscle tone.     Comments: Is unable to follow commands to perform neurological testing.  Psychiatric:        Behavior: Behavior normal.     ED Results / Procedures / Treatments   Labs (all labs ordered are listed, but only abnormal results are displayed) Labs Reviewed  COMPREHENSIVE METABOLIC PANEL - Abnormal; Notable for the following components:      Result Value   Sodium 133 (*)    Potassium 3.4 (*)    CO2 20 (*)    Glucose, Bld 153 (*)    BUN 6 (*)    Calcium 8.2 (*)    Total Protein 5.5 (*)    Albumin 3.0 (*)    All other components within normal limits  CBC - Abnormal; Notable for the following components:   Hemoglobin 11.8 (*)    All other components within normal limits  URINALYSIS, ROUTINE W REFLEX MICROSCOPIC - Abnormal; Notable for the following components:   APPearance CLOUDY (*)    Ketones, ur 5 (*)    All other components within normal limits  HEMOGLOBIN A1C - Abnormal; Notable for the following components:   Hgb A1c MFr Bld 6.5 (*)    All other components within normal limits  LIPID PANEL - Abnormal; Notable for the following components:   LDL Cholesterol 116 (*)    All other components within normal limits  GLUCOSE, CAPILLARY - Abnormal; Notable for the following components:   Glucose-Capillary 122 (*)    All other components within normal limits  CBG MONITORING, ED - Abnormal; Notable for the following components:   Glucose-Capillary 149 (*)    All other components within normal  limits  RESPIRATORY PANEL BY RT PCR (FLU A&B, COVID)  CBC WITH DIFFERENTIAL/PLATELET  BASIC METABOLIC PANEL  MAGNESIUM  CBG MONITORING, ED    EKG EKG Interpretation  Date/Time:  Monday October 27 2019 12:08:55 EDT Ventricular Rate:  94 PR Interval:    QRS Duration: 93 QT Interval:  347 QTC Calculation: 434 R Axis:   -48 Text Interpretation: Ectopic atrial rhythm Left anterior fascicular block Abnormal R-wave  progression, early transition Nonspecific T abnrm, anterolateral leads Baseline wander in lead(s) II III aVF No significant change since last tracing Confirmed by Gwyneth Sprout ([id number]) on 10/27/2019 12:37:22 PM   Radiology CT ANGIO HEAD W OR WO CONTRAST  Result Date: 10/27/2019 CLINICAL DATA:  Stroke follow-up EXAM: CT ANGIOGRAPHY HEAD AND NECK TECHNIQUE: Multidetector CT imaging of the head and neck was performed using the standard protocol during bolus administration of intravenous contrast. Multiplanar CT image reconstructions and MIPs were obtained to evaluate the vascular anatomy. Carotid stenosis measurements (when applicable) are obtained utilizing NASCET criteria, using the distal internal carotid diameter as the denominator. CONTRAST:  19mL OMNIPAQUE IOHEXOL 350 MG/ML SOLN COMPARISON:  None. FINDINGS: CTA NECK FINDINGS SKELETON: There is no bony spinal canal stenosis. No lytic or blastic lesion. OTHER NECK: Normal pharynx, larynx and major salivary glands. No cervical lymphadenopathy. Unremarkable thyroid gland. UPPER CHEST: No pneumothorax or pleural effusion. No nodules or masses. AORTIC ARCH: There is mild calcific atherosclerosis of the aortic arch. There is no aneurysm, dissection or hemodynamically significant stenosis of the visualized portion of the aorta. Conventional 3 vessel aortic branching pattern. The visualized proximal subclavian arteries are widely patent. RIGHT CAROTID SYSTEM: No dissection, occlusion or aneurysm. Mild atherosclerotic calcification at the  carotid bifurcation without hemodynamically significant stenosis. LEFT CAROTID SYSTEM: Normal without aneurysm, dissection or stenosis. VERTEBRAL ARTERIES: Codominant configuration. Moderate atherosclerosis of the left V1 segment. The right origin is normal aside from mild atherosclerotic calcification. CTA HEAD FINDINGS POSTERIOR CIRCULATION: --Vertebral arteries: Mild bilateral atherosclerotic calcification without hemodynamically significant stenosis. --Posterior inferior cerebellar arteries (PICA): Patent origins from the vertebral arteries. --Anterior inferior cerebellar arteries (AICA): Patent origins from the basilar artery. --Basilar artery: Moderate narrowing of the distal basilar artery. --Superior cerebellar arteries: Normal. --Posterior cerebral arteries: Normal. The right PCA is partially supplied by a posterior communicating artery (p-comm). ANTERIOR CIRCULATION: --Intracranial internal carotid arteries: Atherosclerotic calcification of the internal carotid arteries at the skull base without hemodynamically significant stenosis. 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 There is multifocal mild-to-moderate narrowing of the A2 segments. --Middle cerebral arteries (MCA): No occlusion. Multifocal mild-to-moderate narrowing of both middle cerebral arteries, left-greater-than-right. VENOUS SINUSES: As permitted by contrast timing, patent. ANATOMIC VARIANTS: None Review of the MIP images confirms the above findings. IMPRESSION: 1. No intracranial arterial occlusion or high-grade stenosis. 2. Moderate narrowing of the distal basilar artery. 3. Multifocal mild-to-moderate narrowing of the anterior and middle cerebral arteries. 4. Moderate stenosis of the left vertebral artery V1 segment. 5. Aortic Atherosclerosis (ICD10-I70.0). Electronically Signed   By: Deatra Robinson M.D.   On: 10/27/2019 22:38   DG Chest 2 View  Result Date: 10/27/2019 CLINICAL DATA:  Altered  mental status.  Hypertension. EXAM: CHEST - 2 VIEW COMPARISON:  August 06, 2019 FINDINGS: There is atelectatic change in the right base. The lungs elsewhere are clear. Heart size and pulmonary vascularity are normal. No adenopathy. There is aortic atherosclerosis. Bones are osteoporotic. IMPRESSION: Atelectasis right base. Lungs elsewhere clear. Stable cardiac silhouette. Aortic Atherosclerosis (ICD10-I70.0). Electronically Signed   By: Bretta Bang III M.D.   On: 10/27/2019 14:02   CT Head Wo Contrast  Result Date: 10/27/2019 CLINICAL DATA:  Altered mental status. Last seen normal last night. Found on the floor this morning. EXAM: CT HEAD WITHOUT CONTRAST TECHNIQUE: Contiguous axial images were obtained from the base of the skull through the vertex without intravenous contrast. COMPARISON:  07/31/2019 FINDINGS: Brain: Age related atrophy. Chronic small-vessel ischemic changes  of the cerebral hemispheric white matter. No sign of acute infarction, mass lesion, hemorrhage, hydrocephalus or extra-axial collection. Vascular: There is atherosclerotic calcification of the major vessels at the base of the brain. Skull: Negative Sinuses/Orbits: Clear/normal Other: None IMPRESSION: No acute finding by CT. Age related atrophy. Chronic small-vessel ischemic changes of the cerebral hemispheric white matter. Electronically Signed   By: Paulina Fusi M.D.   On: 10/27/2019 13:26   CT ANGIO NECK W OR WO CONTRAST  Result Date: 10/27/2019 CLINICAL DATA:  Stroke follow-up EXAM: CT ANGIOGRAPHY HEAD AND NECK TECHNIQUE: Multidetector CT imaging of the head and neck was performed using the standard protocol during bolus administration of intravenous contrast. Multiplanar CT image reconstructions and MIPs were obtained to evaluate the vascular anatomy. Carotid stenosis measurements (when applicable) are obtained utilizing NASCET criteria, using the distal internal carotid diameter as the denominator. CONTRAST:  75mL OMNIPAQUE  IOHEXOL 350 MG/ML SOLN COMPARISON:  None. FINDINGS: CTA NECK FINDINGS SKELETON: There is no bony spinal canal stenosis. No lytic or blastic lesion. OTHER NECK: Normal pharynx, larynx and major salivary glands. No cervical lymphadenopathy. Unremarkable thyroid gland. UPPER CHEST: No pneumothorax or pleural effusion. No nodules or masses. AORTIC ARCH: There is mild calcific atherosclerosis of the aortic arch. There is no aneurysm, dissection or hemodynamically significant stenosis of the visualized portion of the aorta. Conventional 3 vessel aortic branching pattern. The visualized proximal subclavian arteries are widely patent. RIGHT CAROTID SYSTEM: No dissection, occlusion or aneurysm. Mild atherosclerotic calcification at the carotid bifurcation without hemodynamically significant stenosis. LEFT CAROTID SYSTEM: Normal without aneurysm, dissection or stenosis. VERTEBRAL ARTERIES: Codominant configuration. Moderate atherosclerosis of the left V1 segment. The right origin is normal aside from mild atherosclerotic calcification. CTA HEAD FINDINGS POSTERIOR CIRCULATION: --Vertebral arteries: Mild bilateral atherosclerotic calcification without hemodynamically significant stenosis. --Posterior inferior cerebellar arteries (PICA): Patent origins from the vertebral arteries. --Anterior inferior cerebellar arteries (AICA): Patent origins from the basilar artery. --Basilar artery: Moderate narrowing of the distal basilar artery. --Superior cerebellar arteries: Normal. --Posterior cerebral arteries: Normal. The right PCA is partially supplied by a posterior communicating artery (p-comm). ANTERIOR CIRCULATION: --Intracranial internal carotid arteries: Atherosclerotic calcification of the internal carotid arteries at the skull base without hemodynamically significant stenosis. 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 There is multifocal mild-to-moderate narrowing of the A2 segments.  --Middle cerebral arteries (MCA): No occlusion. Multifocal mild-to-moderate narrowing of both middle cerebral arteries, left-greater-than-right. VENOUS SINUSES: As permitted by contrast timing, patent. ANATOMIC VARIANTS: None Review of the MIP images confirms the above findings. IMPRESSION: 1. No intracranial arterial occlusion or high-grade stenosis. 2. Moderate narrowing of the distal basilar artery. 3. Multifocal mild-to-moderate narrowing of the anterior and middle cerebral arteries. 4. Moderate stenosis of the left vertebral artery V1 segment. 5. Aortic Atherosclerosis (ICD10-I70.0). Electronically Signed   By: Deatra Robinson M.D.   On: 10/27/2019 22:38   MR Brain Wo Contrast (neuro protocol)  Result Date: 10/27/2019 CLINICAL DATA:  Focal neuro deficit, greater than 6 hours, stroke suspected. Altered mental status, unclear cause. EXAM: MRI HEAD WITHOUT CONTRAST TECHNIQUE: Multiplanar, multiecho pulse sequences of the brain and surrounding structures were obtained without intravenous contrast. COMPARISON:  Noncontrast head CT performed earlier the same day 10/27/2019, brain MRI 07/31/2019 FINDINGS: Brain: Multiple sequences are significantly motion degraded, limiting evaluation. Most notably there is moderate/severe motion degradation of the axial T2* sequence, moderate/severe motion degradation of the axial T1 weighted sequence, severe motion degradation of the axial T2 FLAIR sequence, severe motion degradation  of the axial T2 weighted sequence, severe motion degradation of the coronal T2 weighted sequence and severe motion degradation of the sagittal T1 weighted sequence. There is an 8 mm focus of restricted diffusion consistent with acute infarct within the medial left temporal occipital cortex (series 3, image 20). No acute infarct is identified elsewhere within the brain. Redemonstrated moderate chronic small vessel ischemic changes within the cerebral white matter. Stable appearance of less prominent  chronic small-vessel ischemic changes within the deep gray nuclei and pons in the limitations of motion degradation. No evidence of intracranial mass is identified. No chronic intracranial blood products identified on motion degraded imaging. No extra-axial fluid collection is identified. No midline shift. Vascular: Expected proximal arterial flow voids. Skull and upper cervical spine: No focal marrow lesion within the limitations of significant motion degradation. Sinuses/Orbits: Visualized orbits show no acute finding. No significant paranasal sinus disease or mastoid effusion at the imaged levels. IMPRESSION: 1. Significantly motion degraded and limited examination as described. 2. 8 mm acute cortical infarct within the medial left temporal occipital lobes. 3. Redemonstrated moderate chronic small vessel ischemic changes within the cerebral white matter. Less severe chronic ischemic changes are also again demonstrated within the deep gray nuclei and pons. These findings are stable as compared MRI 07/31/2019 within the limitations of significant motion degradation. Electronically Signed   By: Jackey Loge DO   On: 10/27/2019 17:12   EEG adult  Result Date: 10/28/2019 Charlsie Quest, MD     10/28/2019 10:27 AM Patient Name: Suzanne Cantu MRN: [id number] Epilepsy Attending: Charlsie Quest Referring Physician/Provider: Dr. Onalee Hua Date: 10/28/2019 Duration: 24.34 minutes Patient history: 86 year old female with recurrent episodes of unclear etiology, most recent Nedra Hai presented with right-sided weakness with aphasia.  EEG to evaluate for seizures. Level of alertness: Awake AEDs during EEG study: None Technical aspects: This EEG study was done with scalp electrodes positioned according to the 10-20 International system of electrode placement. Electrical activity was acquired at a sampling rate of 500Hz  and reviewed with a high frequency filter of 70Hz  and a low frequency filter of 1Hz . EEG data  were recorded continuously and digitally stored. Description:: The posterior dominant rhythm consists of 8-9 Hz activity of moderate voltage (25-35 uV) seen predominantly in posterior head regions, symmetric and reactive to eye opening and eye closing. Intermittent left temporal 2 to 3 Hz delta slowing was also noted. Hyperventilation and photic stimulation were not performed. Abnormality -Intermittent slow, left temporal region IMPRESSION: This study is suggestive of nonspecific cortical dysfunction in left temporal region.  No seizures or epileptiform discharges were seen throughout the recording.   ECHOCARDIOGRAM COMPLETE  Result Date: 10/28/2019    ECHOCARDIOGRAM REPORT   Patient Name:   Suzanne Cantu Date of Exam: 10/28/2019 Medical Rec #:  10/30/2019          Height:       60.0 in Accession #:    Sharyn Creamer         Weight:       112.9 lb Date of Birth:  [birth year]          BSA:          1.464 m Patient Age:    86 years           BP:           168/72 mmHg Patient Gender: F  HR:           89 bpm. Exam Location:  Inpatient Procedure: 2D Echo, Cardiac Doppler and Color Doppler Indications:    TIA 435.9/G45.9  History:        Patient has no prior history of Echocardiogram examinations.                 Risk Factors:Hypertension and Diabetes.  Sonographer:    Ross Ludwig RDCS (AE) Referring Phys: [id number] [facility]  Sonographer Comments: Suboptimal parasternal window. IMPRESSIONS  1. Normal LV systolic function; grade 1 diastolic dysfunction; mild proximal septal thickening; mild MR.  2. Left ventricular ejection fraction, by estimation, is 60 to 65%. The left ventricle has normal function. The left ventricle has no regional wall motion abnormalities. There is mild left ventricular hypertrophy of the basal segment. Left ventricular diastolic parameters are consistent with Grade I diastolic dysfunction (impaired relaxation).  3. Right ventricular systolic function is normal.  The right ventricular size is normal. There is normal pulmonary artery systolic pressure.  4. The mitral valve is normal in structure. Mild mitral valve regurgitation. No evidence of mitral stenosis.  5. The aortic valve was not well visualized. Aortic valve regurgitation is not visualized. No aortic stenosis is present. FINDINGS  Left Ventricle: Left ventricular ejection fraction, by estimation, is 60 to 65%. The left ventricle has normal function. The left ventricle has no regional wall motion abnormalities. The left ventricular internal cavity size was normal in size. There is  mild left ventricular hypertrophy of the basal segment. Left ventricular diastolic parameters are consistent with Grade I diastolic dysfunction (impaired relaxation). Right Ventricle: The right ventricular size is normal.Right ventricular systolic function is normal. There is normal pulmonary artery systolic pressure. The tricuspid regurgitant velocity is 2.63 m/s, and with an assumed right atrial pressure of 3 mmHg, the estimated right ventricular systolic pressure is 30.7 mmHg. Left Atrium: Left atrial size was normal in size. Right Atrium: Right atrial size was normal in size. Pericardium: There is no evidence of pericardial effusion. Mitral Valve: The mitral valve is normal in structure. Normal mobility of the mitral valve leaflets. Mild mitral annular calcification. Mild mitral valve regurgitation. No evidence of mitral valve stenosis. MV peak gradient, 3.0 mmHg. The mean mitral valve gradient is 1.0 mmHg. Tricuspid Valve: The tricuspid valve is normal in structure. Tricuspid valve regurgitation is trivial. No evidence of tricuspid stenosis. Aortic Valve: The aortic valve was not well visualized. Aortic valve regurgitation is not visualized. No aortic stenosis is present. Aortic valve mean gradient measures 3.7 mmHg. Aortic valve peak gradient measures 6.7 mmHg. Aortic valve area, by VTI measures 1.97 cm. Pulmonic Valve: The pulmonic  valve was not well visualized. Pulmonic valve regurgitation is not visualized. No evidence of pulmonic stenosis. Aorta: The aortic root is normal in size and structure. Venous: The inferior vena cava was not well visualized.  Additional Comments: Normal LV systolic function; grade 1 diastolic dysfunction; mild proximal septal thickening; mild MR.  LEFT VENTRICLE PLAX 2D LVIDd:         3.02 cm  Diastology LVIDs:         1.96 cm  LV e' lateral:   7.18 cm/s LV PW:         1.09 cm  LV E/e' lateral: 10.4 LV IVS:        1.30 cm  LV e' medial:    5.87 cm/s LVOT diam:     1.80 cm  LV E/e' medial:  12.7  LV SV:         55 LV SV Index:   37 LVOT Area:     2.54 cm  RIGHT VENTRICLE             IVC RV Basal diam:  2.44 cm     IVC diam: 0.94 cm RV S prime:     13.20 cm/s TAPSE (M-mode): 1.5 cm LEFT ATRIUM             Index       RIGHT ATRIUM          Index LA diam:        3.00 cm 2.05 cm/m  RA Area:     9.29 cm LA Vol (A2C):   54.9 ml 37.51 ml/m RA Volume:   17.90 ml 12.23 ml/m LA Vol (A4C):   36.1 ml 24.66 ml/m LA Biplane Vol: 48.5 ml 33.13 ml/m  AORTIC VALVE AV Area (Vmax):    2.04 cm AV Area (Vmean):   1.76 cm AV Area (VTI):     1.97 cm AV Vmax:           129.67 cm/s AV Vmean:          91.133 cm/s AV VTI:            0.278 m AV Peak Grad:      6.7 mmHg AV Mean Grad:      3.7 mmHg LVOT Vmax:         104.00 cm/s LVOT Vmean:        63.100 cm/s LVOT VTI:          0.215 m LVOT/AV VTI ratio: 0.77  AORTA Ao Root diam: 3.00 cm MITRAL VALVE               TRICUSPID VALVE MV Area (PHT): 2.62 cm    TR Peak grad:   27.7 mmHg MV Peak grad:  3.0 mmHg    TR Vmax:        263.00 cm/s MV Mean grad:  1.0 mmHg MV Vmax:       0.87 m/s    SHUNTS MV Vmean:      55.8 cm/s   Systemic VTI:  0.22 m MV Decel Time: 290 msec    Systemic Diam: 1.80 cm MV E velocity: 74.60 cm/s MV A velocity: 86.10 cm/s MV E/A ratio:  0.87 Olga Millers MD Electronically signed by Olga Millers MD Signature Date/Time: 10/28/2019/11:52:22 AM    Final      Procedures Procedures (including critical care time)  Medications Ordered in ED Medications  sodium chloride flush (NS) 0.9 % injection 3 mL (3 mLs Intravenous Not Given 10/27/19 1827)  aspirin EC tablet 81 mg (81 mg Oral Given 10/28/19 0914)  docusate sodium (COLACE) capsule 200 mg (200 mg Oral Given 10/28/19 1851)  vitamin B-12 (CYANOCOBALAMIN) tablet 500 mcg (500 mcg Oral Given 10/28/19 0914)  latanoprost (XALATAN) 0.005 % ophthalmic solution 1 drop (1 drop Both Eyes Given 10/28/19 2204)  0.9 %  sodium chloride infusion ( Intravenous Stopped 10/28/19 2204)  acetaminophen (TYLENOL) tablet 650 mg (has no administration in time range)    Or  acetaminophen (TYLENOL) 160 MG/5ML solution 650 mg (has no administration in time range)    Or  acetaminophen (TYLENOL) suppository 650 mg (has no administration in time range)  enoxaparin (LOVENOX) injection 40 mg (40 mg Subcutaneous Given 10/28/19 2204)  atorvastatin (LIPITOR) tablet 40 mg (40 mg Oral Given 10/28/19 0915)  hydrALAZINE (APRESOLINE) tablet 25  mg (has no administration in time range)  cholecalciferol (VITAMIN D3) tablet 1,000 Units (1,000 Units Oral Given 10/28/19 2216)  multivitamin (PROSIGHT) tablet 1 tablet (1 tablet Oral Given 10/28/19 2204)  0.9 %  sodium chloride infusion ( Intravenous Restarted 10/28/19 1459)  sodium chloride 0.9 % bolus 1,000 mL (1,000 mLs Intravenous New Bag/Given 10/27/19 1553)   stroke: mapping our early stages of recovery book ( Does not apply Given 10/27/19 2233)  potassium chloride SA (KLOR-CON) CR tablet 20 mEq (20 mEq Oral Given 10/27/19 2227)  iohexol (OMNIPAQUE) 350 MG/ML injection 75 mL (75 mLs Intravenous Contrast Given 10/27/19 2104)    ED Course  I have reviewed the triage vital signs and the nursing notes.  Pertinent labs & imaging results that were available during my care of the patient were reviewed by me and considered in my medical decision making (see chart for details).    MDM  Rules/Calculators/A&P                      History is obtained from the son.  The patient will be admitted to the hospital service for acute stroke.  I did speak with neurology who will see the patient as well.  The patient has been otherwise stable here in the emergency department.  She was given IV fluids and did seem to become more responsive. Final Clinical Impression(s) / ED Diagnoses Final diagnoses:  Cerebrovascular accident (CVA), unspecified mechanism [facility])    Rx / [place]da Orders ED Discharge Orders    None       Dalia Heading, PA-C 10/28/19 2346    Blanchie Dessert, MD 10/30/19 2156

## 2019-10-28 NOTE — TOC Initial Note (Addendum)
Transition of Care [facility]) - Initial/Assessment Note    Patient Details  Name: Suzanne Cantu MRN: [id number] Date of Birth: 1927-05-24  [facility]) CM/SW Contact:    Lawerance Sabal, RN Phone Number: 10/28/2019, 2:57 PM  Clinical Narrative:               Sherron Monday w patient and primary caregiver, daughter [name], who provided most of the information. Patient has family and caregivers at home who provide 24 hour supervision and care.  [name] reports that she stays with her mom 2 weeks or more out of the month. She reports that they have all needed DME at home including RW, WC, transport chair, rollator, BSC, handicapped accessible bathroom, shower seat, gait belt. She denies need for additional DME. She is agreeable to [facility] PT OT. [name] states they recently had [facility]. Referral placed w Cassie, awaiting on callback.  15:30 Accepted by [facility]     Expected Discharge Plan: Home w Home Health Services Barriers to Discharge: Continued Medical Work up   Patient Goals and CMS Choice Patient states their goals for this hospitalization and ongoing recovery are:: to go home CMS Medicare.gov Compare Post Acute Care list provided to:: Other (Comment Required)(daughter at bedside- [name]) Choice offered to / list presented to : Adult Children  Expected Discharge Plan and Services Expected Discharge Plan: Home w Home Health Services   Discharge Planning Services: CM Consult Post Acute Care Choice: Home Health Living arrangements for the past 2 months: Single Family Home                           HH Arranged: PT, OT          Prior Living Arrangements/Services Living arrangements for the past 2 months: Single Family Home Lives with:: Self Patient language and need for interpreter reviewed:: Yes Do you feel safe going back to the place where you live?: Yes      Need for Family Participation in Patient Care: Yes (Comment) Care giver support system in place?: Yes  (comment) Current home services: DME, Homehealth aide Criminal Activity/Legal Involvement Pertinent to Current Situation/Hospitalization: No - Comment as needed  Activities of Daily Living      Permission Sought/Granted                  Emotional Assessment              Admission diagnosis:  CVA (cerebral vascular accident) [facility] [I63.9] Patient Active Problem List   Diagnosis Date Noted  . CVA (cerebral vascular accident) (HCC) 10/27/2019  . Encephalopathy acute 07/31/2019  . Hypertensive urgency 07/31/2019  . Essential hypertension 07/31/2019  . Controlled type 2 diabetes mellitus without complication, without long-term current use of insulin (HCC) 07/31/2019  . Chronic back pain 07/31/2019  . Hypertensive emergency 07/31/2019  . Acute encephalopathy 07/31/2019   PCP:  Corky Downs, MD Pharmacy:   [facility] [phone] - [place], [place] - [address] [place] [address] Phone: [phone] Fax: [phone]     Social Determinants of Health (SDOH) Interventions    Readmission Risk Interventions No flowsheet data found.

## 2019-10-29 DIAGNOSIS — F0150 Vascular dementia without behavioral disturbance: Secondary | ICD-10-CM

## 2019-10-29 DIAGNOSIS — E871 Hypo-osmolality and hyponatremia: Secondary | ICD-10-CM

## 2019-10-29 DIAGNOSIS — E119 Type 2 diabetes mellitus without complications: Secondary | ICD-10-CM

## 2019-10-29 LAB — CBC WITH DIFFERENTIAL/PLATELET
Abs Immature Granulocytes: 0.01 10*3/uL (ref 0.00–0.07)
Basophils Absolute: 0 10*3/uL (ref 0.0–0.1)
Basophils Relative: 0 %
Eosinophils Absolute: 0.1 10*3/uL (ref 0.0–0.5)
Eosinophils Relative: 3 %
HCT: 37.3 % (ref 36.0–46.0)
Hemoglobin: 12.7 g/dL (ref 12.0–15.0)
Immature Granulocytes: 0 %
Lymphocytes Relative: 31 %
Lymphs Abs: 1.6 10*3/uL (ref 0.7–4.0)
MCH: 31.1 pg (ref 26.0–34.0)
MCHC: 34 g/dL (ref 30.0–36.0)
MCV: 91.2 fL (ref 80.0–100.0)
Monocytes Absolute: 0.6 10*3/uL (ref 0.1–1.0)
Monocytes Relative: 11 %
Neutro Abs: 2.8 10*3/uL (ref 1.7–7.7)
Neutrophils Relative %: 55 %
Platelets: 191 10*3/uL (ref 150–400)
RBC: 4.09 MIL/uL (ref 3.87–5.11)
RDW: 13.2 % (ref 11.5–15.5)
WBC: 5.1 10*3/uL (ref 4.0–10.5)
nRBC: 0 % (ref 0.0–0.2)

## 2019-10-29 LAB — BASIC METABOLIC PANEL
Anion gap: 10 (ref 5–15)
BUN: <5 mg/dL — ABNORMAL LOW (ref 8–23)
CO2: 25 mmol/L (ref 22–32)
Calcium: 9.6 mg/dL (ref 8.9–10.3)
Chloride: 102 mmol/L (ref 98–111)
Creatinine, Ser: 0.6 mg/dL (ref 0.44–1.00)
GFR calc Af Amer: >60 mL/min (ref >60)
GFR calc non Af Amer: >60 mL/min (ref >60)
Glucose, Bld: 128 mg/dL — ABNORMAL HIGH (ref 70–99)
Potassium: 3.8 mmol/L (ref 3.5–5.1)
Sodium: 137 mmol/L (ref 135–145)

## 2019-10-29 LAB — MAGNESIUM: Magnesium: 1.6 mg/dL — ABNORMAL LOW (ref 1.7–2.4)

## 2019-10-29 MED ORDER — ASPIRIN EC 325 MG PO TBEC
325.0000 mg | DELAYED_RELEASE_TABLET | Freq: Every day | ORAL | 0 refills | Status: DC
Start: 2019-10-29 — End: 2019-11-25

## 2019-10-29 MED ORDER — MAGNESIUM SULFATE 2 GM/50ML IV SOLN
2.0000 g | Freq: Once | INTRAVENOUS | Status: AC
  Administered 2019-10-29: 2 g via INTRAVENOUS
  Filled 2019-10-29: qty 50

## 2019-10-29 MED ORDER — VALSARTAN 40 MG PO TABS
40.0000 mg | ORAL_TABLET | Freq: Every day | ORAL | 0 refills | Status: DC
Start: 2019-10-29 — End: 2019-11-27

## 2019-10-29 MED ORDER — ATORVASTATIN CALCIUM 40 MG PO TABS: 40.0000 mg | ORAL_TABLET | Freq: Every day | ORAL | 0 refills | Status: DC

## 2019-10-29 MED ORDER — IRBESARTAN 75 MG PO TABS
37.5000 mg | ORAL_TABLET | Freq: Every day | ORAL | Status: DC
  Administered 2019-10-29: 37.5 mg via ORAL
  Filled 2019-10-29: qty 0.5

## 2019-10-29 NOTE — TOC Transition Note (Addendum)
Transition of Care [facility]) - CM/SW Discharge Note   Patient Details  Name: Suzanne Cantu MRN: [id number] Date of Birth: 08-Feb-1927  Transition of [facility]) CM/SW Contact:  Kermit Balo, RN Phone Number: 10/29/2019, 11:04 AM   Clinical Narrative:    Pt discharging home with [facility] services through [facility] today. Cassie with [facility] aware of d/c.  Pt has supervision at home and transport home.   Addendum: CM consulted per palliative care for palliative care at home through [facility]. CM has called Melissa with [facility] and place the referral.   Final next level of care: Home w Home Health Services Barriers to Discharge: No Barriers Identified   Patient Goals and CMS Choice Patient states their goals for this hospitalization and ongoing recovery are:: to go home CMS Medicare.gov Compare Post Acute Care list provided to:: Other (Comment Required)(daughter at bedside- [name]) Choice offered to / list presented to : Adult Children  Discharge Placement                       Discharge Plan and Services   Discharge Planning Services: CM Consult Post Acute Care Choice: Home Health                    HH Arranged: PT, OT          Social Determinants of Health (SDOH) Interventions     Readmission Risk Interventions No flowsheet data found.

## 2019-10-29 NOTE — Progress Notes (Signed)
STROKE TEAM PROGRESS NOTE   INTERVAL HISTORY Her youngest daughter Suzanne Cantu is at the bedside.  More interactive today  She has dementia at baseline and is a fall risk and not a good long-term anticoagulation candidate. Daughter stated patient had clearlr refused anticoagulation in past  Vitals:   10/28/19 2354 10/29/19 0538 10/29/19 0828 10/29/19 1204  BP: (!) 188/80 (!) 159/66 (!) 172/77 138/69  Pulse: 79 81 94 83  Resp: 18 18 18 16   Temp: 98.1 F (36.7 C) (!) 97.5 F (36.4 C) 98.2 F (36.8 C) 98.2 F (36.8 C)  TempSrc: Oral Oral Oral Oral  SpO2: 98% 98% 99% 100%  Weight:      Height:        CBC:  Recent Labs  Lab 10/27/19 1138 10/29/19 0413  WBC 8.1 5.1  NEUTROABS  --  2.8  HGB 11.8* 12.7  HCT 36.0 37.3  MCV 91.1 91.2  PLT 194 423    Basic Metabolic Panel:  Recent Labs  Lab 10/27/19 1138 10/29/19 0413  NA 133* 137  K 3.4* 3.8  CL 99 102  CO2 20* 25  GLUCOSE 153* 128*  BUN 6* <5*  CREATININE 0.52 0.60  CALCIUM 8.2* 9.6  MG  --  1.6*   Lipid Panel:     Component Value Date/Time   CHOL 185 10/28/2019 0536   TRIG 90 10/28/2019 0536   HDL 51 10/28/2019 0536   CHOLHDL 3.6 10/28/2019 0536   VLDL 18 10/28/2019 0536   LDLCALC 116 (H) 10/28/2019 0536   HgbA1c:  Lab Results  Component Value Date   HGBA1C 6.5 (H) 10/28/2019   Urine Drug Screen: No results found for: LABOPIA, COCAINSCRNUR, LABBENZ, AMPHETMU, THCU, LABBARB  Alcohol Level     Component Value Date/Time   ETH <10 03/12/2019 1920    IMAGING past 24 hours No results found.  PHYSICAL EXAM Pleasant frail elderly Caucasian lady not in distress. . Afebrile. Head is nontraumatic. Neck is supple without bruit.    Cardiac exam no murmur or gallop. Lungs are clear to auscultation. Distal pulses are well felt. Neurological Exam ;  Awake  Alert oriented x 1 to person only.  Mildly dysarthric speech which is nonfluent with poor repetition and naming.  Diminished attention, registration and recall.  Poor  insight into her condition.  [place] movements full without nystagmus.fundi were not visualized. Vision acuity and fields appear normal. Hearing is normal. Palatal movements are normal. Face symmetric. Tongue midline. Normal strength, tone, reflexes and coordination. Normal sensation. Gait deferred.  ASSESSMENT/PLAN Ms. Suzanne Cantu is a 86 y.o. 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 Small vessel disease. Atrophy.     MRI  Medial L temporal occipital lobe infarct  CTA head & neck no LVO. Distal BA w/ moderate narrowing. ACA and MCA w/ multifocal mild to moderate narrowing. Aortic atherosclerosis.   2D Echo normal ejection fraction 60 to 65%.  LDL 116  HgbA1c 6.5  Lovenox 40 mg sq daily for VTE prophylaxis  aspirin 81 mg daily prior to admission, now on aspirin 81 mg daily. Continue aspirin alone (pt refused plavix in the past and family do not want to pressure her to take meds she is not agreeable to)   Therapy recommendations:  pending   Disposition:  pending   Atrial Fibrillation  Home  anticoagulation:  none   On aspirin 81 mg daily  Not a good AC candidate given baseline dementia, predisposition to refuse meds and need for 24h care   Hypertension  Stable . Permissive hypertension (OK if < 220/120) but gradually normalize in 5-7 days . Long-term BP goal normotensive  Hyperlipidemia  Home meds:  No statin  LDL 116, goal < 70  Will not add statin given pt / family preference  Diabetes type II Controlled  HgbA1c 6.5, goal < 7.0  Other Stroke Risk Factors  Advanced age  Other Active Problems  Baseline dementia requiring care at home that family provides   Hospital day # 2  Patient presented with fall and altered mental status with speech difficulties secondary to embolic left temporal MCA  branch infarct likely from new onset atrial fibrillation.  She has not been on anticoagulation due to fall risk and dementia.  Agree with continuing aspirin and aggressive risk factor modification.   .  Long discussion with the patient and daughter at the bedside and answered questions.  Stroke team will sign off..  Discussed with Dr. Nils Pyle, MD To contact Stroke Continuity provider, please refer to [email or web address]. After hours, contact General Neurology

## 2019-10-29 NOTE — Discharge Summary (Signed)
Discharge Summary  Suzanne Cantu QIO:[id number] DOB: [birth year]  PCP: Corky Downs, MD  Admit date: 10/27/2019 Discharge date: 10/29/2019  Time spent: , more than 50% time spent on coordination of care.  Recommendations for Outpatient Follow-up:  1. F/u with PCP within a week  for hospital discharge follow up, repeat cbc/bmp at follow up 2. F/u with neurology Dr Sherryll Burger 3. Home health arranged 4. Daughter desires to follow up with outpatient palliative care, referral request sent to case manager   Discharge Diagnoses:  Active Hospital Problems   Diagnosis Date Noted  . CVA (cerebral vascular accident) (HCC) 10/27/2019    Resolved Hospital Problems  No resolved problems to display.    Discharge Condition: stable  Diet recommendation: heart healthy/carb modified  Filed Weights   10/27/19 1115 10/27/19 2123  Weight: 53.8 kg 51.2 kg    History of present illness: (per admitting MD Dr Chipper Herb) PCP: Corky Downs, MD   Patient coming from: Home  I have personally briefly reviewed patient's old medical records in [facility]  Chief Complaint: I fell  HPI: Suzanne Cantu is a 86 y.o. female with medical history significant of essential hypertension diabetes mellitus type 2, chronic pain, paroxysmal A. Fib (which only happen once in August 06, 2019 after taking Depakote, Depakote was discontinued afterwards).  Patient lives with her son, and this morning, the son heard a loud thump went to see the patient, and found patient was lying down on the bathroom floor, conscious but confused.  And resulted on patient looks like at new onset right-sided weakness, and the patient is right-sided dominant.  Patient denies any chest pain, and claims that she does not feel any weakness or numbness of any of the limbs.  No blurred vision or hearing changes. ED Course: MRI showing new acute left-sided temporal occipital infarction.  Hospital Course:  Active Problems:   CVA  (cerebral vascular accident) (HCC)   Acute CVA while on aspirin 81 mg Likely due to A. Fib, neurology consulted, she is poor candidate for anticoagulation Increase asa from  daily to  daily per neurology recommendation started Lipitor Allow permissive hypertension, gradually normalize blood pressure in the next 5 to 7 days Daughter aware of risk of recurrent stroke and continued decline, she desires to talk to palliative care Case manager to arrange outpatient palliative care   PAF Currently in sinus rhythm without rate or rhythm control medication With risk of fall, not a good candidate for anticoagulation  Hypertension Currently allow permissive hypertension Daughter prefers to resume home meds valsartan, she agrees to stop hydrochlorothiazide.  Hyponatremia  Hydrochlorothiazide discontinued Appears dehydrated on presentation Sodium 127 on presentation, normalized with hydration  Hypokalemia/Hypomagnesemia Hydrochlorothiazide discontinued Replaced   Noninsulin-dependent type 2 diabetes, controlled a1c 6.5 Home medication Metformin held since admission, resume metformin continue diet control  Vascular dementia, follow up with neurology    FTT, progressive decline since September 2020 per daughter, daughter  like to talk with palliative care  DVT Prophylaxis while in the hospital: Lovenox subcu  Code Status: Full  Family Communication: Daughter at bedside  Disposition Plan:    Patient came from:home  Anticipated d/c place:home with home health, outpatient palliative care referral   Consultants:  Neurology  Palliative care  Procedures:  None  Antibiotics:  None   Discharge Exam: BP (!) 172/77 (BP Location: Left Arm)   Pulse 94   Temp 98.2 F (36.8 C) (Oral)   Resp 18   Ht 5' (1.524 m)  Wt 51.2 kg   SpO2 99%   BMI  22.04 kg/m   General: weak, frail, pleasantly demented, confused about the time, oriented to person and place Cardiovascular: RRR Respiratory: CTABL  Discharge Instructions You were cared for by a hospitalist during your hospital stay. If you have any questions about your discharge medications or the care you received while you were in the hospital after you are discharged, you can call the unit and asked to speak with the hospitalist on call if the hospitalist that took care of you is not available. Once you are discharged, your primary care physician will handle any further medical issues. Please note that NO REFILLS for any discharge medications will be authorized once you are discharged, as it is imperative that you return to your primary care physician (or establish a relationship with a primary care physician if you do not have one) for your aftercare needs so that they can reassess your need for medications and monitor your lab values.  Discharge Instructions    Diet - low sodium heart healthy   Complete by: As directed    Carb modified diet   Increase activity slowly   Complete by: As directed      Allergies as of 10/29/2019      Reactions   Valproic Acid And Related Other (See Comments)   Low blood pressure, weakness.   Metaxalone Other (See Comments)   "It made me not feel right"   Peanut-containing Drug Products Itching      Medication List    STOP taking these medications   valproic acid 250 MG capsule Commonly known as: DEPAKENE   valsartan-hydrochlorothiazide 80-12.5 MG tablet Commonly known as: DIOVAN-HCT     TAKE these medications   aspirin EC 325 MG tablet Take 1 tablet (325 mg total) by mouth daily. What changed:   medication strength  how much to take   atorvastatin 40 MG tablet Commonly known as: LIPITOR Take 1 tablet (40 mg total) by mouth daily. Start taking on: October 30, 2019   docusate sodium 250 MG capsule Commonly known as: COLACE Take 250  mg by mouth daily.   Lumigan 0.01 % Soln Generic drug: bimatoprost Place 1 drop into both eyes at bedtime.   metFORMIN 500 MG tablet Commonly known as: GLUCOPHAGE Take 500 mg by mouth 2 (two) times daily with a meal.   PreserVision AREDS 2 Caps Take 1 capsule by mouth 2 (two) times daily.   valsartan 40 MG tablet Commonly known as: Diovan Take 1 tablet (40 mg total) by mouth daily.   vitamin B-12 500 MCG tablet Commonly known as: CYANOCOBALAMIN Take 1 tablet (500 mcg total) by mouth daily.   VITAMIN D-3 PO Take 1,000 mg by mouth daily after supper.      Allergies  Allergen Reactions  . Valproic Acid And Related Other (See Comments)    Low blood pressure, weakness.  Remus Blake Other (See Comments)    "It made me not feel right"  . Peanut-Containing Drug Products Itching   Follow-up Information    [facility] Follow up.   Why: For home health services. They will contact you in 1-2 days to set up your first home appointment.  Contact information: [address]  [address] [place] [address] [phone]       Corky Downs, MD Follow up in 1 week(s).   Specialty: Cardiology Why: hospital discharge follow up, repeat cbc/bmp at follow up. Contact information: [address] [place] [address] [phone]        Lonell Face, MD Follow up in 4 week(s).   Specialty: Neurology Why: acute stroke follow up Contact information: [address] [facility] [place] [address] [phone]            The results of significant diagnostics from this hospitalization (including imaging, microbiology, ancillary and laboratory) are listed below for reference.    Significant Diagnostic Studies: CT ANGIO HEAD W OR WO CONTRAST  Result Date: 10/27/2019 CLINICAL DATA:  Stroke follow-up EXAM: CT ANGIOGRAPHY HEAD AND NECK TECHNIQUE: Multidetector CT imaging of the head and neck was performed using the  standard protocol during bolus administration of intravenous contrast. Multiplanar CT image reconstructions and MIPs were obtained to evaluate the vascular anatomy. Carotid stenosis measurements (when applicable) are obtained utilizing NASCET criteria, using the distal internal carotid diameter as the denominator. CONTRAST:  75mL OMNIPAQUE IOHEXOL 350 MG/ML SOLN COMPARISON:  None. FINDINGS: CTA NECK FINDINGS SKELETON: There is no bony spinal canal stenosis. No lytic or blastic lesion. OTHER NECK: Normal pharynx, larynx and major salivary glands. No cervical lymphadenopathy. Unremarkable thyroid gland. UPPER CHEST: No pneumothorax or pleural effusion. No nodules or masses. AORTIC ARCH: There is mild calcific atherosclerosis of the aortic arch. There is no aneurysm, dissection or hemodynamically significant stenosis of the visualized portion of the aorta. Conventional 3 vessel aortic branching pattern. The visualized proximal subclavian arteries are widely patent. RIGHT CAROTID SYSTEM: No dissection, occlusion or aneurysm. Mild atherosclerotic calcification at the carotid bifurcation without hemodynamically significant stenosis. LEFT CAROTID SYSTEM: Normal without aneurysm, dissection or stenosis. VERTEBRAL ARTERIES: Codominant configuration. Moderate atherosclerosis of the left V1 segment. The right origin is normal aside from mild atherosclerotic calcification. CTA HEAD FINDINGS POSTERIOR CIRCULATION: --Vertebral arteries: Mild bilateral atherosclerotic calcification without hemodynamically significant stenosis. --Posterior inferior cerebellar arteries (PICA): Patent origins from the vertebral arteries. --Anterior inferior cerebellar arteries (AICA): Patent origins from the basilar artery. --Basilar artery: Moderate narrowing of the distal basilar artery. --Superior cerebellar arteries: Normal. --Posterior cerebral arteries: Normal. The right PCA is partially supplied by a posterior communicating artery (p-comm).  ANTERIOR CIRCULATION: --Intracranial internal carotid arteries: Atherosclerotic calcification of the internal carotid arteries at the skull base without hemodynamically significant stenosis. 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 There is multifocal mild-to-moderate narrowing of the A2 segments. --Middle cerebral arteries (MCA): No occlusion. Multifocal mild-to-moderate narrowing of both middle cerebral arteries, left-greater-than-right. VENOUS SINUSES: As permitted by contrast timing, patent. ANATOMIC VARIANTS: None Review of the MIP images confirms the above findings. IMPRESSION: 1. No intracranial arterial occlusion or high-grade stenosis. 2. Moderate narrowing of the distal basilar artery. 3. Multifocal mild-to-moderate narrowing of the anterior and middle cerebral arteries. 4. Moderate stenosis of the left vertebral artery V1 segment. 5. Aortic Atherosclerosis (ICD10-I70.0). Electronically Signed   By: Deatra Robinson M.D.   On: 10/27/2019 22:38   DG Chest 2 View  Result Date: 10/27/2019 CLINICAL DATA:  Altered mental status.  Hypertension. EXAM: CHEST - 2 VIEW COMPARISON:  August 06, 2019 FINDINGS: There is atelectatic change in the right base. The lungs elsewhere are clear. Heart size and pulmonary vascularity are normal. No adenopathy. There is aortic atherosclerosis. Bones are osteoporotic. IMPRESSION: Atelectasis right base. Lungs elsewhere clear. Stable cardiac silhouette. Aortic Atherosclerosis (ICD10-I70.0). Electronically Signed   By: Bretta Bang III M.D.   On: 10/27/2019 14:02   CT Head Wo Contrast  Result Date: 10/27/2019 CLINICAL DATA:  Altered mental  status. Last seen normal last night. Found on the floor this morning. EXAM: CT HEAD WITHOUT CONTRAST TECHNIQUE: Contiguous axial images were obtained from the base of the skull through the vertex without intravenous contrast. COMPARISON:  07/31/2019 FINDINGS: Brain: Age related atrophy. Chronic  small-vessel ischemic changes of the cerebral hemispheric white matter. No sign of acute infarction, mass lesion, hemorrhage, hydrocephalus or extra-axial collection. Vascular: There is atherosclerotic calcification of the major vessels at the base of the brain. Skull: Negative Sinuses/Orbits: Clear/normal Other: None IMPRESSION: No acute finding by CT. Age related atrophy. Chronic small-vessel ischemic changes of the cerebral hemispheric white matter. Electronically Signed   By: Paulina FusiMark  Shogry M.D.   On: 10/27/2019 13:26   CT ANGIO NECK W OR WO CONTRAST  Result Date: 10/27/2019 CLINICAL DATA:  Stroke follow-up EXAM: CT ANGIOGRAPHY HEAD AND NECK TECHNIQUE: Multidetector CT imaging of the head and neck was performed using the standard protocol during bolus administration of intravenous contrast. Multiplanar CT image reconstructions and MIPs were obtained to evaluate the vascular anatomy. Carotid stenosis measurements (when applicable) are obtained utilizing NASCET criteria, using the distal internal carotid diameter as the denominator. CONTRAST:  75mL OMNIPAQUE IOHEXOL 350 MG/ML SOLN COMPARISON:  None. FINDINGS: CTA NECK FINDINGS SKELETON: There is no bony spinal canal stenosis. No lytic or blastic lesion. OTHER NECK: Normal pharynx, larynx and major salivary glands. No cervical lymphadenopathy. Unremarkable thyroid gland. UPPER CHEST: No pneumothorax or pleural effusion. No nodules or masses. AORTIC ARCH: There is mild calcific atherosclerosis of the aortic arch. There is no aneurysm, dissection or hemodynamically significant stenosis of the visualized portion of the aorta. Conventional 3 vessel aortic branching pattern. The visualized proximal subclavian arteries are widely patent. RIGHT CAROTID SYSTEM: No dissection, occlusion or aneurysm. Mild atherosclerotic calcification at the carotid bifurcation without hemodynamically significant stenosis. LEFT CAROTID SYSTEM: Normal without aneurysm, dissection or  stenosis. VERTEBRAL ARTERIES: Codominant configuration. Moderate atherosclerosis of the left V1 segment. The right origin is normal aside from mild atherosclerotic calcification. CTA HEAD FINDINGS POSTERIOR CIRCULATION: --Vertebral arteries: Mild bilateral atherosclerotic calcification without hemodynamically significant stenosis. --Posterior inferior cerebellar arteries (PICA): Patent origins from the vertebral arteries. --Anterior inferior cerebellar arteries (AICA): Patent origins from the basilar artery. --Basilar artery: Moderate narrowing of the distal basilar artery. --Superior cerebellar arteries: Normal. --Posterior cerebral arteries: Normal. The right PCA is partially supplied by a posterior communicating artery (p-comm). ANTERIOR CIRCULATION: --Intracranial internal carotid arteries: Atherosclerotic calcification of the internal carotid arteries at the skull base without hemodynamically significant stenosis. 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 There is multifocal mild-to-moderate narrowing of the A2 segments. --Middle cerebral arteries (MCA): No occlusion. Multifocal mild-to-moderate narrowing of both middle cerebral arteries, left-greater-than-right. VENOUS SINUSES: As permitted by contrast timing, patent. ANATOMIC VARIANTS: None Review of the MIP images confirms the above findings. IMPRESSION: 1. No intracranial arterial occlusion or high-grade stenosis. 2. Moderate narrowing of the distal basilar artery. 3. Multifocal mild-to-moderate narrowing of the anterior and middle cerebral arteries. 4. Moderate stenosis of the left vertebral artery V1 segment. 5. Aortic Atherosclerosis (ICD10-I70.0). Electronically Signed   By: Deatra RobinsonKevin  Herman M.D.   On: 10/27/2019 22:38   MR Brain Wo Contrast (neuro protocol)  Result Date: 10/27/2019 CLINICAL DATA:  Focal neuro deficit, greater than 6 hours, stroke suspected. Altered mental status, unclear cause. EXAM: MRI HEAD  WITHOUT CONTRAST TECHNIQUE: Multiplanar, multiecho pulse sequences of the brain and surrounding structures were obtained without intravenous contrast. COMPARISON:  Noncontrast head CT performed earlier the same  day 10/27/2019, brain MRI 07/31/2019 FINDINGS: Brain: Multiple sequences are significantly motion degraded, limiting evaluation. Most notably there is moderate/severe motion degradation of the axial T2* sequence, moderate/severe motion degradation of the axial T1 weighted sequence, severe motion degradation of the axial T2 FLAIR sequence, severe motion degradation of the axial T2 weighted sequence, severe motion degradation of the coronal T2 weighted sequence and severe motion degradation of the sagittal T1 weighted sequence. There is an 8 mm focus of restricted diffusion consistent with acute infarct within the medial left temporal occipital cortex (series 3, image 20). No acute infarct is identified elsewhere within the brain. Redemonstrated moderate chronic small vessel ischemic changes within the cerebral white matter. Stable appearance of less prominent chronic small-vessel ischemic changes within the deep gray nuclei and pons in the limitations of motion degradation. No evidence of intracranial mass is identified. No chronic intracranial blood products identified on motion degraded imaging. No extra-axial fluid collection is identified. No midline shift. Vascular: Expected proximal arterial flow voids. Skull and upper cervical spine: No focal marrow lesion within the limitations of significant motion degradation. Sinuses/Orbits: Visualized orbits show no acute finding. No significant paranasal sinus disease or mastoid effusion at the imaged levels. IMPRESSION: 1. Significantly motion degraded and limited examination as described. 2. 8 mm acute cortical infarct within the medial left temporal occipital lobes. 3. Redemonstrated moderate chronic small vessel ischemic changes within the cerebral white  matter. Less severe chronic ischemic changes are also again demonstrated within the deep gray nuclei and pons. These findings are stable as compared MRI 07/31/2019 within the limitations of significant motion degradation. Electronically Signed   By: Jackey Loge DO   On: 10/27/2019 17:12   EEG adult  Result Date: 10/28/2019 Charlsie Quest, MD     10/28/2019 10:27 AM Patient Name: Suzanne Cantu MRN: [id number] Epilepsy Attending: Charlsie Quest Referring Physician/Provider: Dr. Onalee Hua Date: 10/28/2019 Duration: 24.34 minutes Patient history: 86 year old female with recurrent episodes of unclear etiology, most recent Nedra Hai presented with right-sided weakness with aphasia.  EEG to evaluate for seizures. Level of alertness: Awake AEDs during EEG study: None Technical aspects: This EEG study was done with scalp electrodes positioned according to the 10-20 International system of electrode placement. Electrical activity was acquired at a sampling rate of  and reviewed with a high frequency filter of  and a low frequency filter of . EEG data were recorded continuously and digitally stored. Description:: The posterior dominant rhythm consists of 8-9 Hz activity of moderate voltage (25-35 uV) seen predominantly in posterior head regions, symmetric and reactive to eye opening and eye closing. Intermittent left temporal 2 to 3 Hz delta slowing was also noted. Hyperventilation and photic stimulation were not performed. Abnormality -Intermittent slow, left temporal region IMPRESSION: This study is suggestive of nonspecific cortical dysfunction in left temporal region.  No seizures or epileptiform discharges were seen throughout the recording. Charlsie Quest   ECHOCARDIOGRAM COMPLETE  Result Date: 10/28/2019    ECHOCARDIOGRAM REPORT   Patient Name:   Suzanne Cantu Date of Exam: 10/28/2019 Medical Rec #:  [id number]          Height:       60.0 in Accession #:    [id number]         Weight:        112.9 lb Date of Birth:  [birth year]          BSA:          1.464 m Patient  Age:    36 years           BP:           168/72 mmHg Patient Gender: F                  HR:           89 bpm. Exam Location:  Inpatient Procedure: 2D Echo, Cardiac Doppler and Color Doppler Indications:    TIA 435.9/G45.9  History:        Patient has no prior history of Echocardiogram examinations.                 Risk Factors:Hypertension and Diabetes.  Sonographer:    Clayton Lefort RDCS (AE) Referring Phys: [id number] Lequita Halt  Sonographer Comments: Suboptimal parasternal window. IMPRESSIONS  1. Normal LV systolic function; grade 1 diastolic dysfunction; mild proximal septal thickening; mild MR.  2. Left ventricular ejection fraction, by estimation, is 60 to 65%. The left ventricle has normal function. The left ventricle has no regional wall motion abnormalities. There is mild left ventricular hypertrophy of the basal segment. Left ventricular diastolic parameters are consistent with Grade I diastolic dysfunction (impaired relaxation).  3. Right ventricular systolic function is normal. The right ventricular size is normal. There is normal pulmonary artery systolic pressure.  4. The mitral valve is normal in structure. Mild mitral valve regurgitation. No evidence of mitral stenosis.  5. The aortic valve was not well visualized. Aortic valve regurgitation is not visualized. No aortic stenosis is present. FINDINGS  Left Ventricle: Left ventricular ejection fraction, by estimation, is 60 to 65%. The left ventricle has normal function. The left ventricle has no regional wall motion abnormalities. The left ventricular internal cavity size was normal in size. There is  mild left ventricular hypertrophy of the basal segment. Left ventricular diastolic parameters are consistent with Grade I diastolic dysfunction (impaired relaxation). Right Ventricle: The right ventricular size is normal.Right ventricular systolic function is normal. There is  normal pulmonary artery systolic pressure. The tricuspid regurgitant velocity is 2.63 m/s, and with an assumed right atrial pressure of 3 mmHg, the estimated right ventricular systolic pressure is 23.7 mmHg. Left Atrium: Left atrial size was normal in size. Right Atrium: Right atrial size was normal in size. Pericardium: There is no evidence of pericardial effusion. Mitral Valve: The mitral valve is normal in structure. Normal mobility of the mitral valve leaflets. Mild mitral annular calcification. Mild mitral valve regurgitation. No evidence of mitral valve stenosis. MV peak gradient, 3.0 mmHg. The mean mitral valve gradient is 1.0 mmHg. Tricuspid Valve: The tricuspid valve is normal in structure. Tricuspid valve regurgitation is trivial. No evidence of tricuspid stenosis. Aortic Valve: The aortic valve was not well visualized. Aortic valve regurgitation is not visualized. No aortic stenosis is present. Aortic valve mean gradient measures 3.7 mmHg. Aortic valve peak gradient measures 6.7 mmHg. Aortic valve area, by VTI measures 1.97 cm. Pulmonic Valve: The pulmonic valve was not well visualized. Pulmonic valve regurgitation is not visualized. No evidence of pulmonic stenosis. Aorta: The aortic root is normal in size and structure. Venous: The inferior vena cava was not well visualized.  Additional Comments: Normal LV systolic function; grade 1 diastolic dysfunction; mild proximal septal thickening; mild MR.  LEFT VENTRICLE PLAX 2D LVIDd:         3.02 cm  Diastology LVIDs:         1.96 cm  LV e' lateral:   7.18 cm/s LV PW:  1.09 cm  LV E/e' lateral: 10.4 LV IVS:        1.30 cm  LV e' medial:    5.87 cm/s LVOT diam:     1.80 cm  LV E/e' medial:  12.7 LV SV:         55 LV SV Index:   37 LVOT Area:     2.54 cm  RIGHT VENTRICLE             IVC RV Basal diam:  2.44 cm     IVC diam: 0.94 cm RV S prime:     13.20 cm/s TAPSE (M-mode): 1.5 cm LEFT ATRIUM             Index       RIGHT ATRIUM          Index LA diam:         3.00 cm 2.05 cm/m  RA Area:     9.29 cm LA Vol (A2C):   54.9 ml 37.51 ml/m RA Volume:   17.90 ml 12.23 ml/m LA Vol (A4C):   36.1 ml 24.66 ml/m LA Biplane Vol: 48.5 ml 33.13 ml/m  AORTIC VALVE AV Area (Vmax):    2.04 cm AV Area (Vmean):   1.76 cm AV Area (VTI):     1.97 cm AV Vmax:           129.67 cm/s AV Vmean:          91.133 cm/s AV VTI:            0.278 m AV Peak Grad:      6.7 mmHg AV Mean Grad:      3.7 mmHg LVOT Vmax:         104.00 cm/s LVOT Vmean:        63.100 cm/s LVOT VTI:          0.215 m LVOT/AV VTI ratio: 0.77  AORTA Ao Root diam: 3.00 cm MITRAL VALVE               TRICUSPID VALVE MV Area (PHT): 2.62 cm    TR Peak grad:   27.7 mmHg MV Peak grad:  3.0 mmHg    TR Vmax:        263.00 cm/s MV Mean grad:  1.0 mmHg MV Vmax:       0.87 m/s    SHUNTS MV Vmean:      55.8 cm/s   Systemic VTI:  0.22 m MV Decel Time: 290 msec    Systemic Diam: 1.80 cm MV E velocity: 74.60 cm/s MV A velocity: 86.10 cm/s MV E/A ratio:  0.87 Olga Millers MD Electronically signed by Olga Millers MD Signature Date/Time: 10/28/2019/11:52:22 AM    Final     Microbiology: Recent Results (from the past 240 hour(s))  Respiratory Panel by RT PCR (Flu A&B, Covid) - Nasopharyngeal Swab     Status: None   Collection Time: 10/28/19  1:55 AM   Specimen: Nasopharyngeal Swab  Result Value Ref Range Status   SARS Coronavirus 2 by RT PCR NEGATIVE NEGATIVE Final    Comment: (NOTE) SARS-CoV-2 target nucleic acids are NOT DETECTED. The SARS-CoV-2 RNA is generally detectable in upper respiratoy specimens during the acute phase of infection. The lowest concentration of SARS-CoV-2 viral copies this assay can detect is 131 copies/mL. A negative result does not preclude SARS-Cov-2 infection and should not be used as the sole basis for treatment or other patient management decisions. A negative result may occur with  improper specimen collection/handling, submission of specimen other than nasopharyngeal swab, presence of  viral mutation(s) within the areas targeted by this assay, and inadequate number of viral copies (<131 copies/mL). A negative result must be combined with clinical observations, patient history, and epidemiological information. The expected result is Negative. Fact Sheet for Patients:  https://www.moore.com/ Fact Sheet for Healthcare Providers:  https://www.young.biz/ This test is not yet ap proved or cleared by the [place] FDA and  has been authorized for detection and/or diagnosis of SARS-CoV-2 by FDA under an Emergency Use Authorization (EUA). This EUA will remain  in effect (meaning this test can be used) for the duration of the COVID-19 declaration under Section 564(b)(1) of the Act, 21 U.S.C. section 360bbb-3(b)(1), unless the authorization is terminated or revoked sooner.    Influenza A by PCR NEGATIVE NEGATIVE Final   Influenza B by PCR NEGATIVE NEGATIVE Final    Comment: (NOTE) The Xpert Xpress SARS-CoV-2/FLU/RSV assay is intended as an aid in  the diagnosis of influenza from Nasopharyngeal swab specimens and  should not be used as a sole basis for treatment. Nasal washings and  aspirates are unacceptable for Xpert Xpress SARS-CoV-2/FLU/RSV  testing. Fact Sheet for Patients: https://www.moore.com/ Fact Sheet for Healthcare Providers: https://www.young.biz/ This test is not yet approved or cleared by the [place] FDA and  has been authorized for detection and/or diagnosis of SARS-CoV-2 by  FDA under an Emergency Use Authorization (EUA). This EUA will remain  in effect (meaning this test can be used) for the duration of the  Covid-19 declaration under Section 564(b)(1) of the Act, 21  U.S.C. section 360bbb-3(b)(1), unless the authorization is  terminated or revoked. Performed at [facility], [address] [place], [place] [address]      Labs: Basic Metabolic Panel: Recent  Labs  Lab 10/27/19 1138 10/29/19 0413  NA 133* 137  K 3.4* 3.8  CL 99 102  CO2 20* 25  GLUCOSE 153* 128*  BUN 6* <5*  CREATININE 0.52 0.60  CALCIUM 8.2* 9.6  MG  --  1.6*   Liver Function Tests: Recent Labs  Lab 10/27/19 1138  AST 18  ALT 12  ALKPHOS 49  BILITOT 0.4  PROT 5.5*  ALBUMIN 3.0*   No results for input(s): LIPASE, AMYLASE in the last 168 hours. No results for input(s): AMMONIA in the last 168 hours. CBC: Recent Labs  Lab 10/27/19 1138 10/29/19 0413  WBC 8.1 5.1  NEUTROABS  --  2.8  HGB 11.8* 12.7  HCT 36.0 37.3  MCV 91.1 91.2  PLT 194 191   Cardiac Enzymes: No results for input(s): CKTOTAL, CKMB, CKMBINDEX, TROPONINI in the last 168 hours. BNP: BNP (last 3 results) No results for input(s): BNP in the last 8760 hours.  ProBNP (last 3 results) No results for input(s): PROBNP in the last 8760 hours.  CBG: Recent Labs  Lab 10/27/19 1107 10/28/19 0618  GLUCAP 149* 122*       Signed:  Albertine Grates MD, PhD, FACP  [facility] 10/29/2019, 11:52 AM

## 2019-10-29 NOTE — Progress Notes (Signed)
Physical Therapy Treatment Patient Details Name: Suzanne Cantu MRN: [id number] DOB: 11-06-1926 Today's Date: 10/29/2019    History of Present Illness 86 y.o. female with medical history significant of essential hypertension diabetes mellitus type 2, chronic pain, paroxysmal A. Fib, ?TIAs vs seizures (demonstrates left-sided weakness that then resolves) presented to ED 10/27/19 after son found her lying on the bathroom floor with new rt-sided weakness and aphasia; MRI showing new acute left-sided temporal occipital infarction    PT Comments    Patient feeling better and tolerated longer walk followed by standing strengthening and balance activities. Daughter present and very supportive. Will further benefit from HHPT as she is not back to her baseline.    Follow Up Recommendations  Home health PT;Supervision/Assistance - 24 hour     Equipment Recommendations  None recommended by PT    Recommendations for Other Services       Precautions / Restrictions Precautions Precautions: Fall Restrictions Weight Bearing Restrictions: No    Mobility  Bed Mobility                  Transfers Overall transfer level: Needs assistance Equipment used: Rolling walker (2 wheeled) Transfers: Sit to/from Stand Sit to Stand: Min assist;Min guard         General transfer comment: initial posterior lean with min assist; progressed to min guard with repetition  Ambulation/Gait Ambulation/Gait assistance: Min assist Gait Distance (Feet): 65 Feet Assistive device: Rolling walker (2 wheeled) Gait Pattern/deviations: Step-through pattern;Decreased stride length;Drifts right/left;Trunk flexed Gait velocity: decr   General Gait Details: drifting to her left, when cued able to avoid objects on her left (without cues she runs into objects with RW); assist for turning RW in a tight space   Stairs             Wheelchair Mobility    Modified Rankin (Stroke Patients Only) Modified  Rankin (Stroke Patients Only) Pre-Morbid Rankin Score: Moderately severe disability Modified Rankin: Moderately severe disability     Balance Overall balance assessment: Needs assistance Sitting-balance support: Feet supported;No upper extremity supported Sitting balance-Leahy Scale: Fair     Standing balance support: During functional activity;No upper extremity supported Standing balance-Leahy Scale: Poor Standing balance comment: no UE support with slight posterior bias               High Level Balance Comments: standing with no UE support with head turns, overhead reaching with each UE with mild unsteadiness; with single UE support with eyes closed x 10 seconds            Cognition Arousal/Alertness: Awake/alert Behavior During Therapy: WFL for tasks assessed/performed Overall Cognitive Status: Impaired/Different from baseline Area of Impairment: Following commands;Problem solving                       Following Commands: Follows one step commands with increased time     Problem Solving: Slow processing;Requires verbal cues General Comments: slow processing when asked to stand up (each time x 3); vc for turning towards her IV tubing      Exercises General Exercises - Lower Extremity Hip Flexion/Marching: AROM;Both;10 reps;Standing(bil UE support on RW) Mini-Sqauts: Strengthening;Both;10 reps;Standing(chair behind; initially bil UE support progressed to none)    General Comments        Pertinent Vitals/Pain Pain Assessment: No/denies pain    Home Living                      Prior  Function            PT Goals (current goals can now be found in the care plan section) Acute Rehab PT Goals Patient Stated Goal: to go home PT Goal Formulation: With patient/family Time For Goal Achievement: 11/11/19 Potential to Achieve Goals: Good Progress towards PT goals: Progressing toward goals    Frequency    Min 4X/week      PT Plan  Current plan remains appropriate    Co-evaluation              AM-PAC PT "6 Clicks" Mobility   Outcome Measure  Help needed turning from your back to your side while in a flat bed without using bedrails?: None Help needed moving from lying on your back to sitting on the side of a flat bed without using bedrails?: A Little Help needed moving to and from a bed to a chair (including a wheelchair)?: A Little Help needed standing up from a chair using your arms (e.g., wheelchair or bedside chair)?: A Little Help needed to walk in hospital room?: A Little Help needed climbing 3-5 steps with a railing? : A Lot 6 Click Score: 18    End of Session Equipment Utilized During Treatment: Gait belt Activity Tolerance: Patient tolerated treatment well Patient left: in chair;with call bell/phone within reach;with chair alarm set;with family/visitor present   PT Visit Diagnosis: Unsteadiness on feet (R26.81);Other abnormalities of gait and mobility (R26.89);History of falling (Z91.81)     Time: 3532-9924 PT Time Calculation (min) (ACUTE ONLY): 27 min  Charges:  $Gait Training: 8-22 mins $Therapeutic Exercise: 8-22 mins                      Arby Barrette, PT Pager [phone]    Rexanne Mano 10/29/2019, 12:20 PM

## 2019-10-30 ENCOUNTER — Other Ambulatory Visit: Payer: Self-pay

## 2019-10-30 ENCOUNTER — Encounter: Payer: Self-pay | Admitting: Emergency Medicine

## 2019-10-30 ENCOUNTER — Emergency Department
Admission: EM | Admit: 2019-10-30 | Discharge: 2019-10-30 | Disposition: A | Discharge status: Home / Self Care | Payer: Medicare Other | Attending: Emergency Medicine | Admitting: Emergency Medicine

## 2019-10-30 DIAGNOSIS — R112 Nausea with vomiting, unspecified: Secondary | ICD-10-CM | POA: Diagnosis not present

## 2019-10-30 DIAGNOSIS — Z91010 Allergy to peanuts: Secondary | ICD-10-CM | POA: Insufficient documentation

## 2019-10-30 DIAGNOSIS — R1110 Vomiting, unspecified: Secondary | ICD-10-CM | POA: Diagnosis present

## 2019-10-30 DIAGNOSIS — R030 Elevated blood-pressure reading, without diagnosis of hypertension: Secondary | ICD-10-CM

## 2019-10-30 DIAGNOSIS — Z79899 Other long term (current) drug therapy: Secondary | ICD-10-CM | POA: Diagnosis not present

## 2019-10-30 DIAGNOSIS — I10 Essential (primary) hypertension: Secondary | ICD-10-CM | POA: Diagnosis not present

## 2019-10-30 DIAGNOSIS — E119 Type 2 diabetes mellitus without complications: Secondary | ICD-10-CM | POA: Insufficient documentation

## 2019-10-30 NOTE — ED Triage Notes (Signed)
Pt arrives via [facility] from home c/o Nausea and HTN. Pt had stroke on Monday and family called EMS to get pt checked out.

## 2019-10-30 NOTE — ED Provider Notes (Signed)
[facility] Emergency Department Provider Note   ____________________________________________    I have reviewed the triage vital signs and the nursing notes.   HISTORY  Chief Complaint Hypertension     HPI Suzanne Cantu is a 86 y.o. female with a history of diabetes, hypertension who was recently discharged from the hospital after having a CVA.  Has been doing well at home however this morning started all of her medications and approximately 1 hour later had an episode of vomiting because she felt like something was stuck in her throat.  Daughter was with her and checked her blood pressure shortly after vomiting and found it to be elevated and therefore called EMS.  Now the patient is feeling much better and her blood pressure has improved.  Has not had any difficulty with swallowing.  No headache or new neuro deficits  Past Medical History:  Diagnosis Date  . Diabetes mellitus without complication (HCC)   . Hypertension     Patient Active Problem List   Diagnosis Date Noted  . CVA (cerebral vascular accident) (HCC) 10/27/2019  . Encephalopathy acute 07/31/2019  . Hypertensive urgency 07/31/2019  . Essential hypertension 07/31/2019  . Controlled type 2 diabetes mellitus without complication, without long-term current use of insulin (HCC) 07/31/2019  . Chronic back pain 07/31/2019  . Hypertensive emergency 07/31/2019  . Acute encephalopathy 07/31/2019    Past Surgical History:  Procedure Laterality Date  . ABDOMINAL HYSTERECTOMY    . CESAREAN SECTION     4  . CHOLECYSTECTOMY    . KIDNEY STONE SURGERY      Prior to Admission medications   Medication Sig Start Date End Date Taking? Authorizing Provider  aspirin EC 325 MG tablet Take 1 tablet (325 mg total) by mouth daily. 10/29/19 10/28/20  Albertine Grates, MD  atorvastatin (LIPITOR) 40 MG tablet Take 1 tablet (40 mg total) by mouth daily. 10/30/19   Albertine Grates, MD  bimatoprost (LUMIGAN) 0.01 %  SOLN Place 1 drop into both eyes at bedtime.    [provider]  Cholecalciferol (VITAMIN D-3 PO) Take 1,000 mg by mouth daily after supper.     [provider]  docusate sodium (COLACE) 250 MG capsule Take 250 mg by mouth daily.    [provider]  metFORMIN (GLUCOPHAGE) 500 MG tablet Take 500 mg by mouth 2 (two) times daily with a meal.     [provider]  Multiple Vitamins-Minerals (PRESERVISION AREDS 2) CAPS Take 1 capsule by mouth 2 (two) times daily.    [provider]  valsartan (DIOVAN) 40 MG tablet Take 1 tablet (40 mg total) by mouth daily. 10/29/19 10/28/20  Albertine Grates, MD  vitamin B-12 (CYANOCOBALAMIN) 500 MCG tablet Take 1 tablet (500 mcg total) by mouth daily. 08/02/19   Myrtie Neither, MD     Allergies Valproic acid and related, Metaxalone, and Peanut-containing drug products  History reviewed. No pertinent family history.  Social History Social History   Tobacco Use  . Smoking status: Never Smoker  . Smokeless tobacco: Never Used  Substance Use Topics  . Alcohol use: Never  . Drug use: Not on file    Review of Systems  Constitutional: No reported fever Eyes: No visual changes.  ENT: No difficulty swallowing Cardiovascular: No reported chest pain Respiratory: No difficulty breathing Gastrointestinal no abdominal pain Genitourinary: No genitourinary complaints Musculoskeletal: Negative for back pain. Skin: Negative for rash. Neurological: As above   ____________________________________________   PHYSICAL EXAM:  VITAL SIGNS: ED Triage Vitals  Enc Vitals Group     BP 10/30/19 1326 (!) 186/77     Pulse Rate 10/30/19 1326 76     Resp 10/30/19 1326 16     Temp 10/30/19 1326 98.5 F (36.9 C)     Temp Source 10/30/19 1326 Oral     SpO2 10/30/19 1326 97 %     Weight 10/30/19 1329 50.8 kg (112 lb)     Height 10/30/19 1329 1.524 m (5')     Head Circumference --      Peak Flow --      Pain Score 10/30/19 1327 0      Pain Loc --      Pain Edu? --      Excl. in [place]? --     Constitutional: Alert and oriented.  Eyes: Conjunctivae are normal.  Head: Atraumatic. Nose: No congestion/rhinnorhea. Mouth/Throat: Mucous membranes are moist.  Pharynx appears normal Neck:  Painless ROM Cardiovascular: Normal rate, regular rhythm.  Good peripheral circulation. Respiratory: Normal respiratory effort.  No retractions.  Musculoskeletal:  Warm and well perfused Neurologic:  Normal speech and language. No gross focal neurologic deficits are appreciated.  Skin:  Skin is warm, dry and intact. No rash noted. Psychiatric: Mood and affect are normal. Speech and behavior are normal.  ____________________________________________   LABS (all labs ordered are listed, but only abnormal results are displayed)  Labs Reviewed - No data to display ____________________________________________  EKG   ____________________________________________  RADIOLOGY   ____________________________________________   PROCEDURES  Procedure(s) performed: No  Procedures   Critical Care performed: No ____________________________________________   INITIAL IMPRESSION / ASSESSMENT AND PLAN / ED COURSE  Pertinent labs & imaging results that were available during my care of the patient were reviewed by me and considered in my medical decision making (see chart for details).  Patient is well-appearing here and asymptomatic she and daughter are reassured by decreasing blood pressure.  She is not having difficulty with swallowing, does have outpatient speech evaluation already planned.  No further nausea, suspect this was related to taking all of her medications at once.  Daughter and I discussed spreading out medication administration to see if this would help.  Daughter and patient are comfortable with discharge    ____________________________________________   FINAL CLINICAL IMPRESSION(S) / ED DIAGNOSES  Final diagnoses:    Non-intractable vomiting with nausea, unspecified vomiting type  Elevated blood pressure reading        Note:  This document was prepared using Dragon voice recognition software and may include unintentional dictation errors.   Lavonia Drafts, MD 10/30/19 1535

## 2019-10-30 NOTE — ED Notes (Signed)
ED Provider at bedside. 

## 2019-10-31 ENCOUNTER — Telehealth: Payer: Self-pay | Admitting: Adult Health Nurse Practitioner

## 2019-10-31 NOTE — Telephone Encounter (Signed)
Spoke with patient's daughter [name], regarding Palliative services and all questions were answered and she was in agreement with this and she said that her Mom was as well.  I have scheduled an In-person Consult for 11/07/19 @ 8:30 AM

## 2019-11-03 ENCOUNTER — Other Ambulatory Visit: Payer: Self-pay | Admitting: *Deleted

## 2019-11-03 NOTE — Patient Outreach (Addendum)
[facility]) Care Management  11/03/2019  Suzanne Cantu 30-Mar-1927 [id number]   RED ON EMMI ALERT - Stroke Day # 1 Date: 4/30 Red Alert Reason: Question/problem with medications, problem setting up rehab, and Not been able to take every dose of medications  RED ON EMMI ALERT - Stroke Day # 3 Date: 5/2 Red Alert Reason: Question/problem with medications   Outreach attempt #1, successful, identity verified by daughter Suzanne Cantu.  This care manager introduced self and stated purpose of call.  [facility] care management services explained.  She is familiar with program as member received calls in the past upon discharge.  Suzanne Cantu state she answered the questions in a way that would trigger a follow up call.  Report member was seen in the ED on 4/29 due to elevated blood pressure and abdominal discomfort after taking medications.  They were advised to stagger her meds to make it easier on her stomach.  They have since started that and member has done well with administration.  She wanted to review the medications times with this RNCM to make sure it was ok.  Member is taking daily meds spread throughout the day, BID meds taken in the morning and the evening.  Reassured no expected issues with this timing.  Suzanne Cantu confirms that member has started with PT and ST, OT will start soon.  She has follow up appointments with PCP on 5/7, palliative on 5/7, and neuro on 5/26.  She and other friends/family are taking turns caring for the member, Suzanne Cantu report having a "village."  She denies any urgent concerns at this time, will follow up within the next 2 weeks prior to case closure.  Encouraged daughter to contact this care manager with questions.  [name], [place], MSN [facility] Care Management  [facility] Manager [phone]

## 2019-11-07 ENCOUNTER — Ambulatory Visit (INDEPENDENT_AMBULATORY_CARE_PROVIDER_SITE_OTHER): Payer: Medicare Other | Admitting: Internal Medicine

## 2019-11-07 ENCOUNTER — Encounter: Payer: Self-pay | Admitting: Internal Medicine

## 2019-11-07 ENCOUNTER — Other Ambulatory Visit: Payer: Self-pay

## 2019-11-07 ENCOUNTER — Other Ambulatory Visit: Payer: Medicare Other | Admitting: Adult Health Nurse Practitioner

## 2019-11-07 VITALS — BP 149/67 | HR 87

## 2019-11-07 DIAGNOSIS — Z8673 Personal history of transient ischemic attack (TIA), and cerebral infarction without residual deficits: Secondary | ICD-10-CM

## 2019-11-07 DIAGNOSIS — E119 Type 2 diabetes mellitus without complications: Secondary | ICD-10-CM

## 2019-11-07 DIAGNOSIS — I10 Essential (primary) hypertension: Secondary | ICD-10-CM

## 2019-11-07 DIAGNOSIS — Z515 Encounter for palliative care: Secondary | ICD-10-CM

## 2019-11-07 NOTE — Progress Notes (Signed)
Established Patient Office Visit  Subjective:  Patient ID: Suzanne Cantu, female    DOB: 01/13/1927  Age: 86 y.o. MRN: [id number]  CC:  Chief Complaint  Patient presents with  . Transitions Of Care    HPI  Suzanne Cantu presents for patient came for checkup and she was admitted into the hospital with stroke on the right side of the body and she is recovering fairly well from that her blood pressure is under control as well has her diabetes she is tolerating aspirin well not a candidate for any anticoagulant therapy  Past Medical History:  Diagnosis Date  . Diabetes mellitus without complication ([place])   . Hypertension     Past Surgical History:  Procedure Laterality Date  . ABDOMINAL HYSTERECTOMY    . CESAREAN SECTION     4  . CHOLECYSTECTOMY    . KIDNEY STONE SURGERY      No family history on file.  Social History   Socioeconomic History  . Marital status: Widowed    Spouse name: Not on file  . Number of children: Not on file  . Years of education: Not on file  . Highest education level: Not on file  Occupational History  . Not on file  Tobacco Use  . Smoking status: Never Smoker  . Smokeless tobacco: Never Used  Substance and Sexual Activity  . Alcohol use: Never  . Drug use: Not on file  . Sexual activity: Not on file  Other Topics Concern  . Not on file  Social History Narrative  . Not on file   Social Determinants of Health   Financial Resource Strain:   . Difficulty of Paying Living Expenses:   Food Insecurity:   . Worried About Charity fundraiser in the Last Year:   . Arboriculturist in the Last Year:   Transportation Needs:   . Film/video editor (Medical):   Marland Kitchen Lack of Transportation (Non-Medical):   Physical Activity:   . Days of Exercise per Week:   . Minutes of Exercise per Session:   Stress:   . Feeling of Stress :   Social Connections:   . Frequency of Communication with Friends and Family:   . Frequency of Social  Gatherings with Friends and Family:   . Attends Religious Services:   . Active Member of Clubs or Organizations:   . Attends Archivist Meetings:   Marland Kitchen Marital Status:   Intimate Partner Violence:   . Fear of Current or Ex-Partner:   . Emotionally Abused:   Marland Kitchen Physically Abused:   . Sexually Abused:      Current Outpatient Medications:  .  aspirin EC 325 MG tablet, Take 1 tablet (325 mg total) by mouth daily., Disp: 100 tablet, Rfl: 0 .  bimatoprost (LUMIGAN) 0.01 % SOLN, Place 1 drop into both eyes at bedtime., Disp: , Rfl:  .  Cholecalciferol (VITAMIN D-3 PO), Take 1,000 mg by mouth daily after supper. , Disp: , Rfl:  .  docusate sodium (COLACE) 250 MG capsule, Take 250 mg by mouth daily., Disp: , Rfl:  .  metFORMIN (GLUCOPHAGE) 500 MG tablet, Take 500 mg by mouth 2 (two) times daily with a meal. , Disp: , Rfl:  .  Multiple Vitamins-Minerals (PRESERVISION AREDS 2) CAPS, Take 1 capsule by mouth 2 (two) times daily., Disp: , Rfl:  .  valsartan (DIOVAN) 40 MG tablet, Take 1 tablet (40 mg total) by mouth daily., Disp: 30 tablet,  Rfl: 0 .  vitamin B-12 (CYANOCOBALAMIN) 500 MCG tablet, Take 1 tablet (500 mcg total) by mouth daily., Disp: 30 tablet, Rfl: 0   Allergies  Allergen Reactions  . Valproic Acid And Related Other (See Comments)    Low blood pressure, weakness.  Remus Blake Other (See Comments)    "It made me not feel right"  . Peanut-Containing Drug Products Itching    ROS Review of Systems  Constitutional: Negative for fatigue.  HENT: Negative for ear discharge.   Eyes: Negative for pain.  Respiratory: Negative for choking and wheezing.   Gastrointestinal: Negative for abdominal pain.  Genitourinary: Negative for dysuria.  Musculoskeletal: Negative for back pain, joint swelling and myalgias.  Neurological: Negative for dizziness, tremors, seizures, facial asymmetry, speech difficulty, weakness, light-headedness, numbness and headaches.  Psychiatric/Behavioral:  Negative for agitation and behavioral problems.      Objective:    Physical Exam  Constitutional: She is oriented to person, place, and time. She appears well-developed and well-nourished.  HENT:  Head: Normocephalic and atraumatic.  Eyes: Pupils are equal, round, and reactive to light.  Neck: No tracheal deviation present. No thyromegaly present.  Cardiovascular: Normal heart sounds.  No murmur heard. Pulmonary/Chest: She has no wheezes.  Abdominal: There is abdominal tenderness.  Musculoskeletal:        General: Edema present.     Cervical back: Normal range of motion and neck supple.  Lymphadenopathy:    She has no cervical adenopathy.  Neurological: She is alert and oriented to person, place, and time.  Skin: Skin is warm.    BP (!) 149/67   Pulse 87  Wt Readings from Last 3 Encounters:  10/30/19 112 lb (50.8 kg)  10/27/19 112 lb 14 oz (51.2 kg)  08/06/19 118 lb 9.7 oz (53.8 kg)     Health Maintenance Due  Topic Date Due  . FOOT EXAM  Never done  . OPHTHALMOLOGY EXAM  Never done  . COVID-19 Vaccine (1) Never done  . DEXA SCAN  Never done  . PNA vac Low Risk Adult (1 of 2 - PCV13) Never done    There are no preventive care reminders to display for this patient.  Lab Results  Component Value Date   TSH 2.468 08/01/2019   Lab Results  Component Value Date   WBC 5.1 10/29/2019   HGB 12.7 10/29/2019   HCT 37.3 10/29/2019   MCV 91.2 10/29/2019   PLT 191 10/29/2019   Lab Results  Component Value Date   NA 137 10/29/2019   K 3.8 10/29/2019   CO2 25 10/29/2019   GLUCOSE 128 (H) 10/29/2019   BUN <5 (L) 10/29/2019   CREATININE 0.60 10/29/2019   BILITOT 0.4 10/27/2019   ALKPHOS 49 10/27/2019   AST 18 10/27/2019   ALT 12 10/27/2019   PROT 5.5 (L) 10/27/2019   ALBUMIN 3.0 (L) 10/27/2019   CALCIUM 9.6 10/29/2019   ANIONGAP 10 10/29/2019   Lab Results  Component Value Date   CHOL 185 10/28/2019   Lab Results  Component Value Date   HDL 51 10/28/2019    Lab Results  Component Value Date   LDLCALC 116 (H) 10/28/2019   Lab Results  Component Value Date   TRIG 90 10/28/2019   Lab Results  Component Value Date   CHOLHDL 3.6 10/28/2019   Lab Results  Component Value Date   HGBA1C 6.5 (H) 10/28/2019      Assessment & Plan:   Problem List Items Addressed This Visit  Cardiovascular and Mediastinum   Essential hypertension    Continue current meds. Off HCTZ due to hyponatremia        Endocrine   Controlled type 2 diabetes mellitus without complication, without long-term current use of insulin (HCC)    Continue current meds        Other   History of CVA (cerebrovascular accident) - Primary    Patient was admitted in the hospital with a right leg weakness she has recovered remarkably well and now she has a good grip in the both hands she still wheelchair-bound and has been X exercising.  She said that she does not like to take statin so it was stopped.  Her aspirin dose is 325 mg/day she was advised to take it with food her blood pressure is under control we will bring it down slowly her hydrochlorothiazide was stopped because of the hyponatremia she does not have any arrhythmia she is in normal sinus rhythm on clinical examination her stroke most likely due to paroxysmal atrial fibrillation but she is not a candidate for anticoagulation and this was discussed with the family in the hospital by the hospitalist         No orders of the defined types were placed in this encounter.   Follow-up: Return in about 4 weeks (around 12/05/2019).    Corky Downs, MD

## 2019-11-07 NOTE — Assessment & Plan Note (Signed)
Continue current meds. Off HCTZ due to hyponatremia

## 2019-11-07 NOTE — Assessment & Plan Note (Signed)
Continue current meds 

## 2019-11-07 NOTE — Assessment & Plan Note (Signed)
Patient was admitted in the hospital with a right leg weakness she has recovered remarkably well and now she has a good grip in the both hands she still wheelchair-bound and has been X exercising.  She said that she does not like to take statin so it was stopped.  Her aspirin dose is 325 mg/day she was advised to take it with food her blood pressure is under control we will bring it down slowly her hydrochlorothiazide was stopped because of the hyponatremia she does not have any arrhythmia she is in normal sinus rhythm on clinical examination her stroke most likely due to paroxysmal atrial fibrillation but she is not a candidate for anticoagulation and this was discussed with the family in the hospital by the hospitalist

## 2019-11-07 NOTE — Progress Notes (Signed)
Therapist, nutritional Palliative Care Consult Note Telephone: [phone]  Fax: [phone]  PATIENT NAME: Tabia Landowski DOB: 01-30-1927 MRN: [id number]  PRIMARY CARE PROVIDER:   Corky Downs, MD  REFERRING PROVIDER:  Corky Downs, MD [address] [place],  [place] [address]  RESPONSIBLE PARTY:   Renaldo Reel, daughter [phone]     RECOMMENDATIONS and PLAN:  1.  Advanced care planning.  Patient had filled out living will in 2015.  Left MOST form for family to go over and will fill out at a future visit.    2.  A-fib.  Patient believed to have CVA on 10/27/19 related to a-fib. She has had 3 hospital visits over 5 months prior to this for possible TIAs. She is not on anticoagulation due to fall risk but her aspirin was increased to 325mg  daily. Her CVA caused right sided weakness.  Today she seems to be improving as she is not leaning to the right, is able to ambulate with walker and able to lift both arms over her head equally.  She is working with [facility] home health PT and ST and will start OT soon.  Daughter does state that prior to CVA she was using a walker and she is able to use walker now but she is weaker.  She does require assistance with ADLs but is able to feed herself.  She has occasional incontinence.  Daughter states that since the CVA she has had problems with word finding which tends to get worse later in the day.  She has not had any falls since being back home.  Denies headache, dizziness, pain, chest pain, palpitations, edema, N/V/D, constipation.  Continue supportive care at home and continue PT, ST, OT through home health as ordered.    3.  Appetite.  Patient has good appetite.  Did lose some weight when she was caring for husband prior to his passing in 2019.  Weight has been stable since at around 114 pounds.  She does eat 3 meals and snacks.    4.  Support.  Daughter states that between family and caregivers that her mother has 24/7  help in the home.  Daughter states that they don't leave her alone.    Palliative will continue to monitor for symptom management/decline and make recommendations as needed.  Daughter will call next week to set up next appointment.  Encouraged to call with any questions or concerns  I spent 110 minutes providing this consultation,  from 8:30 to 10:20 including time spent with patient/family, chart review, provider coordination, documentation. More than 50% of the time in this consultation was spent coordinating communication.   HISTORY OF PRESENT ILLNESS:  Leisha Trinkle is a 86 y.o. year old female with multiple medical problems including weakness post CVA, HTN, DMT2, a-fib. Palliative Care was asked to help address goals of care.   CODE STATUS: see above  PPS: 40% HOSPICE ELIGIBILITY/DIAGNOSIS: TBD  PHYSICAL EXAM:  BP 140/62  HR 88  O2 97% on RA General: NAD, frail appearing, thin Cardiovascular: regular rate and rhythm Pulmonary: lung sounds clear; normal respiratory effort Abdomen: soft, nontender, + bowel sounds GU: no suprapubic tenderness Extremities: no edema, no joint deformities Skin: no rashes Neurological: Weakness but otherwise nonfocal; having some problems with word finding especially when tired.   PAST MEDICAL HISTORY:  Past Medical History:  Diagnosis Date  . Diabetes mellitus without complication (HCC)   . Hypertension     SOCIAL HX:  Social  History   Tobacco Use  . Smoking status: Never Smoker  . Smokeless tobacco: Never Used  Substance Use Topics  . Alcohol use: Never    ALLERGIES:  Allergies  Allergen Reactions  . Valproic Acid And Related Other (See Comments)    Low blood pressure, weakness.  Janace Hoard Other (See Comments)    "It made me not feel right"  . Peanut-Containing Drug Products Itching     PERTINENT MEDICATIONS:  Outpatient Encounter Medications as of 11/07/2019  Medication Sig  . aspirin EC 325 MG tablet Take 1 tablet (325 mg  total) by mouth daily.  Marland Kitchen atorvastatin (LIPITOR) 40 MG tablet Take 1 tablet (40 mg total) by mouth daily.  . bimatoprost (LUMIGAN) 0.01 % SOLN Place 1 drop into both eyes at bedtime.  . Cholecalciferol (VITAMIN D-3 PO) Take 1,000 mg by mouth daily after supper.   . docusate sodium (COLACE) 250 MG capsule Take 250 mg by mouth daily.  . metFORMIN (GLUCOPHAGE) 500 MG tablet Take 500 mg by mouth 2 (two) times daily with a meal.   . Multiple Vitamins-Minerals (PRESERVISION AREDS 2) CAPS Take 1 capsule by mouth 2 (two) times daily.  . valsartan (DIOVAN) 40 MG tablet Take 1 tablet (40 mg total) by mouth daily.  . vitamin B-12 (CYANOCOBALAMIN) 500 MCG tablet Take 1 tablet (500 mcg total) by mouth daily.   No facility-administered encounter medications on file as of 11/07/2019.     Dakota Stangl Jenetta Downer, NP

## 2019-11-18 ENCOUNTER — Other Ambulatory Visit: Payer: Self-pay | Admitting: *Deleted

## 2019-11-18 NOTE — Patient Outreach (Signed)
[facility]) Care Management  11/18/2019  Naseem Adler Dec 10, 1926 [id number]   Call placed to member's daughter [name] to follow up on [facility] hospital recovery and close case, no answer, unable to leave voice message as mail box was full.  Call also placed to member's listed home number, no answer, unable to leave message.  Will follow up within the next 3-4 business days.  [name], [place], MSN [facility] Care Management  [facility] Manager [phone]

## 2019-11-21 ENCOUNTER — Other Ambulatory Visit: Payer: Self-pay | Admitting: *Deleted

## 2019-11-21 NOTE — Patient Outreach (Signed)
[facility]) Care Management  11/21/2019  Benisha Hadaway 01-17-1927 [id number]   Call placed to member's daughter to follow up on recovery from stroke and hospitalization. She report member is still improving, had visit yesterday with PT and today with OT, worked well with them.  State she did have some confusion and loss of words a few days ago but was better the next day.  She has had follow up appointment with PCP and initial home visit with palliative care, they will continue to visit monthly.  No further needs identified at this time, will close case.  [name], [place], MSN [facility] Care Management  [facility] Manager [phone]

## 2019-11-25 ENCOUNTER — Emergency Department (HOSPITAL_COMMUNITY)
Admission: EM | Admit: 2019-11-25 | Discharge: 2019-11-25 | Disposition: A | Discharge status: Home / Self Care | Payer: Medicare Other | Attending: Emergency Medicine | Admitting: Emergency Medicine

## 2019-11-25 ENCOUNTER — Other Ambulatory Visit: Payer: Self-pay

## 2019-11-25 ENCOUNTER — Encounter (HOSPITAL_COMMUNITY): Payer: Self-pay | Admitting: Emergency Medicine

## 2019-11-25 ENCOUNTER — Emergency Department (HOSPITAL_COMMUNITY): Payer: Medicare Other

## 2019-11-25 DIAGNOSIS — Z7984 Long term (current) use of oral hypoglycemic drugs: Secondary | ICD-10-CM | POA: Insufficient documentation

## 2019-11-25 DIAGNOSIS — E119 Type 2 diabetes mellitus without complications: Secondary | ICD-10-CM | POA: Insufficient documentation

## 2019-11-25 DIAGNOSIS — Z79899 Other long term (current) drug therapy: Secondary | ICD-10-CM | POA: Insufficient documentation

## 2019-11-25 DIAGNOSIS — I480 Paroxysmal atrial fibrillation: Secondary | ICD-10-CM | POA: Insufficient documentation

## 2019-11-25 DIAGNOSIS — Z91010 Allergy to peanuts: Secondary | ICD-10-CM | POA: Insufficient documentation

## 2019-11-25 DIAGNOSIS — I10 Essential (primary) hypertension: Secondary | ICD-10-CM | POA: Insufficient documentation

## 2019-11-25 DIAGNOSIS — R4781 Slurred speech: Secondary | ICD-10-CM | POA: Diagnosis present

## 2019-11-25 DIAGNOSIS — G459 Transient cerebral ischemic attack, unspecified: Secondary | ICD-10-CM | POA: Insufficient documentation

## 2019-11-25 HISTORY — DX: Cerebral infarction, unspecified: I63.9

## 2019-11-25 LAB — COMPREHENSIVE METABOLIC PANEL
ALT: 13 U/L (ref 0–44)
AST: 21 U/L (ref 15–41)
Albumin: 3.4 g/dL — ABNORMAL LOW (ref 3.5–5.0)
Alkaline Phosphatase: 57 U/L (ref 38–126)
Anion gap: 16 — ABNORMAL HIGH (ref 5–15)
BUN: 10 mg/dL (ref 8–23)
CO2: 23 mmol/L (ref 22–32)
Calcium: 9.9 mg/dL (ref 8.9–10.3)
Chloride: 95 mmol/L — ABNORMAL LOW (ref 98–111)
Creatinine, Ser: 0.81 mg/dL (ref 0.44–1.00)
GFR calc Af Amer: >60 mL/min (ref >60)
GFR calc non Af Amer: >60 mL/min (ref >60)
Glucose, Bld: 192 mg/dL — ABNORMAL HIGH (ref 70–99)
Potassium: 4.1 mmol/L (ref 3.5–5.1)
Sodium: 134 mmol/L — ABNORMAL LOW (ref 135–145)
Total Bilirubin: 0.6 mg/dL (ref 0.3–1.2)
Total Protein: 6.3 g/dL — ABNORMAL LOW (ref 6.5–8.1)

## 2019-11-25 LAB — CBC WITH DIFFERENTIAL/PLATELET
Abs Immature Granulocytes: 0.02 10*3/uL (ref 0.00–0.07)
Basophils Absolute: 0 10*3/uL (ref 0.0–0.1)
Basophils Relative: 0 %
Eosinophils Absolute: 0.1 10*3/uL (ref 0.0–0.5)
Eosinophils Relative: 2 %
HCT: 38.3 % (ref 36.0–46.0)
Hemoglobin: 12.6 g/dL (ref 12.0–15.0)
Immature Granulocytes: 0 %
Lymphocytes Relative: 22 %
Lymphs Abs: 1.4 10*3/uL (ref 0.7–4.0)
MCH: 30.5 pg (ref 26.0–34.0)
MCHC: 32.9 g/dL (ref 30.0–36.0)
MCV: 92.7 fL (ref 80.0–100.0)
Monocytes Absolute: 0.5 10*3/uL (ref 0.1–1.0)
Monocytes Relative: 8 %
Neutro Abs: 4.3 10*3/uL (ref 1.7–7.7)
Neutrophils Relative %: 68 %
Platelets: 202 10*3/uL (ref 150–400)
RBC: 4.13 MIL/uL (ref 3.87–5.11)
RDW: 12.9 % (ref 11.5–15.5)
WBC: 6.4 10*3/uL (ref 4.0–10.5)
nRBC: 0 % (ref 0.0–0.2)

## 2019-11-25 IMAGING — CT CT HEAD W/O CM
3 series · 15 of 47 positions shown, 18 images · non-contrast
Comparison: [DATE]

CLINICAL DATA: Left-sided facial droop, slurred speech, last known
well [DATE] at [DATE] p.m.

EXAM:
CT HEAD WITHOUT CONTRAST
TECHNIQUE: Contiguous axial images were obtained from the base of the skull
through the vertex without intravenous contrast.

[Series 2: head 5.0 (person_name)30(person_name) (person_name · axial · 0.43mm/px · z∈[-128,+17]mm · 9 of 35 slices shown, 12 images]
[im 3/35  brain]
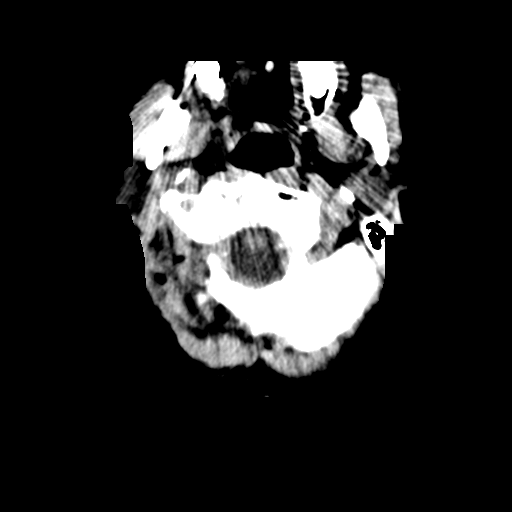
[im 3/35  bone]
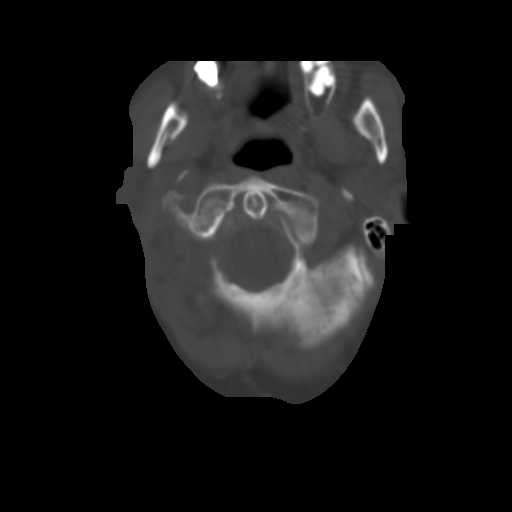
[im 6/35  brain]
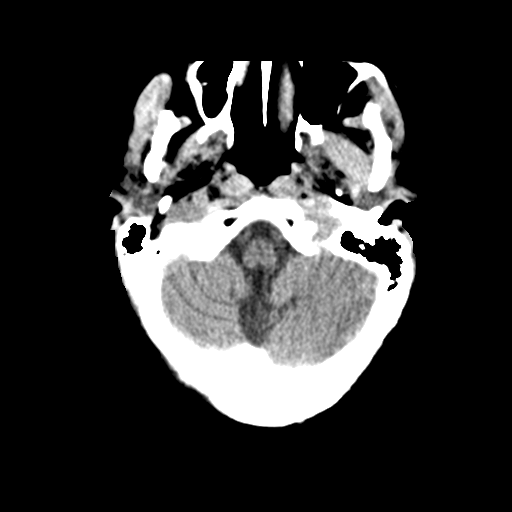
[im 10/35  brain]
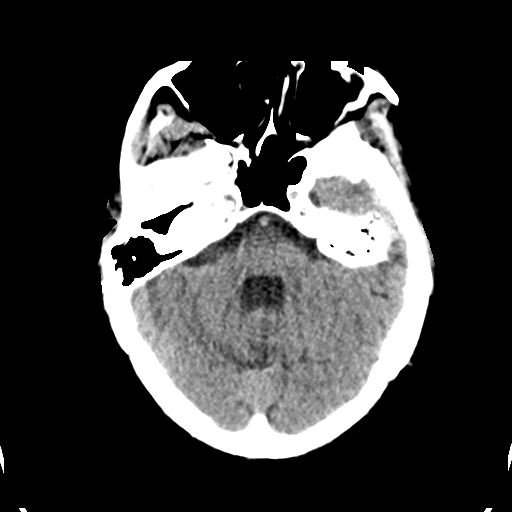
[im 13/35  brain]
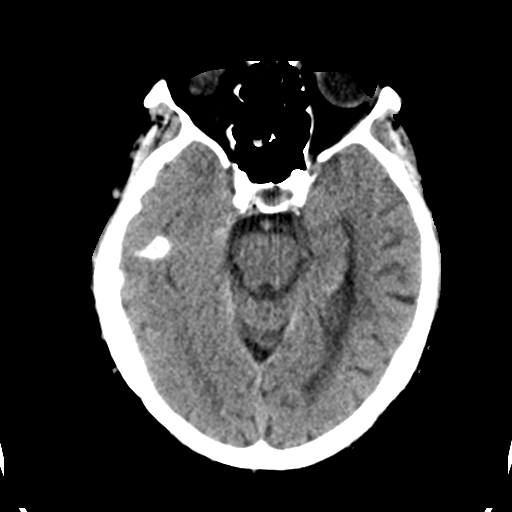
[im 18/35  brain]
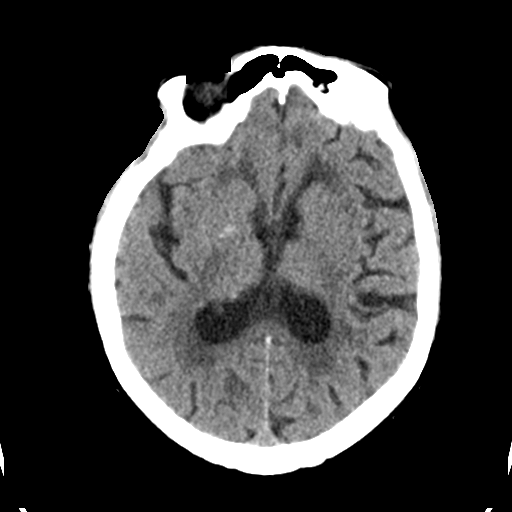
[im 18/35  bone]
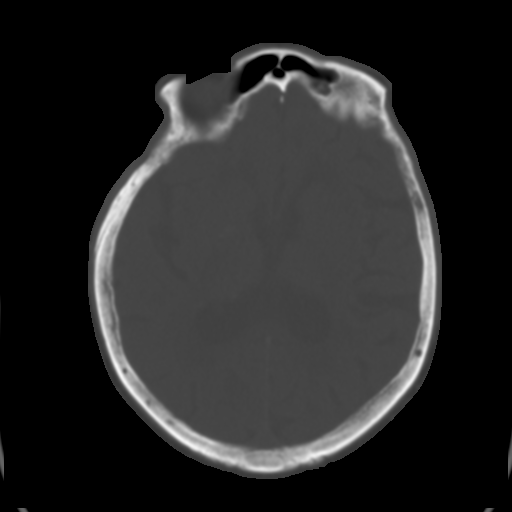
[im 22/35  brain]
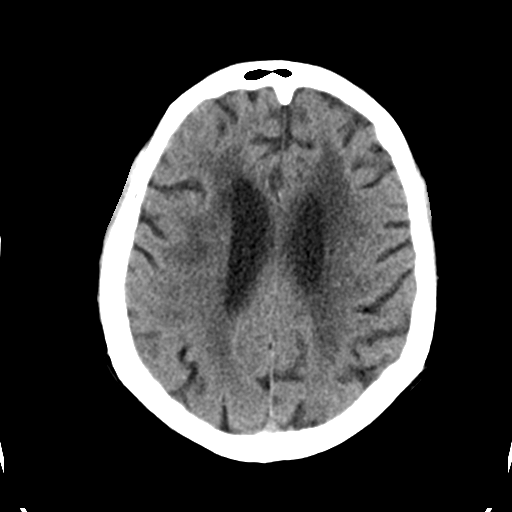
[im 25/35  brain]
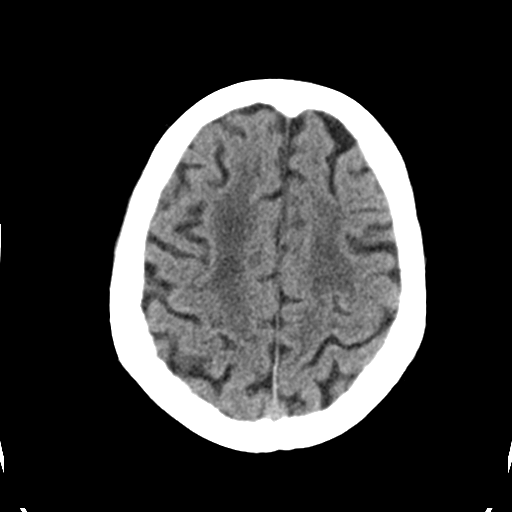
[im 29/35  brain]
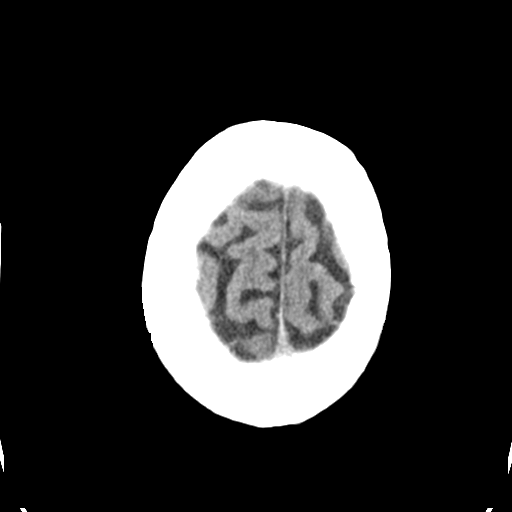
[im 32/35  brain]
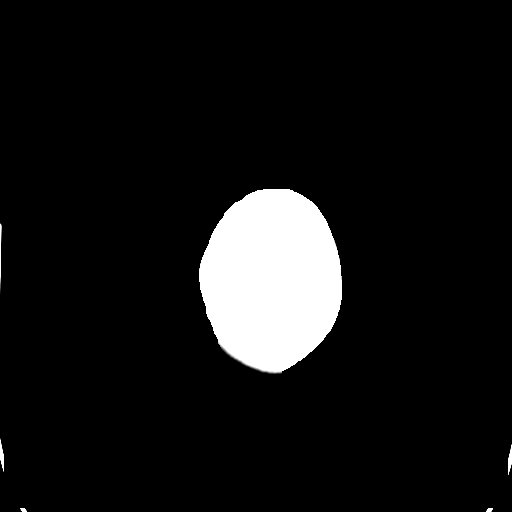
[im 32/35  bone]
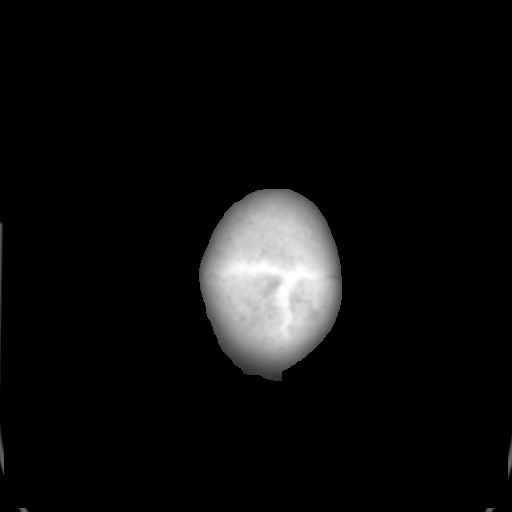

[Series 4: head 3.0 mpr cor · coronal · 0.34mm/px · 3 of 73 slices shown]
[im 25/73  brain]
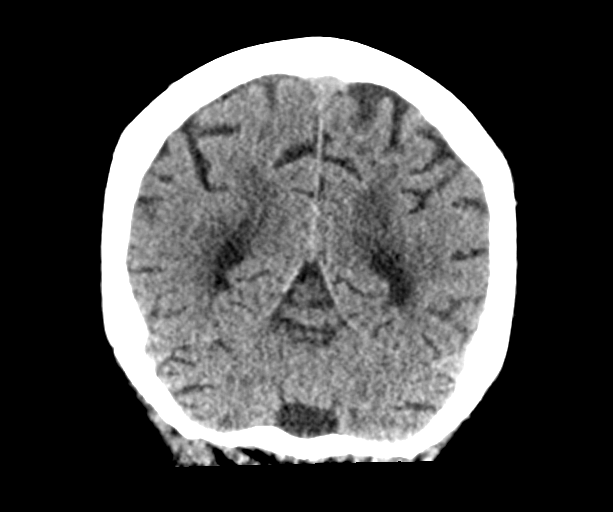
[im 33/73  brain]
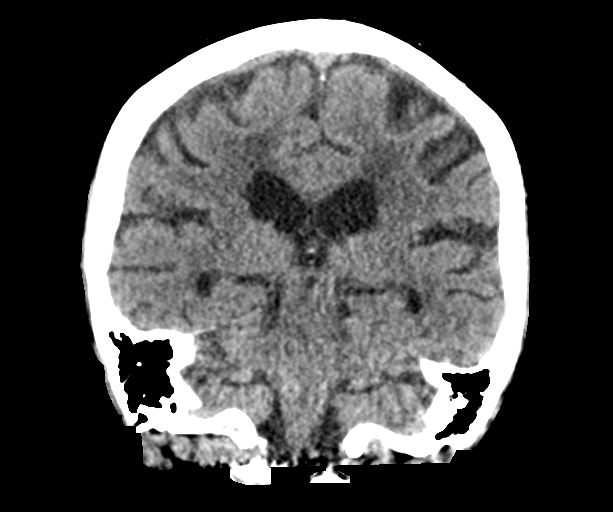
[im 41/73  brain]
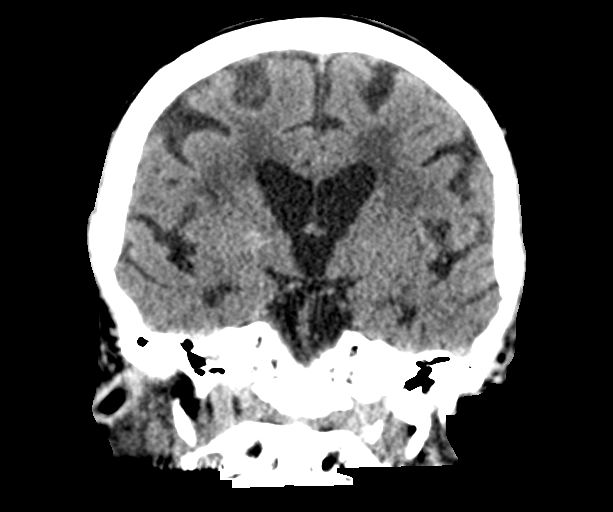

[Series 5: head 3.0 mpr sag · sagittal · 0.34mm/px · 3 of 61 slices shown]
[im 21/61  brain]
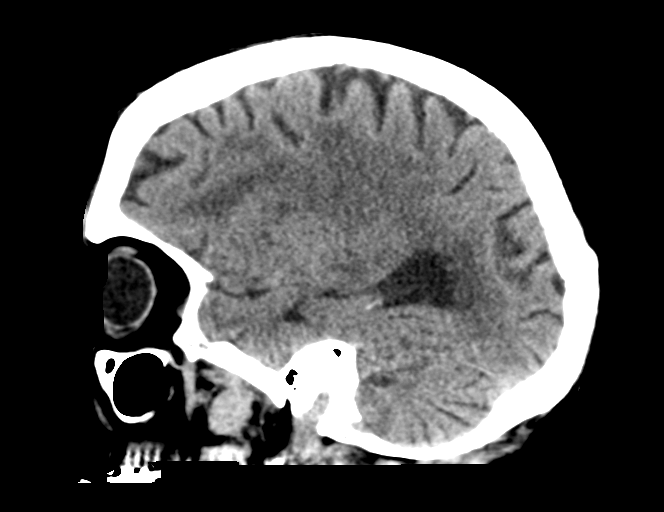
[im 31/61  brain]
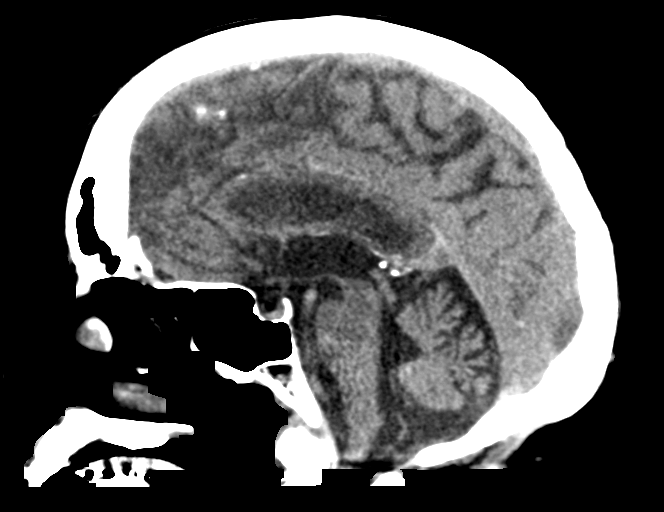
[im 41/61  brain]
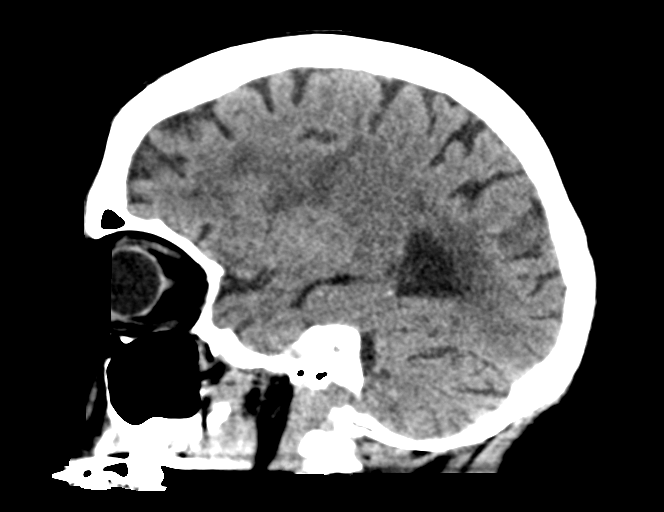

[15 of 47 positions shown; findings below may reference images not displayed]

FINDINGS: Brain: Stable confluent hypodensities throughout the periventricular
white matter consistent with chronic small vessel ischemic changes.
Expected evolution of the left temporal lobe infarct seen on prior
MRI. No acute infarct or hemorrhage. Lateral ventricles and midline
structures are unremarkable. No acute extra-axial fluid collections.
No mass effect.

Vascular: No hyperdense vessel or unexpected calcification.

Skull: Normal. Negative for fracture or focal lesion.

Sinuses/Orbits: No acute finding.

Other: None.
IMPRESSION: 1. Chronic ischemic changes.  No acute intracranial process.

## 2019-11-25 IMAGING — MR MR HEAD W/O CM
12 of 13 series · 44 of 48 positions shown · non-contrast
Comparison: [DATE]

CLINICAL DATA: TIA

EXAM:
MRI HEAD WITHOUT CONTRAST
TECHNIQUE: Multiplanar, multiecho pulse sequences of the brain and surrounding
structures were obtained without intravenous contrast.

[Series 5: DWI · axial · 3.0mm · 0.88mm/px · z∈[-42,+100]mm · 7 of 100 slices shown (1 of 4)]
[im 1/100]
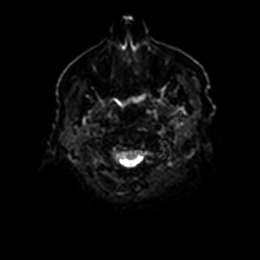
[im 17/100]
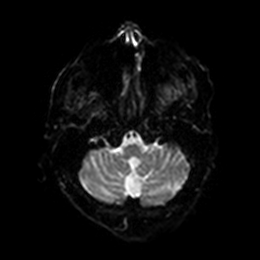
[im 34/100]
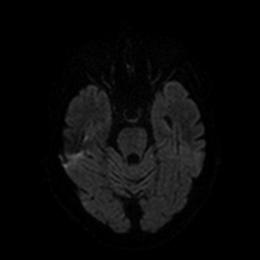
[im 50/100]
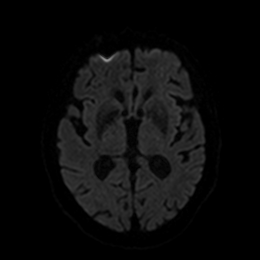
[im 67/100]
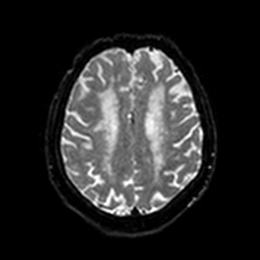
[im 83/100]
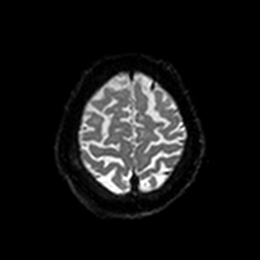
[im 100/100]
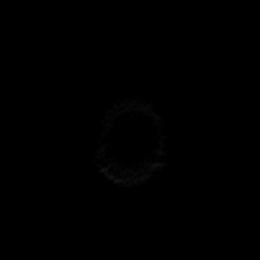

[Series 6: DWI · axial · 3.0mm · 0.88mm/px · z∈[-42,+100]mm · 4 of 50 slices shown (2 of 4)]
[im 1/50]
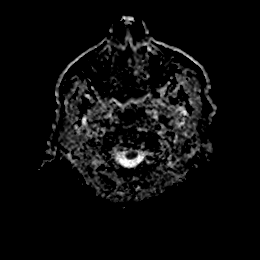
[im 17/50]
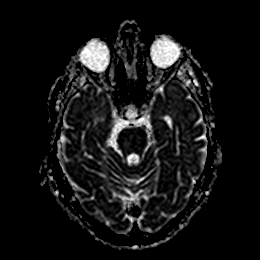
[im 33/50]
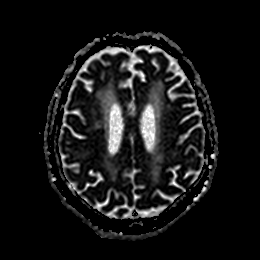
[im 50/50]
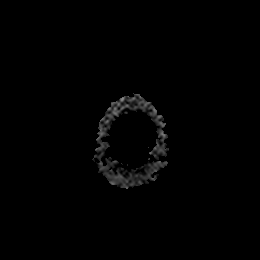

[Series 7: DWI · coronal · 4.0mm · 0.88mm/px · 6 of 76 slices shown (3 of 4)]
[im 1/76]
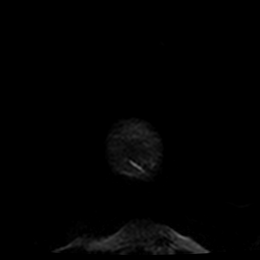
[im 16/76]
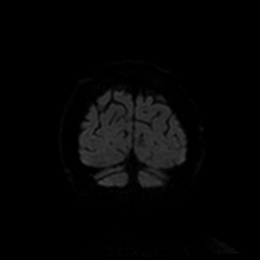
[im 31/76]
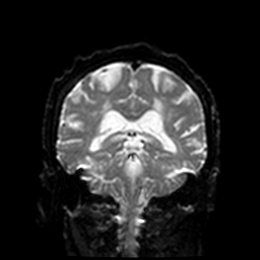
[im 46/76]
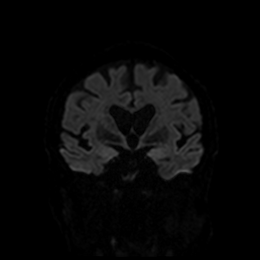
[im 61/76]
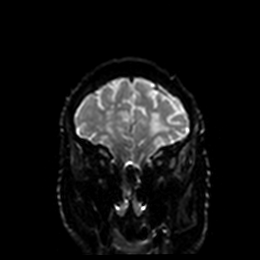
[im 76/76]
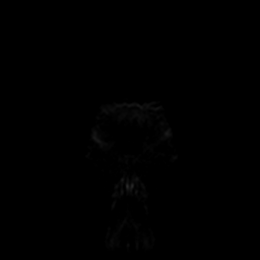

[Series 8: DWI · coronal · 4.0mm · 0.88mm/px · 3 of 38 slices shown (4 of 4)]
[im 1/38]
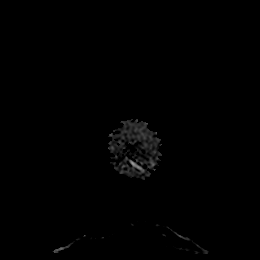
[im 19/38]
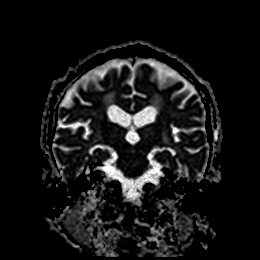
[im 38/38]
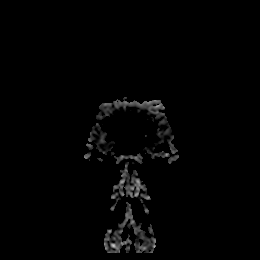

[Series 9: T1 · sagittal · 5.0mm · 0.75mm/px · 2 of 25 slices shown]
[im 1/25]
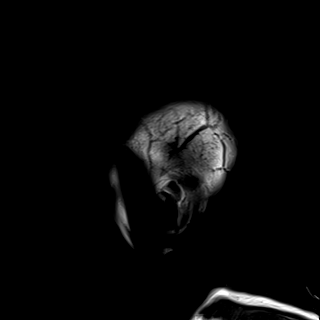
[im 25/25]
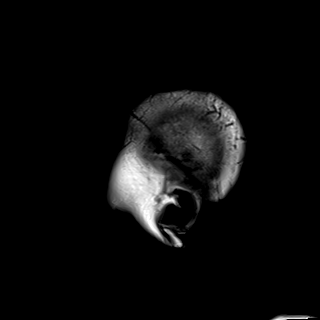

[Series 10: T2 · axial · 5.0mm · 0.90mm/px · z∈[-44,+107]mm · 2 of 27 slices shown (1 of 2)]
[im 1/27]
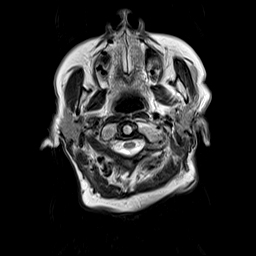
[im 27/27]
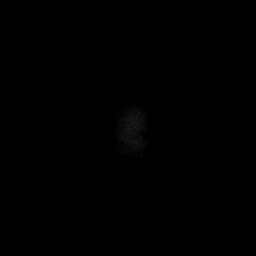

[Series 11: FLAIR · axial · 5.0mm · 0.45mm/px · z∈[-44,+106]mm · 2 of 27 slices shown]
[im 1/27]
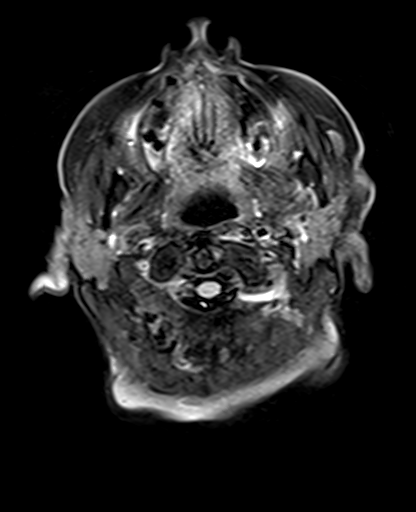
[im 27/27]
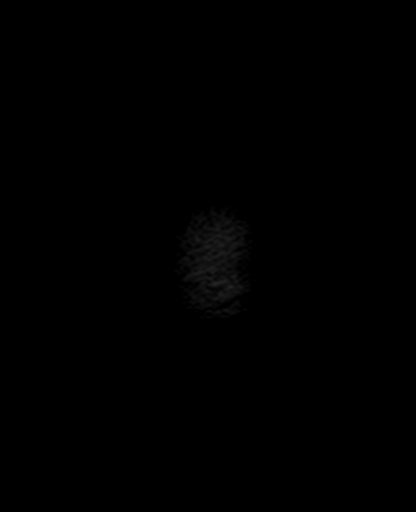

[Series 12: mag_images · axial · 3.0mm · 0.90mm/px · z∈[-38,+98]mm · 4 of 48 slices shown]
[im 1/48]
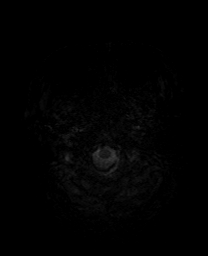
[im 16/48]
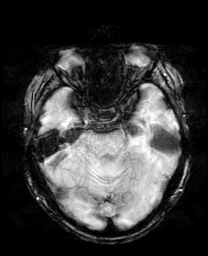
[im 32/48]
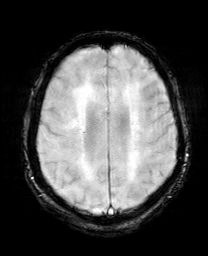
[im 48/48]
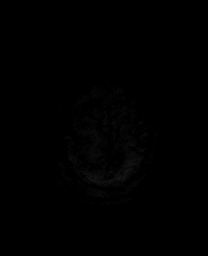

[Series 13: pha_images · axial · 3.0mm · 0.90mm/px · z∈[-38,+98]mm · 4 of 48 slices shown]
[im 1/48]
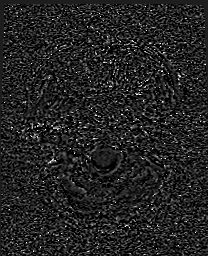
[im 16/48]
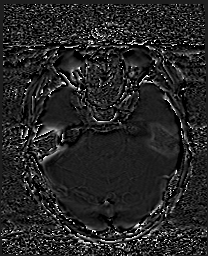
[im 32/48]
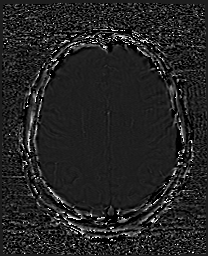
[im 48/48]
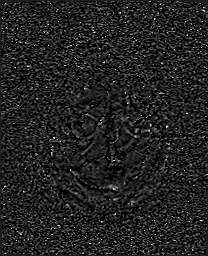

[Series 14: swi_images · axial · 3.0mm · 0.90mm/px · z∈[-38,+98]mm · 4 of 48 slices shown]
[im 1/48]
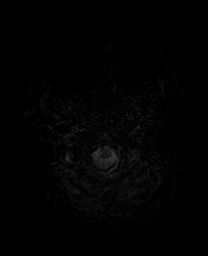
[im 16/48]
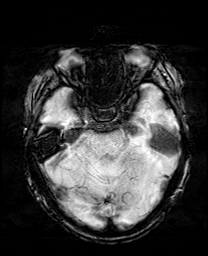
[im 32/48]
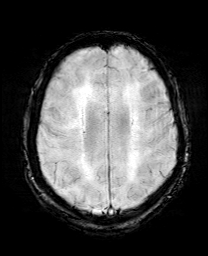
[im 48/48]
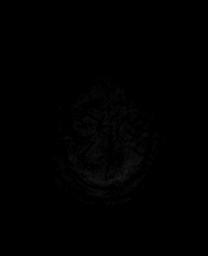

[Series 15: mip_images(sw) · axial · 24.0mm · 0.90mm/px · z∈[-28,+88]mm · 3 of 41 slices shown]
[im 1/41]
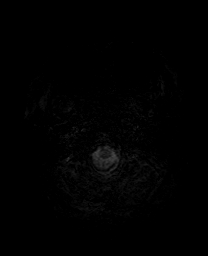
[im 21/41]
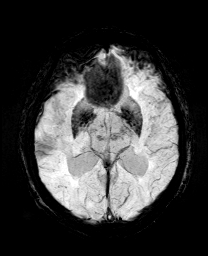
[im 41/41]
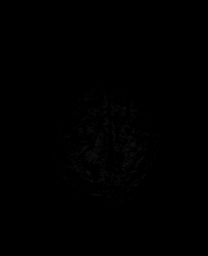

[Series 17: T2 · coronal · 5.0mm · 0.43mm/px · 3 of 32 slices shown (2 of 2)]
[im 1/32]
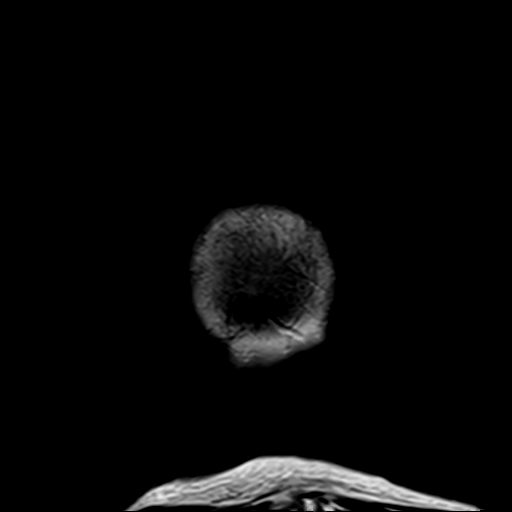
[im 16/32]
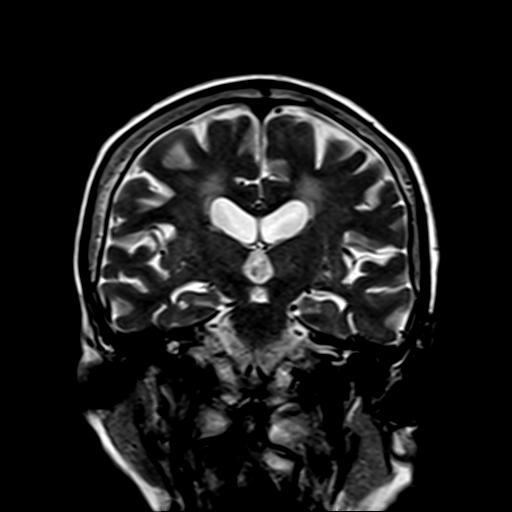
[im 32/32]
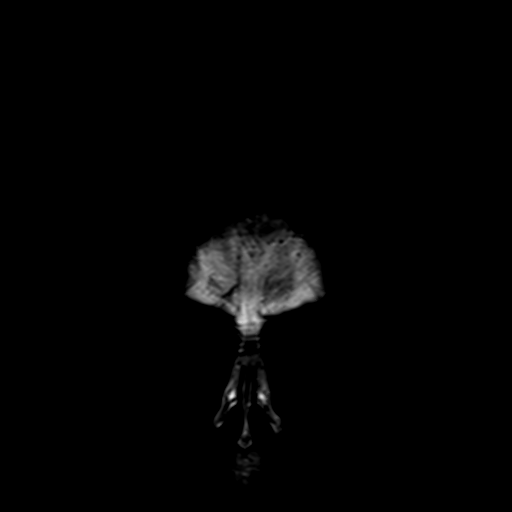

[44 of 48 positions shown; findings below may reference images not displayed]

FINDINGS: Brain: There is no acute infarction or intracranial hemorrhage.
There is no intracranial mass, mass effect, or edema. There is no
hydrocephalus or extra-axial fluid collection.

Prominence of the ventricles and sulci reflects stable parenchymal
volume loss. Confluent areas of T2 hyperintensity in the
supratentorial and pontine white matter are nonspecific but probably
reflect stable advanced chronic microvascular ischemic changes.
Chronic small vessel infarct of the right corona radiata. Focus of
susceptibility is identified in the right cerebellum compatible with
chronic microhemorrhage mineralization.

Vascular: Major vessel flow voids at the skull base are preserved.

Skull and upper cervical spine: Normal marrow signal is preserved.

Sinuses/Orbits: Paranasal sinuses are aerated. No significant
orbital abnormality.

Other: Sella is unremarkable. Minimal patchy left mastoid fluid
opacification.
IMPRESSION: No acute infarction, hemorrhage, or mass. Stable chronic findings
detailed above.

## 2019-11-25 MED ORDER — APIXABAN 2.5 MG PO TABS
2.5000 mg | ORAL_TABLET | Freq: Two times a day (BID) | ORAL | 0 refills | Status: DC
Start: 2019-11-25 — End: 2019-12-22

## 2019-11-25 NOTE — ED Notes (Signed)
MD at bedside. 

## 2019-11-25 NOTE — Discharge Instructions (Addendum)
Recommend starting the Eliquis as discussed.  Stop taking your aspirin.  Please call your primary neurologist to schedule follow-up appointment.  Additionally recommend follow up with your primary doctor.  If she has any additional episodes of TIA, any numbness, weakness, speech or vision changes, return to ER for reassessment.

## 2019-11-25 NOTE — ED Triage Notes (Signed)
Pt here with stroke symptoms. LSW 1830 11/24/2019. Fdept stated L. Sided facial droop and slurred speech. Hx of 2 strokes. Pt takes a 350mg  aspirin. Hx of DM and htn. Symptoms resolved upon arrival to hospital.

## 2019-11-25 NOTE — ED Provider Notes (Signed)
[facility] EMERGENCY DEPARTMENT Provider Note   CSN: [id number] Arrival date & time: 11/25/19  1358     History Chief Complaint  Patient presents with  . Stroke Symptoms    Suzanne Cantu is a 86 y.o. female.  Presented to ER with concern for stroke symptoms.  Patient's daughter at bedside provides majority of history.  States that yesterday she had an episode lasting about 15 minutes where she had slurred speech, left-sided weakness and left-sided facial droop.  Then this afternoon had another similar episode.  Symptoms lasting slightly longer but completely resolved now.  Patient has no acute complaints.  Not having fever or other infectious symptoms.  Denies chest pain or abdominal pain.  Has history of hypertension, diabetes, paroxysmal A. fib Stroke last month, given her PAF was brief, only stroke episode, considered to be high risk for falls, age, decision made to hold anticoagulation, patient discharged full dose aspirin daily.  History obtained via patient, daughter, chart review ,HPI     Past Medical History:  Diagnosis Date  . Diabetes mellitus without complication (HCC)   . Hypertension   . Stroke [facility])     Patient Active Problem List   Diagnosis Date Noted  . History of CVA (cerebrovascular accident) 11/07/2019  . CVA (cerebral vascular accident) (HCC) 10/27/2019  . Encephalopathy acute 07/31/2019  . Hypertensive urgency 07/31/2019  . Essential hypertension 07/31/2019  . Controlled type 2 diabetes mellitus without complication, without long-term current use of insulin (HCC) 07/31/2019  . Chronic back pain 07/31/2019  . Hypertensive emergency 07/31/2019  . Acute encephalopathy 07/31/2019    Past Surgical History:  Procedure Laterality Date  . ABDOMINAL HYSTERECTOMY    . CESAREAN SECTION     4  . CHOLECYSTECTOMY    . KIDNEY STONE SURGERY       OB History   No obstetric history on file.     No family history on file.  Social  History   Tobacco Use  . Smoking status: Never Smoker  . Smokeless tobacco: Never Used  Substance Use Topics  . Alcohol use: Never  . Drug use: Not on file    Home Medications Prior to Admission medications   Medication Sig Start Date End Date Taking? Authorizing Provider  bimatoprost (LUMIGAN) 0.01 % SOLN Place 1 drop into both eyes at bedtime.   Yes [provider]  Cholecalciferol (VITAMIN D-3 PO) Take 1,000 mg by mouth daily after supper.    Yes [provider]  docusate sodium (COLACE) 250 MG capsule Take 250 mg by mouth daily.   Yes [provider]  metFORMIN (GLUCOPHAGE) 500 MG tablet Take 500 mg by mouth 2 (two) times daily with a meal.    Yes [provider]  Multiple Vitamins-Minerals (PRESERVISION AREDS 2) CAPS Take 1 capsule by mouth 2 (two) times daily.   Yes [provider]  valsartan (DIOVAN) 40 MG tablet Take 1 tablet (40 mg total) by mouth daily. 10/29/19 10/28/20 Yes Albertine Grates, MD  vitamin B-12 (CYANOCOBALAMIN) 500 MCG tablet Take 1 tablet (500 mcg total) by mouth daily. 08/02/19  Yes Myrtie Neither, MD  apixaban (ELIQUIS) 2.5 MG TABS tablet Take 1 tablet (2.5 mg total) by mouth 2 (two) times daily. 11/25/19   Milagros Loll, MD    Allergies    Valproic acid and related, Metaxalone, and Peanut-containing drug products  Review of Systems   Review of Systems  Constitutional: Negative for chills and fever.  HENT: Negative  for ear pain and sore throat.   Eyes: Negative for pain and visual disturbance.  Respiratory: Negative for cough and shortness of breath.   Cardiovascular: Negative for chest pain and palpitations.  Gastrointestinal: Negative for abdominal pain and vomiting.  Genitourinary: Negative for dysuria and hematuria.  Musculoskeletal: Negative for arthralgias and back pain.  Skin: Negative for color change and rash.  Neurological: Positive for facial asymmetry, speech difficulty and weakness. Negative for  seizures and syncope.  All other systems reviewed and are negative.   Physical Exam Updated Vital Signs BP (!) 175/88   Pulse 83   Temp 98 F (36.7 C) (Oral)   Resp 15   SpO2 99%   Physical Exam Vitals and nursing note reviewed.  Constitutional:      General: She is not in acute distress.    Appearance: She is well-developed.  HENT:     Head: Normocephalic and atraumatic.  Eyes:     Conjunctiva/sclera: Conjunctivae normal.  Cardiovascular:     Rate and Rhythm: Normal rate and regular rhythm.     Heart sounds: No murmur.  Pulmonary:     Effort: Pulmonary effort is normal. No respiratory distress.     Breath sounds: Normal breath sounds.  Abdominal:     Palpations: Abdomen is soft.     Tenderness: There is no abdominal tenderness.  Musculoskeletal:     Cervical back: Neck supple.  Skin:    General: Skin is warm and dry.  Neurological:     Mental Status: She is alert.     Comments: AAO to person, place, time, cannot recall president but states "he's a liberal" CN 2-12 intact, speech clear visual fields intact 5/5 strength in b/l UE and LE Sensation to light touch intact in b/l UE and LE Normal FNF Normal gait     ED Results / Procedures / Treatments   Labs (all labs ordered are listed, but only abnormal results are displayed) Labs Reviewed  COMPREHENSIVE METABOLIC PANEL - Abnormal; Notable for the following components:      Result Value   Sodium 134 (*)    Chloride 95 (*)    Glucose, Bld 192 (*)    Total Protein 6.3 (*)    Albumin 3.4 (*)    Anion gap 16 (*)    All other components within normal limits  CBC WITH DIFFERENTIAL/PLATELET  URINALYSIS, ROUTINE W REFLEX MICROSCOPIC    EKG None  Radiology CT HEAD WO CONTRAST  Result Date: 11/25/2019 CLINICAL DATA:  Left-sided facial droop, slurred speech, last known well 11/24/2019 at 6:30 p.m. EXAM: CT HEAD WITHOUT CONTRAST TECHNIQUE: Contiguous axial images were obtained from the base of the skull through  the vertex without intravenous contrast. COMPARISON:  10/27/2019 FINDINGS: Brain: Stable confluent hypodensities throughout the periventricular white matter consistent with chronic small vessel ischemic changes. Expected evolution of the left temporal lobe infarct seen on prior MRI. No acute infarct or hemorrhage. Lateral ventricles and midline structures are unremarkable. No acute extra-axial fluid collections. No mass effect. Vascular: No hyperdense vessel or unexpected calcification. Skull: Normal. Negative for fracture or focal lesion. Sinuses/Orbits: No acute finding. Other: None. IMPRESSION: 1. Chronic ischemic changes.  No acute intracranial process. Electronically Signed   By: Sharlet Salina M.D.   On: 11/25/2019 17:12   MR BRAIN WO CONTRAST  Result Date: 11/25/2019 CLINICAL DATA:  TIA EXAM: MRI HEAD WITHOUT CONTRAST TECHNIQUE: Multiplanar, multiecho pulse sequences of the brain and surrounding structures were obtained without intravenous contrast. COMPARISON:  10/27/2019 FINDINGS: Brain: There is no acute infarction or intracranial hemorrhage. There is no intracranial mass, mass effect, or edema. There is no hydrocephalus or extra-axial fluid collection. Prominence of the ventricles and sulci reflects stable parenchymal volume loss. Confluent areas of T2 hyperintensity in the supratentorial and pontine white matter are nonspecific but probably reflect stable advanced chronic microvascular ischemic changes. Chronic small vessel infarct of the right corona radiata. Focus of susceptibility is identified in the right cerebellum compatible with chronic microhemorrhage mineralization. Vascular: Major vessel flow voids at the skull base are preserved. Skull and upper cervical spine: Normal marrow signal is preserved. Sinuses/Orbits: Paranasal sinuses are aerated. No significant orbital abnormality. Other: Sella is unremarkable. Minimal patchy left mastoid fluid opacification. IMPRESSION: No acute infarction,  hemorrhage, or mass. Stable chronic findings detailed above. Electronically Signed   By: Macy Mis M.D.   On: 11/25/2019 18:59    Procedures Procedures (including critical care time)  Medications Ordered in ED Medications - No data to display  ED Course  I have reviewed the triage vital signs and the nursing notes.  Pertinent labs & imaging results that were available during my care of the patient were reviewed by me and considered in my medical decision making (see chart for details).  Clinical Course as of Nov 25 53  Tue Nov 25, 2019  1727 D/w Aroor - he will come eval patient   [RD]  1944 Kirkpatrick at bedside   [RD]    Clinical Course User Index [RD] Lucrezia Starch, MD   MDM Rules/Calculators/A&P                      67-year-old lady presented to ER with concern for TIA.  Symptomatology consistent with TIA.  MRI negative for stroke.  Here remains asymptomatic.  Reviewed case in detail with Dr. Malen Gauze, ultimately Dr. Saralyn Pilar came to bedside to evaluate.  On original discussion with neurology after the first stroke patient was hesitant to start anticoagulation.  Given his recurrent TIAs, neurology strongly recommended starting anticoagulation.  Patient does not have any history of frequent falls, no GI bleed, no brain bleed.  Per Dr. Leonel Ramsay, will start patient on Eliquis, aspirin to be discontinued. Neurology felt patient okay to be discharged and management outpatient setting at this time.  Given patient has no ongoing symptoms, recently admitted for full stroke work-up, believe this is reasonable option.  Patient and daughter are in agreement at bedside.  Reviewed precautions, medication changes in detail, discharged home.    After the discussed management above, the patient was determined to be safe for discharge.  The patient was in agreement with this plan and all questions regarding their care were answered.  ED return precautions were discussed and the patient  will return to the ED with any significant worsening of condition.  Final Clinical Impression(s) / ED Diagnoses Final diagnoses:  TIA (transient ischemic attack)  PAF (paroxysmal atrial fibrillation) ([place])    Rx / DC Orders ED Discharge Orders         Ordered    apixaban (ELIQUIS) 2.5 MG TABS tablet  2 times daily     11/25/19 2017           Lucrezia Starch, MD 11/26/19 [phone]

## 2019-11-25 NOTE — ED Notes (Signed)
Patient transported to CT 

## 2019-11-25 NOTE — Consult Note (Signed)
Neurology Consultation Reason for Consult: TIA Referring Physician: Roslynn Amble, R  CC: Left-sided weakness  History is obtained from: Patient, daughter  HPI: Suzanne Cantu is a 86 y.o. female with history of hypertension, diabetes, atrial fibrillation not on anticoagulation who presents with left-sided weakness yesterday and a recurrent episode of decreased responsiveness today.  She was admitted to the hospital about a month ago with embolic appearing infarct and at the time there was a discussion about anticoagulation versus antiplatelet therapy given that she does pose some degree of fall risk.   There has also been some concern for seizures in the past, and she was started on empiric Depakote for a while but due to sedation this was discontinued.  She has not had any clear episodes of clonic activity.      ROS: A 14 point ROS was performed and is negative except as noted in the HPI.   Past Medical History:  Diagnosis Date  . Diabetes mellitus without complication ([place])   . Hypertension   . Stroke [facility])      No family history on file.   Social History:  reports that she has never smoked. She has never used smokeless tobacco. She reports that she does not drink alcohol. No history on file for drug.   Exam: Current vital signs: BP (!) 169/80   Pulse 83   Temp 98.1 F (36.7 C) (Oral)   Resp 16   SpO2 97%  Vital signs in last 24 hours: Temp:  [98.1 F (36.7 C)] 98.1 F (36.7 C) (05/25 1420) Pulse Rate:  [82-87] 83 (05/25 1645) Resp:  [16-23] 16 (05/25 1645) BP: (138-172)/(71-84) 169/80 (05/25 1645) SpO2:  [97 %-99 %] 97 % (05/25 1645)   Physical Exam  Constitutional: Appears well-developed and well-nourished.  Psych: Affect appropriate to situation Eyes: No scleral injection HENT: No OP obstrucion MSK: no joint deformities.  Cardiovascular: Normal rate and regular rhythm.  Respiratory: Effort normal, non-labored breathing GI: Soft.  No distension. There is  no tenderness.  Skin: WDI  Neuro: Mental Status: Patient is awake, alert, oriented to person, place, month, year, and situation. Patient is able to give a clear and coherent history. No signs of aphasia or neglect Cranial Nerves: II: Visual Fields are full. Pupils are equal, round, and reactive to light.   III,IV, VI: EOMI without ptosis or diploplia.  V: Facial sensation is symmetric to temperature VII: Facial movement with minimal left sided weakness VIII: hearing is intact to voice X: Uvula elevates symmetrically XI: Shoulder shrug is symmetric. XII: tongue is midline without atrophy or fasciculations.  Motor: Tone is normal. Bulk is normal. 5/5 strength was present on the right, 4+/5 left arm and leg.  Sensory: Sensation is slightly diminished on the left  Cerebellar: No clear ataxia   I have reviewed labs in epic and the results pertinent to this consultation are: Cr 0.81  I have reviewed the images obtained:MRI - no acute findings.   Impression: 86 year old female with recurrent episodes of concern.  The episode that the daughter captured on video from yesterday does appear to be a significant worsening of in her left-sided weakness which could be consistent with TIA.  Given that she frequently has her left side affected, I think that seizures still could be theoretically in differential but given that we know she had an embolic infarct within the past month, we know that she has the ability to form clots and symptoms distally.  In the setting, I think that  anticoagulation is indicated.  I discussed that with the daughter today, and she has not fallen in more than a month.  She does not have any recent GI bleeds, no history of nosebleeds.  I discussed with the daughter and patient, that she has significant risk of recurrent ischemic stroke especially since she is now having multiple recurrent episodes concerning for TIAs.  In this setting, though there is some risk of starting  anticoagulation, I feel that she likely could benefit as well.  After discussing this with the patient and her daughter, they agree to start Eliquis.  Recommendations: 1) Eliquis 2.5 mg twice daily(age greater than 80 and weight less than 60 kg) 2) discontinue aspirin  3) follow-up with outpatient neurology.   Ritta Slot, MD [facility] [phone]  If 7pm- 7am, please page neurology on call as listed in [facility].

## 2019-11-27 ENCOUNTER — Other Ambulatory Visit: Payer: Self-pay | Admitting: *Deleted

## 2019-11-27 MED ORDER — VALSARTAN 40 MG PO TABS: 40.0000 mg | ORAL_TABLET | Freq: Every day | ORAL | 0 refills | Status: DC

## 2019-12-15 ENCOUNTER — Encounter: Payer: Self-pay | Admitting: Internal Medicine

## 2019-12-15 ENCOUNTER — Ambulatory Visit (INDEPENDENT_AMBULATORY_CARE_PROVIDER_SITE_OTHER): Payer: Medicare Other | Admitting: Internal Medicine

## 2019-12-15 ENCOUNTER — Other Ambulatory Visit: Payer: Self-pay

## 2019-12-15 VITALS — BP 155/81 | HR 95 | Ht 62.0 in | Wt 111.7 lb

## 2019-12-15 DIAGNOSIS — M5440 Lumbago with sciatica, unspecified side: Secondary | ICD-10-CM

## 2019-12-15 DIAGNOSIS — G8929 Other chronic pain: Secondary | ICD-10-CM

## 2019-12-15 DIAGNOSIS — I10 Essential (primary) hypertension: Secondary | ICD-10-CM | POA: Diagnosis not present

## 2019-12-15 DIAGNOSIS — R569 Unspecified convulsions: Secondary | ICD-10-CM | POA: Insufficient documentation

## 2019-12-15 DIAGNOSIS — E119 Type 2 diabetes mellitus without complications: Secondary | ICD-10-CM

## 2019-12-15 NOTE — Assessment & Plan Note (Signed)
Last seizure  7 monts  ago

## 2019-12-15 NOTE — Assessment & Plan Note (Signed)
Stable now. 

## 2019-12-15 NOTE — Assessment & Plan Note (Signed)
stable °

## 2019-12-15 NOTE — Progress Notes (Signed)
Established Patient Office Visit  SUBJECTIVE:  Patient ID: Suzanne Cantu, female    DOB: 1926/08/13  Age: 86 y.o. MRN: [id number]  CC: No chief complaint on file.   HPI Suzanne Cantu presents for check up  Past Medical History:  Diagnosis Date  . Diabetes mellitus without complication (HCC)   . Hypertension   . Stroke [facility])     Past Surgical History:  Procedure Laterality Date  . ABDOMINAL HYSTERECTOMY    . CESAREAN SECTION     4  . CHOLECYSTECTOMY    . KIDNEY STONE SURGERY      No family history on file.  Social History   Socioeconomic History  . Marital status: Widowed    Spouse name: Not on file  . Number of children: Not on file  . Years of education: Not on file  . Highest education level: Not on file  Occupational History  . Not on file  Tobacco Use  . Smoking status: Never Smoker  . Smokeless tobacco: Never Used  Substance and Sexual Activity  . Alcohol use: Never  . Drug use: Not on file  . Sexual activity: Not on file  Other Topics Concern  . Not on file  Social History Narrative  . Not on file   Social Determinants of Health   Financial Resource Strain:   . Difficulty of Paying Living Expenses:   Food Insecurity:   . Worried About Programme researcher, broadcasting/film/video in the Last Year:   . Barista in the Last Year:   Transportation Needs:   . Freight forwarder (Medical):   Marland Kitchen Lack of Transportation (Non-Medical):   Physical Activity:   . Days of Exercise per Week:   . Minutes of Exercise per Session:   Stress:   . Feeling of Stress :   Social Connections:   . Frequency of Communication with Friends and Family:   . Frequency of Social Gatherings with Friends and Family:   . Attends Religious Services:   . Active Member of Clubs or Organizations:   . Attends Banker Meetings:   Marland Kitchen Marital Status:   Intimate Partner Violence:   . Fear of Current or Ex-Partner:   . Emotionally Abused:   Marland Kitchen Physically Abused:   .  Sexually Abused:      Current Outpatient Medications:  .  apixaban (ELIQUIS) 2.5 MG TABS tablet, Take 1 tablet (2.5 mg total) by mouth 2 (two) times daily., Disp: 60 tablet, Rfl: 0 .  bimatoprost (LUMIGAN) 0.01 % SOLN, Place 1 drop into both eyes at bedtime., Disp: , Rfl:  .  Cholecalciferol (VITAMIN D-3 PO), Take 1,000 mg by mouth daily after supper. , Disp: , Rfl:  .  docusate sodium (COLACE) 250 MG capsule, Take 250 mg by mouth daily., Disp: , Rfl:  .  metFORMIN (GLUCOPHAGE) 500 MG tablet, Take 500 mg by mouth 2 (two) times daily with a meal. , Disp: , Rfl:  .  Multiple Vitamins-Minerals (PRESERVISION AREDS 2) CAPS, Take 1 capsule by mouth 2 (two) times daily., Disp: , Rfl:  .  valsartan (DIOVAN) 40 MG tablet, Take 1 tablet (40 mg total) by mouth daily., Disp: 30 tablet, Rfl: 0 .  vitamin B-12 (CYANOCOBALAMIN) 500 MCG tablet, Take 1 tablet (500 mcg total) by mouth daily., Disp: 30 tablet, Rfl: 0   Allergies  Allergen Reactions  . Valproic Acid And Related Other (See Comments)    Low blood pressure, weakness.  Remus Blake  Other (See Comments)    "It made me not feel right"  . Peanut-Containing Drug Products Itching    ROs   Review of Systems  Constitutional: Negative for chills, diaphoresis, fever and malaise/fatigue.  HENT: Negative for congestion, ear pain and sore throat.   Respiratory: Negative for cough, shortness of breath, wheezing Cardiovascular: Negative for chest pain, palpitations, peripheral edema, orthopnea, PND Gastrointestinal: Negative for abdominal pain, constipation, diarrhea, nausea and vomiting.  Genitourinary: Negative for dysuria, incontinence, hematuria  Musculoskeletal: Negative for myalgias. Negative for arthralgias. Skin: Negative for rashes or pruritus Neurological: Negative for dizziness, syncope, headaches, focal weakness, altered sensation  Psychiatric/Behavioral: Negative for depression and anxiety.        OBJECTIVE:    Constitutional: The  patient is oriented to person, place, and time. Pt appears well-developed and well-nourished.  Head: Normocephalic and atraumatic.  Eyes: Pupils are equal, round, and reactive to light.  Neck: No JVD present. No tracheal deviation present. No thyromegaly present.  Cardiovascular: Regular rate and rhythm. No gallop. Pulmonary/Chest: Normal breath sounds. Lungs clear to auscultation. Abdominal: No abdominal tenderness. No guarding or rebound tenderness. No hepatosplenomegaly. Musculoskeletal: Normal range of motion.  Lymphatic: No cervical adenopathy.  Neurological: No cranial nerve deficit.  Skin: Skin is warm and hydrated.  Psychiatric: The patient has a normal mood and affect.  There were no vitals taken for this visit. Wt Readings from Last 3 Encounters:  10/30/19 112 lb (50.8 kg)  10/27/19 112 lb 14 oz (51.2 kg)  08/06/19 118 lb 9.7 oz (53.8 kg)    Health Maintenance Due  Topic Date Due  . FOOT EXAM  Never done  . OPHTHALMOLOGY EXAM  Never done  . COVID-19 Vaccine (1) Never done  . DEXA SCAN  Never done  . PNA vac Low Risk Adult (1 of 2 - PCV13) Never done    There are no preventive care reminders to display for this patient.  CBC Latest Ref Rng & Units 11/25/2019 10/29/2019 10/27/2019  WBC 4.0 - 10.5 K/uL 6.4 5.1 8.1  Hemoglobin 12.0 - 15.0 g/dL 12.6 12.7 11.8(L)  Hematocrit 36 - 46 % 38.3 37.3 36.0  Platelets 150 - 400 K/uL 202 191 194   CMP Latest Ref Rng & Units 11/25/2019 10/29/2019 10/27/2019  Glucose 70 - 99 mg/dL 192(H) 128(H) 153(H)  BUN 8 - 23 mg/dL 10 <5(L) 6(L)  Creatinine 0.44 - 1.00 mg/dL 0.81 0.60 0.52  Sodium 135 - 145 mmol/L 134(L) 137 133(L)  Potassium 3.5 - 5.1 mmol/L 4.1 3.8 3.4(L)  Chloride 98 - 111 mmol/L 95(L) 102 99  CO2 22 - 32 mmol/L 23 25 20(L)  Calcium 8.9 - 10.3 mg/dL 9.9 9.6 8.2(L)  Total Protein 6.5 - 8.1 g/dL 6.3(L) - 5.5(L)  Total Bilirubin 0.3 - 1.2 mg/dL 0.6 - 0.4  Alkaline Phos 38 - 126 U/L 57 - 49  AST 15 - 41 U/L 21 - 18  ALT 0 - 44  U/L 13 - 12    Lab Results  Component Value Date   TSH 2.468 08/01/2019   Lab Results  Component Value Date   ALBUMIN 3.4 (L) 11/25/2019   ANIONGAP 16 (H) 11/25/2019   Lab Results  Component Value Date   CHOL 185 10/28/2019   HDL 51 10/28/2019   LDLCALC 116 (H) 10/28/2019   CHOLHDL 3.6 10/28/2019   Lab Results  Component Value Date   TRIG 90 10/28/2019   Lab Results  Component Value Date   HGBA1C 6.5 (H) 10/28/2019  HGBA1C 6.6 (H) 07/31/2019      ASSESSMENT & PLAN:   Problem List Items Addressed This Visit    None      No orders of the defined types were placed in this encounter.  1. Essential hypertension stable  2. Controlled type 2 diabetes mellitus without complication, without long-term current use of insulin (HCC) bs 150  3. Chronic low back pain with sciatica, sciatica laterality unspecified, unspecified back pain laterality stable  4. Seizure (HCC) No recurrence Follow-up: No follow-ups on file.    Dr. Woodroe Chen [facility] [address] [place], [place] [address]   By signing my name below, I, [employer], attest that this documentation has been prepared under the direction of Corky Downs, MD. Electronically Signed: Doneta Public 12/15/19, 9:09 AM    I personally performed the services described in this documentation, which was SCRIBED in my presence. The recorded information has been reviewed and considered accurate. It has been edited as necessary during review. Corky Downs, MD

## 2019-12-15 NOTE — Assessment & Plan Note (Signed)
BS 150

## 2019-12-19 ENCOUNTER — Other Ambulatory Visit: Payer: Self-pay | Admitting: Internal Medicine

## 2019-12-19 ENCOUNTER — Other Ambulatory Visit: Payer: Self-pay | Admitting: *Deleted

## 2019-12-19 MED ORDER — CLOPIDOGREL BISULFATE 75 MG PO TABS
75.0000 mg | ORAL_TABLET | Freq: Every day | ORAL | 3 refills | Status: DC
Start: 2019-12-19 — End: 2020-05-01

## 2019-12-22 ENCOUNTER — Other Ambulatory Visit: Payer: Self-pay | Admitting: *Deleted

## 2019-12-22 MED ORDER — APIXABAN 2.5 MG PO TABS: 2.5000 mg | ORAL_TABLET | Freq: Two times a day (BID) | ORAL | 6 refills | Status: DC

## 2019-12-23 ENCOUNTER — Other Ambulatory Visit: Payer: Self-pay | Admitting: *Deleted

## 2019-12-23 MED ORDER — APIXABAN 2.5 MG PO TABS: 2.5000 mg | ORAL_TABLET | Freq: Two times a day (BID) | ORAL | 6 refills | Status: DC

## 2020-01-14 ENCOUNTER — Other Ambulatory Visit: Payer: Self-pay | Admitting: Internal Medicine

## 2020-02-16 ENCOUNTER — Ambulatory Visit: Payer: Medicare Other | Admitting: Internal Medicine

## 2020-02-19 ENCOUNTER — Other Ambulatory Visit: Payer: Self-pay | Admitting: *Deleted

## 2020-02-19 MED ORDER — METFORMIN HCL 500 MG PO TABS: 500.0000 mg | ORAL_TABLET | Freq: Two times a day (BID) | ORAL | 3 refills | Status: DC

## 2020-02-27 ENCOUNTER — Ambulatory Visit: Payer: Medicare Other | Admitting: Internal Medicine

## 2020-03-15 ENCOUNTER — Ambulatory Visit: Payer: Medicare Other | Admitting: Internal Medicine

## 2020-03-24 ENCOUNTER — Ambulatory Visit: Payer: Medicare Other | Admitting: Internal Medicine

## 2020-04-21 ENCOUNTER — Other Ambulatory Visit: Payer: Self-pay

## 2020-04-21 ENCOUNTER — Inpatient Hospital Stay
Admission: EM | Admit: 2020-04-21 | Discharge: 2020-05-01 | DRG: 480 | Disposition: A | Discharge status: Skilled Nursing Facility | Payer: Medicare Other | Attending: Internal Medicine | Admitting: Internal Medicine

## 2020-04-21 ENCOUNTER — Emergency Department: Payer: Medicare Other

## 2020-04-21 DIAGNOSIS — Z87442 Personal history of urinary calculi: Secondary | ICD-10-CM | POA: Diagnosis not present

## 2020-04-21 DIAGNOSIS — Y92009 Unspecified place in unspecified non-institutional (private) residence as the place of occurrence of the external cause: Secondary | ICD-10-CM | POA: Diagnosis not present

## 2020-04-21 DIAGNOSIS — W1830XA Fall on same level, unspecified, initial encounter: Secondary | ICD-10-CM | POA: Diagnosis present

## 2020-04-21 DIAGNOSIS — I482 Chronic atrial fibrillation, unspecified: Secondary | ICD-10-CM | POA: Diagnosis present

## 2020-04-21 DIAGNOSIS — Z79899 Other long term (current) drug therapy: Secondary | ICD-10-CM

## 2020-04-21 DIAGNOSIS — Z7984 Long term (current) use of oral hypoglycemic drugs: Secondary | ICD-10-CM

## 2020-04-21 DIAGNOSIS — Z20822 Contact with and (suspected) exposure to covid-19: Secondary | ICD-10-CM | POA: Diagnosis present

## 2020-04-21 DIAGNOSIS — I10 Essential (primary) hypertension: Secondary | ICD-10-CM | POA: Diagnosis present

## 2020-04-21 DIAGNOSIS — W19XXXA Unspecified fall, initial encounter: Secondary | ICD-10-CM

## 2020-04-21 DIAGNOSIS — Z9049 Acquired absence of other specified parts of digestive tract: Secondary | ICD-10-CM | POA: Diagnosis not present

## 2020-04-21 DIAGNOSIS — E43 Unspecified severe protein-calorie malnutrition: Secondary | ICD-10-CM | POA: Diagnosis present

## 2020-04-21 DIAGNOSIS — Z833 Family history of diabetes mellitus: Secondary | ICD-10-CM

## 2020-04-21 DIAGNOSIS — R569 Unspecified convulsions: Secondary | ICD-10-CM | POA: Diagnosis present

## 2020-04-21 DIAGNOSIS — Z7902 Long term (current) use of antithrombotics/antiplatelets: Secondary | ICD-10-CM

## 2020-04-21 DIAGNOSIS — E119 Type 2 diabetes mellitus without complications: Secondary | ICD-10-CM | POA: Diagnosis not present

## 2020-04-21 DIAGNOSIS — S72141A Displaced intertrochanteric fracture of right femur, initial encounter for closed fracture: Secondary | ICD-10-CM | POA: Diagnosis present

## 2020-04-21 DIAGNOSIS — F0150 Vascular dementia without behavioral disturbance: Secondary | ICD-10-CM | POA: Diagnosis present

## 2020-04-21 DIAGNOSIS — M25551 Pain in right hip: Secondary | ICD-10-CM

## 2020-04-21 DIAGNOSIS — Z66 Do not resuscitate: Secondary | ICD-10-CM | POA: Diagnosis present

## 2020-04-21 DIAGNOSIS — R339 Retention of urine, unspecified: Secondary | ICD-10-CM | POA: Diagnosis not present

## 2020-04-21 DIAGNOSIS — I160 Hypertensive urgency: Secondary | ICD-10-CM

## 2020-04-21 DIAGNOSIS — Z90710 Acquired absence of both cervix and uterus: Secondary | ICD-10-CM

## 2020-04-21 DIAGNOSIS — Z7901 Long term (current) use of anticoagulants: Secondary | ICD-10-CM

## 2020-04-21 DIAGNOSIS — S72001A Fracture of unspecified part of neck of right femur, initial encounter for closed fracture: Secondary | ICD-10-CM | POA: Diagnosis present

## 2020-04-21 DIAGNOSIS — E785 Hyperlipidemia, unspecified: Secondary | ICD-10-CM | POA: Diagnosis present

## 2020-04-21 DIAGNOSIS — R627 Adult failure to thrive: Secondary | ICD-10-CM | POA: Diagnosis present

## 2020-04-21 DIAGNOSIS — Z8673 Personal history of transient ischemic attack (TIA), and cerebral infarction without residual deficits: Secondary | ICD-10-CM

## 2020-04-21 DIAGNOSIS — E1165 Type 2 diabetes mellitus with hyperglycemia: Secondary | ICD-10-CM | POA: Diagnosis present

## 2020-04-21 DIAGNOSIS — Z888 Allergy status to other drugs, medicaments and biological substances status: Secondary | ICD-10-CM

## 2020-04-21 DIAGNOSIS — K5900 Constipation, unspecified: Secondary | ICD-10-CM | POA: Diagnosis present

## 2020-04-21 DIAGNOSIS — Z91010 Allergy to peanuts: Secondary | ICD-10-CM

## 2020-04-21 DIAGNOSIS — D6489 Other specified anemias: Secondary | ICD-10-CM | POA: Diagnosis not present

## 2020-04-21 DIAGNOSIS — S72009A Fracture of unspecified part of neck of unspecified femur, initial encounter for closed fracture: Secondary | ICD-10-CM

## 2020-04-21 LAB — CBC WITH DIFFERENTIAL/PLATELET
Abs Immature Granulocytes: 0.06 10*3/uL (ref 0.00–0.07)
Basophils Absolute: 0 10*3/uL (ref 0.0–0.1)
Basophils Relative: 0 %
Eosinophils Absolute: 0 10*3/uL (ref 0.0–0.5)
Eosinophils Relative: 0 %
HCT: 37 % (ref 36.0–46.0)
Hemoglobin: 12.5 g/dL (ref 12.0–15.0)
Immature Granulocytes: 1 %
Lymphocytes Relative: 11 %
Lymphs Abs: 1 10*3/uL (ref 0.7–4.0)
MCH: 30.2 pg (ref 26.0–34.0)
MCHC: 33.8 g/dL (ref 30.0–36.0)
MCV: 89.4 fL (ref 80.0–100.0)
Monocytes Absolute: 0.6 10*3/uL (ref 0.1–1.0)
Monocytes Relative: 6 %
Neutro Abs: 7.9 10*3/uL — ABNORMAL HIGH (ref 1.7–7.7)
Neutrophils Relative %: 82 %
Platelets: 202 10*3/uL (ref 150–400)
RBC: 4.14 MIL/uL (ref 3.87–5.11)
RDW: 12.7 % (ref 11.5–15.5)
WBC: 9.6 10*3/uL (ref 4.0–10.5)
nRBC: 0 % (ref 0.0–0.2)

## 2020-04-21 LAB — BASIC METABOLIC PANEL
Anion gap: 11 (ref 5–15)
BUN: 7 mg/dL — ABNORMAL LOW (ref 8–23)
CO2: 27 mmol/L (ref 22–32)
Calcium: 9.5 mg/dL (ref 8.9–10.3)
Chloride: 100 mmol/L (ref 98–111)
Creatinine, Ser: 0.69 mg/dL (ref 0.44–1.00)
GFR, Estimated: >60 mL/min (ref >60)
Glucose, Bld: 196 mg/dL — ABNORMAL HIGH (ref 70–99)
Potassium: 3.8 mmol/L (ref 3.5–5.1)
Sodium: 138 mmol/L (ref 135–145)

## 2020-04-21 LAB — PROTIME-INR
INR: 1.1 (ref 0.8–1.2)
Prothrombin Time: 14 seconds (ref 11.4–15.2)

## 2020-04-21 LAB — RESPIRATORY PANEL BY RT PCR (FLU A&B, COVID)
Influenza A by PCR: NEGATIVE
Influenza B by PCR: NEGATIVE
SARS Coronavirus 2 by RT PCR: NEGATIVE

## 2020-04-21 LAB — GLUCOSE, CAPILLARY
Glucose-Capillary: 162 mg/dL — ABNORMAL HIGH (ref 70–99)
Glucose-Capillary: 215 mg/dL — ABNORMAL HIGH (ref 70–99)
Glucose-Capillary: 189 mg/dL — ABNORMAL HIGH (ref 70–99)

## 2020-04-21 LAB — APTT: aPTT: 34 seconds (ref 24–36)

## 2020-04-21 IMAGING — CR DG CHEST 1V
1 series · 1 of 1 positions shown · non-contrast
Comparison: None.

CLINICAL DATA: Right hip fracture.

EXAM:
CHEST  1 VIEW

[dg chest 1 view]
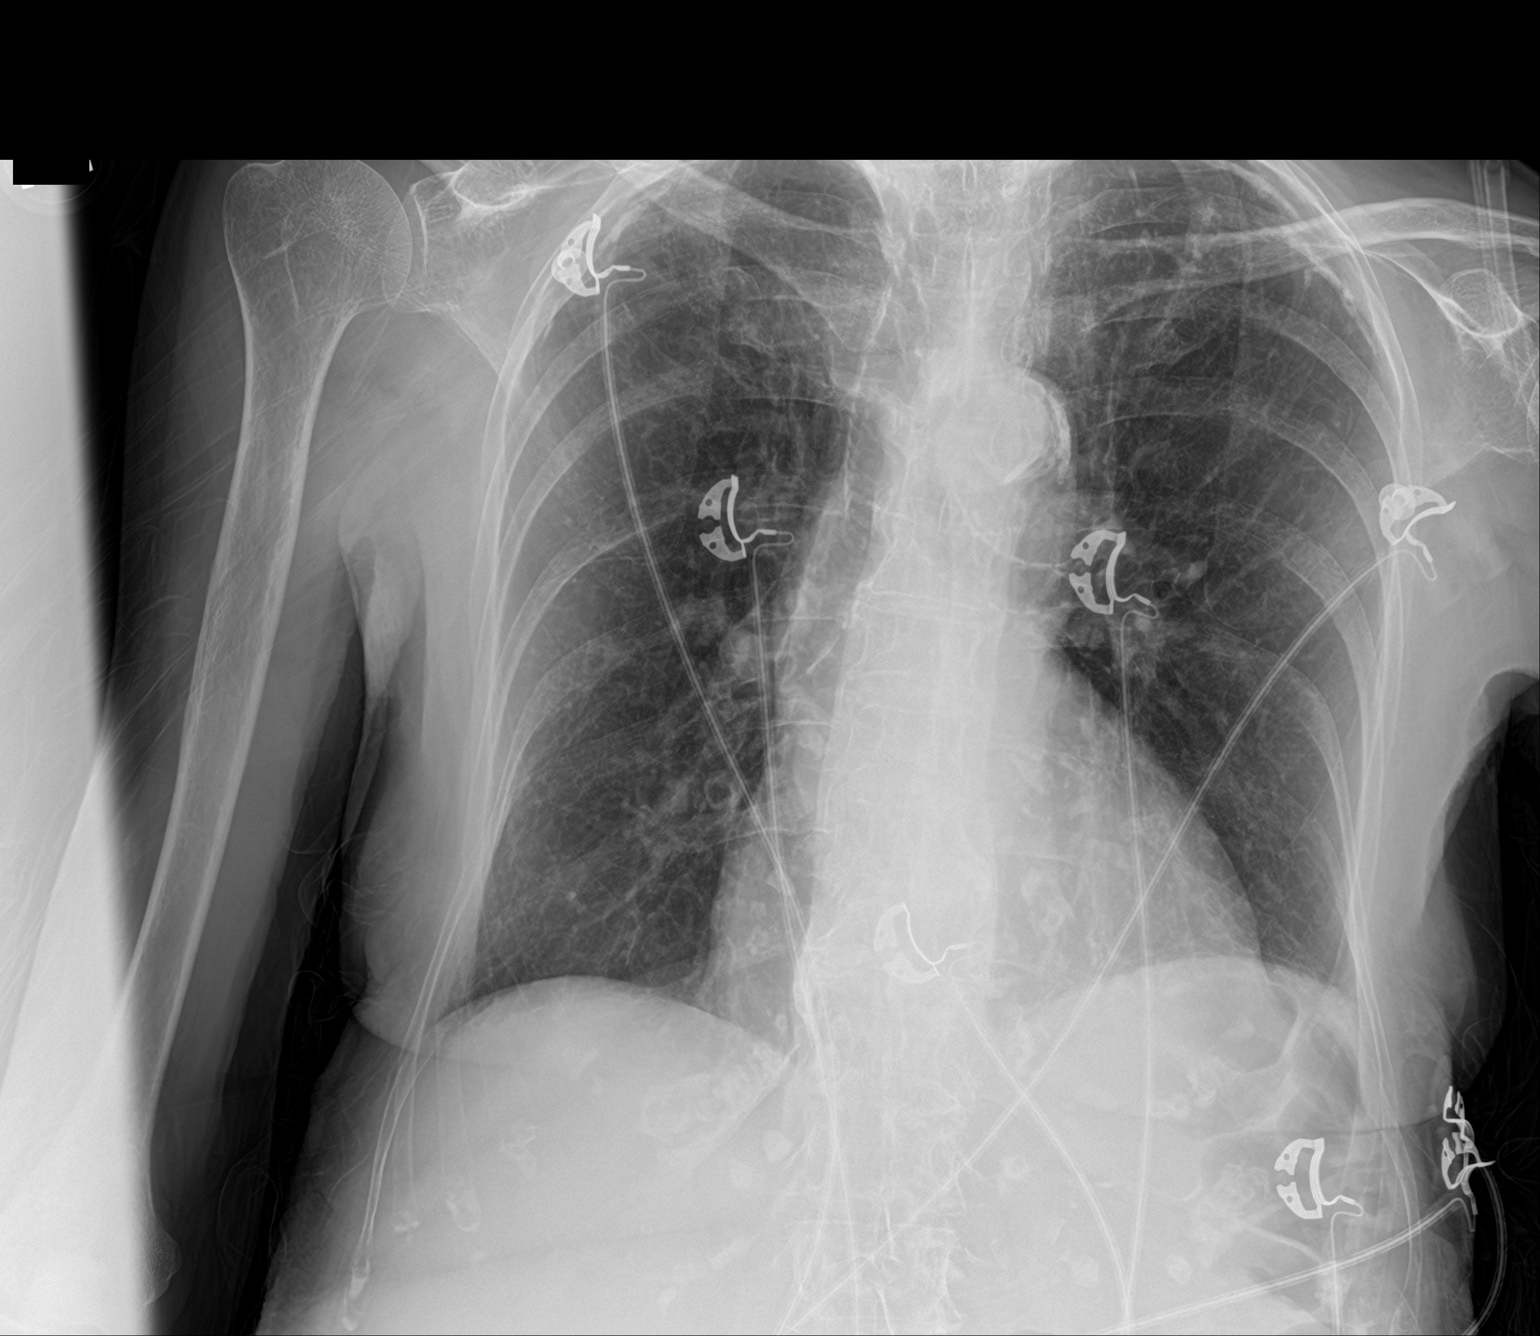

[1 of 1 positions shown; findings below may reference images not displayed]

FINDINGS: The heart size and mediastinal contours are within normal limits.
Both lungs are clear. The visualized skeletal structures are
unremarkable.
IMPRESSION: No active disease.

## 2020-04-21 IMAGING — CT CT HEAD W/O CM
3 series · 15 of 47 positions shown, 18 images · non-contrast
Comparison: [DATE]

CLINICAL DATA: Fall, RIGHT hip deformity with rotation patient on
blood thinners.

EXAM:
CT HEAD WITHOUT CONTRAST
CT CERVICAL SPINE WITHOUT CONTRAST
TECHNIQUE: Multidetector CT imaging of the head and cervical spine was
performed following the standard protocol without intravenous
contrast. Multiplanar CT image reconstructions of the cervical spine
were also generated.

[Series 2: head wo · axial · 0.40mm/px · z∈[+306,+431]mm · 9 of 31 slices shown, 12 images]
[im 3/31  brain]
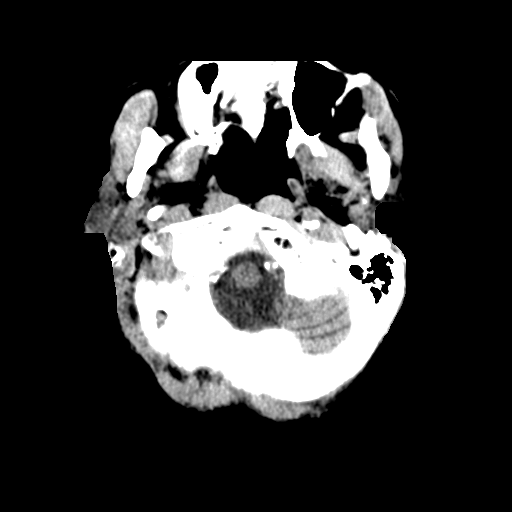
[im 3/31  bone]
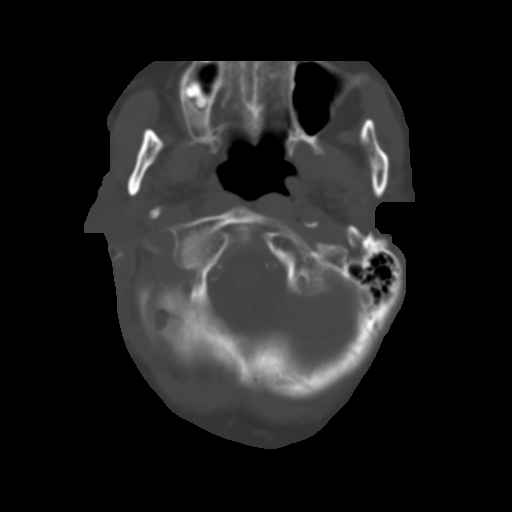
[im 6/31  brain]
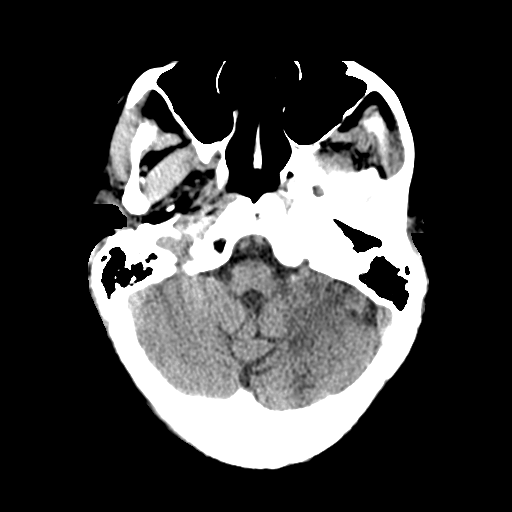
[im 9/31  brain]
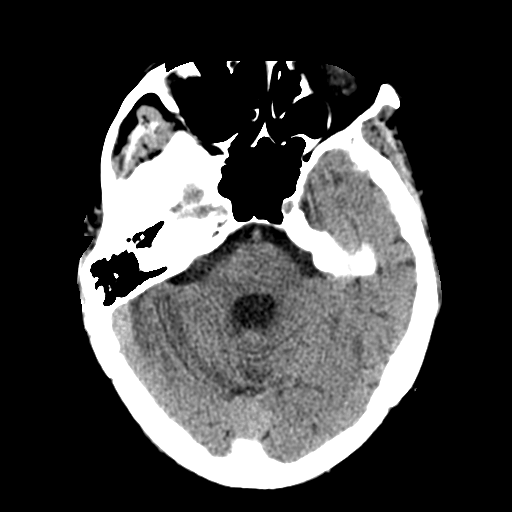
[im 12/31  brain]
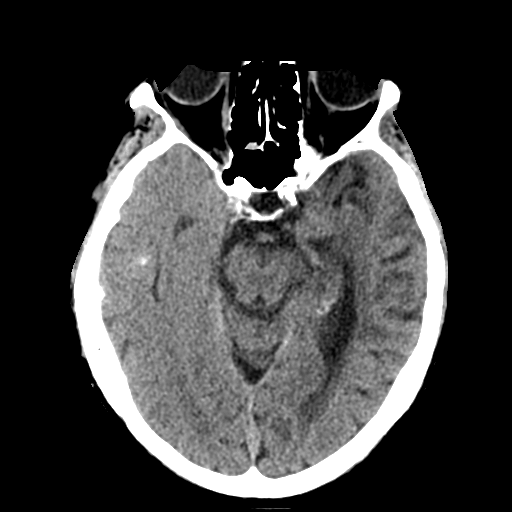
[im 16/31  brain]
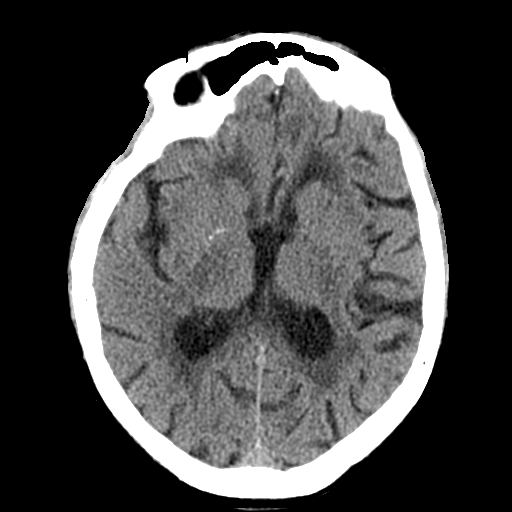
[im 16/31  bone]
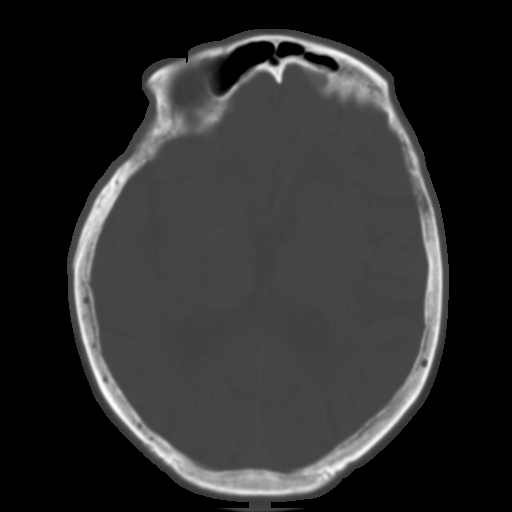
[im 19/31  brain]
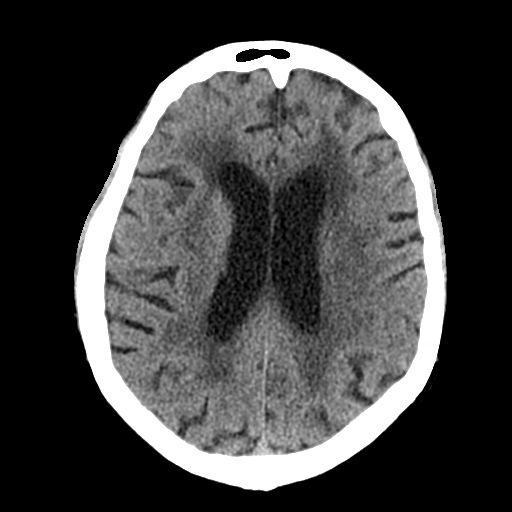
[im 22/31  brain]
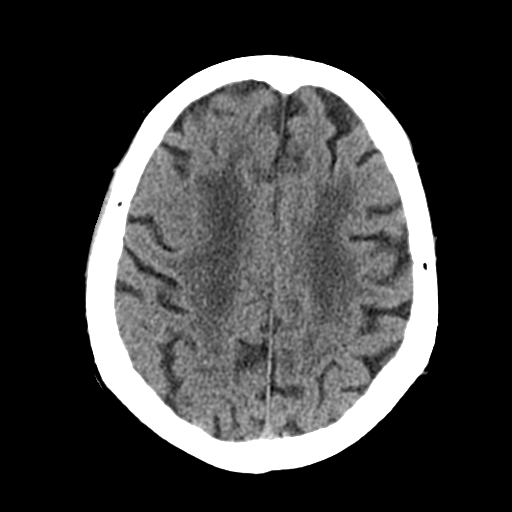
[im 25/31  brain]
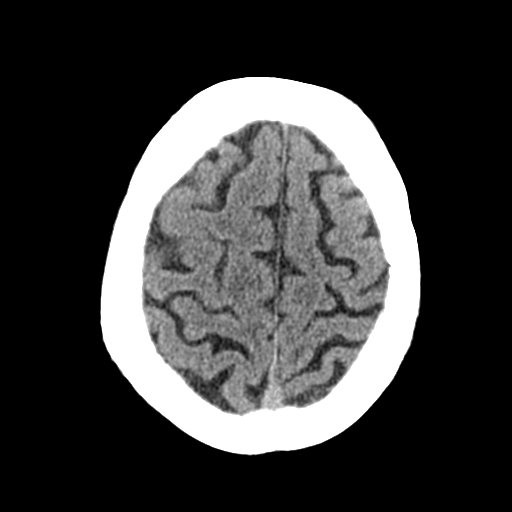
[im 28/31  brain]
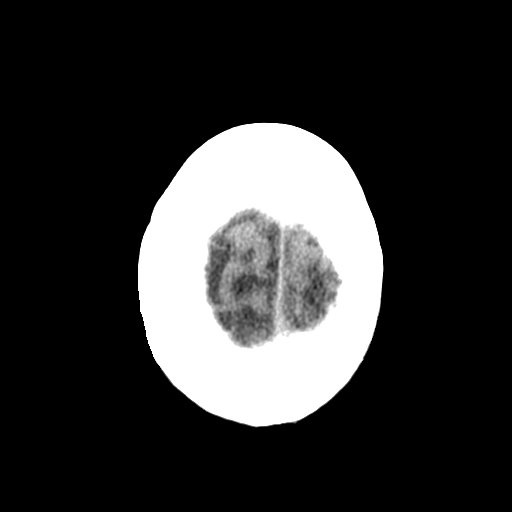
[im 28/31  bone]
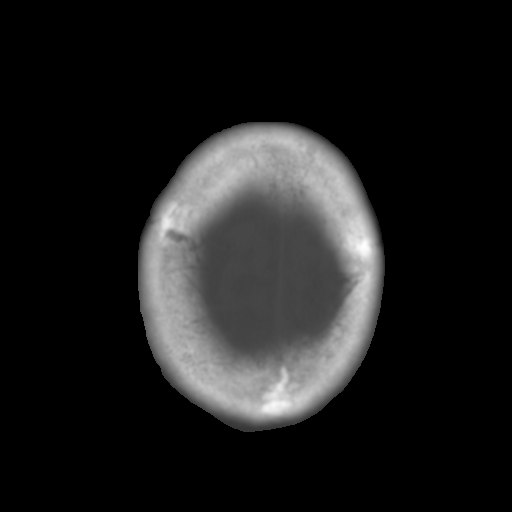

[Series 4: coronal soft tissue · coronal · 0.33mm/px · 3 of 69 slices shown]
[im 25/69  brain]
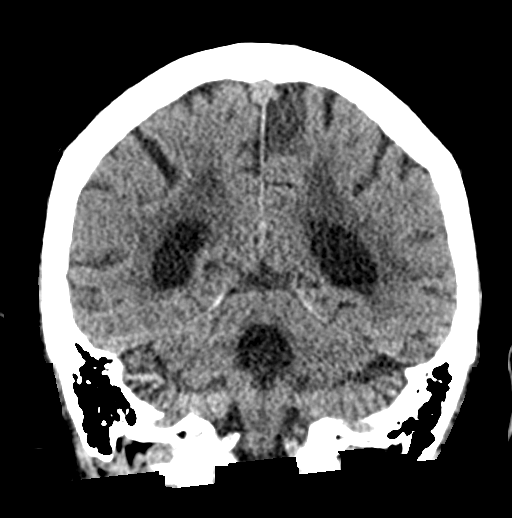
[im 31/69  brain]
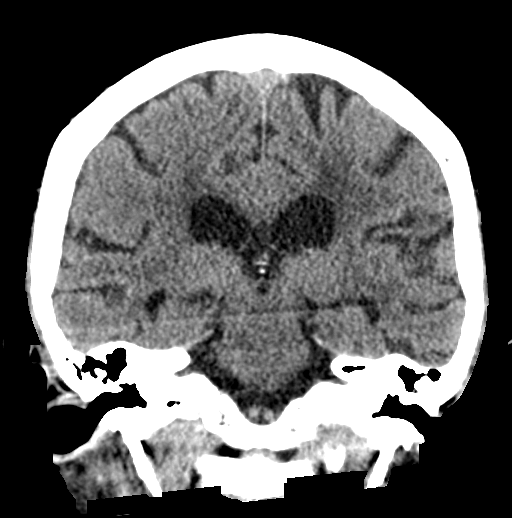
[im 38/69  brain]
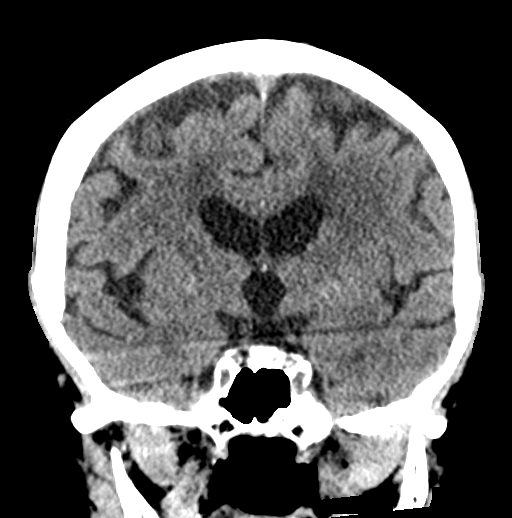

[Series 5: sagittal soft tissue · sagittal · 0.34mm/px · 3 of 57 slices shown]
[im 19/57  brain]
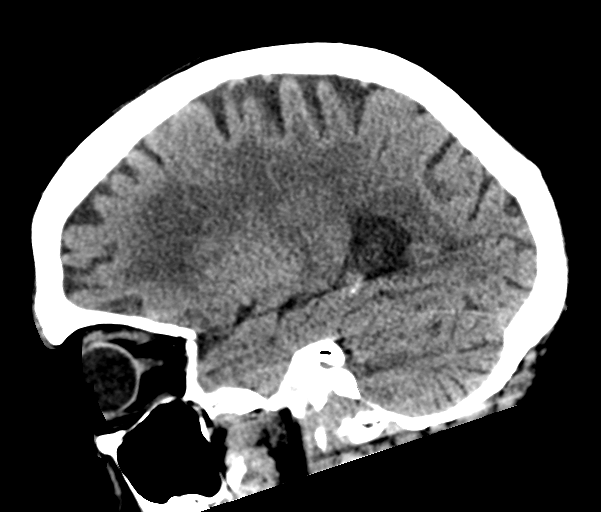
[im 29/57  brain]
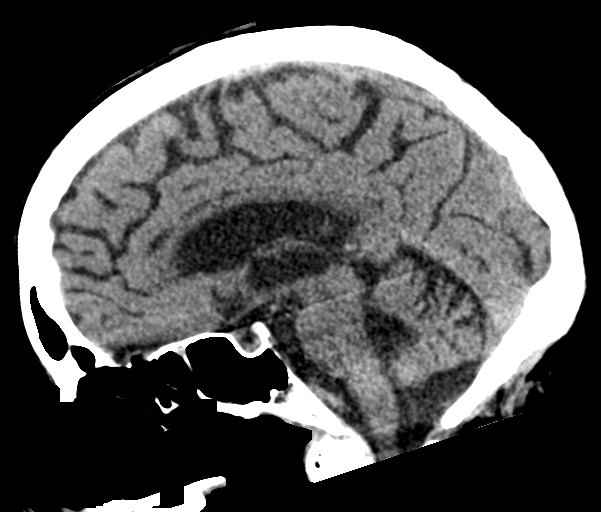
[im 38/57  brain]
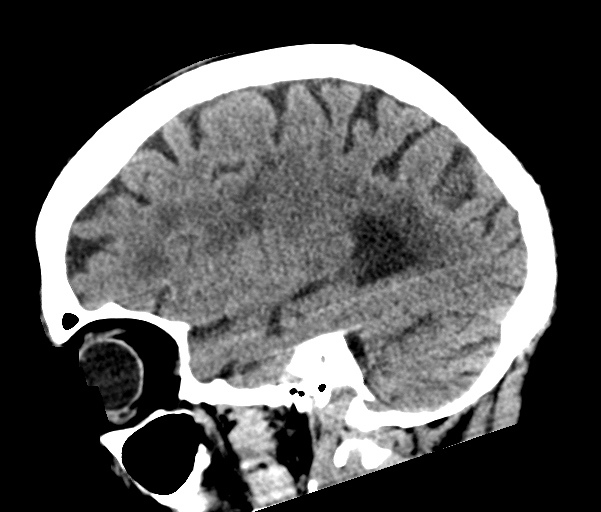

[15 of 47 positions shown; findings below may reference images not displayed]

FINDINGS: CT HEAD FINDINGS

Brain: No evidence of acute infarction, hemorrhage, hydrocephalus,
extra-axial collection or mass lesion/mass effect. Signs of atrophy
and chronic microvascular ischemic change as before.

Vascular: No hyperdense vessel or unexpected calcification.

Skull: Normal. Negative for fracture or focal lesion.

Sinuses/Orbits: No acute finding.

Other: None.

CT CERVICAL SPINE FINDINGS

Alignment: Increased lordosis of the cervical spine is unchanged
compared to prior imaging

Skull base and vertebrae: No acute fracture. No primary bone lesion
or focal pathologic process.

Soft tissues and spinal canal: No prevertebral fluid or swelling. No
visible canal hematoma.

Disc levels: Multilevel degenerative change throughout the cervical
spine greatest at C5-6 and C6-7 with near complete loss of the disc
space at these levels and signs of uncovertebral degenerative
spurring.

Facet arthropathy throughout the cervical spine greatest at C3-4 on
the RIGHT where there is ankylosis of facets with facet hypertrophy
at C4-5 on the LEFT.

Upper chest: Biapical pleural and parenchymal scarring is unchanged.

Other: None
IMPRESSION: 1. No acute intracranial abnormality.
2. Signs of atrophy and chronic microvascular ischemic change as
before.
3. No evidence for acute fracture or subluxation of the cervical
spine.
4. Multilevel degenerative change and facet arthropathy as
described.

## 2020-04-21 IMAGING — CT CT CERVICAL SPINE W/O CM
3 of 4 series · 12 of 35 positions shown, 14 images · non-contrast
Comparison: [DATE]

CLINICAL DATA: Fall, RIGHT hip deformity with rotation patient on
blood thinners.

EXAM:
CT HEAD WITHOUT CONTRAST
CT CERVICAL SPINE WITHOUT CONTRAST
TECHNIQUE: Multidetector CT imaging of the head and cervical spine was
performed following the standard protocol without intravenous
contrast. Multiplanar CT image reconstructions of the cervical spine
were also generated.

[Series 6: sagittal bone · sagittal · 0.21mm/px · 5 of 60 slices shown, 6 images]
[im 20/60  bone]
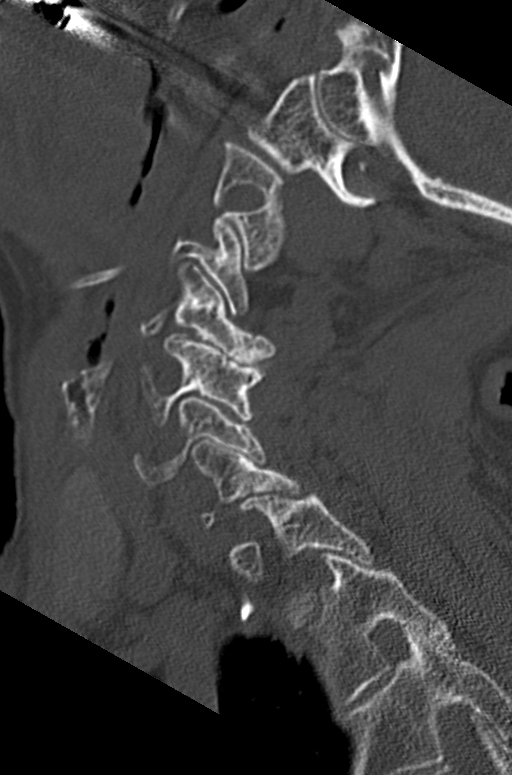
[im 25/60  bone]
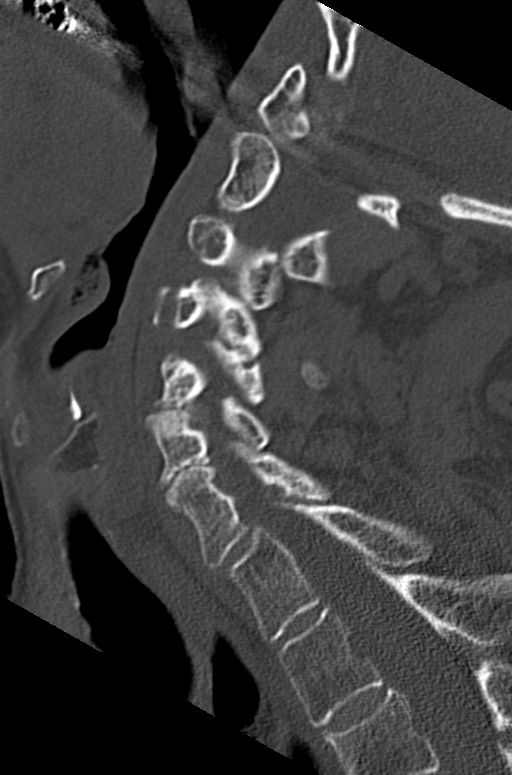
[im 30/60  soft-tissue]
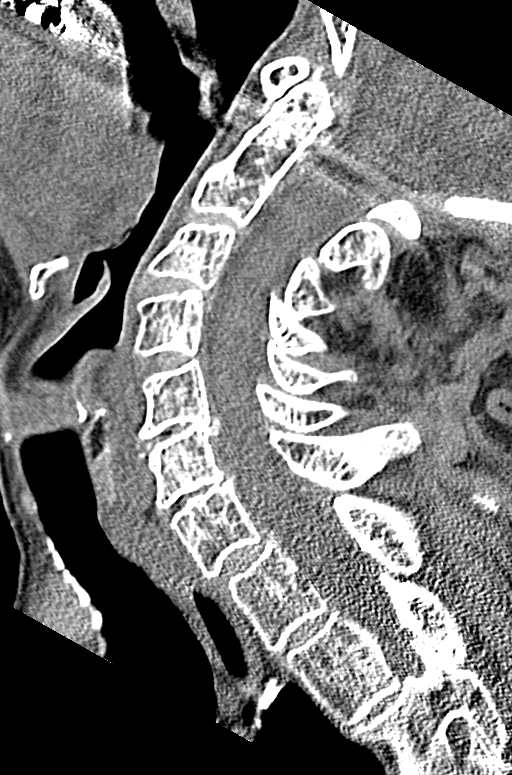
[im 30/60  bone]
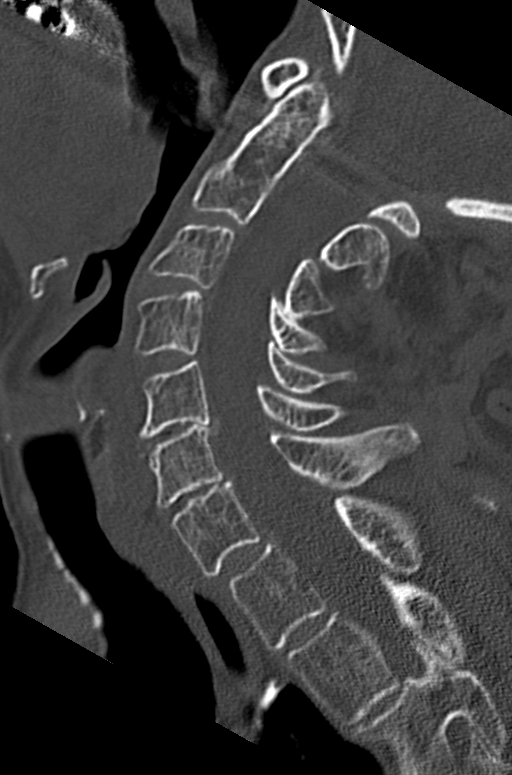
[im 35/60  bone]
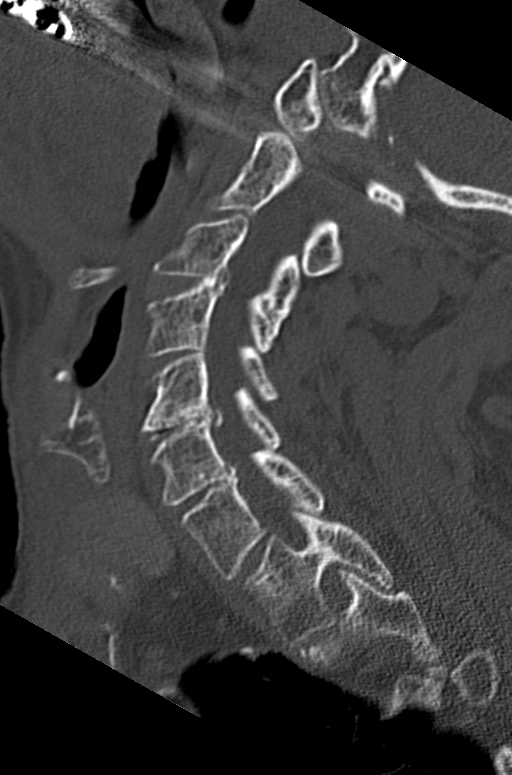
[im 40/60  bone]
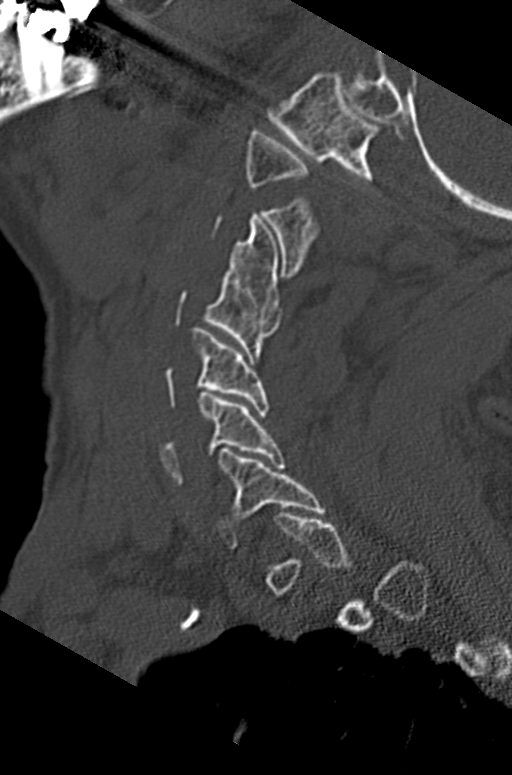

[Series 7: coronal bone · coronal · 0.23mm/px · 3 of 58 slices shown]
[im 14/58  bone]
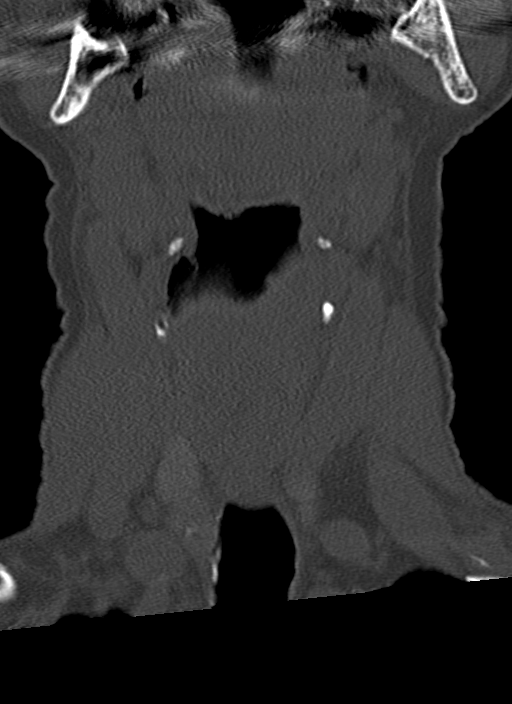
[im 24/58  bone]
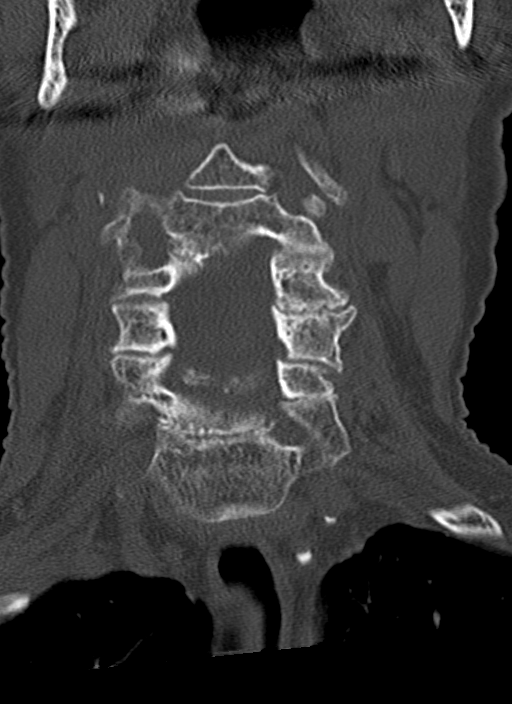
[im 34/58  bone]
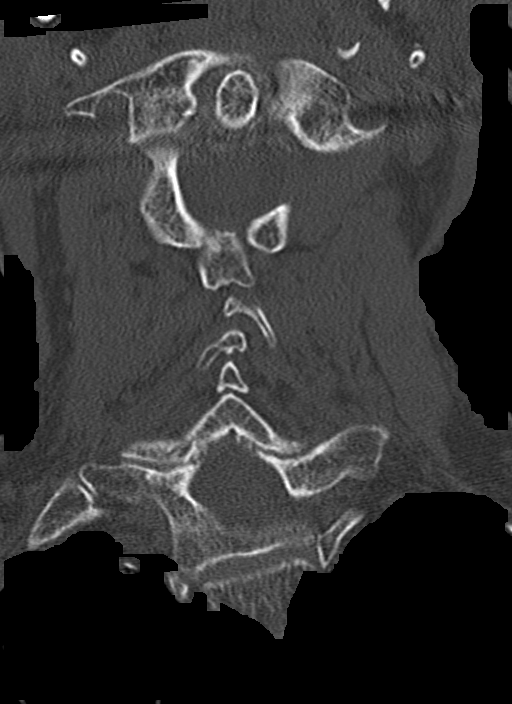

[Series 8: orthogonal bone · axial · 0.21mm/px · z∈[+159,+261]mm · 4 of 83 slices shown, 5 images]
[im 12/83  soft-tissue]
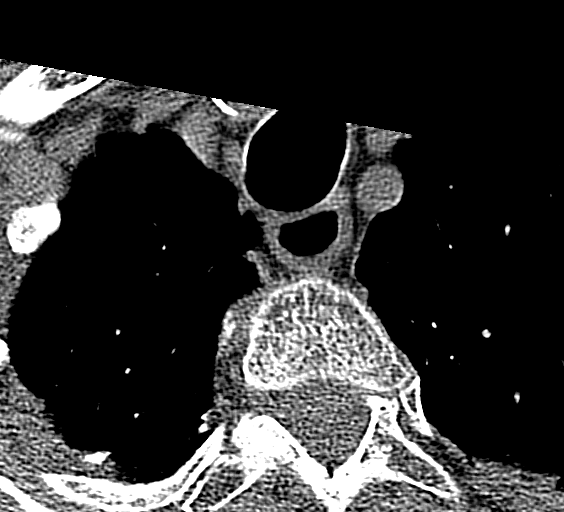
[im 12/83  bone]
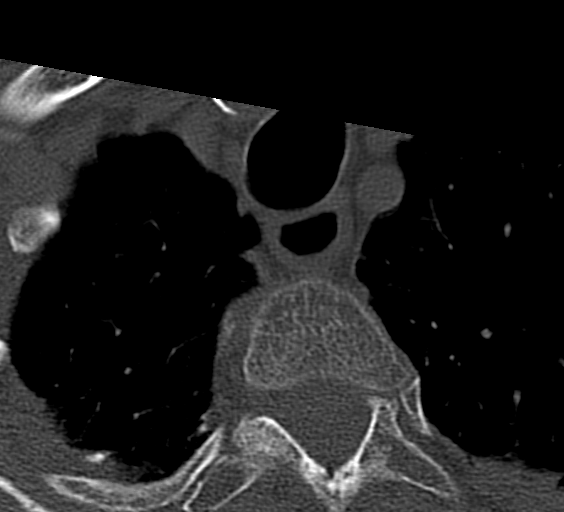
[im 36/83  bone]
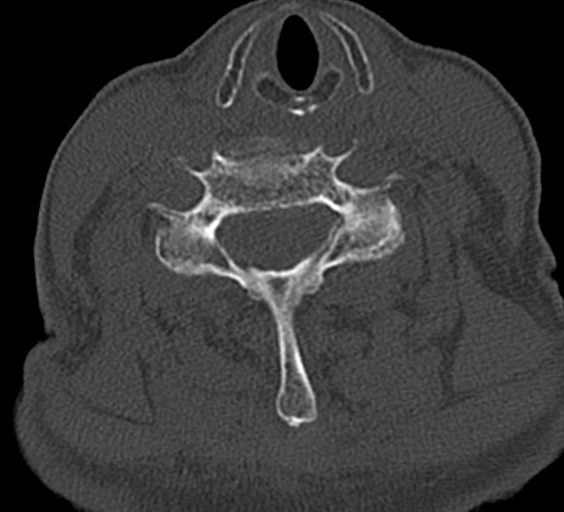
[im 47/83  bone]
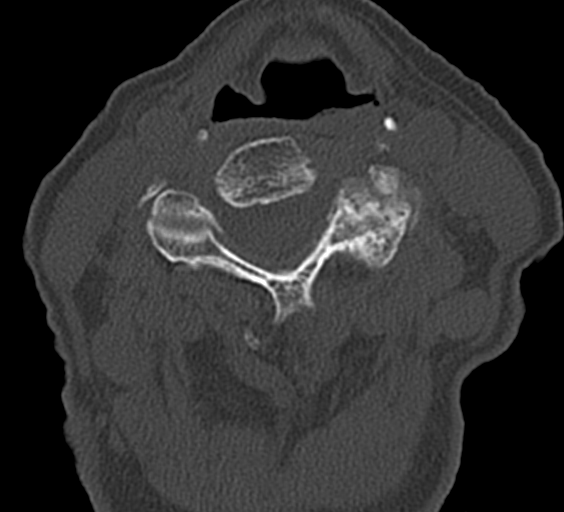
[im 71/83  bone]
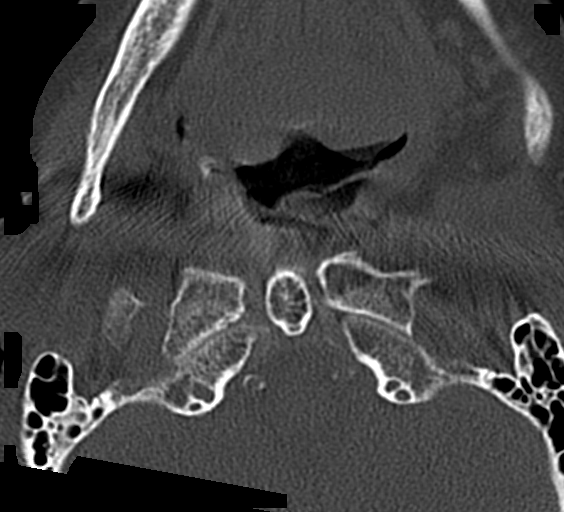

[12 of 35 positions shown; findings below may reference images not displayed]

FINDINGS: CT HEAD FINDINGS

Brain: No evidence of acute infarction, hemorrhage, hydrocephalus,
extra-axial collection or mass lesion/mass effect. Signs of atrophy
and chronic microvascular ischemic change as before.

Vascular: No hyperdense vessel or unexpected calcification.

Skull: Normal. Negative for fracture or focal lesion.

Sinuses/Orbits: No acute finding.

Other: None.

CT CERVICAL SPINE FINDINGS

Alignment: Increased lordosis of the cervical spine is unchanged
compared to prior imaging

Skull base and vertebrae: No acute fracture. No primary bone lesion
or focal pathologic process.

Soft tissues and spinal canal: No prevertebral fluid or swelling. No
visible canal hematoma.

Disc levels: Multilevel degenerative change throughout the cervical
spine greatest at C5-6 and C6-7 with near complete loss of the disc
space at these levels and signs of uncovertebral degenerative
spurring.

Facet arthropathy throughout the cervical spine greatest at C3-4 on
the RIGHT where there is ankylosis of facets with facet hypertrophy
at C4-5 on the LEFT.

Upper chest: Biapical pleural and parenchymal scarring is unchanged.

Other: None
IMPRESSION: 1. No acute intracranial abnormality.
2. Signs of atrophy and chronic microvascular ischemic change as
before.
3. No evidence for acute fracture or subluxation of the cervical
spine.
4. Multilevel degenerative change and facet arthropathy as
described.

## 2020-04-21 IMAGING — CR DG HIP (WITH OR WITHOUT PELVIS) 2-3V*R*
1 series · 3 of 3 positions shown · non-contrast
Comparison: None.

CLINICAL DATA: Right hip pain after fall.

EXAM:
DG HIP (WITH OR WITHOUT PELVIS) 2-3V RIGHT

[Series 1: dg hip unilat w or w/o pelvis 2-3 views  · non-contrast · 0.14mm/px · 3 of 3 slices shown]
[im 1/3]
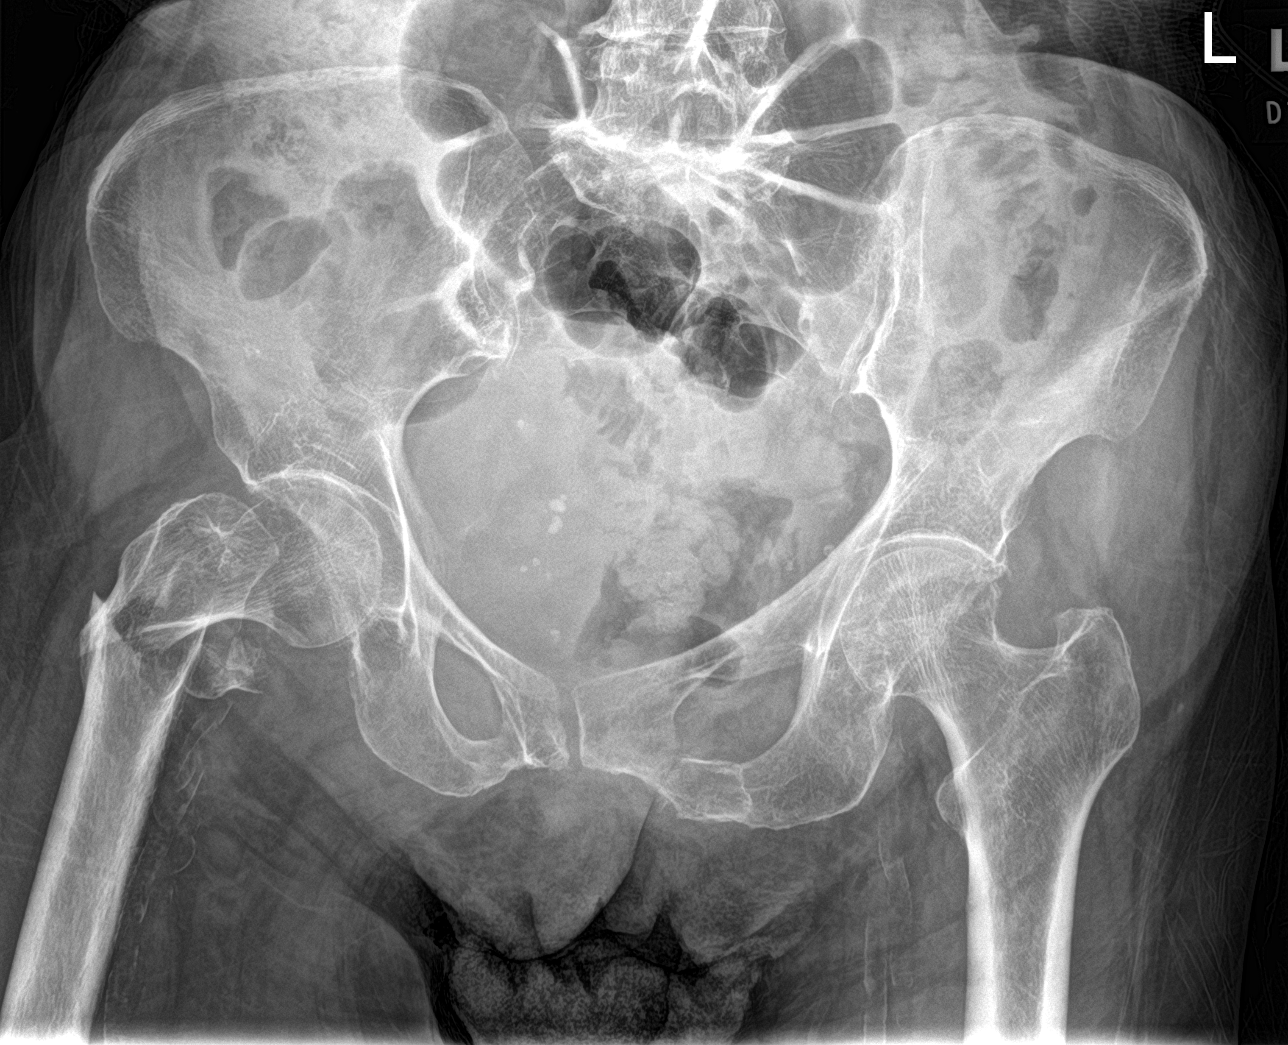
[im 2/3]
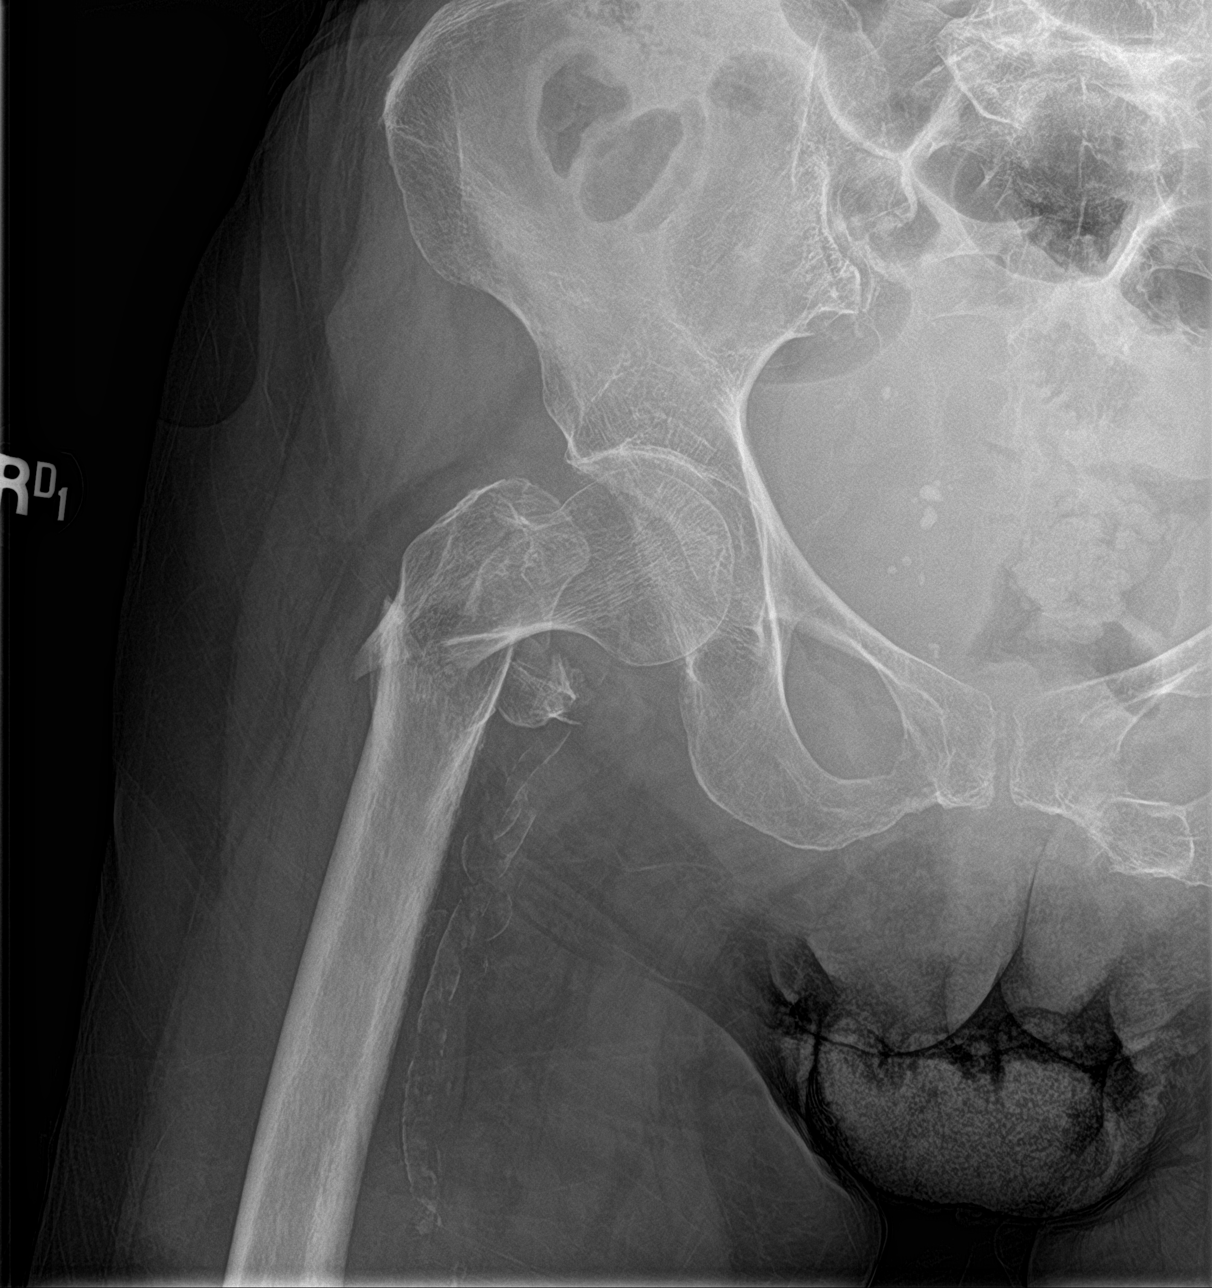
[im 3/3]
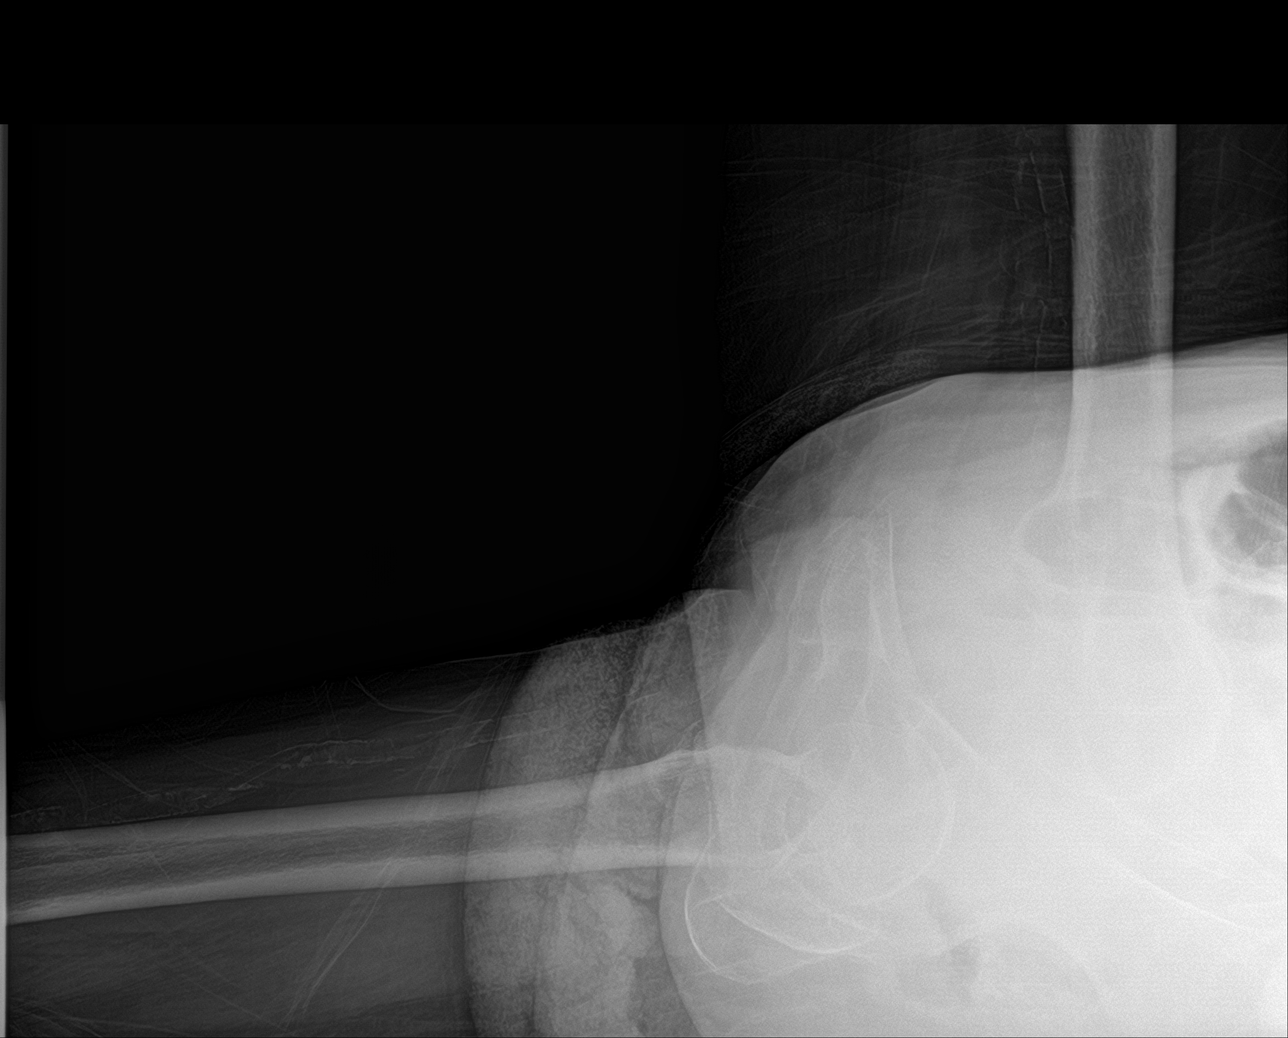

[3 of 3 positions shown; findings below may reference images not displayed]

FINDINGS: Severely comminuted and displaced fracture is seen involving the
intertrochanteric region of the proximal right femur.
IMPRESSION: Severely comminuted and displaced intertrochanteric fracture of
proximal right femur.

## 2020-04-21 MED ORDER — SENNOSIDES-DOCUSATE SODIUM 8.6-50 MG PO TABS
1.0000 | ORAL_TABLET | Freq: Every evening | ORAL | Status: DC | PRN
  Administered 2020-04-23 (×2): 1 via ORAL
  Filled 2020-04-21 (×3): qty 1

## 2020-04-21 MED ORDER — LATANOPROST 0.005 % OP SOLN
1.0000 [drp] | Freq: Every day | OPHTHALMIC | Status: DC
  Administered 2020-04-21 – 2020-04-30 (×10): 1 [drp] via OPHTHALMIC
  Filled 2020-04-21: qty 2.5

## 2020-04-21 MED ORDER — OXYCODONE-ACETAMINOPHEN 5-325 MG PO TABS
1.0000 | ORAL_TABLET | ORAL | Status: DC | PRN
  Administered 2020-04-21 – 2020-04-22 (×3): 1 via ORAL
  Filled 2020-04-21 (×3): qty 1

## 2020-04-21 MED ORDER — VITAMIN B-12 1000 MCG PO TABS
500.0000 ug | ORAL_TABLET | Freq: Every day | ORAL | Status: DC
  Administered 2020-04-21 – 2020-05-01 (×10): 500 ug via ORAL
  Filled 2020-04-21 (×10): qty 1

## 2020-04-21 MED ORDER — HYDRALAZINE HCL 20 MG/ML IJ SOLN: 5.0000 mg | INTRAMUSCULAR | Status: DC | PRN

## 2020-04-21 MED ORDER — IRBESARTAN 150 MG PO TABS
75.0000 mg | ORAL_TABLET | Freq: Every day | ORAL | Status: DC
  Administered 2020-04-21 – 2020-05-01 (×10): 75 mg via ORAL
  Filled 2020-04-21 (×10): qty 1

## 2020-04-21 MED ORDER — INSULIN ASPART 100 UNIT/ML ~~LOC~~ SOLN
0.0000 [IU] | Freq: Every day | SUBCUTANEOUS | Status: DC
  Administered 2020-04-26: 2 [IU] via SUBCUTANEOUS
  Administered 2020-04-27: 3 [IU] via SUBCUTANEOUS
  Administered 2020-04-28: 2 [IU] via SUBCUTANEOUS
  Administered 2020-04-29: 3 [IU] via SUBCUTANEOUS
  Filled 2020-04-21 (×4): qty 1

## 2020-04-21 MED ORDER — INSULIN ASPART 100 UNIT/ML ~~LOC~~ SOLN
0.0000 [IU] | Freq: Three times a day (TID) | SUBCUTANEOUS | Status: DC
  Administered 2020-04-21 – 2020-04-22 (×2): 3 [IU] via SUBCUTANEOUS
  Administered 2020-04-22 – 2020-04-23 (×4): 2 [IU] via SUBCUTANEOUS
  Administered 2020-04-23: 3 [IU] via SUBCUTANEOUS
  Administered 2020-04-24 (×2): 2 [IU] via SUBCUTANEOUS
  Administered 2020-04-25: 5 [IU] via SUBCUTANEOUS
  Administered 2020-04-25 (×2): 3 [IU] via SUBCUTANEOUS
  Administered 2020-04-26: 2 [IU] via SUBCUTANEOUS
  Administered 2020-04-26 – 2020-04-27 (×3): 3 [IU] via SUBCUTANEOUS
  Administered 2020-04-27: 2 [IU] via SUBCUTANEOUS
  Administered 2020-04-27 – 2020-04-28 (×2): 3 [IU] via SUBCUTANEOUS
  Administered 2020-04-28: 2 [IU] via SUBCUTANEOUS
  Administered 2020-04-28: 3 [IU] via SUBCUTANEOUS
  Administered 2020-04-29 (×2): 2 [IU] via SUBCUTANEOUS
  Administered 2020-04-29 – 2020-04-30 (×2): 3 [IU] via SUBCUTANEOUS
  Administered 2020-04-30 – 2020-05-01 (×2): 5 [IU] via SUBCUTANEOUS
  Administered 2020-05-01: 2 [IU] via SUBCUTANEOUS
  Administered 2020-05-01: 5 [IU] via SUBCUTANEOUS
  Filled 2020-04-21 (×29): qty 1

## 2020-04-21 MED ORDER — ACETAMINOPHEN 325 MG PO TABS: 650.0000 mg | ORAL_TABLET | Freq: Four times a day (QID) | ORAL | Status: DC | PRN

## 2020-04-21 MED ORDER — VITAMIN D 25 MCG (1000 UNIT) PO TABS
1000.0000 [IU] | ORAL_TABLET | Freq: Every day | ORAL | Status: DC
  Administered 2020-04-22 – 2020-04-30 (×9): 1000 [IU] via ORAL
  Filled 2020-04-21 (×9): qty 1

## 2020-04-21 MED ORDER — ONDANSETRON HCL 4 MG PO TABS: 4.0000 mg | ORAL_TABLET | Freq: Four times a day (QID) | ORAL | Status: DC | PRN

## 2020-04-21 MED ORDER — METHOCARBAMOL 500 MG PO TABS
500.0000 mg | ORAL_TABLET | Freq: Three times a day (TID) | ORAL | Status: DC | PRN
  Administered 2020-04-21 – 2020-04-22 (×4): 500 mg via ORAL
  Filled 2020-04-21 (×5): qty 1

## 2020-04-21 MED ORDER — OCUVITE-LUTEIN PO CAPS
1.0000 | ORAL_CAPSULE | Freq: Two times a day (BID) | ORAL | Status: DC
  Administered 2020-04-21 – 2020-05-01 (×16): 1 via ORAL
  Filled 2020-04-21 (×22): qty 1

## 2020-04-21 MED ORDER — MORPHINE SULFATE (PF) 2 MG/ML IV SOLN: 0.1000 mg | INTRAVENOUS | Status: DC | PRN

## 2020-04-21 NOTE — ED Triage Notes (Signed)
pt arrives via ems from home. ems reports pt got up to put bathobe on and fell on carpet. rt hip deformity with rotation. pulses wnl ems reprts no head trauma or LOC. pt takes eliquis.  hx afib and tia.   ems admin 50 fentanyl  A&ox4 nad noted at this time.

## 2020-04-21 NOTE — Consult Note (Addendum)
ORTHOPAEDIC CONSULTATION  REQUESTING PHYSICIAN: Lorretta HarpNiu, Xilin, MD  Chief Complaint: Right hip pain that is post fall  HPI: Suzanne Cantu is a 86 y.o. female who complains of right hip pain status post fall at home. Patient explains that she lost her balance when in her bathroom. Patient had pain in the right hip and was unable to stand or ambulate after the fall. Patient denies other injuries. [facility] where x-rays revealed a displaced intertrochanteric hip fracture. Patient has a history of atrial fibrillation is on Eliquis. Her last dose was last night at 9 PM. Patient is being admitted to the hospital service for preoperative clearance. Orthopedics is consulted for management of her right intertrochanteric hip fracture. Patient is seen in her hospital bed this evening with her daughter at the bedside. Patient denies any significant pain at rest currently and denies any numbness or tingling in the right lower extremity.  Past Medical History:  Diagnosis Date  . Diabetes mellitus without complication (HCC)   . Hypertension   . Stroke [facility](HCC)    Past Surgical History:  Procedure Laterality Date  . ABDOMINAL HYSTERECTOMY    . CESAREAN SECTION     4  . CHOLECYSTECTOMY    . KIDNEY STONE SURGERY     Social History   Socioeconomic History  . Marital status: Widowed    Spouse name: Not on file  . Number of children: Not on file  . Years of education: Not on file  . Highest education level: Not on file  Occupational History  . Not on file  Tobacco Use  . Smoking status: Never Smoker  . Smokeless tobacco: Never Used  Substance and Sexual Activity  . Alcohol use: Never  . Drug use: Never  . Sexual activity: Not on file  Other Topics Concern  . Not on file  Social History Narrative  . Not on file   Social Determinants of Health   Financial Resource Strain:   . Difficulty of Paying Living Expenses: Not on file  Food  Insecurity:   . Worried About Programme researcher, broadcasting/film/videounning Out of Food in the Last Year: Not on file  . Ran Out of Food in the Last Year: Not on file  Transportation Needs:   . Lack of Transportation (Medical): Not on file  . Lack of Transportation (Non-Medical): Not on file  Physical Activity:   . Days of Exercise per Week: Not on file  . Minutes of Exercise per Session: Not on file  Stress:   . Feeling of Stress : Not on file  Social Connections:   . Frequency of Communication with Friends and Family: Not on file  . Frequency of Social Gatherings with Friends and Family: Not on file  . Attends Religious Services: Not on file  . Active Member of Clubs or Organizations: Not on file  . Attends BankerClub or Organization Meetings: Not on file  . Marital Status: Not on file   Family History  Problem Relation Age of Onset  . Diabetes Mellitus II Sister    Allergies  Allergen Reactions  . Valproic Acid And Related Other (See Comments)    Low blood pressure, weakness.  Remus Blake. Metaxalone Other (See Comments)    "It made me not feel right"  . Peanut-Containing Drug Products Other (See Comments)    Raises blood sugar   Prior to Admission medications   Medication Sig Start Date End Date Taking? Authorizing Provider  apixaban (ELIQUIS) 2.5 MG TABS tablet  Take 1 tablet (2.5 mg total) by mouth 2 (two) times daily. 12/23/19  Yes Masoud, Renda Rolls, MD  bimatoprost (LUMIGAN) 0.01 % SOLN Place 1 drop into both eyes at bedtime.   Yes [provider]  Cholecalciferol (VITAMIN D-3 PO) Take 1,000 mg by mouth daily after supper.    Yes [provider]  metFORMIN (GLUCOPHAGE) 500 MG tablet Take 1 tablet (500 mg total) by mouth 2 (two) times daily with a meal. 02/19/20  Yes Masoud, Renda Rolls, MD  Multiple Vitamins-Minerals (PRESERVISION AREDS 2) CAPS Take 1 capsule by mouth 2 (two) times daily.   Yes [provider]  valsartan (DIOVAN) 40 MG tablet TAKE 1 TABLET BY MOUTH EVERY DAY 01/14/20  Yes Masoud, Renda Rolls, MD   vitamin B-12 (CYANOCOBALAMIN) 500 MCG tablet Take 1 tablet (500 mcg total) by mouth daily. 08/02/19  Yes Myrtie Neither, MD  aspirin 325 MG EC tablet Take 325 mg by mouth daily. Patient not taking: Reported on 04/21/2020 10/29/19   [provider]  atorvastatin (LIPITOR) 40 MG tablet Take 40 mg by mouth daily. Patient not taking: Reported on 04/21/2020 10/29/19   [provider]  clopidogrel (PLAVIX) 75 MG tablet Take 1 tablet (75 mg total) by mouth daily. Patient not taking: Reported on 04/21/2020 12/19/19   Corky Downs, MD  docusate sodium (COLACE) 250 MG capsule Take 250 mg by mouth daily as needed.     [provider]   DG Chest 1 View  Result Date: 04/21/2020 CLINICAL DATA:  Right hip fracture. EXAM: CHEST  1 VIEW COMPARISON:  None. FINDINGS: The heart size and mediastinal contours are within normal limits. Both lungs are clear. The visualized skeletal structures are unremarkable. IMPRESSION: No active disease. Electronically Signed   By: Lupita Raider M.D.   On: 04/21/2020 10:15   CT Head Wo Contrast  Result Date: 04/21/2020 CLINICAL DATA:  Fall, RIGHT hip deformity with rotation patient on blood thinners. EXAM: CT HEAD WITHOUT CONTRAST CT CERVICAL SPINE WITHOUT CONTRAST TECHNIQUE: Multidetector CT imaging of the head and cervical spine was performed following the standard protocol without intravenous contrast. Multiplanar CT image reconstructions of the cervical spine were also generated. COMPARISON:  Nov 25, 2019 FINDINGS: CT HEAD FINDINGS Brain: No evidence of acute infarction, hemorrhage, hydrocephalus, extra-axial collection or mass lesion/mass effect. Signs of atrophy and chronic microvascular ischemic change as before. Vascular: No hyperdense vessel or unexpected calcification. Skull: Normal. Negative for fracture or focal lesion. Sinuses/Orbits: No acute finding. Other: None. CT CERVICAL SPINE FINDINGS Alignment: Increased lordosis of the cervical spine  is unchanged compared to prior imaging Skull base and vertebrae: No acute fracture. No primary bone lesion or focal pathologic process. Soft tissues and spinal canal: No prevertebral fluid or swelling. No visible canal hematoma. Disc levels: Multilevel degenerative change throughout the cervical spine greatest at C5-6 and C6-7 with near complete loss of the disc space at these levels and signs of uncovertebral degenerative spurring. Facet arthropathy throughout the cervical spine greatest at C3-4 on the RIGHT where there is ankylosis of facets with facet hypertrophy at C4-5 on the LEFT. Upper chest: Biapical pleural and parenchymal scarring is unchanged. Other: None IMPRESSION: 1. No acute intracranial abnormality. 2. Signs of atrophy and chronic microvascular ischemic change as before. 3. No evidence for acute fracture or subluxation of the cervical spine. 4. Multilevel degenerative change and facet arthropathy as described. Electronically Signed   By: Donzetta Kohut M.D.   On: 04/21/2020 11:38   CT Cervical Spine Wo  Contrast  Result Date: 04/21/2020 CLINICAL DATA:  Fall, RIGHT hip deformity with rotation patient on blood thinners. EXAM: CT HEAD WITHOUT CONTRAST CT CERVICAL SPINE WITHOUT CONTRAST TECHNIQUE: Multidetector CT imaging of the head and cervical spine was performed following the standard protocol without intravenous contrast. Multiplanar CT image reconstructions of the cervical spine were also generated. COMPARISON:  Nov 25, 2019 FINDINGS: CT HEAD FINDINGS Brain: No evidence of acute infarction, hemorrhage, hydrocephalus, extra-axial collection or mass lesion/mass effect. Signs of atrophy and chronic microvascular ischemic change as before. Vascular: No hyperdense vessel or unexpected calcification. Skull: Normal. Negative for fracture or focal lesion. Sinuses/Orbits: No acute finding. Other: None. CT CERVICAL SPINE FINDINGS Alignment: Increased lordosis of the cervical spine is unchanged compared  to prior imaging Skull base and vertebrae: No acute fracture. No primary bone lesion or focal pathologic process. Soft tissues and spinal canal: No prevertebral fluid or swelling. No visible canal hematoma. Disc levels: Multilevel degenerative change throughout the cervical spine greatest at C5-6 and C6-7 with near complete loss of the disc space at these levels and signs of uncovertebral degenerative spurring. Facet arthropathy throughout the cervical spine greatest at C3-4 on the RIGHT where there is ankylosis of facets with facet hypertrophy at C4-5 on the LEFT. Upper chest: Biapical pleural and parenchymal scarring is unchanged. Other: None IMPRESSION: 1. No acute intracranial abnormality. 2. Signs of atrophy and chronic microvascular ischemic change as before. 3. No evidence for acute fracture or subluxation of the cervical spine. 4. Multilevel degenerative change and facet arthropathy as described. Electronically Signed   By: Donzetta Kohut M.D.   On: 04/21/2020 11:38   DG Hip Unilat  With Pelvis 2-3 Views Right  Result Date: 04/21/2020 CLINICAL DATA:  Right hip pain after fall. EXAM: DG HIP (WITH OR WITHOUT PELVIS) 2-3V RIGHT COMPARISON:  None. FINDINGS: Severely comminuted and displaced fracture is seen involving the intertrochanteric region of the proximal right femur. IMPRESSION: Severely comminuted and displaced intertrochanteric fracture of proximal right femur. Electronically Signed   By: Lupita Raider M.D.   On: 04/21/2020 10:14    Positive ROS: All other systems have been reviewed and were otherwise negative with the exception of those mentioned in the HPI and as above.  Physical Exam: General: Alert, no acute distress  MUSCULOSKELETAL: Right lower extremity: Patient has intact skin. There is no erythema ecchymosis or significant swelling. Her thigh and leg compartments are soft and compressible. She has palpable pedal pulses and intact sensation to light touch in the right foot.  Patient can gently flex and extend her toes.  Assessment: Displaced right intertrochanteric hip fracture  Plan: I explained to the patient and her daughter the nature of her fracture. I am recommending intramedullary fixation for her right displaced intertrochanteric hip fracture. Patient is on Eliquis and will need to wait 2 to 3 days before surgery can be performed. Explained the details of the operation as well as the postoperative course with the patient and her daughter. I reviewed the patient's labs and radiographic studies in preparation for this case.  I also discussed the risks and benefits of surgery with the patient and her daughter. They understand the risks include but are not limited to infection, bleeding requiring blood transfusion, nerve or blood vessel injury, joint stiffness or loss of motion, persistent pain, weakness or instability, malunion, nonunion and hardware failure and the need for further surgery. Medical risks include but are not limited to DVT and pulmonary embolism, myocardial infarction, stroke, pneumonia, respiratory  failure and death. Patient and her daughter understood these risks and wished to proceed with surgical fixation of her fracture.    Juanell Fairly, MD    04/21/2020 7:51 PM

## 2020-04-21 NOTE — H&P (Signed)
History and Physical    Suzanne Cantu WLN:[id number] DOB: 02-28-1927 DOA: 04/21/2020  Referring MD/NP/PA:   PCP: Corky Downs, MD   Patient coming from:  The patient is coming from home.  At baseline, pt is partially dependent for most of ADL.        Chief Complaint: fall and right hip pain.  HPI: Suzanne Cantu is a 86 y.o. female with medical history significant of hypertension, diabetes mellitus, stroke, seizure, atrial fibrillation on Eliquis, who presents with a fall and right heel pain.  Patient states that she fell while she was putting on her bathrobe and lost her balance. She fell onto her right hip.  She does not think she hit her head. No LOC. She developed pain in the right hip, which is sharp, severe, nonradiating, aggravated by movement.  No unilateral numbness or tingling in extremities, no facial droop or slurred speech.  Patient does not have chest pain, cough, shortness of breath.  No nausea, vomiting, diarrhea or abdominal pain.  No symptoms of UTI.  Her last dose of Eliquis was at 8:45 PM yesterday.  ED Course: pt was found to have WBC 9.6, pending Covid PCR, electrolytes renal function okay, temperature normal, blood pressure 214/75 -->134/79, heart rate 78, RR 14, oxygen saturation 98% on room air.  Chest x-ray negative. X-ray of right hip showed severely comminuted and displaced intertrochanteric fracture of proximal right femur.  CT of head and CT of C-spine negative for acute issues.  Patient is admitted to MedSurg bed as inpatient.  Dr. Martha Clan of Ortho is consulted.   Review of Systems:   General: no fevers, chills, no body weight gain, has fatigue HEENT: no blurry vision, hearing changes or sore throat Respiratory: no dyspnea, coughing, wheezing CV: no chest pain, no palpitations GI: no nausea, vomiting, abdominal pain, diarrhea, constipation GU: no dysuria, burning on urination, increased urinary frequency, hematuria  Ext: no leg edema Neuro: no  unilateral weakness, numbness, or tingling, no vision change or hearing loss. Has fall. Skin: no rash, no skin tear. MSK: has right hip pain Heme: No easy bruising.  Travel history: No recent long distant travel.  Allergy:  Allergies  Allergen Reactions  . Valproic Acid And Related Other (See Comments)    Low blood pressure, weakness.  Remus Blake Other (See Comments)    "It made me not feel right"  . Peanut-Containing Drug Products Other (See Comments)    Raises blood sugar    Past Medical History:  Diagnosis Date  . Diabetes mellitus without complication (HCC)   . Hypertension   . Stroke [facility])     Past Surgical History:  Procedure Laterality Date  . ABDOMINAL HYSTERECTOMY    . CESAREAN SECTION     4  . CHOLECYSTECTOMY    . KIDNEY STONE SURGERY      Social History:  reports that she has never smoked. She has never used smokeless tobacco. She reports that she does not drink alcohol and does not use drugs.  Family History:  Family History  Problem Relation Age of Onset  . Diabetes Mellitus II Sister      Prior to Admission medications   Medication Sig Start Date End Date Taking? Authorizing Provider  apixaban (ELIQUIS) 2.5 MG TABS tablet Take 1 tablet (2.5 mg total) by mouth 2 (two) times daily. 12/23/19   Corky Downs, MD  bimatoprost (LUMIGAN) 0.01 % SOLN Place 1 drop into both eyes at bedtime.    [provider]  Cholecalciferol (VITAMIN D-3 PO) Take 1,000 mg by mouth daily after supper.     [provider]  clopidogrel (PLAVIX) 75 MG tablet Take 1 tablet (75 mg total) by mouth daily. 12/19/19   Corky DownsMasoud, Javed, MD  docusate sodium (COLACE) 250 MG capsule Take 250 mg by mouth daily.    [provider]  metFORMIN (GLUCOPHAGE) 500 MG tablet Take 1 tablet (500 mg total) by mouth 2 (two) times daily with a meal. 02/19/20   Corky DownsMasoud, Javed, MD  Multiple Vitamins-Minerals (PRESERVISION AREDS 2) CAPS Take 1 capsule by mouth 2 (two) times daily.     [provider]  valsartan (DIOVAN) 40 MG tablet TAKE 1 TABLET BY MOUTH EVERY DAY 01/14/20   Corky DownsMasoud, Javed, MD  vitamin B-12 (CYANOCOBALAMIN) 500 MCG tablet Take 1 tablet (500 mcg total) by mouth daily. 08/02/19   Myrtie NeitherUgah, Nwannadiya, MD    Physical Exam: Vitals:   04/21/20 0930 04/21/20 1015 04/21/20 1100 04/21/20 1200  BP: (!) 185/74 (!) 164/70 (!) 150/101 (!) 161/68  Pulse: 76 79 77 75  Resp: 17 14 19 17   Temp:      TempSrc:      SpO2: 97% 96% 97% 96%  Weight:      Height:       General: Not in acute distress HEENT:       Eyes: PERRL, EOMI, no scleral icterus.       ENT: No discharge from the ears and nose, no pharynx injection, no tonsillar enlargement.        Neck: No JVD, no bruit, no mass felt. Heme: No neck lymph node enlargement. Cardiac: S1/S2, RRR, No murmurs, No gallops or rubs. Respiratory: No rales, wheezing, rhonchi or rubs. GI: Soft, nondistended, nontender, no rebound pain, no organomegaly, BS present. GU: No hematuria Ext: No pitting leg edema bilaterally. 1+DP/PT pulse bilaterally. Musculoskeletal: has right hip tenderness.  Right leg is shortened and externally rotated. Skin: No rashes.  Neuro: Alert, oriented X3, cranial nerves II-XII grossly intact, moves all extremities Psych: Patient is not psychotic, no suicidal or hemocidal ideation.  Labs on Admission: I have personally reviewed following labs and imaging studies  CBC: Recent Labs  Lab 04/21/20 1043  WBC 9.6  NEUTROABS 7.9*  HGB 12.5  HCT 37.0  MCV 89.4  PLT 202   Basic Metabolic Panel: Recent Labs  Lab 04/21/20 1043  NA 138  K 3.8  CL 100  CO2 27  GLUCOSE 196*  BUN 7*  CREATININE 0.69  CALCIUM 9.5   GFR: Estimated Creatinine Clearance: 36.2 mL/min (by C-G formula based on SCr of 0.69 mg/dL). Liver Function Tests: No results for input(s): AST, ALT, ALKPHOS, BILITOT, PROT, ALBUMIN in the last 168 hours. No results for input(s): LIPASE, AMYLASE in the last 168 hours. No  results for input(s): AMMONIA in the last 168 hours. Coagulation Profile: No results for input(s): INR, PROTIME in the last 168 hours. Cardiac Enzymes: No results for input(s): CKTOTAL, CKMB, CKMBINDEX, TROPONINI in the last 168 hours. BNP (last 3 results) No results for input(s): PROBNP in the last 8760 hours. HbA1C: No results for input(s): HGBA1C in the last 72 hours. CBG: Recent Labs  Lab 04/21/20 1222  GLUCAP 162*   Lipid Profile: No results for input(s): CHOL, HDL, LDLCALC, TRIG, CHOLHDL, LDLDIRECT in the last 72 hours. Thyroid Function Tests: No results for input(s): TSH, T4TOTAL, FREET4, T3FREE, THYROIDAB in the last 72 hours. Anemia Panel: No results for input(s): VITAMINB12, FOLATE, FERRITIN, TIBC, IRON, RETICCTPCT in  the last 72 hours. Urine analysis:    Component Value Date/Time   COLORURINE YELLOW 10/27/2019 1501   APPEARANCEUR CLOUDY (A) 10/27/2019 1501   LABSPEC 1.008 10/27/2019 1501   PHURINE 8.0 10/27/2019 1501   GLUCOSEU NEGATIVE 10/27/2019 1501   HGBUR NEGATIVE 10/27/2019 1501   BILIRUBINUR NEGATIVE 10/27/2019 1501   KETONESUR 5 (A) 10/27/2019 1501   PROTEINUR NEGATIVE 10/27/2019 1501   NITRITE NEGATIVE 10/27/2019 1501   LEUKOCYTESUR NEGATIVE 10/27/2019 1501   Sepsis Labs: (procalcitonin:4,lacticidven:4) ) Recent Results (from the past 240 hour(s))  Respiratory Panel by RT PCR (Flu A&B, Covid) - Nasopharyngeal Swab     Status: None   Collection Time: 04/21/20 10:43 AM   Specimen: Nasopharyngeal Swab  Result Value Ref Range Status   SARS Coronavirus 2 by RT PCR NEGATIVE NEGATIVE Final    Comment: (NOTE) SARS-CoV-2 target nucleic acids are NOT DETECTED.  The SARS-CoV-2 RNA is generally detectable in upper respiratoy specimens during the acute phase of infection. The lowest concentration of SARS-CoV-2 viral copies this assay can detect is 131 copies/mL. A negative result does not preclude SARS-Cov-2 infection and should not be used as the  sole basis for treatment or other patient management decisions. A negative result may occur with  improper specimen collection/handling, submission of specimen other than nasopharyngeal swab, presence of viral mutation(s) within the areas targeted by this assay, and inadequate number of viral copies (<131 copies/mL). A negative result must be combined with clinical observations, patient history, and epidemiological information. The expected result is Negative.  Fact Sheet for Patients:  https://www.moore.com/  Fact Sheet for Healthcare Providers:  https://www.young.biz/  This test is no t yet approved or cleared by the [place] FDA and  has been authorized for detection and/or diagnosis of SARS-CoV-2 by FDA under an Emergency Use Authorization (EUA). This EUA will remain  in effect (meaning this test can be used) for the duration of the COVID-19 declaration under Section 564(b)(1) of the Act, 21 U.S.C. section 360bbb-3(b)(1), unless the authorization is terminated or revoked sooner.     Influenza A by PCR NEGATIVE NEGATIVE Final   Influenza B by PCR NEGATIVE NEGATIVE Final    Comment: (NOTE) The Xpert Xpress SARS-CoV-2/FLU/RSV assay is intended as an aid in  the diagnosis of influenza from Nasopharyngeal swab specimens and  should not be used as a sole basis for treatment. Nasal washings and  aspirates are unacceptable for Xpert Xpress SARS-CoV-2/FLU/RSV  testing.  Fact Sheet for Patients: https://www.moore.com/  Fact Sheet for Healthcare Providers: https://www.young.biz/  This test is not yet approved or cleared by the [place] FDA and  has been authorized for detection and/or diagnosis of SARS-CoV-2 by  FDA under an Emergency Use Authorization (EUA). This EUA will remain  in effect (meaning this test can be used) for the duration of the  Covid-19 declaration under Section 564(b)(1) of  the Act, 21  U.S.C. section 360bbb-3(b)(1), unless the authorization is  terminated or revoked. Performed at [facility], [address], [place], [place] [address]      Radiological Exams on Admission: DG Chest 1 View  Result Date: 04/21/2020 CLINICAL DATA:  Right hip fracture. EXAM: CHEST  1 VIEW COMPARISON:  None. FINDINGS: The heart size and mediastinal contours are within normal limits. Both lungs are clear. The visualized skeletal structures are unremarkable. IMPRESSION: No active disease. Electronically Signed   By: Lupita Raider M.D.   On: 04/21/2020 10:15   CT Head Wo Contrast  Result Date:  04/21/2020 CLINICAL DATA:  Fall, RIGHT hip deformity with rotation patient on blood thinners. EXAM: CT HEAD WITHOUT CONTRAST CT CERVICAL SPINE WITHOUT CONTRAST TECHNIQUE: Multidetector CT imaging of the head and cervical spine was performed following the standard protocol without intravenous contrast. Multiplanar CT image reconstructions of the cervical spine were also generated. COMPARISON:  Nov 25, 2019 FINDINGS: CT HEAD FINDINGS Brain: No evidence of acute infarction, hemorrhage, hydrocephalus, extra-axial collection or mass lesion/mass effect. Signs of atrophy and chronic microvascular ischemic change as before. Vascular: No hyperdense vessel or unexpected calcification. Skull: Normal. Negative for fracture or focal lesion. Sinuses/Orbits: No acute finding. Other: None. CT CERVICAL SPINE FINDINGS Alignment: Increased lordosis of the cervical spine is unchanged compared to prior imaging Skull base and vertebrae: No acute fracture. No primary bone lesion or focal pathologic process. Soft tissues and spinal canal: No prevertebral fluid or swelling. No visible canal hematoma. Disc levels: Multilevel degenerative change throughout the cervical spine greatest at C5-6 and C6-7 with near complete loss of the disc space at these levels and signs of uncovertebral degenerative spurring. Facet  arthropathy throughout the cervical spine greatest at C3-4 on the RIGHT where there is ankylosis of facets with facet hypertrophy at C4-5 on the LEFT. Upper chest: Biapical pleural and parenchymal scarring is unchanged. Other: None IMPRESSION: 1. No acute intracranial abnormality. 2. Signs of atrophy and chronic microvascular ischemic change as before. 3. No evidence for acute fracture or subluxation of the cervical spine. 4. Multilevel degenerative change and facet arthropathy as described. Electronically Signed   By: Donzetta Kohut M.D.   On: 04/21/2020 11:38   CT Cervical Spine Wo Contrast  Result Date: 04/21/2020 CLINICAL DATA:  Fall, RIGHT hip deformity with rotation patient on blood thinners. EXAM: CT HEAD WITHOUT CONTRAST CT CERVICAL SPINE WITHOUT CONTRAST TECHNIQUE: Multidetector CT imaging of the head and cervical spine was performed following the standard protocol without intravenous contrast. Multiplanar CT image reconstructions of the cervical spine were also generated. COMPARISON:  Nov 25, 2019 FINDINGS: CT HEAD FINDINGS Brain: No evidence of acute infarction, hemorrhage, hydrocephalus, extra-axial collection or mass lesion/mass effect. Signs of atrophy and chronic microvascular ischemic change as before. Vascular: No hyperdense vessel or unexpected calcification. Skull: Normal. Negative for fracture or focal lesion. Sinuses/Orbits: No acute finding. Other: None. CT CERVICAL SPINE FINDINGS Alignment: Increased lordosis of the cervical spine is unchanged compared to prior imaging Skull base and vertebrae: No acute fracture. No primary bone lesion or focal pathologic process. Soft tissues and spinal canal: No prevertebral fluid or swelling. No visible canal hematoma. Disc levels: Multilevel degenerative change throughout the cervical spine greatest at C5-6 and C6-7 with near complete loss of the disc space at these levels and signs of uncovertebral degenerative spurring. Facet arthropathy throughout  the cervical spine greatest at C3-4 on the RIGHT where there is ankylosis of facets with facet hypertrophy at C4-5 on the LEFT. Upper chest: Biapical pleural and parenchymal scarring is unchanged. Other: None IMPRESSION: 1. No acute intracranial abnormality. 2. Signs of atrophy and chronic microvascular ischemic change as before. 3. No evidence for acute fracture or subluxation of the cervical spine. 4. Multilevel degenerative change and facet arthropathy as described. Electronically Signed   By: Donzetta Kohut M.D.   On: 04/21/2020 11:38   DG Hip Unilat  With Pelvis 2-3 Views Right  Result Date: 04/21/2020 CLINICAL DATA:  Right hip pain after fall. EXAM: DG HIP (WITH OR WITHOUT PELVIS) 2-3V RIGHT COMPARISON:  None. FINDINGS: Severely comminuted and displaced  fracture is seen involving the intertrochanteric region of the proximal right femur. IMPRESSION: Severely comminuted and displaced intertrochanteric fracture of proximal right femur. Electronically Signed   By: Lupita Raider M.D.   On: 04/21/2020 10:14     EKG: I have personally reviewed.  Atrial fibrillation, QTc 440, LAD, nonspecific T wave change  Assessment/Plan Principal Problem:   Closed right hip fracture (HCC) Active Problems:   Essential hypertension   Controlled type 2 diabetes mellitus without complication, without long-term current use of insulin (HCC)   History of CVA (cerebrovascular accident)   Fall   Atrial fibrillation, chronic (HCC)   Closed right hip fracture (HCC): As evidenced by x-ray. Patient has severe pain now. No neurovascular compromise. Orthopedic surgeon, Dr. Martha Clan was consulted.   - will admit to Med-surg bed - Pain control: morphine prn and percocet - When necessary Zofran for nausea - Robaxin for muscle spasm - Appreciated Dr. consultation - type and cross - INR/PTT - PT/OT when able to (not ordered now)  Essential hypertension: -IV hydralazine as needed -Irbesartan  Controlled type 2  diabetes mellitus without complication, without long-term current use of insulin (HCC): Recent A1c 6.5, well controlled.  Patient taking Metformin at home -Sliding scale insulin  History of CVA (cerebrovascular accident) -Patient is Eliquis for A. fib which is on hold  Fall:  -PT/OT when able to  Chronic A fib: HR 78. -Hold Eliquis    Perioperative Cardiac Risk:   He has multiple comorbidities, including hypertension, hyperlipidemia, diabetes mellitus, stroke, atrial fibrillation on Eliquis, but no history of CHF or CAD. Currently patient is active and partially independent of his ADLs, IADLs. She uses walker for ambulation.  Patient does not have chest pain, shortness of breath, palpitation, leg edema.  No signs of CHF currently.  EKG has no acute change. At this time point, no further work up is needed. His GUPTA score perioperative myocardial infarction or cardaic arrest is 1.93%. I discussed the risk with patient and her daughter.  They want to proceed for surgery.   DVT ppx: SCD Code Status: Full code Family Communication:   Yes, patient's  Daughter  at bed side Disposition Plan:  Anticipate discharge back to previous environment Consults called:  Dr. Martha Clan Admission status: Med-surg bed as inpt     Status is: Inpatient  Remains inpatient appropriate because:Inpatient level of care appropriate due to severity of illness   Dispo: The patient is from: Home              Anticipated d/c is to: to be determined              Anticipated d/c date is: 2 days              Patient currently is not medically stable to d/c.           Date of Service 04/21/2020    Lorretta Harp [facility]   If 7PM-7AM, please contact night-coverage [email or web address] 04/21/2020, 12:27 PM

## 2020-04-21 NOTE — ED Provider Notes (Signed)
[facility] Emergency Department Provider Note   ____________________________________________   I have reviewed the triage vital signs and the nursing notes.   HISTORY  Chief Complaint Fall and Hip Pain   History limited by: Not Limited   HPI Suzanne Cantu is a 86 y.o. female who presents to the emergency department today because of concern for right hip pain after a fall. The patient says she was working on putting on her bathrobe when she lost her balance. Fell onto her right hip. Does not think she hit her head, certainly did not have any LOC. Has severe pain in the right hip with movement. Denies any radiation of the pain. Denies any other traumatic injury. Denies any recent illness, chest pain or shortness of breath.    Records reviewed. Per medical record review patient has a history of DM, HTN, CVA.  Past Medical History:  Diagnosis Date  . Diabetes mellitus without complication (HCC)   . Hypertension   . Stroke [facility])     Patient Active Problem List   Diagnosis Date Noted  . Fall 04/21/2020  . Closed right hip fracture (HCC) 04/21/2020  . Seizure (HCC) 12/15/2019  . History of CVA (cerebrovascular accident) 11/07/2019  . CVA (cerebral vascular accident) (HCC) 10/27/2019  . Encephalopathy acute 07/31/2019  . Hypertensive urgency 07/31/2019  . Essential hypertension 07/31/2019  . Controlled type 2 diabetes mellitus without complication, without long-term current use of insulin (HCC) 07/31/2019  . Chronic back pain 07/31/2019  . Hypertensive emergency 07/31/2019  . Acute encephalopathy 07/31/2019    Past Surgical History:  Procedure Laterality Date  . ABDOMINAL HYSTERECTOMY    . CESAREAN SECTION     4  . CHOLECYSTECTOMY    . KIDNEY STONE SURGERY      Prior to Admission medications   Medication Sig Start Date End Date Taking? Authorizing Provider  apixaban (ELIQUIS) 2.5 MG TABS tablet Take 1 tablet (2.5 mg total) by mouth 2 (two)  times daily. 12/23/19   Corky Downs, MD  bimatoprost (LUMIGAN) 0.01 % SOLN Place 1 drop into both eyes at bedtime.    [provider]  Cholecalciferol (VITAMIN D-3 PO) Take 1,000 mg by mouth daily after supper.     [provider]  clopidogrel (PLAVIX) 75 MG tablet Take 1 tablet (75 mg total) by mouth daily. 12/19/19   Corky Downs, MD  docusate sodium (COLACE) 250 MG capsule Take 250 mg by mouth daily.    [provider]  metFORMIN (GLUCOPHAGE) 500 MG tablet Take 1 tablet (500 mg total) by mouth 2 (two) times daily with a meal. 02/19/20   Corky Downs, MD  Multiple Vitamins-Minerals (PRESERVISION AREDS 2) CAPS Take 1 capsule by mouth 2 (two) times daily.    [provider]  valsartan (DIOVAN) 40 MG tablet TAKE 1 TABLET BY MOUTH EVERY DAY 01/14/20   Corky Downs, MD  vitamin B-12 (CYANOCOBALAMIN) 500 MCG tablet Take 1 tablet (500 mcg total) by mouth daily. 08/02/19   Myrtie Neither, MD    Allergies Valproic acid and related, Metaxalone, and Peanut-containing drug products  History reviewed. No pertinent family history.  Social History Social History   Tobacco Use  . Smoking status: Never Smoker  . Smokeless tobacco: Never Used  Substance Use Topics  . Alcohol use: Never  . Drug use: Not on file    Review of Systems Constitutional: No fever/chills Eyes: No visual changes. ENT: No sore throat. Cardiovascular: Denies chest pain. Respiratory: Denies shortness of  breath. Gastrointestinal: No abdominal pain.  No nausea, no vomiting.  No diarrhea.   Genitourinary: Negative for dysuria. Musculoskeletal: Positive for right hip pain. Skin: Negative for rash. Neurological: Negative for headaches, focal weakness or numbness.  ____________________________________________   PHYSICAL EXAM:  VITAL SIGNS: ED Triage Vitals  Enc Vitals Group     BP 04/21/20 0919 (!) 214/75     Pulse Rate 04/21/20 0914 78     Resp 04/21/20 0914 14     Temp 04/21/20  0919 98.1 F (36.7 C)     Temp Source 04/21/20 0919 Oral     SpO2 04/21/20 0914 98 %     Weight 04/21/20 0919 112 lb (50.8 kg)     Height 04/21/20 0919 5\' 2"  (1.575 m)     Head Circumference --      Peak Flow --      Pain Score 04/21/20 0917 6   Constitutional: Alert and oriented.  Eyes: Conjunctivae are normal.  ENT      Head: Normocephalic and atraumatic.      Nose: No congestion/rhinnorhea.      Mouth/Throat: Mucous membranes are moist.      Neck: No stridor. Hematological/Lymphatic/Immunilogical: No cervical lymphadenopathy. Cardiovascular: Normal rate, regular rhythm.  No murmurs, rubs, or gallops.  Respiratory: Normal respiratory effort without tachypnea nor retractions. Breath sounds are clear and equal bilaterally. No wheezes/rales/rhonchi. Gastrointestinal: Soft and non tender. No rebound. No guarding.  Genitourinary: Deferred Musculoskeletal: Right leg shortened and externally rotated. DP 2+.  Neurologic:  Normal speech and language. No gross focal neurologic deficits are appreciated.  Skin:  Skin is warm, dry and intact. No rash noted. Psychiatric: Mood and affect are normal. Speech and behavior are normal. Patient exhibits appropriate insight and judgment.  ____________________________________________    LABS (pertinent positives/negatives)  CBC wbc 9.6, hgb 12.5, plt 202 BMP wnl except glu 196, BUN 7 COVID negative ____________________________________________   EKG  I, 04/23/20, attending physician, personally viewed and interpreted this EKG  EKG Time: 1044 Rate: 76 Rhythm: sinus atrial rhythm Axis: left axis deviation Intervals: qtc 440 QRS: LAFB ST changes: no st elevation Impression: abnormal ekg  ____________________________________________    RADIOLOGY  Right hip Intertrochantaric fracture  CXR No active  disease  ____________________________________________   PROCEDURES  Procedures  ____________________________________________   INITIAL IMPRESSION / ASSESSMENT AND PLAN / ED COURSE  Pertinent labs & imaging results that were available during my care of the patient were reviewed by me and considered in my medical decision making (see chart for details).   Patient presented to the emergency department today because of concern for right hip pain after a fall. On exam right lower extremity shortened and externally rotated. NV intact distally. X-ray confirms hip fracture. Dr. Phineas Semen with orthopedic surgery was notified. Discussed findings with patient and family. Will plan on admission to the hospitalist service.   ____________________________________________   FINAL CLINICAL IMPRESSION(S) / ED DIAGNOSES  Final diagnoses:  Right hip pain  Fall, initial encounter  Closed fracture of right hip, initial encounter [facility])     Note: This dictation was prepared with Dragon dictation. Any transcriptional errors that result from this process are unintentional     [facility], MD 04/21/20 1458

## 2020-04-21 NOTE — TOC Initial Note (Signed)
Transition of Care [facility]) - Initial/Assessment Note    Patient Details  Name: Suzanne Cantu MRN: [id number] Date of Birth: [birth year]  Transition of Care [facility]) CM/SW Contact:    Marina Goodell Phone Number: [phone] 04/21/2020, 1:13 PM  Clinical Narrative:                 Patient presents to [facility] ED due to femur fracture.  Patient lives at home with daughter and 24/7 care givers.  Patient is able top perform personal ADLs independently, dressing, bathing, going to the bathroom, eating.  The patient foes not drive and her daughter's Kerrin Champagne [phone] and Eino Farber [phone] assist the patient with getting to doctor's appointments.  CSW explained TOC role in patient care and process for continuation of care/.  CSW gave Ms. Isley the medicare.gov website information and she emailed it to her sister, Ms. Judie Petit.  CSW went over the difference between home health and SNF, and the patient and her daughter Ms. Ernest Mallick both prefer home health, the patient has 24 hour care.  Expected Discharge Plan: Skilled Nursing Facility Barriers to Discharge: Continued Medical Work up   Patient Goals and CMS Choice Patient states their goals for this hospitalization and ongoing recovery are:: To return home after surgery. CMS Medicare.gov Compare Post Acute Care list provided to:: Other (Comment Required) Kerrin Champagne [phone]) Choice offered to / list presented to : Patient, Adult Children  Expected Discharge Plan and Services Expected Discharge Plan: Skilled Nursing Facility In-house Referral: Clinical Social Work   Post Acute Care Choice: Home Health Living arrangements for the past 2 months: Single Family Home                                      Prior Living Arrangements/Services Living arrangements for the past 2 months: Single Family Home Lives with:: Adult Children, Other (Comment) Theatre stage manager) Patient language and need for interpreter reviewed::  Yes Do you feel safe going back to the place where you live?: Yes      Need for Family Participation in Patient Care: Yes (Comment) Care giver support system in place?: Yes (comment)   Criminal Activity/Legal Involvement Pertinent to Current Situation/Hospitalization: No - Comment as needed  Activities of Daily Living      Permission Sought/Granted Permission sought to share information with : Family Supports Eino Farber (Daughter) [phone])    Share Information with NAME: Kerrin Champagne [phone]           Emotional Assessment Appearance:: Appears younger than stated age Attitude/Demeanor/Rapport: Engaged Affect (typically observed): Quiet, Stable Orientation: : Oriented to Self, Oriented to Place, Oriented to  Time, Oriented to Situation Alcohol / Substance Use: Not Applicable Psych Involvement: No (comment)  Admission diagnosis:  Closed right hip fracture (HCC) [S72.001A] Patient Active Problem List   Diagnosis Date Noted  . Fall 04/21/2020  . Closed right hip fracture (HCC) 04/21/2020  . Atrial fibrillation, chronic (HCC) 04/21/2020  . Seizure (HCC) 12/15/2019  . History of CVA (cerebrovascular accident) 11/07/2019  . CVA (cerebral vascular accident) (HCC) 10/27/2019  . Encephalopathy acute 07/31/2019  . Hypertensive urgency 07/31/2019  . Essential hypertension 07/31/2019  . Controlled type 2 diabetes mellitus without complication, without long-term current use of insulin (HCC) 07/31/2019  . Chronic back pain 07/31/2019  . Hypertensive emergency 07/31/2019  . Acute encephalopathy 07/31/2019   PCP:  Corky Downs, MD Pharmacy:   [facility] [place], [place] - [address] [place] [address] Phone: [phone] Fax: [phone]  [facility] - [place], [place] - [address] [place] [address] Phone: [phone] Fax: [phone]     Social Determinants of Health (SDOH)  Interventions    Readmission Risk Interventions No flowsheet data found.

## 2020-04-21 NOTE — NC FL2 (Addendum)
[place] MEDICAID FL2 LEVEL OF CARE SCREENING TOOL     IDENTIFICATION  Patient Name: Suzanne Cantu Birthdate: 09/28/1926 Sex: female Admission Date (Current Location): 04/21/2020  [place] and [place] Number:  Chiropodist and Address:  [facility], [address], [place], [place] [address]      Provider Number: [id number]  Attending Physician Name and Address:  Lorretta Harp, MD  Relative Name and Phone Number:  Eino Farber (Daughter) [phone], Kerrin Champagne daughter, [phone]    Current Level of Care: Hospital Recommended Level of Care: Skilled Nursing Facility Prior Approval Number:    Date Approved/Denied:   PASRR Number: [id number]  Discharge Plan: SNF    Current Diagnoses: Patient Active Problem List   Diagnosis Date Noted  . Fall 04/21/2020  . Closed right hip fracture (HCC) 04/21/2020  . Atrial fibrillation, chronic (HCC) 04/21/2020  . Seizure (HCC) 12/15/2019  . History of CVA (cerebrovascular accident) 11/07/2019  . CVA (cerebral vascular accident) (HCC) 10/27/2019  . Encephalopathy acute 07/31/2019  . Hypertensive urgency 07/31/2019  . Essential hypertension 07/31/2019  . Controlled type 2 diabetes mellitus without complication, without long-term current use of insulin (HCC) 07/31/2019  . Chronic back pain 07/31/2019  . Hypertensive emergency 07/31/2019  . Acute encephalopathy 07/31/2019    Orientation RESPIRATION BLADDER Height & Weight     Self, Time, Situation, Place  Normal Continent Weight: 112 lb (50.8 kg) Height:  5\' 2"  (157.5 cm)  BEHAVIORAL SYMPTOMS/MOOD NEUROLOGICAL BOWEL NUTRITION STATUS      Continent Diet  AMBULATORY STATUS COMMUNICATION OF NEEDS Skin   Supervision Verbally Normal                       Personal Care Assistance Level of Assistance  Bathing, Feeding, Dressing Bathing Assistance: Limited assistance Feeding assistance: Independent Dressing Assistance: Independent      Functional Limitations Info             SPECIAL CARE FACTORS FREQUENCY                       Contractures      Additional Factors Info        Valproic Acid And Related High Other (See Comments) Low blood pressure, weakness.  Metaxalone Not Specified Other (See Comments) "It made me not feel right"  Peanut-containing Drug Products Not Specified Other (See Comments) Raises blood sugar                Current Medications (04/21/2020):  This is the current hospital active medication list Current Facility-Administered Medications  Medication Dose Route Frequency Provider Last Rate Last Admin  . acetaminophen (TYLENOL) tablet 650 mg  650 mg Oral Q6H PRN 04/23/2020, MD      . hydrALAZINE (APRESOLINE) injection 5 mg  5 mg Intravenous Q2H PRN Lorretta Harp, MD      . insulin aspart (novoLOG) injection 0-5 Units  0-5 Units Subcutaneous QHS Lorretta Harp, MD      . insulin aspart (novoLOG) injection 0-9 Units  0-9 Units Subcutaneous TID WC Lorretta Harp, MD      . irbesartan (AVAPRO) tablet 75 mg  75 mg Oral Daily Lorretta Harp, MD      . latanoprost (XALATAN) 0.005 % ophthalmic solution 1 drop  1 drop Both Eyes QHS Lorretta Harp, MD      . methocarbamol (ROBAXIN) tablet 500 mg  500 mg Oral Q8H PRN Lorretta Harp, MD   500  mg at 04/21/20 1219  . morphine 2 MG/ML injection 0.1 mg  0.1 mg Intravenous Q4H PRN Lorretta Harp, MD      . ondansetron (ZOFRAN) tablet 4 mg  4 mg Oral Q6H PRN Lorretta Harp, MD      . oxyCODONE-acetaminophen (PERCOCET/ROXICET) 5-325 MG per tablet 1 tablet  1 tablet Oral Q4H PRN Lorretta Harp, MD      . PreserVision AREDS 2 CAPS 1 capsule  1 capsule Oral BID Lorretta Harp, MD      . senna-docusate (Senokot-S) tablet 1 tablet  1 tablet Oral QHS PRN Lorretta Harp, MD      . vitamin B-12 (CYANOCOBALAMIN) tablet 500 mcg  500 mcg Oral Daily Lorretta Harp, MD      . Vitamin D-3 TABS 1,000 mg  1,000 mg Oral QPC supper Lorretta Harp, MD       Current Outpatient Medications  Medication Sig Dispense  Refill  . apixaban (ELIQUIS) 2.5 MG TABS tablet Take 1 tablet (2.5 mg total) by mouth 2 (two) times daily. 60 tablet 6  . bimatoprost (LUMIGAN) 0.01 % SOLN Place 1 drop into both eyes at bedtime.    . Cholecalciferol (VITAMIN D-3 PO) Take 1,000 mg by mouth daily after supper.     . metFORMIN (GLUCOPHAGE) 500 MG tablet Take 1 tablet (500 mg total) by mouth 2 (two) times daily with a meal. 180 tablet 3  . Multiple Vitamins-Minerals (PRESERVISION AREDS 2) CAPS Take 1 capsule by mouth 2 (two) times daily.    . valsartan (DIOVAN) 40 MG tablet TAKE 1 TABLET BY MOUTH EVERY DAY 90 tablet 1  . vitamin B-12 (CYANOCOBALAMIN) 500 MCG tablet Take 1 tablet (500 mcg total) by mouth daily. 30 tablet 0  . aspirin 325 MG EC tablet Take 325 mg by mouth daily. (Patient not taking: Reported on 04/21/2020)    . atorvastatin (LIPITOR) 40 MG tablet Take 40 mg by mouth daily. (Patient not taking: Reported on 04/21/2020)    . clopidogrel (PLAVIX) 75 MG tablet Take 1 tablet (75 mg total) by mouth daily. (Patient not taking: Reported on 04/21/2020) 90 tablet 3  . docusate sodium (COLACE) 250 MG capsule Take 250 mg by mouth daily as needed.        Discharge Medications: Please see discharge summary for a list of discharge medications.  Relevant Imaging Results:  Relevant Lab Results:   Additional Information SS# [id number]  Joseph Art, LCSWA

## 2020-04-21 NOTE — ED Notes (Signed)
Returned from CT.

## 2020-04-22 DIAGNOSIS — I482 Chronic atrial fibrillation, unspecified: Secondary | ICD-10-CM

## 2020-04-22 DIAGNOSIS — S72001A Fracture of unspecified part of neck of right femur, initial encounter for closed fracture: Secondary | ICD-10-CM | POA: Diagnosis not present

## 2020-04-22 DIAGNOSIS — E119 Type 2 diabetes mellitus without complications: Secondary | ICD-10-CM | POA: Diagnosis not present

## 2020-04-22 LAB — GLUCOSE, CAPILLARY
Glucose-Capillary: 182 mg/dL — ABNORMAL HIGH (ref 70–99)
Glucose-Capillary: 198 mg/dL — ABNORMAL HIGH (ref 70–99)
Glucose-Capillary: 200 mg/dL — ABNORMAL HIGH (ref 70–99)
Glucose-Capillary: 207 mg/dL — ABNORMAL HIGH (ref 70–99)
Glucose-Capillary: 223 mg/dL — ABNORMAL HIGH (ref 70–99)

## 2020-04-22 LAB — URINALYSIS, ROUTINE W REFLEX MICROSCOPIC
Bilirubin Urine: NEGATIVE
Glucose, UA: NEGATIVE mg/dL
Hgb urine dipstick: NEGATIVE
Ketones, ur: NEGATIVE mg/dL
Nitrite: NEGATIVE
Protein, ur: NEGATIVE mg/dL
Specific Gravity, Urine: 1.012 (ref 1.005–1.030)
pH: 6 (ref 5.0–8.0)

## 2020-04-22 LAB — CBC
HCT: 34.8 % — ABNORMAL LOW (ref 36.0–46.0)
Hemoglobin: 12 g/dL (ref 12.0–15.0)
MCH: 30.8 pg (ref 26.0–34.0)
MCHC: 34.5 g/dL (ref 30.0–36.0)
MCV: 89.2 fL (ref 80.0–100.0)
Platelets: 195 10*3/uL (ref 150–400)
RBC: 3.9 MIL/uL (ref 3.87–5.11)
RDW: 12.9 % (ref 11.5–15.5)
WBC: 13.4 10*3/uL — ABNORMAL HIGH (ref 4.0–10.5)
nRBC: 0 % (ref 0.0–0.2)

## 2020-04-22 LAB — BASIC METABOLIC PANEL
Anion gap: 10 (ref 5–15)
BUN: 11 mg/dL (ref 8–23)
CO2: 26 mmol/L (ref 22–32)
Calcium: 9.3 mg/dL (ref 8.9–10.3)
Chloride: 96 mmol/L — ABNORMAL LOW (ref 98–111)
Creatinine, Ser: 0.68 mg/dL (ref 0.44–1.00)
GFR, Estimated: >60 mL/min (ref >60)
Glucose, Bld: 203 mg/dL — ABNORMAL HIGH (ref 70–99)
Potassium: 3.9 mmol/L (ref 3.5–5.1)
Sodium: 132 mmol/L — ABNORMAL LOW (ref 135–145)

## 2020-04-22 LAB — HEMOGLOBIN A1C
Hgb A1c MFr Bld: 7.3 % — ABNORMAL HIGH (ref 4.8–5.6)
Mean Plasma Glucose: 163 mg/dL

## 2020-04-22 MED ORDER — KETOROLAC TROMETHAMINE 15 MG/ML IJ SOLN
15.0000 mg | Freq: Four times a day (QID) | INTRAMUSCULAR | Status: DC
  Administered 2020-04-22 – 2020-04-24 (×7): 15 mg via INTRAVENOUS
  Filled 2020-04-22 (×8): qty 1

## 2020-04-22 MED ORDER — OXYCODONE HCL 5 MG PO TABS: 5.0000 mg | ORAL_TABLET | ORAL | Status: DC | PRN

## 2020-04-22 MED ORDER — GLUCERNA SHAKE PO LIQD
237.0000 mL | Freq: Three times a day (TID) | ORAL | Status: DC
  Administered 2020-04-22 – 2020-04-30 (×21): 237 mL via ORAL

## 2020-04-22 MED ORDER — MORPHINE SULFATE (PF) 2 MG/ML IV SOLN: 2.0000 mg | INTRAVENOUS | Status: DC | PRN

## 2020-04-22 MED ORDER — GABAPENTIN 300 MG PO CAPS
300.0000 mg | ORAL_CAPSULE | Freq: Three times a day (TID) | ORAL | Status: DC
  Administered 2020-04-22 – 2020-04-25 (×8): 300 mg via ORAL
  Filled 2020-04-22 (×9): qty 1

## 2020-04-22 MED ORDER — CHLORHEXIDINE GLUCONATE CLOTH 2 % EX PADS
6.0000 | MEDICATED_PAD | Freq: Every day | CUTANEOUS | Status: DC
  Administered 2020-04-22 – 2020-04-23 (×2): 6 via TOPICAL

## 2020-04-22 MED ORDER — ACETAMINOPHEN 325 MG PO TABS: 650.0000 mg | ORAL_TABLET | Freq: Four times a day (QID) | ORAL | Status: DC | PRN

## 2020-04-22 MED ORDER — SODIUM CHLORIDE 0.9 % IV SOLN: INTRAVENOUS | Status: DC

## 2020-04-22 NOTE — Progress Notes (Signed)
PROGRESS NOTE    Suzanne Cantu  FAO:[id number] DOB: June 09, 1927 DOA: 04/21/2020 PCP: Corky DownsMasoud, Javed, MD   Brief Narrative:  86 year old female history of A. fib on Eliquis, CVA, seizure disorder who presents with a mechanical fall and right hip pain.  No loss of consciousness.  On admission x-ray shows severely comminuted and displaced intertrochanteric fracture right proximal femur.  Orthopedics consulted from admission.  Plan for operative fixation during this admission however patient is on Eliquis and we need to wait 3 days prior to administering spinal anesthesia.  Per orthopedics surgery scheduled for first thing Saturday morning   Assessment & Plan:   Principal Problem:   Closed right hip fracture [facility]HCC) Active Problems:   Essential hypertension   Controlled type 2 diabetes mellitus without complication, without long-term current use of insulin (HCC)   History of CVA (cerebrovascular accident)   Fall   Atrial fibrillation, chronic (HCC)  Closed right hip fracture (HCC) As evidenced by x-ray.  Patient has severe pain now. No neurovascular compromise.  Orthopedic surgeon, Dr. Martha ClanKrasinski was consulted.  Plan for operative fixation Saturday, 04/24/2020 Plan: As needed pain control Intravenous fluids As needed antiemetics Plan for OR 04/24/2020  Essential hypertension: -IV hydralazine as needed -Irbesartan  Controlled type 2 diabetes mellitus without complication, without long-term current use of insulin (HCC)  Recent A1c 6.5, well controlled.   Patient taking Metformin at home -Sliding scale insulin -Hold Metformin  History of CVA (cerebrovascular accident) -Patient is Eliquis for A. fib which is on hold  Fall:  -PT/OT when able to  Chronic A fib: HR 78. -Hold Eliquis -No indication for bridging therapy    Perioperative Cardiac Risk:   He has multiple comorbidities, including hypertension, hyperlipidemia, diabetes mellitus, stroke, atrial  fibrillation on Eliquis, but no history of CHF or CAD. Currently patient is active and partially independent of his ADLs, IADLs. She uses walker for ambulation.  Patient does not have chest pain, shortness of breath, palpitation, leg edema.  No signs of CHF currently.  EKG has no acute change. At this time point, no further work up is needed. His GUPTA score perioperative myocardial infarction or cardaic arrest is 1.93%.   DVT prophylaxis: SCD Code Status: Full Family Communication: Daughter at bedside Disposition Plan: Status is: Inpatient  Remains inpatient appropriate because:Inpatient level of care appropriate due to severity of illness   Dispo: The patient is from: Home              Anticipated d/c is to: SNF              Anticipated d/c date is: > 3 days              Patient currently is not medically stable to d/c.   Hip fracture.  Plan for OR 04/24/2020      Consultants:   Orthopedics  Procedures:   None  Antimicrobials:   None   Subjective: Seen and examined.  In pain from the right hip.  No other complaints  Objective: Vitals:   04/22/20 0339 04/22/20 0342 04/22/20 0731 04/22/20 0807  BP: 139/70 (!) 142/70 (!) 153/74   Pulse: 91 87 89   Resp: 17 17 18    Temp: 98.5 F (36.9 C) 98.6 F (37 C) 98.2 F (36.8 C)   TempSrc: Oral Oral Oral   SpO2: 95% 95% 96% 94%  Weight:      Height:       No intake or output data in the 24 hours  ending 04/22/20 1253 Filed Weights   04/21/20 0919  Weight: 50.8 kg    Examination:  General exam: Mild distress due to pain Respiratory system: Normal work of breathing.  Clear to auscultation.  Room air Cardiovascular system: Regular rate, irregular rhythm, no murmurs Gastrointestinal system: Scaphoid, nontender, nondistended, normal bowel sounds  Central nervous system: Alert, oriented to person and place.  No focal deficits  extremities: Right lower extremity shortened and externally rotated. Skin: No rashes,  lesions or ulcers Psychiatry: Judgement and insight appear normal. Mood & affect appropriate.     Data Reviewed: I have personally reviewed following labs and imaging studies  CBC: Recent Labs  Lab 04/21/20 1043 04/22/20 0627  WBC 9.6 13.4*  NEUTROABS 7.9*  --   HGB 12.5 12.0  HCT 37.0 34.8*  MCV 89.4 89.2  PLT 202 195   Basic Metabolic Panel: Recent Labs  Lab 04/21/20 1043 04/22/20 0627  NA 138 132*  K 3.8 3.9  CL 100 96*  CO2 27 26  GLUCOSE 196* 203*  BUN 7* 11  CREATININE 0.69 0.68  CALCIUM 9.5 9.3   GFR: Estimated Creatinine Clearance: 36.2 mL/min (by C-G formula based on SCr of 0.68 mg/dL). Liver Function Tests: No results for input(s): AST, ALT, ALKPHOS, BILITOT, PROT, ALBUMIN in the last 168 hours. No results for input(s): LIPASE, AMYLASE in the last 168 hours. No results for input(s): AMMONIA in the last 168 hours. Coagulation Profile: Recent Labs  Lab 04/21/20 1521  INR 1.1   Cardiac Enzymes: No results for input(s): CKTOTAL, CKMB, CKMBINDEX, TROPONINI in the last 168 hours. BNP (last 3 results) No results for input(s): PROBNP in the last 8760 hours. HbA1C: Recent Labs    04/21/20 1521  HGBA1C 7.3*   CBG: Recent Labs  Lab 04/21/20 1222 04/21/20 1647 04/21/20 2132 04/22/20 0732 04/22/20 1200  GLUCAP 162* 215* 189* 200* 223*   Lipid Profile: No results for input(s): CHOL, HDL, LDLCALC, TRIG, CHOLHDL, LDLDIRECT in the last 72 hours. Thyroid Function Tests: No results for input(s): TSH, T4TOTAL, FREET4, T3FREE, THYROIDAB in the last 72 hours. Anemia Panel: No results for input(s): VITAMINB12, FOLATE, FERRITIN, TIBC, IRON, RETICCTPCT in the last 72 hours. Sepsis Labs: No results for input(s): PROCALCITON, LATICACIDVEN in the last 168 hours.  Recent Results (from the past 240 hour(s))  Respiratory Panel by RT PCR (Flu A&B, Covid) - Nasopharyngeal Swab     Status: None   Collection Time: 04/21/20 10:43 AM   Specimen: Nasopharyngeal Swab    Result Value Ref Range Status   SARS Coronavirus 2 by RT PCR NEGATIVE NEGATIVE Final    Comment: (NOTE) SARS-CoV-2 target nucleic acids are NOT DETECTED.  The SARS-CoV-2 RNA is generally detectable in upper respiratoy specimens during the acute phase of infection. The lowest concentration of SARS-CoV-2 viral copies this assay can detect is 131 copies/mL. A negative result does not preclude SARS-Cov-2 infection and should not be used as the sole basis for treatment or other patient management decisions. A negative result may occur with  improper specimen collection/handling, submission of specimen other than nasopharyngeal swab, presence of viral mutation(s) within the areas targeted by this assay, and inadequate number of viral copies (<131 copies/mL). A negative result must be combined with clinical observations, patient history, and epidemiological information. The expected result is Negative.  Fact Sheet for Patients:  https://www.moore.com/  Fact Sheet for Healthcare Providers:  https://www.young.biz/  This test is no t yet approved or cleared by the [place] and  has been authorized for detection and/or diagnosis of SARS-CoV-2 by FDA under an Emergency Use Authorization (EUA). This EUA will remain  in effect (meaning this test can be used) for the duration of the COVID-19 declaration under Section 564(b)(1) of the Act, 21 U.S.C. section 360bbb-3(b)(1), unless the authorization is terminated or revoked sooner.     Influenza A by PCR NEGATIVE NEGATIVE Final   Influenza B by PCR NEGATIVE NEGATIVE Final    Comment: (NOTE) The Xpert Xpress SARS-CoV-2/FLU/RSV assay is intended as an aid in  the diagnosis of influenza from Nasopharyngeal swab specimens and  should not be used as a sole basis for treatment. Nasal washings and  aspirates are unacceptable for Xpert Xpress SARS-CoV-2/FLU/RSV  testing.  Fact Sheet for  Patients: https://www.moore.com/  Fact Sheet for Healthcare Providers: https://www.young.biz/  This test is not yet approved or cleared by the [place] FDA and  has been authorized for detection and/or diagnosis of SARS-CoV-2 by  FDA under an Emergency Use Authorization (EUA). This EUA will remain  in effect (meaning this test can be used) for the duration of the  Covid-19 declaration under Section 564(b)(1) of the Act, 21  U.S.C. section 360bbb-3(b)(1), unless the authorization is  terminated or revoked. Performed at [facility], [address], [place], [place] [address]          Radiology Studies: DG Chest 1 View  Result Date: 04/21/2020 CLINICAL DATA:  Right hip fracture. EXAM: CHEST  1 VIEW COMPARISON:  None. FINDINGS: The heart size and mediastinal contours are within normal limits. Both lungs are clear. The visualized skeletal structures are unremarkable. IMPRESSION: No active disease. Electronically Signed   By: Lupita Raider M.D.   On: 04/21/2020 10:15   CT Head Wo Contrast  Result Date: 04/21/2020 CLINICAL DATA:  Fall, RIGHT hip deformity with rotation patient on blood thinners. EXAM: CT HEAD WITHOUT CONTRAST CT CERVICAL SPINE WITHOUT CONTRAST TECHNIQUE: Multidetector CT imaging of the head and cervical spine was performed following the standard protocol without intravenous contrast. Multiplanar CT image reconstructions of the cervical spine were also generated. COMPARISON:  Nov 25, 2019 FINDINGS: CT HEAD FINDINGS Brain: No evidence of acute infarction, hemorrhage, hydrocephalus, extra-axial collection or mass lesion/mass effect. Signs of atrophy and chronic microvascular ischemic change as before. Vascular: No hyperdense vessel or unexpected calcification. Skull: Normal. Negative for fracture or focal lesion. Sinuses/Orbits: No acute finding. Other: None. CT CERVICAL SPINE FINDINGS Alignment: Increased lordosis of the  cervical spine is unchanged compared to prior imaging Skull base and vertebrae: No acute fracture. No primary bone lesion or focal pathologic process. Soft tissues and spinal canal: No prevertebral fluid or swelling. No visible canal hematoma. Disc levels: Multilevel degenerative change throughout the cervical spine greatest at C5-6 and C6-7 with near complete loss of the disc space at these levels and signs of uncovertebral degenerative spurring. Facet arthropathy throughout the cervical spine greatest at C3-4 on the RIGHT where there is ankylosis of facets with facet hypertrophy at C4-5 on the LEFT. Upper chest: Biapical pleural and parenchymal scarring is unchanged. Other: None IMPRESSION: 1. No acute intracranial abnormality. 2. Signs of atrophy and chronic microvascular ischemic change as before. 3. No evidence for acute fracture or subluxation of the cervical spine. 4. Multilevel degenerative change and facet arthropathy as described. Electronically Signed   By: Donzetta Kohut M.D.   On: 04/21/2020 11:38   CT Cervical Spine Wo Contrast  Result Date: 04/21/2020 CLINICAL DATA:  Fall, RIGHT hip deformity  with rotation patient on blood thinners. EXAM: CT HEAD WITHOUT CONTRAST CT CERVICAL SPINE WITHOUT CONTRAST TECHNIQUE: Multidetector CT imaging of the head and cervical spine was performed following the standard protocol without intravenous contrast. Multiplanar CT image reconstructions of the cervical spine were also generated. COMPARISON:  Nov 25, 2019 FINDINGS: CT HEAD FINDINGS Brain: No evidence of acute infarction, hemorrhage, hydrocephalus, extra-axial collection or mass lesion/mass effect. Signs of atrophy and chronic microvascular ischemic change as before. Vascular: No hyperdense vessel or unexpected calcification. Skull: Normal. Negative for fracture or focal lesion. Sinuses/Orbits: No acute finding. Other: None. CT CERVICAL SPINE FINDINGS Alignment: Increased lordosis of the cervical spine is  unchanged compared to prior imaging Skull base and vertebrae: No acute fracture. No primary bone lesion or focal pathologic process. Soft tissues and spinal canal: No prevertebral fluid or swelling. No visible canal hematoma. Disc levels: Multilevel degenerative change throughout the cervical spine greatest at C5-6 and C6-7 with near complete loss of the disc space at these levels and signs of uncovertebral degenerative spurring. Facet arthropathy throughout the cervical spine greatest at C3-4 on the RIGHT where there is ankylosis of facets with facet hypertrophy at C4-5 on the LEFT. Upper chest: Biapical pleural and parenchymal scarring is unchanged. Other: None IMPRESSION: 1. No acute intracranial abnormality. 2. Signs of atrophy and chronic microvascular ischemic change as before. 3. No evidence for acute fracture or subluxation of the cervical spine. 4. Multilevel degenerative change and facet arthropathy as described. Electronically Signed   By: Donzetta Kohut M.D.   On: 04/21/2020 11:38   DG Hip Unilat  With Pelvis 2-3 Views Right  Result Date: 04/21/2020 CLINICAL DATA:  Right hip pain after fall. EXAM: DG HIP (WITH OR WITHOUT PELVIS) 2-3V RIGHT COMPARISON:  None. FINDINGS: Severely comminuted and displaced fracture is seen involving the intertrochanteric region of the proximal right femur. IMPRESSION: Severely comminuted and displaced intertrochanteric fracture of proximal right femur. Electronically Signed   By: Lupita Raider M.D.   On: 04/21/2020 10:14        Scheduled Meds: . Chlorhexidine Gluconate Cloth  6 each Topical Daily  . cholecalciferol  1,000 Units Oral QPC supper  . gabapentin  300 mg Oral TID  . insulin aspart  0-5 Units Subcutaneous QHS  . insulin aspart  0-9 Units Subcutaneous TID WC  . irbesartan  75 mg Oral Daily  . ketorolac  15 mg Intravenous Q6H  . latanoprost  1 drop Both Eyes QHS  . multivitamin-lutein  1 capsule Oral BID  . vitamin B-12  500 mcg Oral Daily    Continuous Infusions:   LOS: 1 day    Time spent: 25 minutes    Tresa Moore, MD [facility] Pager [phone]  If 7PM-7AM, please contact night-coverage 04/22/2020, 12:53 PM

## 2020-04-22 NOTE — Progress Notes (Signed)
Subjective:  Hospital day #2.  The patient is currently asleep.  Her daughter is at the bedside.  The daughter explains the patient had a rough night with pain and did not get much sleep.  The patient is now on Neurontin and is resting comfortably.  Objective:   VITALS:   Vitals:   04/22/20 0339 04/22/20 0342 04/22/20 0731 04/22/20 0807  BP: 139/70 (!) 142/70 (!) 153/74   Pulse: 91 87 89   Resp: 17 17 18    Temp: 98.5 F (36.9 C) 98.6 F (37 C) 98.2 F (36.8 C)   TempSrc: Oral Oral Oral   SpO2: 95% 95% 96% 94%  Weight:      Height:        PHYSICAL EXAM: Right lower extremity: Patient not awoken for exam Shortening and external rotation right lower extremity Intact pulses distally No cellulitis present Compartment soft  LABS  Results for orders placed or performed during the hospital encounter of 04/21/20 (from the past 24 hour(s))  Hemoglobin A1c     Status: Abnormal   Collection Time: 04/21/20  3:21 PM  Result Value Ref Range   Hgb A1c MFr Bld 7.3 (H) 4.8 - 5.6 %   Mean Plasma Glucose 163 mg/dL  Protime-INR     Status: None   Collection Time: 04/21/20  3:21 PM  Result Value Ref Range   Prothrombin Time 14.0 11.4 - 15.2 seconds   INR 1.1 0.8 - 1.2  APTT     Status: None   Collection Time: 04/21/20  3:21 PM  Result Value Ref Range   aPTT 34 24 - 36 seconds  Glucose, capillary     Status: Abnormal   Collection Time: 04/21/20  4:47 PM  Result Value Ref Range   Glucose-Capillary 215 (H) 70 - 99 mg/dL  Glucose, capillary     Status: Abnormal   Collection Time: 04/21/20  9:32 PM  Result Value Ref Range   Glucose-Capillary 189 (H) 70 - 99 mg/dL  CBC     Status: Abnormal   Collection Time: 04/22/20  6:27 AM  Result Value Ref Range   WBC 13.4 (H) 4.0 - 10.5 K/uL   RBC 3.90 3.87 - 5.11 MIL/uL   Hemoglobin 12.0 12.0 - 15.0 g/dL   HCT 04/24/20 (L) 36 - 46 %   MCV 89.2 80.0 - 100.0 fL   MCH 30.8 26.0 - 34.0 pg   MCHC 34.5 30.0 - 36.0 g/dL   RDW 95.1 88.4 - 16.6 %    Platelets 195 150 - 400 K/uL   nRBC 0.0 0.0 - 0.2 %  Basic metabolic panel     Status: Abnormal   Collection Time: 04/22/20  6:27 AM  Result Value Ref Range   Sodium 132 (L) 135 - 145 mmol/L   Potassium 3.9 3.5 - 5.1 mmol/L   Chloride 96 (L) 98 - 111 mmol/L   CO2 26 22 - 32 mmol/L   Glucose, Bld 203 (H) 70 - 99 mg/dL   BUN 11 8 - 23 mg/dL   Creatinine, Ser 04/24/20 0.44 - 1.00 mg/dL   Calcium 9.3 8.9 - 0.16 mg/dL   GFR, Estimated 01.0 >93 mL/min   Anion gap 10 5 - 15  Glucose, capillary     Status: Abnormal   Collection Time: 04/22/20  7:32 AM  Result Value Ref Range   Glucose-Capillary 200 (H) 70 - 99 mg/dL  Glucose, capillary     Status: Abnormal   Collection Time: 04/22/20 12:00 PM  Result Value Ref Range   Glucose-Capillary 223 (H) 70 - 99 mg/dL    DG Chest 1 View  Result Date: 04/21/2020 CLINICAL DATA:  Right hip fracture. EXAM: CHEST  1 VIEW COMPARISON:  None. FINDINGS: The heart size and mediastinal contours are within normal limits. Both lungs are clear. The visualized skeletal structures are unremarkable. IMPRESSION: No active disease. Electronically Signed   By: Lupita Raider M.D.   On: 04/21/2020 10:15   CT Head Wo Contrast  Result Date: 04/21/2020 CLINICAL DATA:  Fall, RIGHT hip deformity with rotation patient on blood thinners. EXAM: CT HEAD WITHOUT CONTRAST CT CERVICAL SPINE WITHOUT CONTRAST TECHNIQUE: Multidetector CT imaging of the head and cervical spine was performed following the standard protocol without intravenous contrast. Multiplanar CT image reconstructions of the cervical spine were also generated. COMPARISON:  Nov 25, 2019 FINDINGS: CT HEAD FINDINGS Brain: No evidence of acute infarction, hemorrhage, hydrocephalus, extra-axial collection or mass lesion/mass effect. Signs of atrophy and chronic microvascular ischemic change as before. Vascular: No hyperdense vessel or unexpected calcification. Skull: Normal. Negative for fracture or focal lesion.  Sinuses/Orbits: No acute finding. Other: None. CT CERVICAL SPINE FINDINGS Alignment: Increased lordosis of the cervical spine is unchanged compared to prior imaging Skull base and vertebrae: No acute fracture. No primary bone lesion or focal pathologic process. Soft tissues and spinal canal: No prevertebral fluid or swelling. No visible canal hematoma. Disc levels: Multilevel degenerative change throughout the cervical spine greatest at C5-6 and C6-7 with near complete loss of the disc space at these levels and signs of uncovertebral degenerative spurring. Facet arthropathy throughout the cervical spine greatest at C3-4 on the RIGHT where there is ankylosis of facets with facet hypertrophy at C4-5 on the LEFT. Upper chest: Biapical pleural and parenchymal scarring is unchanged. Other: None IMPRESSION: 1. No acute intracranial abnormality. 2. Signs of atrophy and chronic microvascular ischemic change as before. 3. No evidence for acute fracture or subluxation of the cervical spine. 4. Multilevel degenerative change and facet arthropathy as described. Electronically Signed   By: Donzetta Kohut M.D.   On: 04/21/2020 11:38   CT Cervical Spine Wo Contrast  Result Date: 04/21/2020 CLINICAL DATA:  Fall, RIGHT hip deformity with rotation patient on blood thinners. EXAM: CT HEAD WITHOUT CONTRAST CT CERVICAL SPINE WITHOUT CONTRAST TECHNIQUE: Multidetector CT imaging of the head and cervical spine was performed following the standard protocol without intravenous contrast. Multiplanar CT image reconstructions of the cervical spine were also generated. COMPARISON:  Nov 25, 2019 FINDINGS: CT HEAD FINDINGS Brain: No evidence of acute infarction, hemorrhage, hydrocephalus, extra-axial collection or mass lesion/mass effect. Signs of atrophy and chronic microvascular ischemic change as before. Vascular: No hyperdense vessel or unexpected calcification. Skull: Normal. Negative for fracture or focal lesion. Sinuses/Orbits: No  acute finding. Other: None. CT CERVICAL SPINE FINDINGS Alignment: Increased lordosis of the cervical spine is unchanged compared to prior imaging Skull base and vertebrae: No acute fracture. No primary bone lesion or focal pathologic process. Soft tissues and spinal canal: No prevertebral fluid or swelling. No visible canal hematoma. Disc levels: Multilevel degenerative change throughout the cervical spine greatest at C5-6 and C6-7 with near complete loss of the disc space at these levels and signs of uncovertebral degenerative spurring. Facet arthropathy throughout the cervical spine greatest at C3-4 on the RIGHT where there is ankylosis of facets with facet hypertrophy at C4-5 on the LEFT. Upper chest: Biapical pleural and parenchymal scarring is unchanged. Other: None IMPRESSION: 1. No acute intracranial  abnormality. 2. Signs of atrophy and chronic microvascular ischemic change as before. 3. No evidence for acute fracture or subluxation of the cervical spine. 4. Multilevel degenerative change and facet arthropathy as described. Electronically Signed   By: Donzetta Kohut M.D.   On: 04/21/2020 11:38   DG Hip Unilat  With Pelvis 2-3 Views Right  Result Date: 04/21/2020 CLINICAL DATA:  Right hip pain after fall. EXAM: DG HIP (WITH OR WITHOUT PELVIS) 2-3V RIGHT COMPARISON:  None. FINDINGS: Severely comminuted and displaced fracture is seen involving the intertrochanteric region of the proximal right femur. IMPRESSION: Severely comminuted and displaced intertrochanteric fracture of proximal right femur. Electronically Signed   By: Lupita Raider M.D.   On: 04/21/2020 10:14    Assessment/Plan:     Principal Problem:   Closed right hip fracture (HCC) Active Problems:   Essential hypertension   Controlled type 2 diabetes mellitus without complication, without long-term current use of insulin (HCC)   History of CVA (cerebrovascular accident)   Fall   Atrial fibrillation, chronic (HCC)  I have discussed  this case with anesthesia.  Patient is not a candidate for spinal anesthetic until 3 days following her last dose of Eliquis.  Therefore the case has been scheduled for Saturday morning.  I informed the patient's daughter of this plan.  Patient's urine appears dark.  I have ordered a urinalysis.  I discussed the case with Dr. Georgeann Oppenheim, the hospitalist, who will order the patient IV fluids.   Patient is on bedrest until surgery.   She may eat until Friday night.    Juanell Fairly , MD 04/22/2020, 12:55 PM

## 2020-04-22 NOTE — Progress Notes (Signed)
Initial Nutrition Assessment  DOCUMENTATION CODES:   Severe malnutrition in context of social or environmental circumstances  INTERVENTION:  Provide Glucerna Shake po TID, each supplement provides 220 kcal and 10 grams of protein.  NUTRITION DIAGNOSIS:   Severe Malnutrition related to social / environmental circumstances (hx of weight loss when caring for husband, advanced age) as evidenced by severe fat depletion, moderate muscle depletion, severe muscle depletion.  GOAL:   Patient will meet greater than or equal to 90% of their needs  MONITOR:   PO intake, Supplement acceptance, Labs, Weight trends, I & O's  REASON FOR ASSESSMENT:   Consult Assessment of nutrition requirement/status  ASSESSMENT:   86 year old female with PMHx of HTN, DM, hx CVA, A-fib admitted after mechanical fall with closed right hip fracture.   Plan is for surgical repair on 10/23.  Met with patient at bedside this AM. She was confused and unable to provide any history at that time. She did report to RD that her appetite was decreased and she was requesting milk. At that time patient was NPO but diet has now been advanced to carbohydrate modified as surgery will not be until 10/23.  Spoke with patient's daughter [name] over the phone. She reports patient receives 24/7 care at home. She eats 3 meals per day and eats well at meals. For breakfast she typically has 2 scrambled eggs, toast, bacon or sausage, 1/2 cup apple juice, 1/2 cup milk, 2 cups of coffee. Lunch is either tomato or banana sandwich. Dinner is usually a bowl of green beans, potatoes, with either meatloaf, hamburger patty, or boiled chicken. This is an estimated daily intake of approximately 1232 kcal and 66 grams of protein. Patient also occasionally has homemade smoothies made with protein powder. Discussed increased nutrient needs for post-operative healing. Daughter reports patient would likely drink oral nutrition supplements if she  enjoyed the flavor.  Daughter reports she is unsure if patient has lost any weight. Recently patient's UBW has been between 112-116 lbs. Patient is currently documented to be 50.8 kg (112 lbs). Noted per Palliative note from 11/07/2019 that patient did have weight loss when caring for her husband prior to his passing in 2019 but prior weight was not listed. Could not find weights prior to 2020 in the chart.  Medications reviewed and include: vitamin D3 1000 units daily, Novolog 0-9 units TID, Novolog 0-5 units QHS, Ocuvite BID, vitamin B12 500 micrograms, NS at 100 mL/hr.  Labs reviewed: CBG 182-223, Sodium 132, Chloride 96.  NUTRITION - FOCUSED PHYSICAL EXAM:    Most Recent Value  Orbital Region Severe depletion  Upper Arm Region Severe depletion  Thoracic and Lumbar Region Severe depletion  Buccal Region Moderate depletion  Temple Region Severe depletion  Clavicle Bone Region Severe depletion  Clavicle and Acromion Bone Region Severe depletion  Scapular Bone Region Moderate depletion  Dorsal Hand Severe depletion  Patellar Region Moderate depletion  Anterior Thigh Region Moderate depletion  Posterior Calf Region Severe depletion  Edema (RD Assessment) None  Hair Reviewed  Eyes Reviewed  Mouth Reviewed  Skin Reviewed  Nails Reviewed     Diet Order:   Diet Order            Diet Carb Modified Fluid consistency: Thin; Room service appropriate? Yes  Diet effective now                EDUCATION NEEDS:   No education needs have been identified at this time  Skin:  Skin  Assessment: Reviewed RN Assessment  Last BM:  Unknown  Height:   Ht Readings from Last 1 Encounters:  04/21/20 5' 2"  (1.575 m)   Weight:   Wt Readings from Last 1 Encounters:  04/21/20 50.8 kg   BMI:  Body mass index is 20.49 kg/m.  Estimated Nutritional Needs:   Kcal:  1400-1600  Protein:  75-85 grams  Fluid:  1.2-1.5 L/day  Jacklynn Barnacle, MS, RD, LDN Pager number available on Amion

## 2020-04-22 NOTE — Plan of Care (Signed)
  Problem: Health Behavior/Discharge Planning: Goal: Ability to manage health-related needs will improve Outcome: Progressing   Problem: Clinical Measurements: Goal: Ability to maintain clinical measurements within normal limits will improve Outcome: Progressing Goal: Will remain free from infection Outcome: Progressing Goal: Diagnostic test results will improve Outcome: Progressing   

## 2020-04-23 DIAGNOSIS — E43 Unspecified severe protein-calorie malnutrition: Secondary | ICD-10-CM | POA: Insufficient documentation

## 2020-04-23 DIAGNOSIS — I482 Chronic atrial fibrillation, unspecified: Secondary | ICD-10-CM | POA: Diagnosis not present

## 2020-04-23 DIAGNOSIS — E119 Type 2 diabetes mellitus without complications: Secondary | ICD-10-CM | POA: Diagnosis not present

## 2020-04-23 DIAGNOSIS — S72001A Fracture of unspecified part of neck of right femur, initial encounter for closed fracture: Secondary | ICD-10-CM | POA: Diagnosis not present

## 2020-04-23 LAB — GLUCOSE, CAPILLARY
Glucose-Capillary: 167 mg/dL — ABNORMAL HIGH (ref 70–99)
Glucose-Capillary: 241 mg/dL — ABNORMAL HIGH (ref 70–99)
Glucose-Capillary: 200 mg/dL — ABNORMAL HIGH (ref 70–99)
Glucose-Capillary: 199 mg/dL — ABNORMAL HIGH (ref 70–99)

## 2020-04-23 MED ORDER — CEFAZOLIN SODIUM-DEXTROSE 2-4 GM/100ML-% IV SOLN
2.0000 g | INTRAVENOUS | Status: AC
  Administered 2020-04-24: 2 g via INTRAVENOUS
  Filled 2020-04-23: qty 100

## 2020-04-23 MED ORDER — HYDRALAZINE HCL 20 MG/ML IJ SOLN: 10.0000 mg | INTRAMUSCULAR | Status: DC | PRN

## 2020-04-23 MED ORDER — AMLODIPINE BESYLATE 5 MG PO TABS
5.0000 mg | ORAL_TABLET | Freq: Every day | ORAL | Status: DC
  Administered 2020-04-23 – 2020-05-01 (×8): 5 mg via ORAL
  Filled 2020-04-23 (×8): qty 1

## 2020-04-23 MED ORDER — CEFAZOLIN SODIUM-DEXTROSE 2-4 GM/100ML-% IV SOLN
2.0000 g | INTRAVENOUS | Status: DC
  Filled 2020-04-23: qty 100

## 2020-04-23 MED ORDER — SODIUM CHLORIDE 0.9 % IV SOLN
1.0000 g | INTRAVENOUS | Status: DC
  Administered 2020-04-23: 1 g via INTRAVENOUS
  Filled 2020-04-23 (×3): qty 10

## 2020-04-23 NOTE — Progress Notes (Signed)
PROGRESS NOTE    Suzanne Cantu  WPY:[id number] DOB: 08-07-1926 DOA: 04/21/2020 PCP: Corky Downs, MD   Brief Narrative:  86 year old female history of A. fib on Eliquis, CVA, seizure disorder who presents with a mechanical fall and right hip pain.  No loss of consciousness.  On admission x-ray shows severely comminuted and displaced intertrochanteric fracture right proximal femur.  Orthopedics consulted from admission.  Plan for operative fixation during this admission however patient is on Eliquis and we need to wait 3 days prior to administering spinal anesthesia.  Per orthopedics surgery scheduled for Saturday, 04/24/2020  Pain control improved on Multimodal regimen.  On IV fluids.  Assessment & Plan:   Principal Problem:   Closed right hip fracture (HCC) Active Problems:   Essential hypertension   Controlled type 2 diabetes mellitus without complication, without long-term current use of insulin (HCC)   History of CVA (cerebrovascular accident)   Fall   Atrial fibrillation, chronic (HCC)   Protein-calorie malnutrition, severe  Closed right hip fracture (HCC) Severe community fracture seen on x-ray.  Severe pain on admission.  Now on multimodal regimen with better control. Orthopedic surgeon, Dr. Martha Clan was consulted.  Plan for operative fixation Saturday, 04/24/2020 Per anesthesia patient cannot get spinal anesthetic until 3 days after discontinuation of Eliquis Plan: As needed pain control Continue intravenous fluids As needed antiemetics Plan for OR 04/24/2020 Anticoagulation on hold.  No need for bridging  Essential hypertension: -IV hydralazine as needed -Irbesartan -Add amlodipine 5 mg daily for better control  Controlled type 2 diabetes mellitus without complication, without long-term current use of insulin (HCC)  Recent A1c 6.5, well controlled.   Patient taking Metformin at home -Sliding scale insulin -Hold Metformin  History of CVA  (cerebrovascular accident) -Patient is Eliquis for A. fib which is on hold  Fall:  -PT/OT starting postoperative day #1  Chronic A fib: HR 78. -Hold Eliquis -No indication for bridging therapy    Perioperative Cardiac Risk:   He has multiple comorbidities, including hypertension, hyperlipidemia, diabetes mellitus, stroke, atrial fibrillation on Eliquis, but no history of CHF or CAD. Currently patient is active and partially independent of his ADLs, IADLs. She uses walker for ambulation.  Patient does not have chest pain, shortness of breath, palpitation, leg edema.  No signs of CHF currently.  EKG has no acute change. At this time point, no further work up is needed. His GUPTA score perioperative myocardial infarction or cardaic arrest is 1.93%.   DVT prophylaxis: SCD Code Status: Full Family Communication: Daughter at bedside 04/23/2020 Disposition Plan: Status is: Inpatient  Remains inpatient appropriate because:Inpatient level of care appropriate due to severity of illness   Dispo: The patient is from: Home              Anticipated d/c is to: SNF              Anticipated d/c date is: > 3 days              Patient currently is not medically stable to d/c.   Recommended hip fracture.  Plan for OR 04/24/2020      Consultants:   Orthopedics  Procedures:   None  Antimicrobials:   None   Subjective: Seen and examined.  Improved pain control this morning.  Tolerating p.o.  Objective: Vitals:   04/22/20 0807 04/22/20 1555 04/22/20 2336 04/23/20 0741  BP:  (!) 154/81 140/89 (!) 170/74  Pulse:  93 93 93  Resp:  15 20 15  Temp:  98.6 F (37 C) 98.4 F (36.9 C) 98.2 F (36.8 C)  TempSrc:  Oral Oral Oral  SpO2: 94% 96% 95% 96%  Weight:      Height:        Intake/Output Summary (Last 24 hours) at 04/23/2020 1037 Last data filed at 04/23/2020 0441 Gross per 24 hour  Intake 519.15 ml  Output 1550 ml  Net -1030.85 ml   Filed Weights   04/21/20 0919   Weight: 50.8 kg    Examination:  General exam: No acute distress.  Appears frail Respiratory system: Normal work of breathing.  Clear to auscultation.  Room air Cardiovascular system: Regular rate, irregular rhythm, no murmurs Gastrointestinal system: Scaphoid, nontender, nondistended, normal bowel sounds  Central nervous system: Alert, oriented to person and place.  No focal deficits  extremities: RLE painful to touch.  Shortened externally rotated.  Distal pulses intact Skin: No rashes, lesions or ulcers Psychiatry: Judgement and insight appear normal. Mood & affect appropriate.     Data Reviewed: I have personally reviewed following labs and imaging studies  CBC: Recent Labs  Lab 04/21/20 1043 04/22/20 0627  WBC 9.6 13.4*  NEUTROABS 7.9*  --   HGB 12.5 12.0  HCT 37.0 34.8*  MCV 89.4 89.2  PLT 202 195   Basic Metabolic Panel: Recent Labs  Lab 04/21/20 1043 04/22/20 0627  NA 138 132*  K 3.8 3.9  CL 100 96*  CO2 27 26  GLUCOSE 196* 203*  BUN 7* 11  CREATININE 0.69 0.68  CALCIUM 9.5 9.3   GFR: Estimated Creatinine Clearance: 36.2 mL/min (by C-G formula based on SCr of 0.68 mg/dL). Liver Function Tests: No results for input(s): AST, ALT, ALKPHOS, BILITOT, PROT, ALBUMIN in the last 168 hours. No results for input(s): LIPASE, AMYLASE in the last 168 hours. No results for input(s): AMMONIA in the last 168 hours. Coagulation Profile: Recent Labs  Lab 04/21/20 1521  INR 1.1   Cardiac Enzymes: No results for input(s): CKTOTAL, CKMB, CKMBINDEX, TROPONINI in the last 168 hours. BNP (last 3 results) No results for input(s): PROBNP in the last 8760 hours. HbA1C: Recent Labs    04/21/20 1521  HGBA1C 7.3*   CBG: Recent Labs  Lab 04/22/20 1200 04/22/20 1631 04/22/20 2116 04/22/20 2346 04/23/20 0742  GLUCAP 223* 182* 198* 207* 167*   Lipid Profile: No results for input(s): CHOL, HDL, LDLCALC, TRIG, CHOLHDL, LDLDIRECT in the last 72 hours. Thyroid  Function Tests: No results for input(s): TSH, T4TOTAL, FREET4, T3FREE, THYROIDAB in the last 72 hours. Anemia Panel: No results for input(s): VITAMINB12, FOLATE, FERRITIN, TIBC, IRON, RETICCTPCT in the last 72 hours. Sepsis Labs: No results for input(s): PROCALCITON, LATICACIDVEN in the last 168 hours.  Recent Results (from the past 240 hour(s))  Respiratory Panel by RT PCR (Flu A&B, Covid) - Nasopharyngeal Swab     Status: None   Collection Time: 04/21/20 10:43 AM   Specimen: Nasopharyngeal Swab  Result Value Ref Range Status   SARS Coronavirus 2 by RT PCR NEGATIVE NEGATIVE Final    Comment: (NOTE) SARS-CoV-2 target nucleic acids are NOT DETECTED.  The SARS-CoV-2 RNA is generally detectable in upper respiratoy specimens during the acute phase of infection. The lowest concentration of SARS-CoV-2 viral copies this assay can detect is 131 copies/mL. A negative result does not preclude SARS-Cov-2 infection and should not be used as the sole basis for treatment or other patient management decisions. A negative result may occur with  improper specimen collection/handling, submission of  specimen other than nasopharyngeal swab, presence of viral mutation(s) within the areas targeted by this assay, and inadequate number of viral copies (<131 copies/mL). A negative result must be combined with clinical observations, patient history, and epidemiological information. The expected result is Negative.  Fact Sheet for Patients:  https://www.moore.com/  Fact Sheet for Healthcare Providers:  https://www.young.biz/  This test is no t yet approved or cleared by the [place] FDA and  has been authorized for detection and/or diagnosis of SARS-CoV-2 by FDA under an Emergency Use Authorization (EUA). This EUA will remain  in effect (meaning this test can be used) for the duration of the COVID-19 declaration under Section 564(b)(1) of the Act, 21  U.S.C. section 360bbb-3(b)(1), unless the authorization is terminated or revoked sooner.     Influenza A by PCR NEGATIVE NEGATIVE Final   Influenza B by PCR NEGATIVE NEGATIVE Final    Comment: (NOTE) The Xpert Xpress SARS-CoV-2/FLU/RSV assay is intended as an aid in  the diagnosis of influenza from Nasopharyngeal swab specimens and  should not be used as a sole basis for treatment. Nasal washings and  aspirates are unacceptable for Xpert Xpress SARS-CoV-2/FLU/RSV  testing.  Fact Sheet for Patients: https://www.moore.com/  Fact Sheet for Healthcare Providers: https://www.young.biz/  This test is not yet approved or cleared by the [place] FDA and  has been authorized for detection and/or diagnosis of SARS-CoV-2 by  FDA under an Emergency Use Authorization (EUA). This EUA will remain  in effect (meaning this test can be used) for the duration of the  Covid-19 declaration under Section 564(b)(1) of the Act, 21  U.S.C. section 360bbb-3(b)(1), unless the authorization is  terminated or revoked. Performed at [facility], [address], [place], [place] [address]          Radiology Studies: CT Head Wo Contrast  Result Date: 04/21/2020 CLINICAL DATA:  Fall, RIGHT hip deformity with rotation patient on blood thinners. EXAM: CT HEAD WITHOUT CONTRAST CT CERVICAL SPINE WITHOUT CONTRAST TECHNIQUE: Multidetector CT imaging of the head and cervical spine was performed following the standard protocol without intravenous contrast. Multiplanar CT image reconstructions of the cervical spine were also generated. COMPARISON:  Nov 25, 2019 FINDINGS: CT HEAD FINDINGS Brain: No evidence of acute infarction, hemorrhage, hydrocephalus, extra-axial collection or mass lesion/mass effect. Signs of atrophy and chronic microvascular ischemic change as before. Vascular: No hyperdense vessel or unexpected calcification. Skull: Normal. Negative for  fracture or focal lesion. Sinuses/Orbits: No acute finding. Other: None. CT CERVICAL SPINE FINDINGS Alignment: Increased lordosis of the cervical spine is unchanged compared to prior imaging Skull base and vertebrae: No acute fracture. No primary bone lesion or focal pathologic process. Soft tissues and spinal canal: No prevertebral fluid or swelling. No visible canal hematoma. Disc levels: Multilevel degenerative change throughout the cervical spine greatest at C5-6 and C6-7 with near complete loss of the disc space at these levels and signs of uncovertebral degenerative spurring. Facet arthropathy throughout the cervical spine greatest at C3-4 on the RIGHT where there is ankylosis of facets with facet hypertrophy at C4-5 on the LEFT. Upper chest: Biapical pleural and parenchymal scarring is unchanged. Other: None IMPRESSION: 1. No acute intracranial abnormality. 2. Signs of atrophy and chronic microvascular ischemic change as before. 3. No evidence for acute fracture or subluxation of the cervical spine. 4. Multilevel degenerative change and facet arthropathy as described. Electronically Signed   By: Donzetta Kohut M.D.   On: 04/21/2020 11:38   CT Cervical Spine Wo Contrast  Result Date: 04/21/2020 CLINICAL DATA:  Fall, RIGHT hip deformity with rotation patient on blood thinners. EXAM: CT HEAD WITHOUT CONTRAST CT CERVICAL SPINE WITHOUT CONTRAST TECHNIQUE: Multidetector CT imaging of the head and cervical spine was performed following the standard protocol without intravenous contrast. Multiplanar CT image reconstructions of the cervical spine were also generated. COMPARISON:  Nov 25, 2019 FINDINGS: CT HEAD FINDINGS Brain: No evidence of acute infarction, hemorrhage, hydrocephalus, extra-axial collection or mass lesion/mass effect. Signs of atrophy and chronic microvascular ischemic change as before. Vascular: No hyperdense vessel or unexpected calcification. Skull: Normal. Negative for fracture or focal  lesion. Sinuses/Orbits: No acute finding. Other: None. CT CERVICAL SPINE FINDINGS Alignment: Increased lordosis of the cervical spine is unchanged compared to prior imaging Skull base and vertebrae: No acute fracture. No primary bone lesion or focal pathologic process. Soft tissues and spinal canal: No prevertebral fluid or swelling. No visible canal hematoma. Disc levels: Multilevel degenerative change throughout the cervical spine greatest at C5-6 and C6-7 with near complete loss of the disc space at these levels and signs of uncovertebral degenerative spurring. Facet arthropathy throughout the cervical spine greatest at C3-4 on the RIGHT where there is ankylosis of facets with facet hypertrophy at C4-5 on the LEFT. Upper chest: Biapical pleural and parenchymal scarring is unchanged. Other: None IMPRESSION: 1. No acute intracranial abnormality. 2. Signs of atrophy and chronic microvascular ischemic change as before. 3. No evidence for acute fracture or subluxation of the cervical spine. 4. Multilevel degenerative change and facet arthropathy as described. Electronically Signed   By: Donzetta Kohut M.D.   On: 04/21/2020 11:38        Scheduled Meds: . amLODipine  5 mg Oral Daily  . Chlorhexidine Gluconate Cloth  6 each Topical Daily  . cholecalciferol  1,000 Units Oral QPC supper  . feeding supplement (GLUCERNA SHAKE)  237 mL Oral TID BM  . gabapentin  300 mg Oral TID  . insulin aspart  0-5 Units Subcutaneous QHS  . insulin aspart  0-9 Units Subcutaneous TID WC  . irbesartan  75 mg Oral Daily  . ketorolac  15 mg Intravenous Q6H  . latanoprost  1 drop Both Eyes QHS  . multivitamin-lutein  1 capsule Oral BID  . vitamin B-12  500 mcg Oral Daily   Continuous Infusions: . sodium chloride 75 mL/hr at 04/23/20 0927  . [START ON 04/24/2020]  ceFAZolin (ANCEF) IV    . cefTRIAXone (ROCEPHIN)  IV 1 g (04/23/20 0929)     LOS: 2 days    Time spent: 25 minutes    Tresa Moore, MD [facility] Pager [phone]  If 7PM-7AM, please contact night-coverage 04/23/2020, 10:37 AM

## 2020-04-23 NOTE — Plan of Care (Signed)
  Problem: Health Behavior/Discharge Planning: Goal: Ability to manage health-related needs will improve Outcome: Progressing   Problem: Clinical Measurements: Goal: Ability to maintain clinical measurements within normal limits will improve Outcome: Progressing Goal: Will remain free from infection Outcome: Progressing Goal: Respiratory complications will improve Outcome: Progressing Goal: Cardiovascular complication will be avoided Outcome: Progressing   

## 2020-04-23 NOTE — Care Management Important Message (Signed)
Important Message  Patient Details  Name: Suzanne Cantu MRN: [id number] Date of Birth: 1927/03/17   Medicare Important Message Given:  Yes     [name] A Jaz Laningham 04/23/2020, 11:33 AM

## 2020-04-24 ENCOUNTER — Encounter
Admission: EM | Disposition: A | Discharge status: Skilled Nursing Facility | Payer: Self-pay | Source: Home / Self Care | Attending: Internal Medicine

## 2020-04-24 ENCOUNTER — Inpatient Hospital Stay: Payer: Medicare Other

## 2020-04-24 ENCOUNTER — Encounter: Payer: Self-pay | Admitting: Internal Medicine

## 2020-04-24 ENCOUNTER — Inpatient Hospital Stay: Payer: Medicare Other | Admitting: Anesthesiology

## 2020-04-24 DIAGNOSIS — S72001A Fracture of unspecified part of neck of right femur, initial encounter for closed fracture: Secondary | ICD-10-CM | POA: Diagnosis not present

## 2020-04-24 DIAGNOSIS — I10 Essential (primary) hypertension: Secondary | ICD-10-CM | POA: Diagnosis not present

## 2020-04-24 DIAGNOSIS — I482 Chronic atrial fibrillation, unspecified: Secondary | ICD-10-CM | POA: Diagnosis not present

## 2020-04-24 DIAGNOSIS — E119 Type 2 diabetes mellitus without complications: Secondary | ICD-10-CM | POA: Diagnosis not present

## 2020-04-24 HISTORY — PX: INTRAMEDULLARY (IM) NAIL INTERTROCHANTERIC: SHX5875

## 2020-04-24 LAB — BASIC METABOLIC PANEL
Anion gap: 7 (ref 5–15)
BUN: 10 mg/dL (ref 8–23)
CO2: 25 mmol/L (ref 22–32)
Calcium: 8.5 mg/dL — ABNORMAL LOW (ref 8.9–10.3)
Chloride: 101 mmol/L (ref 98–111)
Creatinine, Ser: 0.53 mg/dL (ref 0.44–1.00)
GFR, Estimated: >60 mL/min (ref >60)
Glucose, Bld: 216 mg/dL — ABNORMAL HIGH (ref 70–99)
Potassium: 4.1 mmol/L (ref 3.5–5.1)
Sodium: 133 mmol/L — ABNORMAL LOW (ref 135–145)

## 2020-04-24 LAB — CBC WITH DIFFERENTIAL/PLATELET
Abs Immature Granulocytes: 0.03 10*3/uL (ref 0.00–0.07)
Basophils Absolute: 0 10*3/uL (ref 0.0–0.1)
Basophils Relative: 0 %
Eosinophils Absolute: 0.2 10*3/uL (ref 0.0–0.5)
Eosinophils Relative: 2 %
HCT: 32 % — ABNORMAL LOW (ref 36.0–46.0)
Hemoglobin: 11 g/dL — ABNORMAL LOW (ref 12.0–15.0)
Immature Granulocytes: 0 %
Lymphocytes Relative: 13 %
Lymphs Abs: 1.1 10*3/uL (ref 0.7–4.0)
MCH: 30.8 pg (ref 26.0–34.0)
MCHC: 34.4 g/dL (ref 30.0–36.0)
MCV: 89.6 fL (ref 80.0–100.0)
Monocytes Absolute: 0.7 10*3/uL (ref 0.1–1.0)
Monocytes Relative: 9 %
Neutro Abs: 6.5 10*3/uL (ref 1.7–7.7)
Neutrophils Relative %: 76 %
Platelets: 126 10*3/uL — ABNORMAL LOW (ref 150–400)
RBC: 3.57 MIL/uL — ABNORMAL LOW (ref 3.87–5.11)
RDW: 12.8 % (ref 11.5–15.5)
WBC: 8.5 10*3/uL (ref 4.0–10.5)
nRBC: 0 % (ref 0.0–0.2)

## 2020-04-24 LAB — GLUCOSE, CAPILLARY
Glucose-Capillary: 211 mg/dL — ABNORMAL HIGH (ref 70–99)
Glucose-Capillary: 182 mg/dL — ABNORMAL HIGH (ref 70–99)
Glucose-Capillary: 152 mg/dL — ABNORMAL HIGH (ref 70–99)
Glucose-Capillary: 199 mg/dL — ABNORMAL HIGH (ref 70–99)
Glucose-Capillary: 155 mg/dL — ABNORMAL HIGH (ref 70–99)

## 2020-04-24 IMAGING — XA DG HIP (WITH PELVIS) OPERATIVE*R*
4 series · 4 of 4 positions shown · non-contrast
Comparison: None.

CLINICAL DATA: RIGHT IM nail in OR.

EXAM:
OPERATIVE RIGHT HIP (WITH PELVIS IF PERFORMED) 2 VIEWS
TECHNIQUE: Fluoroscopic spot image(s) were submitted for interpretation
post-operatively.

[Series 17: ortho standard · 1 of 1 slices shown (1 of 4)]
[im 1/1]
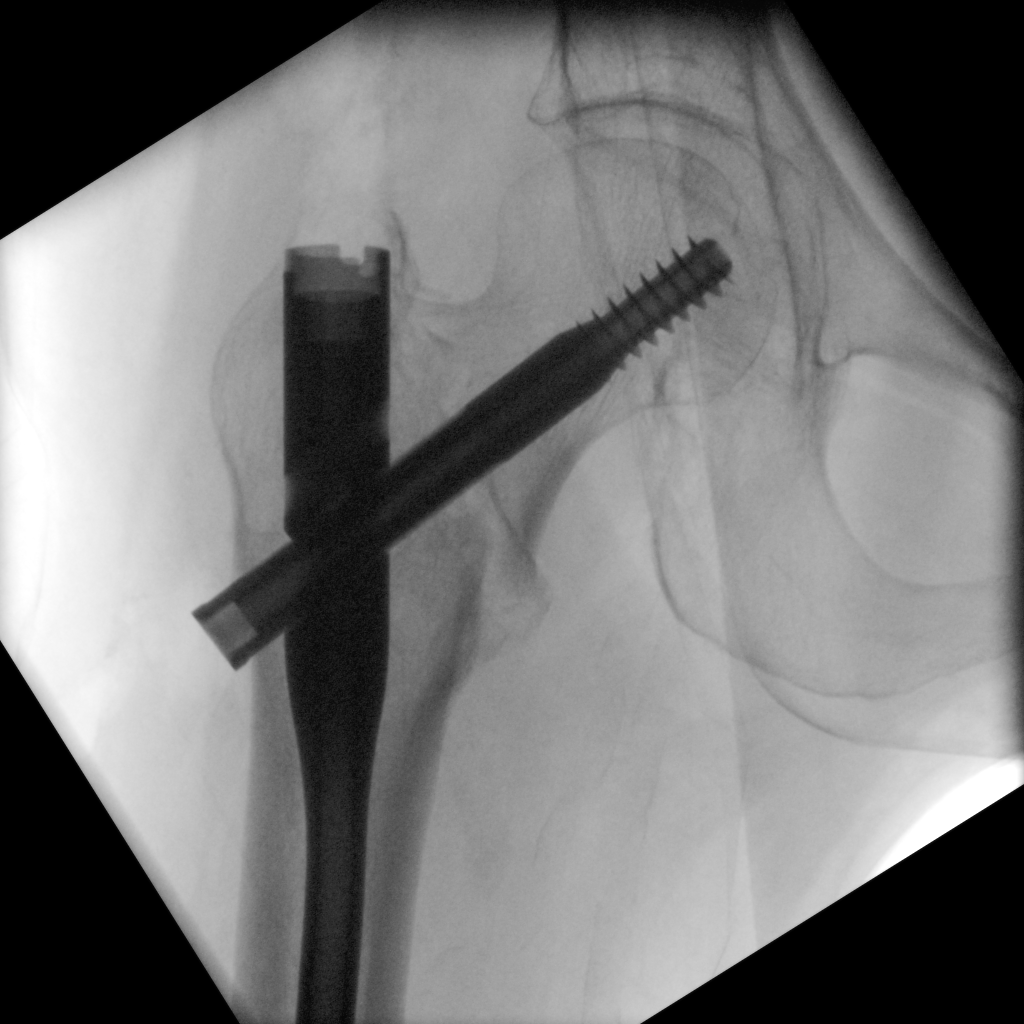

[Series 18: ortho standard · 1 of 1 slices shown (2 of 4)]
[im 1/1]
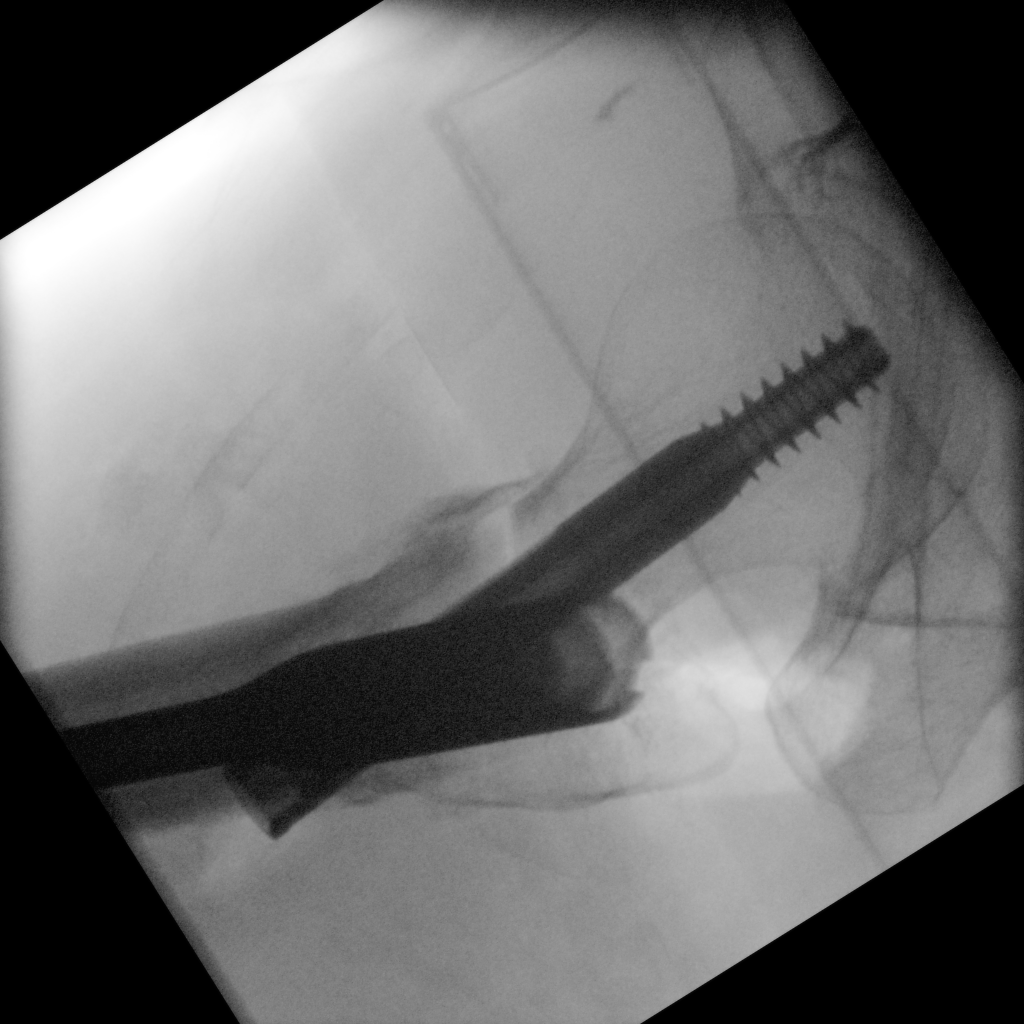

[Series 19: ortho standard · 1 of 1 slices shown (3 of 4)]
[im 1/1]
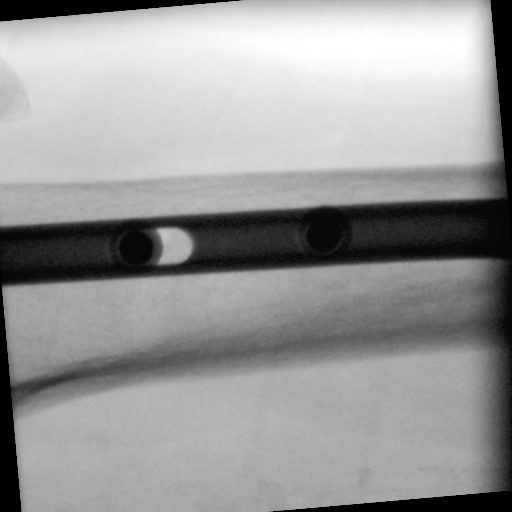

[Series 20: ortho standard · 1 of 1 slices shown (4 of 4)]
[im 1/1]
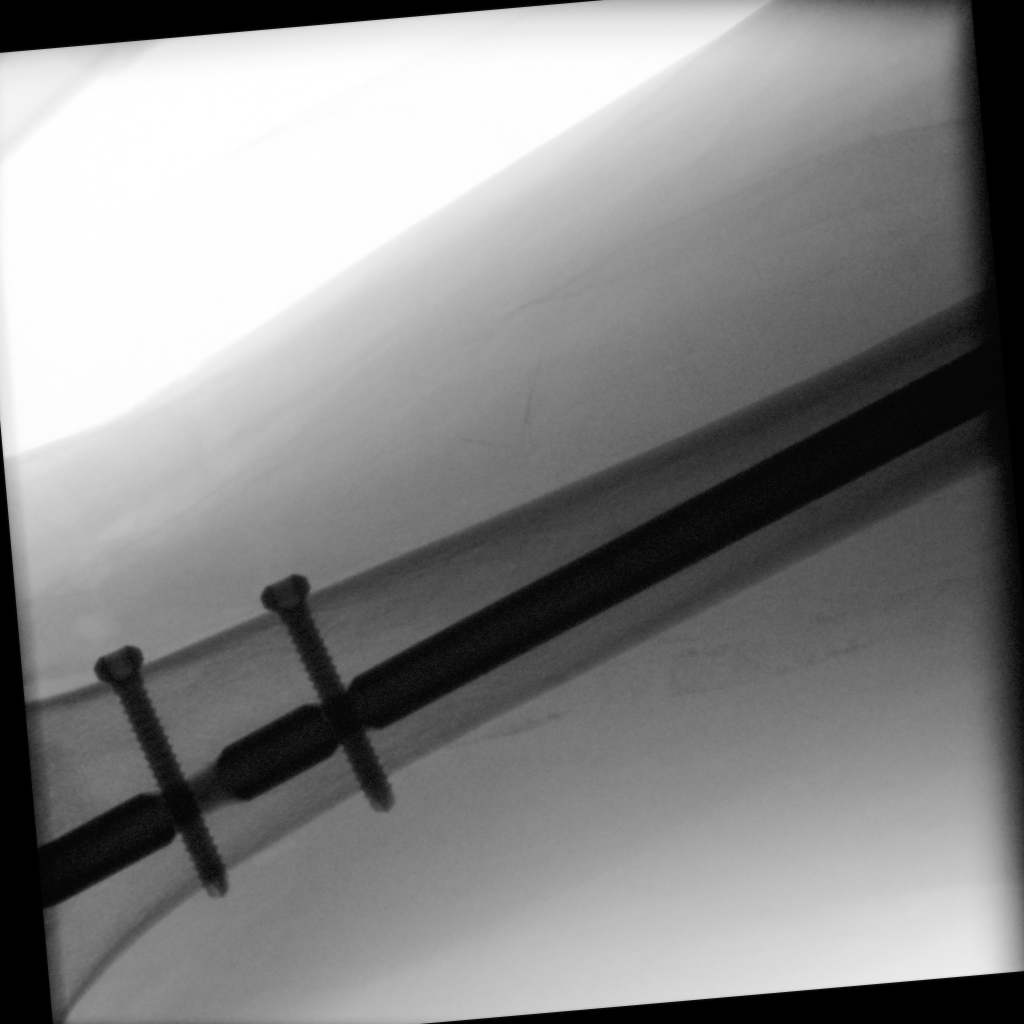

[4 of 4 positions shown; findings below may reference images not displayed]

FINDINGS: Intraoperative fluoroscopic spot images of the RIGHT hip are
provided showing intramedullary nail within the proximal RIGHT femur
with associated dynamic screw traversing the RIGHT femoral neck.
Hardware appears intact and appropriately positioned. Osseous
alignment is anatomic.

Fluoroscopy was provided for 1 minutes.
IMPRESSION: Intraoperative films of the RIGHT hip. No evidence of surgical
complicating feature.

## 2020-04-24 IMAGING — DX DG FEMUR 2+V*R*
4 series · 4 of 4 positions shown · non-contrast
Comparison: [DATE].

CLINICAL DATA: Status post intramedullary rod fixation of right
femoral fracture.

EXAM:
RIGHT FEMUR 2 VIEWS

[femur ap (1 of 2)]
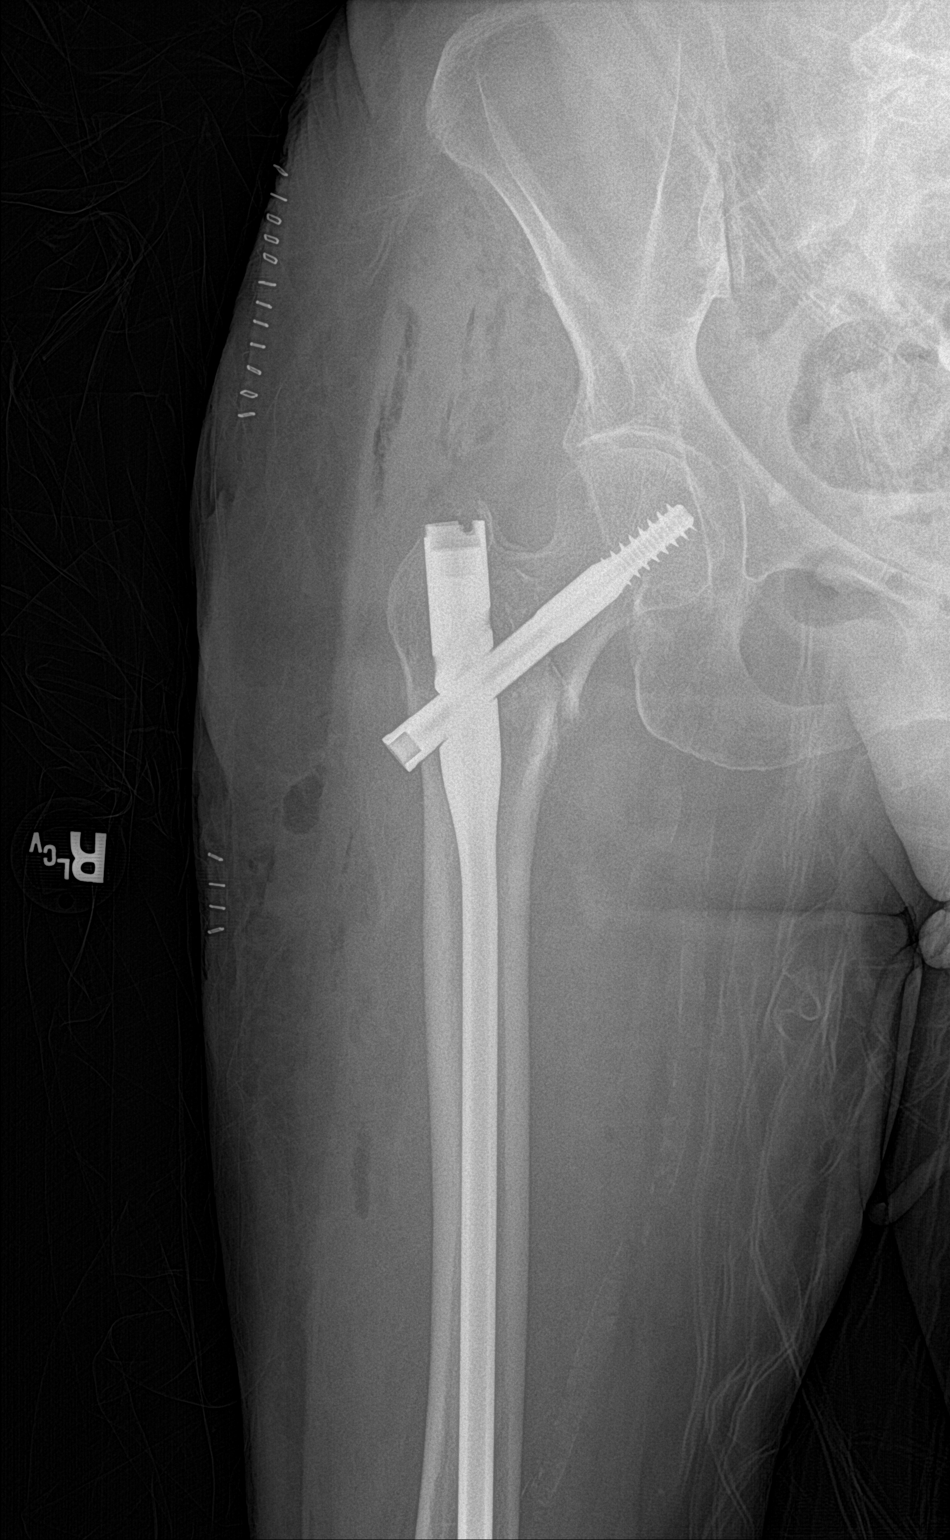

[femur ap (2 of 2)]
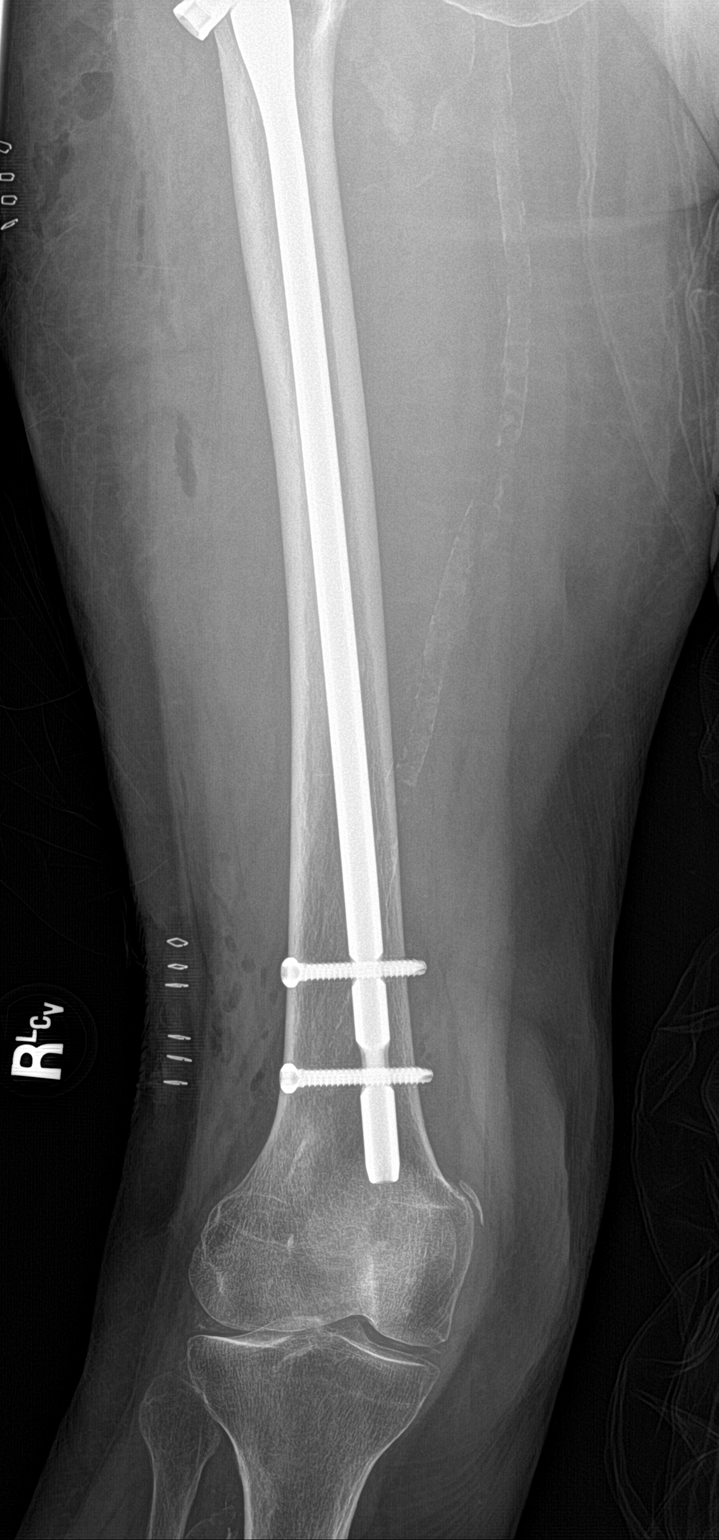

[femur lat (1 of 2)]
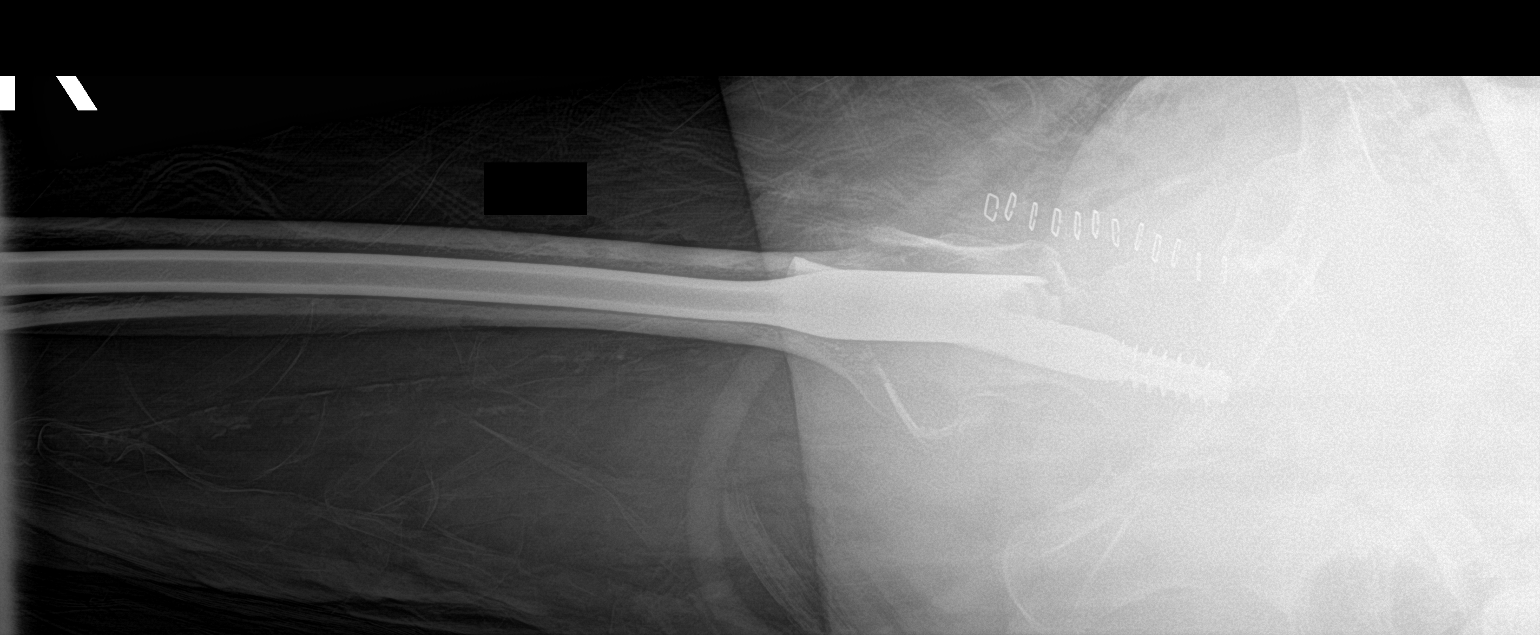

[femur lat (2 of 2)]
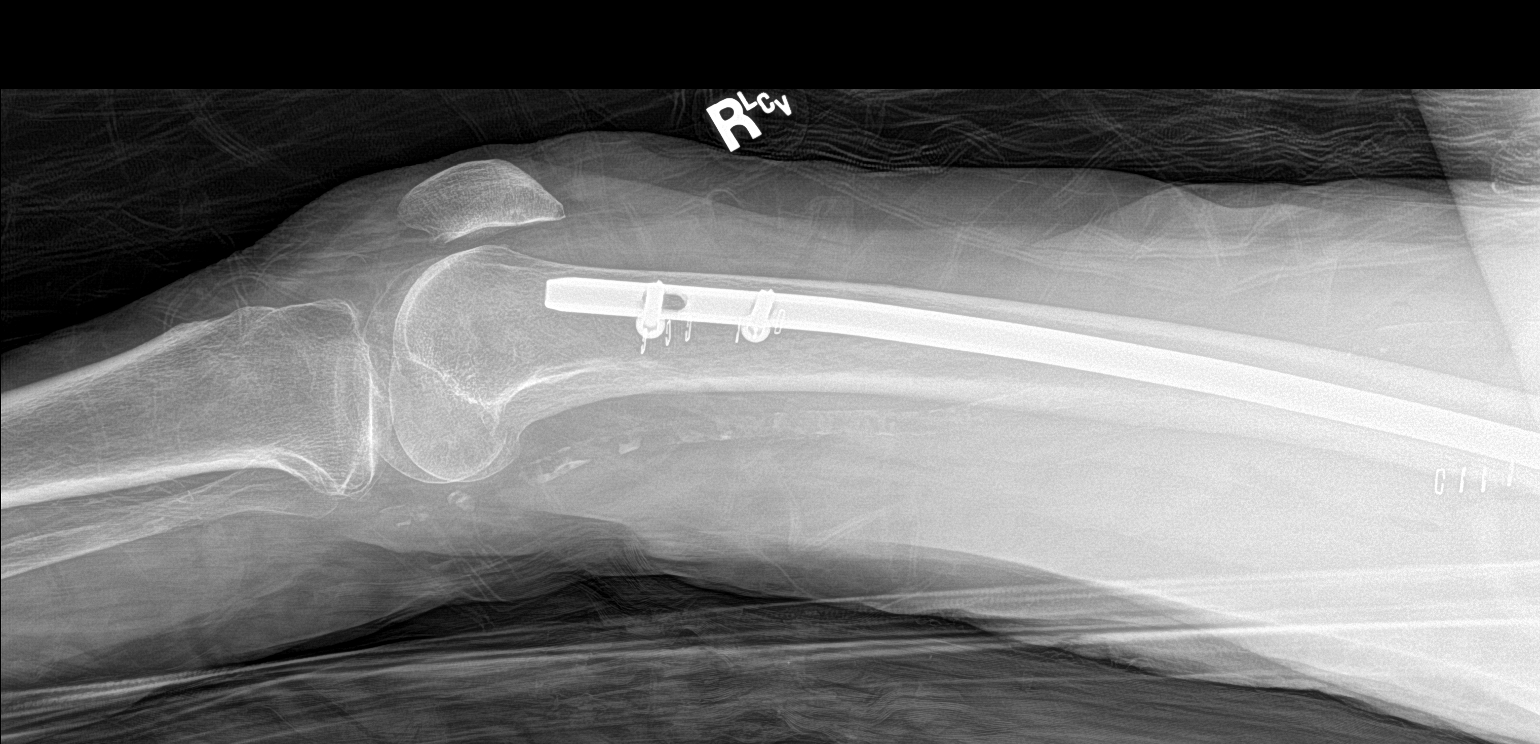

[4 of 4 positions shown; findings below may reference images not displayed]

FINDINGS: Status post intramedullary rod fixation of proximal right femoral
intertrochanteric fracture. Good alignment of fracture components is
noted. Expected postoperative changes are seen in the surrounding
soft tissues. Vascular calcifications are noted.
IMPRESSION: Status post intramedullary rod fixation of proximal right femoral
intertrochanteric fracture.

## 2020-04-24 SURGERY — FIXATION, FRACTURE, INTERTROCHANTERIC, WITH INTRAMEDULLARY ROD
Anesthesia: Spinal | Site: Hip | Laterality: Right

## 2020-04-24 MED ORDER — HYDROCODONE-ACETAMINOPHEN 5-325 MG PO TABS: 1.0000 | ORAL_TABLET | ORAL | Status: DC | PRN

## 2020-04-24 MED ORDER — DOCUSATE SODIUM 100 MG PO CAPS
100.0000 mg | ORAL_CAPSULE | Freq: Two times a day (BID) | ORAL | Status: DC
  Administered 2020-04-24 – 2020-04-30 (×9): 100 mg via ORAL
  Filled 2020-04-24 (×12): qty 1

## 2020-04-24 MED ORDER — PROPOFOL 10 MG/ML IV BOLUS
INTRAVENOUS | Status: DC | PRN
  Administered 2020-04-24: 20 mg via INTRAVENOUS

## 2020-04-24 MED ORDER — PHENYLEPHRINE HCL (PRESSORS) 10 MG/ML IV SOLN
INTRAVENOUS | Status: DC | PRN
  Administered 2020-04-24: 100 ug via INTRAVENOUS

## 2020-04-24 MED ORDER — METOPROLOL TARTRATE 5 MG/5ML IV SOLN: 5.0000 mg | INTRAVENOUS | Status: DC | PRN

## 2020-04-24 MED ORDER — PROPOFOL 500 MG/50ML IV EMUL
INTRAVENOUS | Status: AC
  Filled 2020-04-24: qty 50

## 2020-04-24 MED ORDER — BUPIVACAINE HCL (PF) 0.5 % IJ SOLN
INTRAMUSCULAR | Status: DC | PRN
  Administered 2020-04-24: 2.5 mL

## 2020-04-24 MED ORDER — CEFAZOLIN SODIUM-DEXTROSE 1-4 GM/50ML-% IV SOLN
1.0000 g | Freq: Four times a day (QID) | INTRAVENOUS | Status: AC
  Administered 2020-04-24 (×2): 1 g via INTRAVENOUS
  Filled 2020-04-24 (×2): qty 50

## 2020-04-24 MED ORDER — ACETAMINOPHEN 325 MG PO TABS
325.0000 mg | ORAL_TABLET | Freq: Four times a day (QID) | ORAL | Status: DC | PRN
  Administered 2020-04-26 – 2020-04-29 (×5): 650 mg via ORAL
  Filled 2020-04-24 (×5): qty 2

## 2020-04-24 MED ORDER — APIXABAN 2.5 MG PO TABS
2.5000 mg | ORAL_TABLET | Freq: Two times a day (BID) | ORAL | Status: DC
  Administered 2020-04-25 – 2020-05-01 (×12): 2.5 mg via ORAL
  Filled 2020-04-24 (×12): qty 1

## 2020-04-24 MED ORDER — SEVOFLURANE IN SOLN
RESPIRATORY_TRACT | Status: AC
  Filled 2020-04-24: qty 250

## 2020-04-24 MED ORDER — LACTATED RINGERS IV SOLN: INTRAVENOUS | Status: DC | PRN

## 2020-04-24 MED ORDER — CEFAZOLIN SODIUM 1 G IJ SOLR
INTRAMUSCULAR | Status: AC
  Filled 2020-04-24: qty 20

## 2020-04-24 MED ORDER — POLYETHYLENE GLYCOL 3350 17 G PO PACK
17.0000 g | PACK | Freq: Every day | ORAL | Status: DC | PRN
  Administered 2020-04-28: 17 g via ORAL
  Filled 2020-04-24: qty 1

## 2020-04-24 MED ORDER — MAGNESIUM CITRATE PO SOLN
1.0000 | Freq: Once | ORAL | Status: DC | PRN
  Filled 2020-04-24: qty 296

## 2020-04-24 MED ORDER — SODIUM CHLORIDE 0.9 % IV SOLN
75.0000 mL/h | INTRAVENOUS | Status: DC
  Administered 2020-04-24 – 2020-04-27 (×5): 75 mL/h via INTRAVENOUS

## 2020-04-24 MED ORDER — ONDANSETRON HCL 4 MG/2ML IJ SOLN: 4.0000 mg | Freq: Once | INTRAMUSCULAR | Status: DC | PRN

## 2020-04-24 MED ORDER — PROPOFOL 10 MG/ML IV BOLUS
INTRAVENOUS | Status: AC
  Filled 2020-04-24: qty 20

## 2020-04-24 MED ORDER — SENNA 8.6 MG PO TABS
1.0000 | ORAL_TABLET | Freq: Two times a day (BID) | ORAL | Status: DC
  Administered 2020-04-24 – 2020-04-30 (×9): 8.6 mg via ORAL
  Filled 2020-04-24 (×11): qty 1

## 2020-04-24 MED ORDER — KETAMINE HCL 50 MG/ML IJ SOLN
INTRAMUSCULAR | Status: AC
  Filled 2020-04-24: qty 10

## 2020-04-24 MED ORDER — ONDANSETRON HCL 4 MG/2ML IJ SOLN: 4.0000 mg | Freq: Four times a day (QID) | INTRAMUSCULAR | Status: DC | PRN

## 2020-04-24 MED ORDER — MORPHINE SULFATE (PF) 2 MG/ML IV SOLN: 0.5000 mg | INTRAVENOUS | Status: DC | PRN

## 2020-04-24 MED ORDER — GENTAMICIN SULFATE 40 MG/ML IJ SOLN
INTRAMUSCULAR | Status: DC | PRN
  Administered 2020-04-24: 80 mg

## 2020-04-24 MED ORDER — METHOCARBAMOL 1000 MG/10ML IJ SOLN
500.0000 mg | Freq: Four times a day (QID) | INTRAVENOUS | Status: DC | PRN
  Filled 2020-04-24: qty 5

## 2020-04-24 MED ORDER — GENTAMICIN SULFATE 40 MG/ML IJ SOLN
INTRAMUSCULAR | Status: AC
  Filled 2020-04-24: qty 2

## 2020-04-24 MED ORDER — SODIUM CHLORIDE 0.9 % IV SOLN
INTRAVENOUS | Status: DC | PRN
  Administered 2020-04-24: 30 ug/min via INTRAVENOUS

## 2020-04-24 MED ORDER — PROPOFOL 500 MG/50ML IV EMUL
INTRAVENOUS | Status: DC | PRN
  Administered 2020-04-24: 10 ug/kg/min via INTRAVENOUS

## 2020-04-24 MED ORDER — TRAMADOL HCL 50 MG PO TABS
50.0000 mg | ORAL_TABLET | Freq: Four times a day (QID) | ORAL | Status: DC
  Administered 2020-04-24 – 2020-04-26 (×6): 50 mg via ORAL
  Filled 2020-04-24 (×8): qty 1

## 2020-04-24 MED ORDER — ONDANSETRON HCL 4 MG PO TABS: 4.0000 mg | ORAL_TABLET | Freq: Four times a day (QID) | ORAL | Status: DC | PRN

## 2020-04-24 MED ORDER — BISACODYL 10 MG RE SUPP: 10.0000 mg | Freq: Every day | RECTAL | Status: DC | PRN

## 2020-04-24 MED ORDER — METHOCARBAMOL 500 MG PO TABS: 500.0000 mg | ORAL_TABLET | Freq: Four times a day (QID) | ORAL | Status: DC | PRN

## 2020-04-24 MED ORDER — FENTANYL CITRATE (PF) 100 MCG/2ML IJ SOLN: 25.0000 ug | INTRAMUSCULAR | Status: DC | PRN

## 2020-04-24 MED ORDER — CHLORHEXIDINE GLUCONATE CLOTH 2 % EX PADS
6.0000 | MEDICATED_PAD | Freq: Every day | CUTANEOUS | Status: DC
  Administered 2020-04-25 – 2020-04-28 (×4): 6 via TOPICAL

## 2020-04-24 MED ORDER — ALUM & MAG HYDROXIDE-SIMETH 200-200-20 MG/5ML PO SUSP: 30.0000 mL | ORAL | Status: DC | PRN

## 2020-04-24 SURGICAL SUPPLY — 48 items
BAG DECANTER FOR FLEXI CONT (MISCELLANEOUS) ×2 IMPLANT
BIT DRILL 4.3MMS DISTAL GRDTED (BIT) ×1 IMPLANT
BNDG COHESIVE 6X5 TAN STRL LF (GAUZE/BANDAGES/DRESSINGS) ×4 IMPLANT
CANISTER SUCT 1200ML W/VALVE (MISCELLANEOUS) IMPLANT
COVER WAND RF STERILE (DRAPES) ×2 IMPLANT
DRAPE 3/4 80X56 (DRAPES) ×4 IMPLANT
DRAPE SURG 17X11 SM STRL (DRAPES) ×4 IMPLANT
DRAPE U-SHAPE 47X51 STRL (DRAPES) ×2 IMPLANT
DRILL 4.3MMS DISTAL GRADUATED (BIT) ×2
DRSG OPSITE POSTOP 3X4 (GAUZE/BANDAGES/DRESSINGS) ×2 IMPLANT
DRSG OPSITE POSTOP 4X14 (GAUZE/BANDAGES/DRESSINGS) IMPLANT
DRSG OPSITE POSTOP 4X6 (GAUZE/BANDAGES/DRESSINGS) ×2 IMPLANT
DURAPREP 26ML APPLICATOR (WOUND CARE) ×2 IMPLANT
ELECT BUTTON HF 24-28F 2 30DE (ELECTRODE) ×2 IMPLANT
ELECT REM PT RETURN 9FT ADLT (ELECTROSURGICAL) ×2
ELECTRODE REM PT RTRN 9FT ADLT (ELECTROSURGICAL) ×1 IMPLANT
GAUZE XEROFORM 1X8 LF (GAUZE/BANDAGES/DRESSINGS) ×2 IMPLANT
GLOVE BIOGEL PI IND STRL 9 (GLOVE) ×1 IMPLANT
GLOVE BIOGEL PI INDICATOR 9 (GLOVE) ×1
GLOVE SURG 9.0 ORTHO LTXF (GLOVE) ×4 IMPLANT
GOWN STRL REUS TWL 2XL XL LVL4 (GOWN DISPOSABLE) ×2 IMPLANT
GOWN STRL REUS W/ TWL LRG LVL3 (GOWN DISPOSABLE) ×1 IMPLANT
GOWN STRL REUS W/TWL LRG LVL3 (GOWN DISPOSABLE) ×1
GUIDEPIN 3.2X17.5 THRD DISP (PIN) ×2 IMPLANT
GUIDEWIRE BALL NOSE 80CM (WIRE) ×2 IMPLANT
HEMOVAC 400CC 10FR (MISCELLANEOUS) IMPLANT
HFN RH 130 DEG 9MM X 360MM (Nail) ×2 IMPLANT
KIT TURNOVER CYSTO (KITS) ×2 IMPLANT
MANIFOLD NEPTUNE II (INSTRUMENTS) ×2 IMPLANT
MAT ABSORB  FLUID 56X50 GRAY (MISCELLANEOUS) ×1
MAT ABSORB FLUID 56X50 GRAY (MISCELLANEOUS) ×1 IMPLANT
NEEDLE HYPO 22GX1.5 SAFETY (NEEDLE) ×2 IMPLANT
NS IRRIG 1000ML POUR BTL (IV SOLUTION) IMPLANT
NS IRRIG 500ML POUR BTL (IV SOLUTION) ×2 IMPLANT
PACK HIP COMPR (MISCELLANEOUS) ×2 IMPLANT
PAD ARMBOARD 7.5X6 YLW CONV (MISCELLANEOUS) ×2 IMPLANT
SCREW BONE CORTICAL 5.0X38 (Screw) ×2 IMPLANT
SCREW BONE CORTICAL 5.0X40 (Screw) ×2 IMPLANT
SCREW LAG HIP NAIL 10.5X95 (Screw) ×2 IMPLANT
STAPLER SKIN PROX 35W (STAPLE) ×2 IMPLANT
SUCTION FRAZIER HANDLE 10FR (MISCELLANEOUS) ×1
SUCTION TUBE FRAZIER 10FR DISP (MISCELLANEOUS) ×1 IMPLANT
SUT VIC AB 0 CT1 36 (SUTURE) ×4 IMPLANT
SUT VIC AB 2-0 CT1 27 (SUTURE) ×1
SUT VIC AB 2-0 CT1 TAPERPNT 27 (SUTURE) ×1 IMPLANT
SUT VICRYL 0 AB UR-6 (SUTURE) ×2 IMPLANT
SYR 30ML LL (SYRINGE) ×2 IMPLANT
SYR BULB IRRIG 60ML STRL (SYRINGE) ×2 IMPLANT

## 2020-04-24 NOTE — Anesthesia Preprocedure Evaluation (Addendum)
Anesthesia Evaluation  Patient identified by MRN, date of birth, ID band Patient awake    Reviewed: Allergy & Precautions, NPO status , Patient's Chart, lab work & pertinent test results  Airway Mallampati: III       Dental   Pulmonary neg pulmonary ROS,           Cardiovascular hypertension, + dysrhythmias Atrial Fibrillation      Neuro/Psych Seizures -,  CVA negative psych ROS   GI/Hepatic negative GI ROS, Neg liver ROS,   Endo/Other  diabetes  Renal/GU negative Renal ROS  negative genitourinary   Musculoskeletal   Abdominal   Peds negative pediatric ROS (+)  Hematology negative hematology ROS (+)   Anesthesia Other Findings Past Medical History: No date: Diabetes mellitus without complication (HCC) No date: Hypertension No date: Stroke [facility])  Reproductive/Obstetrics                             Anesthesia Physical Anesthesia Plan  ASA: III  Anesthesia Plan: Spinal   Post-op Pain Management:    Induction: Intravenous  PONV Risk Score and Plan:   Airway Management Planned: Nasal Cannula  Additional Equipment:   Intra-op Plan:   Post-operative Plan:   Informed Consent: I have reviewed the patients History and Physical, chart, labs and discussed the procedure including the risks, benefits and alternatives for the proposed anesthesia with the patient or authorized representative who has indicated his/her understanding and acceptance.     Dental advisory given  Plan Discussed with: CRNA and Surgeon  Anesthesia Plan Comments:        Anesthesia Quick Evaluation

## 2020-04-24 NOTE — Progress Notes (Signed)
Sepsis screen performed and is negative. Will continue to monitor. 

## 2020-04-24 NOTE — Progress Notes (Signed)
   04/24/20 1505  Assess: MEWS Score  Temp 97.9 F (36.6 C)  BP (!) 151/80  Pulse Rate (!) 115  Resp 15  SpO2 96 %  Assess: MEWS Score  MEWS Temp 0  MEWS Systolic 0  MEWS Pulse 2  MEWS RR 0  MEWS LOC 0  MEWS Score 2  MEWS Score Color Yellow  Assess: if the MEWS score is Yellow or Red  Were vital signs taken at a resting state? Yes  Focused Assessment No change from prior assessment  Early Detection of Sepsis Score *See Row Information* Medium  MEWS guidelines implemented *See Row Information* Yes  Treat  MEWS Interventions Escalated (See documentation below)  Take Vital Signs  Increase Vital Sign Frequency  Yellow: Q 2hr X 2 then Q 4hr X 2, if remains yellow, continue Q 4hrs  Escalate  MEWS: Escalate Yellow: discuss with charge nurse/RN and consider discussing with provider and RRT  Notify: Charge Nurse/RN  Name of Charge Nurse/RN Notified Harlen Labs, RN  Date Charge Nurse/RN Notified 04/24/20  Time Charge Nurse/RN Notified 1521  Notify: Provider  Provider Name/Title Rudene Christians, MD  Date Provider Notified 04/24/20  Time Provider Notified 1521  Notification Type Page  Response Other (Comment) (MD will review the chat, no orders yet.)  Date of Provider Response 04/24/20  Time of Provider Response 1520  Document  Patient Outcome Stabilized after interventions

## 2020-04-24 NOTE — Op Note (Signed)
04/24/2020  10:44 AM  PATIENT:  Suzanne Cantu    PRE-OPERATIVE DIAGNOSIS:  Right Hip Fracture  POST-OPERATIVE DIAGNOSIS:  Same  PROCEDURE:  INTRAMEDULLARY FIXATION OF RIGHT INTERTROCHANTERIC HIP FRACTURE  SURGEON:  Thornton Park, MD  ANESTHESIA:   Spinal  EBL:  50 cc  IMPLANT:  ZIMMER BIOMET AFFIXUS NAIL 70m x 3625mwith a [id number] screw and distal interlocking screws 56m63mnd 38 mm in length.  PREOPERATIVE INDICATIONS:  Suzanne Cantu  89 y30. female with a diagnosis of Right Hip Fracture who is recommended for intramedullary fixation given the fracture displacement.  The patient is a commHydrographic surveyorbaseline.  The risks, benefits and alternatives were discussed with the patient and their family.  The risks include but are not limited to infection, bleeding requiring blood transfusion, nerve or blood vessel injury, malunion, nonunion, hardware prominence, hardware failure, leg length discrepancy or change in lower extremity rotation and need for further surgery including hardware removal with conversion to a total hip arthroplasty. Medical risks include but are not limited to DVT and pulmonary embolism, myocardial infarction, stroke, pneumonia, respiratory failure and death. The patient and their daughters understood these risks and wished to proceed with surgery.  OPERATIVE PROCEDURE:  Patient was met in the preoperative area with her daughter at the bedside.  The right hip was marked according the hospital's correct site of surgery protocol.  The patient was brought to the operating room and placed in the supine position on the fracture table. The patient received final anesthesia.  A closed reduction was performed under C-arm guidance.  The fracture reduction was confirmed on both AP and lateral views. After adequate reduction was achieved, a time out was performed to verify the patient's name, date of birth, medical record number, correct site of surgery correct  procedure to be performed. The timeout was also used to verify the patient received antibiotics and all appropriate instruments, implants and radiographic studies were available in the room. Once all in attendance were in agreement, the case began. The patient was prepped and draped in a sterile fashion.  She received preoperative antibiotics with 2 g of Ancef IV.  An incision was made proximal to the greater trochanter in line with the femur. A guidewire was placed over the tip of the greater trochanter and advanced by drill into the proximal femur to the level of the lesser trochanter.  Confirmation of the drill pin position was made on AP and lateral C-arm images.  The threaded guidepin was then overdrilled with the proximal femoral entry reamer.  A ball-tipped guidewire was then advanced down the intramedullary canal, across the fracture, and down the femoral shaft to the knee.  The ball tip guidewire's position was confirmed at both the knee and hip via C-arm imaging. A depth gauge was used to measure the length of the long nail to be used. It was measured to be 360 mm long.  Sequential reamers were then passed down the intramedullary canal to a size 11 mm.  The actual 9 mm x 360 mm nail was then inserted into the proximal femur, across the fracture site and down the femoral shaft. Its position was confirmed on AP and lateral C-arm images.  The ball tip guidewire was removed.  Once the nail was completely seated, C arm images were taken at the hip and knee to confirm adequate length and position.  The drill guide for the lag screw was placed through the guide arm  for the Affixus nail. A guidepin was then placed through this drill guide and advanced through the lateral cortex of the femur, across the fracture site and into the femoral head achieving a tip apex distance of less than 25 mm. The length of the drill pin was measured to be 95 mm, and then the drill for the lag screw was advanced through the  lateral cortex, across the fracture site and up into the femoral head to the depth of 95 mm.  The actual 95 mm lag screw was then advanced by hand into position across the fracture site into the femoral head. Its final position was confirmed on AP and lateral C-arm images. Compression was applied as traction was carefully released. The set screw in the top of the intramedullary rod was tightened by hand using a screwdriver. It was backed off a quarter turn to allow for compression at the fracture site.  The attention was then turned to placement of the distal interlocking screws. A perfect circle technique was used. 2 small stab incisions were made over the distal interlocking screw holes.  A free hand technique was used to drill both distal interlocking screws. The depth of the screw holes was measured with a depth gauge. The 38 mm and 40 mm screws were then advanced into position and tightened by hand. Final C-arm images of the entire intramedullary construct were taken in both the AP and lateral planes.   The wounds were irrigated copiously and closed with 0 Vicryl for closure of the deep fascia and 2-0 Vicryl for subcutaneous closure. The skin was approximated with staples. A dry sterile dressing was applied. I was scrubbed and present the entire case and all sharp, sponge and instrument counts were correct at the conclusion of the case. Patient was transferred to a hospital bed and brought to PACU in stable condition.  I spoke with the patient's family postoperatively in the surgical waiting room to let them know the patient was stable in the recovery room and that the surgery had been performed without complication.    Timoteo Gaul, MD

## 2020-04-24 NOTE — Anesthesia Procedure Notes (Addendum)
Spinal  Patient location during procedure: OR Start time: 04/24/2020 8:30 AM End time: 04/24/2020 8:36 AM Staffing Performed: resident/CRNA  Resident/CRNA: Johney Maine D, CRNA Preanesthetic Checklist Completed: patient identified, IV checked, site marked, risks and benefits discussed, surgical consent, monitors and equipment checked, pre-op evaluation and timeout performed Spinal Block Patient position: sitting Prep: ChloraPrep Patient monitoring: heart rate, continuous pulse ox and blood pressure Approach: midline Location: L3-4 Injection technique: single-shot Needle Needle type: Pencan  Needle gauge: 24 G Needle length: 10 cm Assessment Sensory level: T6

## 2020-04-24 NOTE — Progress Notes (Signed)
Subjective:  Patient seen in the pre-op area.  Patient denies any significant right hip pain at rest.  Her daughter is at the bedside.  Objective:   VITALS:   Vitals:   04/23/20 0741 04/23/20 1540 04/23/20 2353 04/24/20 0746  BP: (!) 170/74 (!) 146/70 (!) 172/71 (!) 158/75  Pulse: 93 (!) 101 98 97  Resp: 15 17 20 15   Temp: 98.2 F (36.8 C) 98.6 F (37 C) 98.6 F (37 C) 97.7 F (36.5 C)  TempSrc: Oral Oral Oral Oral  SpO2: 96% 96% 93% 96%  Weight:      Height:        PHYSICAL EXAM: Right lower extremity: Neurovascular intact Sensation intact distally Intact pulses distally Dorsiflexion/Plantar flexion intact No cellulitis present Compartment soft  LABS  Results for orders placed or performed during the hospital encounter of 04/21/20 (from the past 24 hour(s))  Glucose, capillary     Status: Abnormal   Collection Time: 04/23/20 11:05 AM  Result Value Ref Range   Glucose-Capillary 241 (H) 70 - 99 mg/dL  Glucose, capillary     Status: Abnormal   Collection Time: 04/23/20  4:37 PM  Result Value Ref Range   Glucose-Capillary 200 (H) 70 - 99 mg/dL  Glucose, capillary     Status: Abnormal   Collection Time: 04/23/20  9:07 PM  Result Value Ref Range   Glucose-Capillary 199 (H) 70 - 99 mg/dL   Comment 1 Notify RN   CBC with Differential/Platelet     Status: Abnormal   Collection Time: 04/24/20  3:29 AM  Result Value Ref Range   WBC 8.5 4.0 - 10.5 K/uL   RBC 3.57 (L) 3.87 - 5.11 MIL/uL   Hemoglobin 11.0 (L) 12.0 - 15.0 g/dL   HCT 04/26/20 (L) 36 - 46 %   MCV 89.6 80.0 - 100.0 fL   MCH 30.8 26.0 - 34.0 pg   MCHC 34.4 30.0 - 36.0 g/dL   RDW 16.1 09.6 - 04.5 %   Platelets 126 (L) 150 - 400 K/uL   nRBC 0.0 0.0 - 0.2 %   Neutrophils Relative % 76 %   Neutro Abs 6.5 1.7 - 7.7 K/uL   Lymphocytes Relative 13 %   Lymphs Abs 1.1 0.7 - 4.0 K/uL   Monocytes Relative 9 %   Monocytes Absolute 0.7 0.1 - 1.0 K/uL   Eosinophils Relative 2 %   Eosinophils Absolute 0.2 0.0 - 0.5  K/uL   Basophils Relative 0 %   Basophils Absolute 0.0 0.0 - 0.1 K/uL   Immature Granulocytes 0 %   Abs Immature Granulocytes 0.03 0.00 - 0.07 K/uL  Basic metabolic panel     Status: Abnormal   Collection Time: 04/24/20  3:29 AM  Result Value Ref Range   Sodium 133 (L) 135 - 145 mmol/L   Potassium 4.1 3.5 - 5.1 mmol/L   Chloride 101 98 - 111 mmol/L   CO2 25 22 - 32 mmol/L   Glucose, Bld 216 (H) 70 - 99 mg/dL   BUN 10 8 - 23 mg/dL   Creatinine, Ser 04/26/20 0.44 - 1.00 mg/dL   Calcium 8.5 (L) 8.9 - 10.3 mg/dL   GFR, Estimated 8.11 >91 mL/min   Anion gap 7 5 - 15  Glucose, capillary     Status: Abnormal   Collection Time: 04/24/20  7:50 AM  Result Value Ref Range   Glucose-Capillary 211 (H) 70 - 99 mg/dL    No results found.  Assessment/Plan: Day of Surgery  Principal Problem:   Closed right hip fracture (HCC) Active Problems:   Essential hypertension   Controlled type 2 diabetes mellitus without complication, without long-term current use of insulin (HCC)   History of CVA (cerebrovascular accident)   Fall   Atrial fibrillation, chronic (HCC)   Protein-calorie malnutrition, severe  Patient has been NPO. Eliquis on hold since admission.  Patient to undergo intramedullary fixation of right intertrochanteric hip fracture. Patient has been cleared medically for surgery.  Labs and xrays reviewed for this case. Patient to have a spinal anesthetic.    Juanell Fairly , MD 04/24/2020, 8:11 AM

## 2020-04-24 NOTE — Progress Notes (Signed)
PROGRESS NOTE    Suzanne Cantu  PPJ:[id number] DOB: 09-22-1926 DOA: 04/21/2020 PCP: Corky Downs, MD   Brief Narrative:  86 year old female history of A. fib on Eliquis, CVA, seizure disorder who presents with a mechanical fall and right hip pain.  No loss of consciousness.  On admission x-ray shows severely comminuted and displaced intertrochanteric fracture right proximal femur.  Orthopedics consulted from admission.  Plan for operative fixation during this admission however patient is on Eliquis and we need to wait 3 days prior to administering spinal anesthesia.  Per orthopedics surgery scheduled for Saturday, 04/24/2020  Pain control improved.  N.p.o. for OR 04/24/2020  Assessment & Plan:   Principal Problem:   Closed right hip fracture (HCC) Active Problems:   Essential hypertension   Controlled type 2 diabetes mellitus without complication, without long-term current use of insulin (HCC)   History of CVA (cerebrovascular accident)   Fall   Atrial fibrillation, chronic (HCC)   Protein-calorie malnutrition, severe  Closed right hip fracture (HCC) Severe community fracture seen on x-ray.  Severe pain on admission.  Now on multimodal regimen with better control. Orthopedic surgeon, Dr. Martha Clan was consulted.  Plan for operative fixation Saturday, 04/24/2020 Per anesthesia patient cannot get spinal anesthetic until 3 days after discontinuation of Eliquis Plan: As needed pain control Continue intravenous fluids As needed antiemetics OR today 04/24/2020 Anticoagulation on hold.  No need for bridging Will restart as soon as safe postoperatively  Essential hypertension: -IV hydralazine as needed -Irbesartan -Continue amlodipine 5 mg daily for better control -Ensure pain control  Controlled type 2 diabetes mellitus without complication, without long-term current use of insulin (HCC)  Recent A1c 6.5, well controlled.   Patient taking Metformin at home -Sliding  scale insulin -Hold Metformin  History of CVA (cerebrovascular accident) -Patient is Eliquis for A. fib which is on hold  Fall:  -PT/OT starting postoperative day #1  Chronic A fib: HR 78. -Hold Eliquis -No indication for bridging therapy    Perioperative Cardiac Risk:   He has multiple comorbidities, including hypertension, hyperlipidemia, diabetes mellitus, stroke, atrial fibrillation on Eliquis, but no history of CHF or CAD. Currently patient is active and partially independent of his ADLs, IADLs. She uses walker for ambulation.  Patient does not have chest pain, shortness of breath, palpitation, leg edema.  No signs of CHF currently.  EKG has no acute change. At this time point, no further work up is needed. His GUPTA score perioperative myocardial infarction or cardaic arrest is 1.93%.   DVT prophylaxis: SCD Code Status: Full Family Communication: Daughter at bedside 04/23/2020 Disposition Plan: Status is: Inpatient  Remains inpatient appropriate because:Inpatient level of care appropriate due to severity of illness   Dispo: The patient is from: Home              Anticipated d/c is to: SNF              Anticipated d/c date is: 2 days              Patient currently is not medically stable to d/c.         OR today 04/24/2020.  Anticipate 2 days until medical readiness for discharge      Consultants:   Orthopedics  Procedures:   None  Antimicrobials:   None   Subjective: Seen and examined postoperatively.  Hemodynamically stable.  On room air.  Mentating clearly.  No pain endorsed at this time.  Objective: Vitals:   04/24/20 1043 04/24/20  1100 04/24/20 1115 04/24/20 1132  BP: 120/61 133/66 (!) 151/64 (!) 152/68  Pulse: 100 98 97 97  Resp: (!) 21 (!) 23 (!) 25   Temp:  98.9 F (37.2 C)  98.2 F (36.8 C)  TempSrc:    Oral  SpO2: 96% 93% 95% 91%  Weight:      Height:        Intake/Output Summary (Last 24 hours) at 04/24/2020  1153 Last data filed at 04/24/2020 1054 Gross per 24 hour  Intake 2963.74 ml  Output 4600 ml  Net -1636.26 ml   Filed Weights   04/21/20 0919  Weight: 50.8 kg    Examination:  General exam: No acute distress.  Appears frail Respiratory system: Normal work of breathing.  Clear to auscultation.  Room air Cardiovascular system: Regular rate, irregular rhythm, no murmurs Gastrointestinal system: Scaphoid, nontender, nondistended, normal bowel sounds  Central nervous system: Alert, oriented to person and place.  No focal deficits  extremities: Right lower extremity in surgical dressings.  Not removed.  Distal pulses intact  skin: No rashes, lesions or ulcers Psychiatry: Judgement and insight appear normal. Mood & affect appropriate.     Data Reviewed: I have personally reviewed following labs and imaging studies  CBC: Recent Labs  Lab 04/21/20 1043 04/22/20 0627 04/24/20 0329  WBC 9.6 13.4* 8.5  NEUTROABS 7.9*  --  6.5  HGB 12.5 12.0 11.0*  HCT 37.0 34.8* 32.0*  MCV 89.4 89.2 89.6  PLT 202 195 126*   Basic Metabolic Panel: Recent Labs  Lab 04/21/20 1043 04/22/20 0627 04/24/20 0329  NA 138 132* 133*  K 3.8 3.9 4.1  CL 100 96* 101  CO2 27 26 25   GLUCOSE 196* 203* 216*  BUN 7* 11 10  CREATININE 0.69 0.68 0.53  CALCIUM 9.5 9.3 8.5*   GFR: Estimated Creatinine Clearance: 36.2 mL/min (by C-G formula based on SCr of 0.53 mg/dL). Liver Function Tests: No results for input(s): AST, ALT, ALKPHOS, BILITOT, PROT, ALBUMIN in the last 168 hours. No results for input(s): LIPASE, AMYLASE in the last 168 hours. No results for input(s): AMMONIA in the last 168 hours. Coagulation Profile: Recent Labs  Lab 04/21/20 1521  INR 1.1   Cardiac Enzymes: No results for input(s): CKTOTAL, CKMB, CKMBINDEX, TROPONINI in the last 168 hours. BNP (last 3 results) No results for input(s): PROBNP in the last 8760 hours. HbA1C: Recent Labs    04/21/20 1521  HGBA1C 7.3*    CBG: Recent Labs  Lab 04/23/20 1637 04/23/20 2107 04/24/20 0750 04/24/20 1018 04/24/20 1130  GLUCAP 200* 199* 211* 182* 152*   Lipid Profile: No results for input(s): CHOL, HDL, LDLCALC, TRIG, CHOLHDL, LDLDIRECT in the last 72 hours. Thyroid Function Tests: No results for input(s): TSH, T4TOTAL, FREET4, T3FREE, THYROIDAB in the last 72 hours. Anemia Panel: No results for input(s): VITAMINB12, FOLATE, FERRITIN, TIBC, IRON, RETICCTPCT in the last 72 hours. Sepsis Labs: No results for input(s): PROCALCITON, LATICACIDVEN in the last 168 hours.  Recent Results (from the past 240 hour(s))  Respiratory Panel by RT PCR (Flu A&B, Covid) - Nasopharyngeal Swab     Status: None   Collection Time: 04/21/20 10:43 AM   Specimen: Nasopharyngeal Swab  Result Value Ref Range Status   SARS Coronavirus 2 by RT PCR NEGATIVE NEGATIVE Final    Comment: (NOTE) SARS-CoV-2 target nucleic acids are NOT DETECTED.  The SARS-CoV-2 RNA is generally detectable in upper respiratoy specimens during the acute phase of infection. The lowest concentration of  SARS-CoV-2 viral copies this assay can detect is 131 copies/mL. A negative result does not preclude SARS-Cov-2 infection and should not be used as the sole basis for treatment or other patient management decisions. A negative result may occur with  improper specimen collection/handling, submission of specimen other than nasopharyngeal swab, presence of viral mutation(s) within the areas targeted by this assay, and inadequate number of viral copies (<131 copies/mL). A negative result must be combined with clinical observations, patient history, and epidemiological information. The expected result is Negative.  Fact Sheet for Patients:  https://www.moore.com/  Fact Sheet for Healthcare Providers:  https://www.young.biz/  This test is no t yet approved or cleared by the [place] FDA and  has been authorized  for detection and/or diagnosis of SARS-CoV-2 by FDA under an Emergency Use Authorization (EUA). This EUA will remain  in effect (meaning this test can be used) for the duration of the COVID-19 declaration under Section 564(b)(1) of the Act, 21 U.S.C. section 360bbb-3(b)(1), unless the authorization is terminated or revoked sooner.     Influenza A by PCR NEGATIVE NEGATIVE Final   Influenza B by PCR NEGATIVE NEGATIVE Final    Comment: (NOTE) The Xpert Xpress SARS-CoV-2/FLU/RSV assay is intended as an aid in  the diagnosis of influenza from Nasopharyngeal swab specimens and  should not be used as a sole basis for treatment. Nasal washings and  aspirates are unacceptable for Xpert Xpress SARS-CoV-2/FLU/RSV  testing.  Fact Sheet for Patients: https://www.moore.com/  Fact Sheet for Healthcare Providers: https://www.young.biz/  This test is not yet approved or cleared by the [place] FDA and  has been authorized for detection and/or diagnosis of SARS-CoV-2 by  FDA under an Emergency Use Authorization (EUA). This EUA will remain  in effect (meaning this test can be used) for the duration of the  Covid-19 declaration under Section 564(b)(1) of the Act, 21  U.S.C. section 360bbb-3(b)(1), unless the authorization is  terminated or revoked. Performed at [facility], [address], [place], [place] [address]          Radiology Studies: DG HIP OPERATIVE Lucienne Capers OR W/O PELVIS RIGHT  Result Date: 04/24/2020 CLINICAL DATA:  RIGHT IM nail in OR. EXAM: OPERATIVE RIGHT HIP (WITH PELVIS IF PERFORMED) 2 VIEWS TECHNIQUE: Fluoroscopic spot image(s) were submitted for interpretation post-operatively. COMPARISON:  None. FINDINGS: Intraoperative fluoroscopic spot images of the RIGHT hip are provided showing intramedullary nail within the proximal RIGHT femur with associated dynamic screw traversing the RIGHT femoral neck. Hardware appears intact  and appropriately positioned. Osseous alignment is anatomic. Fluoroscopy was provided for 1 minutes. IMPRESSION: Intraoperative films of the RIGHT hip. No evidence of surgical complicating feature. Electronically Signed   By: Bary Richard M.D.   On: 04/24/2020 10:36   DG FEMUR, MIN 2 VIEWS RIGHT  Result Date: 04/24/2020 CLINICAL DATA:  Status post intramedullary rod fixation of right femoral fracture. EXAM: RIGHT FEMUR 2 VIEWS COMPARISON:  April 21, 2020. FINDINGS: Status post intramedullary rod fixation of proximal right femoral intertrochanteric fracture. Good alignment of fracture components is noted. Expected postoperative changes are seen in the surrounding soft tissues. Vascular calcifications are noted. IMPRESSION: Status post intramedullary rod fixation of proximal right femoral intertrochanteric fracture. Electronically Signed   By: Lupita Raider M.D.   On: 04/24/2020 11:37        Scheduled Meds: . [MAR Hold] amLODipine  5 mg Oral Daily  . [MAR Hold] Chlorhexidine Gluconate Cloth  6 each Topical Daily  . [facility] Hold]  cholecalciferol  1,000 Units Oral QPC supper  . [MAR Hold] feeding supplement (GLUCERNA SHAKE)  237 mL Oral TID BM  . [MAR Hold] gabapentin  300 mg Oral TID  . [MAR Hold] insulin aspart  0-5 Units Subcutaneous QHS  . [MAR Hold] insulin aspart  0-9 Units Subcutaneous TID WC  . [MAR Hold] irbesartan  75 mg Oral Daily  . [MAR Hold] ketorolac  15 mg Intravenous Q6H  . [MAR Hold] latanoprost  1 drop Both Eyes QHS  . [MAR Hold] multivitamin-lutein  1 capsule Oral BID  . [MAR Hold] vitamin B-12  500 mcg Oral Daily   Continuous Infusions: . sodium chloride 75 mL/hr at 04/24/20 0822  . [MAR Hold] cefTRIAXone (ROCEPHIN)  IV 1 g (04/23/20 0929)     LOS: 3 days    Time spent: 25 minutes    Tresa Moore, MD [facility] Pager [phone]  If 7PM-7AM, please contact night-coverage 04/24/2020, 11:53 AM

## 2020-04-24 NOTE — Transfer of Care (Signed)
Immediate Anesthesia Transfer of Care Note  Patient: Suzanne Cantu  Procedure(s) Performed: INTRAMEDULLARY (IM) NAIL INTERTROCHANTRIC (Right Hip)  Patient Location: PACU  Anesthesia Type:Spinal  Level of Consciousness: drowsy  Airway & Oxygen Therapy: Patient Spontanous Breathing and Patient connected to face mask oxygen  Post-op Assessment: Report given to RN and Post -op Vital signs reviewed and stable  Post vital signs: Reviewed and stable  Last Vitals:  Vitals Value Taken Time  BP 100/52 04/24/20 1015  Temp 37.4 C 04/24/20 1015  Pulse 100 04/24/20 1019  Resp 21 04/24/20 1019  SpO2 100 % 04/24/20 1019  Vitals shown include unvalidated device data.  Last Pain:  Vitals:   04/24/20 1015  TempSrc:   PainSc: Asleep         Complications: No complications documented.

## 2020-04-25 DIAGNOSIS — I482 Chronic atrial fibrillation, unspecified: Secondary | ICD-10-CM | POA: Diagnosis not present

## 2020-04-25 DIAGNOSIS — E119 Type 2 diabetes mellitus without complications: Secondary | ICD-10-CM | POA: Diagnosis not present

## 2020-04-25 DIAGNOSIS — S72001A Fracture of unspecified part of neck of right femur, initial encounter for closed fracture: Secondary | ICD-10-CM | POA: Diagnosis not present

## 2020-04-25 DIAGNOSIS — I10 Essential (primary) hypertension: Secondary | ICD-10-CM | POA: Diagnosis not present

## 2020-04-25 LAB — CBC WITH DIFFERENTIAL/PLATELET
Abs Immature Granulocytes: 0.03 10*3/uL (ref 0.00–0.07)
Basophils Absolute: 0 10*3/uL (ref 0.0–0.1)
Basophils Relative: 0 %
Eosinophils Absolute: 0.1 10*3/uL (ref 0.0–0.5)
Eosinophils Relative: 1 %
HCT: 28.3 % — ABNORMAL LOW (ref 36.0–46.0)
Hemoglobin: 9.8 g/dL — ABNORMAL LOW (ref 12.0–15.0)
Immature Granulocytes: 0 %
Lymphocytes Relative: 11 %
Lymphs Abs: 1 10*3/uL (ref 0.7–4.0)
MCH: 31.1 pg (ref 26.0–34.0)
MCHC: 34.6 g/dL (ref 30.0–36.0)
MCV: 89.8 fL (ref 80.0–100.0)
Monocytes Absolute: 1 10*3/uL (ref 0.1–1.0)
Monocytes Relative: 12 %
Neutro Abs: 6.5 10*3/uL (ref 1.7–7.7)
Neutrophils Relative %: 76 %
Platelets: 160 10*3/uL (ref 150–400)
RBC: 3.15 MIL/uL — ABNORMAL LOW (ref 3.87–5.11)
RDW: 13.2 % (ref 11.5–15.5)
WBC: 8.6 10*3/uL (ref 4.0–10.5)
nRBC: 0 % (ref 0.0–0.2)

## 2020-04-25 LAB — GLUCOSE, CAPILLARY
Glucose-Capillary: 211 mg/dL — ABNORMAL HIGH (ref 70–99)
Glucose-Capillary: 239 mg/dL — ABNORMAL HIGH (ref 70–99)
Glucose-Capillary: 253 mg/dL — ABNORMAL HIGH (ref 70–99)
Glucose-Capillary: 173 mg/dL — ABNORMAL HIGH (ref 70–99)

## 2020-04-25 LAB — BASIC METABOLIC PANEL
Anion gap: 7 (ref 5–15)
BUN: 16 mg/dL (ref 8–23)
CO2: 24 mmol/L (ref 22–32)
Calcium: 8.6 mg/dL — ABNORMAL LOW (ref 8.9–10.3)
Chloride: 105 mmol/L (ref 98–111)
Creatinine, Ser: 0.67 mg/dL (ref 0.44–1.00)
GFR, Estimated: >60 mL/min (ref >60)
Glucose, Bld: 230 mg/dL — ABNORMAL HIGH (ref 70–99)
Potassium: 4.3 mmol/L (ref 3.5–5.1)
Sodium: 136 mmol/L (ref 135–145)

## 2020-04-25 LAB — URINE CULTURE: Culture: >=100000 — AB

## 2020-04-25 MED ORDER — AMPICILLIN 500 MG PO CAPS
500.0000 mg | ORAL_CAPSULE | Freq: Four times a day (QID) | ORAL | Status: AC
  Administered 2020-04-25 – 2020-04-30 (×20): 500 mg via ORAL
  Filled 2020-04-25 (×20): qty 1

## 2020-04-25 MED ORDER — TAMSULOSIN HCL 0.4 MG PO CAPS
0.4000 mg | ORAL_CAPSULE | Freq: Every day | ORAL | Status: DC
  Administered 2020-04-25 – 2020-05-01 (×7): 0.4 mg via ORAL
  Filled 2020-04-25 (×7): qty 1

## 2020-04-25 MED ORDER — KETOROLAC TROMETHAMINE 15 MG/ML IJ SOLN
15.0000 mg | Freq: Four times a day (QID) | INTRAMUSCULAR | Status: DC
  Administered 2020-04-26: 15 mg via INTRAVENOUS
  Filled 2020-04-25 (×3): qty 1

## 2020-04-25 NOTE — Evaluation (Signed)
Physical Therapy Evaluation Patient Details Name: Suzanne Cantu MRN: [id number] DOB: 01/21/1927 Today's Date: 04/25/2020   History of Present Illness  86 year old female history of A. fib on Eliquis, CVA, seizure disorder who presents with a mechanical fall and right hip pain.  No loss of consciousness.  On admission x-ray shows severely comminuted and displaced intertrochanteric fracture right proximal femur. S/p IM nail of R hip on 04/24/20  Clinical Impression  Pt was seen for mobility on side of bed, attempted to stand but withheld due to sats being 86% with side of bed sitting, pulse 118.  Pt is able to help with sit balance, and has better control with dense verbal and tactile cues to correct sit posture.  Due to her drop in sats, PT placed 1L O2 on cannula on pt, let nursing know.  Even with 1L was 92% at rest in bed.  Follow up with her to attempt more standing and balancing, and continue on with LE strengthening as tolerated.  Pt's son is supportive and in communication with her daughters regarding her care.  Recommending SNF for follow up rehab.    Follow Up Recommendations SNF    Equipment Recommendations  None recommended by PT    Recommendations for Other Services OT consult     Precautions / Restrictions Precautions Precautions: Fall Restrictions Weight Bearing Restrictions: Yes RLE Weight Bearing: Weight bearing as tolerated      Mobility  Bed Mobility Overal bed mobility: Needs Assistance Bed Mobility: Supine to Sit;Sit to Supine     Supine to sit: Max assist Sit to supine: Max assist   General bed mobility comments: max using bed pad and max of 2 for scooting up in bed    Transfers Overall transfer level: Needs assistance               General transfer comment: unable to attempt due to O2 sats dropping in sitting  Ambulation/Gait             General Gait Details: unable  Stairs            Wheelchair Mobility    Modified  Rankin (Stroke Patients Only)       Balance Overall balance assessment: Needs assistance Sitting-balance support: Feet supported Sitting balance-Leahy Scale: Poor Sitting balance - Comments: requires cues to correct, no protective ext                                     Pertinent Vitals/Pain Pain Assessment: Faces Faces Pain Scale: Hurts even more Pain Location: R hip Pain Descriptors / Indicators: Guarding;Operative site guarding Pain Intervention(s): Monitored during session;Repositioned;Premedicated before session    Home Living Family/patient expects to be discharged to:: Private residence Living Arrangements: Alone Available Help at Discharge: Family;Personal care attendant;Available 24 hours/day Type of Home: House Home Access: Ramped entrance     Home Layout: One level Home Equipment: Walker - 2 wheels;Shower seat;Grab bars - toilet;Grab bars - tub/shower;Hand held shower head;Transport chair Additional Comments: Grab bars on walls in bathroom     Prior Function Level of Independence: Independent with assistive device(s)         Comments: RW for mobility and ADLs. assist for bathing and IADLs     Hand Dominance   Dominant Hand: Right    Extremity/Trunk Assessment   Upper Extremity Assessment Upper Extremity Assessment: Defer to OT evaluation    Lower Extremity Assessment  Lower Extremity Assessment: RLE deficits/detail RLE Deficits / Details: RLE unable to move without PT giving tactile and verbal cues RLE: Unable to fully assess due to pain RLE Coordination: decreased gross motor    Cervical / Trunk Assessment Cervical / Trunk Assessment: Kyphotic  Communication   Communication: Expressive difficulties  Cognition Arousal/Alertness: Awake/alert;Lethargic Behavior During Therapy: Flat affect Overall Cognitive Status: Impaired/Different from baseline Area of Impairment: Attention;Memory;Following commands;Safety/judgement;Problem  solving                   Current Attention Level: Selective Memory: Decreased short-term memory;Decreased recall of precautions Following Commands: Follows one step commands inconsistently;Follows one step commands with increased time Safety/Judgement: Decreased awareness of deficits;Decreased awareness of safety   Problem Solving: Slow processing;Requires verbal cues;Requires tactile cues General Comments: son states meds have made pt more lethargic, not previous level of alertness      General Comments      Exercises Other Exercises Other Exercises: Pt and son educated re: OT role, DME recs, d/c recs, falls prevention, ECS, HEP, importance of mobility for pain mgmt Other Exercises: LBD, self-feeding, sup<>sit, sitting balance/tolerance   Assessment/Plan    PT Assessment    PT Problem List         PT Treatment Interventions      PT Goals (Current goals can be found in the Care Plan section)  Acute Rehab PT Goals Patient Stated Goal: feel better PT Goal Formulation: With patient/family Time For Goal Achievement: 05/09/20 Potential to Achieve Goals: Good    Frequency 7X/week   Barriers to discharge        Co-evaluation               AM-PAC PT "6 Clicks" Mobility  Outcome Measure Help needed turning from your back to your side while in a flat bed without using bedrails?: A Lot Help needed moving from lying on your back to sitting on the side of a flat bed without using bedrails?: A Lot Help needed moving to and from a bed to a chair (including a wheelchair)?: A Lot Help needed standing up from a chair using your arms (e.g., wheelchair or bedside chair)?: Total Help needed to walk in hospital room?: Total Help needed climbing 3-5 steps with a railing? : Total 6 Click Score: 9    End of Session Equipment Utilized During Treatment: Gait belt;Oxygen Activity Tolerance: Patient limited by fatigue;Treatment limited secondary to medical complications  (Comment) Patient left: in bed;with call bell/phone within reach;with bed alarm set;with family/visitor present Nurse Communication: Mobility status PT Visit Diagnosis: Muscle weakness (generalized) (M62.81);Other abnormalities of gait and mobility (R26.89);Difficulty in walking, not elsewhere classified (R26.2);Pain Pain - Right/Left: Right Pain - part of body: Hip    Time: 7096-2836 PT Time Calculation (min) (ACUTE ONLY): 24 min   Charges:   PT Evaluation $PT Eval Moderate Complexity: 1 Mod PT Treatments $Therapeutic Activity: 8-22 mins       Ivar Drape 04/25/2020, 12:51 PM  Samul Dada, PT MS Acute Rehab Dept. Number: [facility] [id number] and [facility] [phone]

## 2020-04-25 NOTE — Plan of Care (Signed)
  Problem: Health Behavior/Discharge Planning: Goal: Ability to manage health-related needs will improve Outcome: Progressing   Problem: Clinical Measurements: Goal: Will remain free from infection Outcome: Progressing Goal: Diagnostic test results will improve Outcome: Progressing Goal: Cardiovascular complication will be avoided Outcome: Progressing   

## 2020-04-25 NOTE — Progress Notes (Signed)
PROGRESS NOTE    Suzanne Cantu  WUJ:[id number] DOB: 1927-04-03 DOA: 04/21/2020 PCP: Corky DownsMasoud, Javed, MD   Brief Narrative:  [age] year old female history of A. fib on Eliquis, CVA, seizure disorder who presents with a mechanical fall and right hip pain.  No loss of consciousness.  On admission x-ray shows severely comminuted and displaced intertrochanteric fracture right proximal femur.  Orthopedics consulted from admission.  Plan for operative fixation during this admission however patient is on Eliquis and we need to wait 3 days prior to administering spinal anesthesia.  Per orthopedics surgery scheduled for Saturday, 04/24/2020  Status post OR with orthopedics on 04/24/2020.  Has postoperative pain today.  Assessment & Plan:   Principal Problem:   Closed right hip fracture (HCC) Active Problems:   Essential hypertension   Controlled type 2 diabetes mellitus without complication, without long-term current use of insulin (HCC)   History of CVA (cerebrovascular accident)   Fall   Atrial fibrillation, chronic (HCC)   Protein-calorie malnutrition, severe  Closed right hip fracture (HCC) Severe community fracture seen on x-ray.  Severe pain on admission.  Now on multimodal regimen with better control. Orthopedic surgeon, Dr. Martha ClanKrasinski was consulted.  Status post intramedullary fixation of right intertrochanteric hip fracture on 04/24/2020 Postop day 1.  Has some postoperative pain Plan: Multimodal pain control Continue intravenous fluids until tolerating p.o. As needed antiemetics Apixaban restarted Bowel regimen  Essential hypertension: -IV hydralazine as needed -Irbesartan -Continue amlodipine 5 mg daily for better control -Ensure pain control  Controlled type 2 diabetes mellitus without complication, without long-term current use of insulin (HCC)  Recent A1c 6.5, well controlled.   Patient taking Metformin at home -Sliding scale insulin -Hold Metformin  History of  CVA (cerebrovascular accident) -Eliquis restarted  Fall:  -PT/OT starting postoperative day #1  Chronic A fib: -Eliquis restarted -As needed metoprolol for sustained tachycardia   DVT prophylaxis: SCD Code Status: DNR Family Communication: Son at bedside 04/25/2020 Disposition Plan: Status is: Inpatient  Remains inpatient appropriate because:Inpatient level of care appropriate due to severity of illness   Dispo: The patient is from: Home              Anticipated d/c is to: SNF              Anticipated d/c date is: 2 days              Patient currently is not medically stable to d/c.  Postop day 1 status post intramedullary nail.  Uncomplicated postoperative course.  Does have some expected postoperative pain.  Therapy evaluations.  Anticipate skilled nursing facility at time of discharge.     Consultants:   Orthopedics  Procedures:   None  Antimicrobials:   None   Subjective: Seen and examined.  Postoperative day 1.  Endorses pain in the right hip consistent with postoperative pain.  Objective: Vitals:   04/24/20 2025 04/25/20 0029 04/25/20 0410 04/25/20 0743  BP: 114/69 128/65 (!) 144/70 (!) 151/69  Pulse: (!) 117 (!) 108 (!) 104 (!) 105  Resp: 16 14 16 16   Temp: 99.5 F (37.5 C) 98.7 F (37.1 C) 99.2 F (37.3 C) 98.6 F (37 C)  TempSrc: Oral Oral Oral Axillary  SpO2: 94% 93% 95% 91%  Weight:      Height:        Intake/Output Summary (Last 24 hours) at 04/25/2020 1035 Last data filed at 04/24/2020 1423 Gross per 24 hour  Intake 225 ml  Output 0 ml  Net  225 ml   Filed Weights   04/21/20 0919  Weight: 50.8 kg    Examination:  General exam: No acute distress.  Appears frail Respiratory system: Normal work of breathing.  Clear to auscultation.  Room air Cardiovascular system: Regular rate, irregular rhythm, no murmurs Gastrointestinal system: Scaphoid, nontender, nondistended, normal bowel sounds  Central nervous system: Alert, oriented  to person and place.  No focal deficits  extremities: Right lower extremity in surgical wraps.  Not removed.  Extremity warm.  Good pedal pulses. skin: No rashes, lesions or ulcers Psychiatry: Judgement and insight appear normal. Mood & affect appropriate.     Data Reviewed: I have personally reviewed following labs and imaging studies  CBC: Recent Labs  Lab 04/21/20 1043 04/22/20 0627 04/24/20 0329 04/25/20 0436  WBC 9.6 13.4* 8.5 8.6  NEUTROABS 7.9*  --  6.5 6.5  HGB 12.5 12.0 11.0* 9.8*  HCT 37.0 34.8* 32.0* 28.3*  MCV 89.4 89.2 89.6 89.8  PLT 202 195 126* 160   Basic Metabolic Panel: Recent Labs  Lab 04/21/20 1043 04/22/20 0627 04/24/20 0329 04/25/20 0436  NA 138 132* 133* 136  K 3.8 3.9 4.1 4.3  CL 100 96* 101 105  CO2 27 26 25 24   GLUCOSE 196* 203* 216* 230*  BUN 7* 11 10 16   CREATININE 0.69 0.68 0.53 0.67  CALCIUM 9.5 9.3 8.5* 8.6*   GFR: Estimated Creatinine Clearance: 36.2 mL/min (by C-G formula based on SCr of 0.67 mg/dL). Liver Function Tests: No results for input(s): AST, ALT, ALKPHOS, BILITOT, PROT, ALBUMIN in the last 168 hours. No results for input(s): LIPASE, AMYLASE in the last 168 hours. No results for input(s): AMMONIA in the last 168 hours. Coagulation Profile: Recent Labs  Lab 04/21/20 1521  INR 1.1   Cardiac Enzymes: No results for input(s): CKTOTAL, CKMB, CKMBINDEX, TROPONINI in the last 168 hours. BNP (last 3 results) No results for input(s): PROBNP in the last 8760 hours. HbA1C: No results for input(s): HGBA1C in the last 72 hours. CBG: Recent Labs  Lab 04/24/20 1018 04/24/20 1130 04/24/20 1632 04/24/20 2050 04/25/20 0743  GLUCAP 182* 152* 199* 155* 211*   Lipid Profile: No results for input(s): CHOL, HDL, LDLCALC, TRIG, CHOLHDL, LDLDIRECT in the last 72 hours. Thyroid Function Tests: No results for input(s): TSH, T4TOTAL, FREET4, T3FREE, THYROIDAB in the last 72 hours. Anemia Panel: No results for input(s): VITAMINB12,  FOLATE, FERRITIN, TIBC, IRON, RETICCTPCT in the last 72 hours. Sepsis Labs: No results for input(s): PROCALCITON, LATICACIDVEN in the last 168 hours.  Recent Results (from the past 240 hour(s))  Respiratory Panel by RT PCR (Flu A&B, Covid) - Nasopharyngeal Swab     Status: None   Collection Time: 04/21/20 10:43 AM   Specimen: Nasopharyngeal Swab  Result Value Ref Range Status   SARS Coronavirus 2 by RT PCR NEGATIVE NEGATIVE Final    Comment: (NOTE) SARS-CoV-2 target nucleic acids are NOT DETECTED.  The SARS-CoV-2 RNA is generally detectable in upper respiratoy specimens during the acute phase of infection. The lowest concentration of SARS-CoV-2 viral copies this assay can detect is 131 copies/mL. A negative result does not preclude SARS-Cov-2 infection and should not be used as the sole basis for treatment or other patient management decisions. A negative result may occur with  improper specimen collection/handling, submission of specimen other than nasopharyngeal swab, presence of viral mutation(s) within the areas targeted by this assay, and inadequate number of viral copies (<131 copies/mL). A negative result must be combined with  clinical observations, patient history, and epidemiological information. The expected result is Negative.  Fact Sheet for Patients:  https://www.moore.com/  Fact Sheet for Healthcare Providers:  https://www.young.biz/  This test is no t yet approved or cleared by the [place] FDA and  has been authorized for detection and/or diagnosis of SARS-CoV-2 by FDA under an Emergency Use Authorization (EUA). This EUA will remain  in effect (meaning this test can be used) for the duration of the COVID-19 declaration under Section 564(b)(1) of the Act, 21 U.S.C. section 360bbb-3(b)(1), unless the authorization is terminated or revoked sooner.     Influenza A by PCR NEGATIVE NEGATIVE Final   Influenza B by PCR  NEGATIVE NEGATIVE Final    Comment: (NOTE) The Xpert Xpress SARS-CoV-2/FLU/RSV assay is intended as an aid in  the diagnosis of influenza from Nasopharyngeal swab specimens and  should not be used as a sole basis for treatment. Nasal washings and  aspirates are unacceptable for Xpert Xpress SARS-CoV-2/FLU/RSV  testing.  Fact Sheet for Patients: https://www.moore.com/  Fact Sheet for Healthcare Providers: https://www.young.biz/  This test is not yet approved or cleared by the [place] FDA and  has been authorized for detection and/or diagnosis of SARS-CoV-2 by  FDA under an Emergency Use Authorization (EUA). This EUA will remain  in effect (meaning this test can be used) for the duration of the  Covid-19 declaration under Section 564(b)(1) of the Act, 21  U.S.C. section 360bbb-3(b)(1), unless the authorization is  terminated or revoked. Performed at [facility], [address] [place], [place] [address]   Culture, Urine     Status: Abnormal   Collection Time: 04/23/20 10:56 AM   Specimen: Urine, Random  Result Value Ref Range Status   Specimen Description   Final    URINE, RANDOM Performed at [facility], [address] [place], [place] [address]    Special Requests   Final    NONE Performed at [facility], [address] [place], [place] [address]    Culture >=100,000 COLONIES/mL ENTEROCOCCUS FAECALIS (A)  Final   Report Status 04/25/2020 FINAL  Final   Organism ID, Bacteria ENTEROCOCCUS FAECALIS (A)  Final      Susceptibility   Enterococcus faecalis - MIC*    AMPICILLIN <=2 SENSITIVE Sensitive     NITROFURANTOIN <=16 SENSITIVE Sensitive     VANCOMYCIN 2 SENSITIVE Sensitive     * >=100,000 COLONIES/mL ENTEROCOCCUS FAECALIS         Radiology Studies: DG HIP OPERATIVE UNILAT W OR W/O PELVIS RIGHT  Result Date: 04/24/2020 CLINICAL DATA:  RIGHT IM nail in OR. EXAM: OPERATIVE RIGHT HIP  (WITH PELVIS IF PERFORMED) 2 VIEWS TECHNIQUE: Fluoroscopic spot image(s) were submitted for interpretation post-operatively. COMPARISON:  None. FINDINGS: Intraoperative fluoroscopic spot images of the RIGHT hip are provided showing intramedullary nail within the proximal RIGHT femur with associated dynamic screw traversing the RIGHT femoral neck. Hardware appears intact and appropriately positioned. Osseous alignment is anatomic. Fluoroscopy was provided for 1 minutes. IMPRESSION: Intraoperative films of the RIGHT hip. No evidence of surgical complicating feature. Electronically Signed   By: Bary Richard M.D.   On: 04/24/2020 10:36   DG FEMUR, MIN 2 VIEWS RIGHT  Result Date: 04/24/2020 CLINICAL DATA:  Status post intramedullary rod fixation of right femoral fracture. EXAM: RIGHT FEMUR 2 VIEWS COMPARISON:  April 21, 2020. FINDINGS: Status post intramedullary rod fixation of proximal right femoral intertrochanteric fracture. Good alignment of fracture components is noted. Expected postoperative changes are  seen in the surrounding soft tissues. Vascular calcifications are noted. IMPRESSION: Status post intramedullary rod fixation of proximal right femoral intertrochanteric fracture. Electronically Signed   By: Lupita Raider M.D.   On: 04/24/2020 11:37        Scheduled Meds: . amLODipine  5 mg Oral Daily  . ampicillin  500 mg Oral Q6H  . apixaban  2.5 mg Oral BID  . Chlorhexidine Gluconate Cloth  6 each Topical Daily  . cholecalciferol  1,000 Units Oral QPC supper  . docusate sodium  100 mg Oral BID  . feeding supplement (GLUCERNA SHAKE)  237 mL Oral TID BM  . gabapentin  300 mg Oral TID  . insulin aspart  0-5 Units Subcutaneous QHS  . insulin aspart  0-9 Units Subcutaneous TID WC  . irbesartan  75 mg Oral Daily  . ketorolac  15 mg Intravenous Q6H  . latanoprost  1 drop Both Eyes QHS  . multivitamin-lutein  1 capsule Oral BID  . senna  1 tablet Oral BID  . traMADol  50 mg Oral Q6H  .  vitamin B-12  500 mcg Oral Daily   Continuous Infusions: . sodium chloride 75 mL/hr at 04/24/20 0822  . sodium chloride 75 mL/hr (04/25/20 0316)  . methocarbamol (ROBAXIN) IV       LOS: 4 days    Time spent: 25 minutes    Tresa Moore, MD [facility] Pager [phone]  If 7PM-7AM, please contact night-coverage 04/25/2020, 10:35 AM

## 2020-04-25 NOTE — Anesthesia Postprocedure Evaluation (Signed)
Anesthesia Post Note  Patient: Suzanne Cantu  Procedure(s) Performed: INTRAMEDULLARY (IM) NAIL INTERTROCHANTRIC (Right Hip)  Patient location during evaluation: Other Anesthesia Type: Spinal Level of consciousness: awake and alert and oriented Pain management: pain level controlled Vital Signs Assessment: post-procedure vital signs reviewed and stable Respiratory status: spontaneous breathing Cardiovascular status: blood pressure returned to baseline Anesthetic complications: no   No complications documented.   Last Vitals:  Vitals:   04/25/20 1110 04/25/20 1609  BP: (!) 155/55 (!) 142/60  Pulse:  (!) 109  Resp:  16  Temp:  37.3 C  SpO2:  90%    Last Pain:  Vitals:   04/25/20 1609  TempSrc: Axillary  PainSc:                  Mordecai Tindol

## 2020-04-25 NOTE — Progress Notes (Signed)
Subjective: 1 Day Post-Op Procedure(s) (LRB): INTRAMEDULLARY (IM) NAIL INTERTROCHANTRIC (Right)   Patient is awake and moderately alert.  Her son says she sat up at side of the bed.  Pain is controlled.  Hemoglobin is 9.0.  Dressings are dry.  Patient reports pain as mild.  Objective:   VITALS:   Vitals:   04/25/20 0743 04/25/20 1110  BP: (!) 151/69 (!) 155/55  Pulse: (!) 105   Resp: 16   Temp: 98.6 F (37 C)   SpO2: 91%     Neurologically intact Neurovascular intact Sensation intact distally Dorsiflexion/Plantar flexion intact Incision: dressing C/D/I  LABS Recent Labs    04/24/20 0329 04/25/20 0436  HGB 11.0* 9.8*  HCT 32.0* 28.3*  WBC 8.5 8.6  PLT 126* 160    Recent Labs    04/24/20 0329 04/25/20 0436  NA 133* 136  K 4.1 4.3  BUN 10 16  CREATININE 0.53 0.67  GLUCOSE 216* 230*    No results for input(s): LABPT, INR in the last 72 hours.   Assessment/Plan: 1 Day Post-Op Procedure(s) (LRB): INTRAMEDULLARY (IM) NAIL INTERTROCHANTRIC (Right)   Advance diet Up with therapy Discharge to SNF   Resume Eliquis tomorrow.  Partial weightbearing right leg only.

## 2020-04-25 NOTE — Evaluation (Signed)
Occupational Therapy Evaluation Patient Details Name: Suzanne Cantu MRN: [id number] DOB: [birth year] Today's Date: 04/25/2020    History of Present Illness 86 year old female history of A. fib on Eliquis, CVA, seizure disorder who presents with a mechanical fall and right hip pain.  No loss of consciousness.  On admission x-ray shows severely comminuted and displaced intertrochanteric fracture right proximal femur. S/p IM nail of R hip on 04/24/20   Clinical Impression   [name] was seen for OT evaluation this date, POD#1 from above surgery. Prior to hospital admission, pt was MOD I for mobility and ADLs, requiring assist for bathing and IADLs. Pt lives alone c family and PCA providing 24/7 assistance. Pt presents to acute OT demonstrating impaired ADL performance and functional mobility 2/2 decreased safety awareness, functional strength/balance/ROM deficits, decreased activity tolerance, and pain. Pt currently requires MAX A for bed mobility. Initial MIN A for sitting balance improving to CGA + single UE for self-drinking seated EOB - intermittient multimodal cues for sequencing. MAX A for LBD at bed level. Deferred OOB 2/2 somnolence and poor sitting balance. Pt would benefit from skilled OT to address noted impairments and functional limitations (see below for any additional details) in order to maximize safety and independence while minimizing falls risk and caregiver burden. Upon hospital discharge, recommend STR to maximize pt safety and return to PLOF.     Follow Up Recommendations  SNF    Equipment Recommendations  Other (comment) (TBD)    Recommendations for Other Services       Precautions / Restrictions Precautions Precautions: Fall Restrictions Weight Bearing Restrictions: Yes RLE Weight Bearing: Weight bearing as tolerated      Mobility Bed Mobility Overal bed mobility: Needs Assistance Bed Mobility: Supine to Sit;Sit to Supine     Supine to sit: Max assist;HOB  elevated Sit to supine: Max assist     Transfers     General transfer comment: Deferred 2/2 pt somnolence and poor sitting balance EOB     Balance Overall balance assessment: Needs assistance Sitting-balance support: Bilateral upper extremity supported;Feet supported Sitting balance-Leahy Scale: Fair        ADL either performed or assessed with clinical judgement   ADL Overall ADL's : Needs assistance/impaired    General ADL Comments: CGA + single UE support self-drinking seated EOB - intermittient multimodal cues for sequencing. MAX A for LBD at bed level                   Pertinent Vitals/Pain Pain Assessment: Faces Faces Pain Scale: Hurts even more Pain Location: R hip Pain Descriptors / Indicators: Discomfort;Grimacing;Dull Pain Intervention(s): Limited activity within patient's tolerance;Premedicated before session;Repositioned     Hand Dominance Right   Extremity/Trunk Assessment Upper Extremity Assessment Upper Extremity Assessment: Generalized weakness   Lower Extremity Assessment Lower Extremity Assessment: RLE deficits/detail RLE: Unable to fully assess due to pain       Communication Communication Communication: Expressive difficulties   Cognition Arousal/Alertness: Awake/alert Behavior During Therapy: WFL for tasks assessed/performed Overall Cognitive Status: Impaired/Different from baseline Area of Impairment: Attention;Memory;Following commands;Safety/judgement;Problem solving          Memory: Decreased short-term memory Following Commands: Follows one step commands inconsistently;Follows one step commands with increased time Safety/Judgement: Decreased awareness of safety;Decreased awareness of deficits   Problem Solving: Slow processing;Requires verbal cues;Requires tactile cues;Decreased initiation General Comments: Son at bedside states concern that medication is too sedative - RN aware   General Comments       Exercises  Exercises: Other exercises Other Exercises Other Exercises: Pt and son educated re: OT role, DME recs, d/c recs, falls prevention, ECS, HEP, importance of mobility for pain mgmt Other Exercises: LBD, self-feeding, sup<>sit, sitting balance/tolerance   Shoulder Instructions      Home Living Family/patient expects to be discharged to:: Private residence Living Arrangements: Alone Available Help at Discharge: Family;Personal care attendant;Available 24 hours/day Type of Home: House Home Access: Ramped entrance     Home Layout: One level     Bathroom Shower/Tub: Producer, television/film/video: Standard Bathroom Accessibility: No   Home Equipment: Environmental consultant - 2 wheels;Shower seat;Grab bars - toilet;Grab bars - tub/shower;Hand held shower head;Transport chair   Additional Comments: Grab bars on walls in bathroom       Prior Functioning/Environment Level of Independence: Independent with assistive device(s)        Comments: RW for mobility and ADLs. assist for bathing and IADLs        OT Problem List: Decreased strength;Decreased range of motion;Decreased activity tolerance;Impaired balance (sitting and/or standing);Decreased cognition;Decreased safety awareness      OT Treatment/Interventions: Self-care/ADL training;Therapeutic exercise;Energy conservation;DME and/or AE instruction;Therapeutic activities;Patient/family education;Balance training    OT Goals(Current goals can be found in the care plan section) Acute Rehab OT Goals Patient Stated Goal: To improve pain  OT Goal Formulation: With patient/family Time For Goal Achievement: 05/09/20 Potential to Achieve Goals: Good ADL Goals Pt Will Perform Grooming: with min guard assist;sitting Pt Will Perform Lower Body Dressing: with max assist;sitting/lateral leans Pt Will Transfer to Toilet: with max assist;squat pivot transfer;bedside commode (c LRAD PRN)  OT Frequency: Min 1X/week   Barriers to D/C: Inaccessible home  environment             AM-PAC OT "6 Clicks" Daily Activity     Outcome Measure Help from another person eating meals?: A Little Help from another person taking care of personal grooming?: A Little Help from another person toileting, which includes using toliet, bedpan, or urinal?: A Lot Help from another person bathing (including washing, rinsing, drying)?: A Lot Help from another person to put on and taking off regular upper body clothing?: A Little Help from another person to put on and taking off regular lower body clothing?: A Lot 6 Click Score: 15   End of Session    Activity Tolerance: Patient limited by fatigue;Patient tolerated treatment well Patient left: in bed;with call bell/phone within reach;with family/visitor present  OT Visit Diagnosis: Other abnormalities of gait and mobility (R26.89);Muscle weakness (generalized) (M62.81)                Time: 2633-3545 OT Time Calculation (min): 30 min Charges:  OT General Charges $OT Visit: 1 Visit OT Evaluation $OT Eval Moderate Complexity: 1 Mod OT Treatments $Self Care/Home Management : 8-22 mins $Therapeutic Activity: 8-22 mins  Kathie Dike, M.S. OTR/L  04/25/20, 10:50 AM  ascom [phone]

## 2020-04-25 NOTE — Progress Notes (Addendum)
Pt more awake. She states she feels better. Foley catheter removed per protocol and pt tolerated well. Pt explained procedure and to call if need to use the restroom. Due to void by 1215pm.

## 2020-04-25 NOTE — Plan of Care (Addendum)
Pt awakens to voice and answers orientation questions appropriately, then falls back to sleep. She is able to take her pills crushed or whole mixed in applesauce depending on the size of the pills. Pt HR in the 100's. Scheduled Tramadol administered per MD orders. Pt remains on RA. Incentive spirometer at bedside. RLE assessment performed and no abnormalities noted. Foley in place with adequate amount of clear amber urine noted. IVF NS infusing at 55ml/hr. On tele ST to Afib, or Afib back to ST. Prn Metropolol available for HR > 130. Family members (son and daughters) involved in her care and visit often. Safety measures in place. Will continue to monitor.  Problem: Education: Goal: Knowledge of General Education information will improve Description: Including pain rating scale, medication(s)/side effects and non-pharmacologic comfort measures Outcome: Progressing   Problem: Health Behavior/Discharge Planning: Goal: Ability to manage health-related needs will improve Outcome: Progressing   Problem: Clinical Measurements: Goal: Ability to maintain clinical measurements within normal limits will improve Outcome: Progressing Goal: Will remain free from infection Outcome: Progressing Goal: Diagnostic test results will improve Outcome: Progressing Goal: Respiratory complications will improve Outcome: Progressing Goal: Cardiovascular complication will be avoided Outcome: Progressing   Problem: Activity: Goal: Risk for activity intolerance will decrease Outcome: Progressing   Problem: Nutrition: Goal: Adequate nutrition will be maintained Outcome: Progressing   Problem: Coping: Goal: Level of anxiety will decrease Outcome: Progressing   Problem: Elimination: Goal: Will not experience complications related to bowel motility Outcome: Progressing Goal: Will not experience complications related to urinary retention Outcome: Progressing   Problem: Pain Managment: Goal: General  experience of comfort will improve Outcome: Progressing   Problem: Safety: Goal: Ability to remain free from injury will improve Outcome: Progressing   Problem: Skin Integrity: Goal: Risk for impaired skin integrity will decrease Outcome: Progressing

## 2020-04-26 ENCOUNTER — Encounter: Payer: Self-pay | Admitting: Orthopedic Surgery

## 2020-04-26 DIAGNOSIS — I482 Chronic atrial fibrillation, unspecified: Secondary | ICD-10-CM | POA: Diagnosis not present

## 2020-04-26 DIAGNOSIS — S72001A Fracture of unspecified part of neck of right femur, initial encounter for closed fracture: Secondary | ICD-10-CM | POA: Diagnosis not present

## 2020-04-26 DIAGNOSIS — E119 Type 2 diabetes mellitus without complications: Secondary | ICD-10-CM | POA: Diagnosis not present

## 2020-04-26 DIAGNOSIS — I10 Essential (primary) hypertension: Secondary | ICD-10-CM | POA: Diagnosis not present

## 2020-04-26 LAB — CBC WITH DIFFERENTIAL/PLATELET
Abs Immature Granulocytes: 0.02 10*3/uL (ref 0.00–0.07)
Basophils Absolute: 0 10*3/uL (ref 0.0–0.1)
Basophils Relative: 0 %
Eosinophils Absolute: 0.1 10*3/uL (ref 0.0–0.5)
Eosinophils Relative: 1 %
HCT: 24.9 % — ABNORMAL LOW (ref 36.0–46.0)
Hemoglobin: 8.4 g/dL — ABNORMAL LOW (ref 12.0–15.0)
Immature Granulocytes: 0 %
Lymphocytes Relative: 12 %
Lymphs Abs: 0.9 10*3/uL (ref 0.7–4.0)
MCH: 31 pg (ref 26.0–34.0)
MCHC: 33.7 g/dL (ref 30.0–36.0)
MCV: 91.9 fL (ref 80.0–100.0)
Monocytes Absolute: 0.8 10*3/uL (ref 0.1–1.0)
Monocytes Relative: 11 %
Neutro Abs: 5.6 10*3/uL (ref 1.7–7.7)
Neutrophils Relative %: 76 %
Platelets: 124 10*3/uL — ABNORMAL LOW (ref 150–400)
RBC: 2.71 MIL/uL — ABNORMAL LOW (ref 3.87–5.11)
RDW: 13.2 % (ref 11.5–15.5)
WBC: 7.5 10*3/uL (ref 4.0–10.5)
nRBC: 0 % (ref 0.0–0.2)

## 2020-04-26 LAB — GLUCOSE, CAPILLARY
Glucose-Capillary: 192 mg/dL — ABNORMAL HIGH (ref 70–99)
Glucose-Capillary: 202 mg/dL — ABNORMAL HIGH (ref 70–99)
Glucose-Capillary: 225 mg/dL — ABNORMAL HIGH (ref 70–99)
Glucose-Capillary: 225 mg/dL — ABNORMAL HIGH (ref 70–99)

## 2020-04-26 LAB — BASIC METABOLIC PANEL
Anion gap: 5 (ref 5–15)
BUN: 18 mg/dL (ref 8–23)
CO2: 24 mmol/L (ref 22–32)
Calcium: 8.5 mg/dL — ABNORMAL LOW (ref 8.9–10.3)
Chloride: 109 mmol/L (ref 98–111)
Creatinine, Ser: 0.58 mg/dL (ref 0.44–1.00)
GFR, Estimated: >60 mL/min (ref >60)
Glucose, Bld: 235 mg/dL — ABNORMAL HIGH (ref 70–99)
Potassium: 4.1 mmol/L (ref 3.5–5.1)
Sodium: 138 mmol/L (ref 135–145)

## 2020-04-26 MED ORDER — KETOROLAC TROMETHAMINE 15 MG/ML IJ SOLN
15.0000 mg | Freq: Four times a day (QID) | INTRAMUSCULAR | Status: DC | PRN
  Filled 2020-04-26: qty 1

## 2020-04-26 MED ORDER — OXYCODONE HCL 5 MG PO TABS: 5.0000 mg | ORAL_TABLET | ORAL | Status: DC | PRN

## 2020-04-26 MED ORDER — MORPHINE SULFATE (PF) 2 MG/ML IV SOLN: 1.0000 mg | INTRAVENOUS | Status: DC | PRN

## 2020-04-26 MED ORDER — GABAPENTIN 300 MG PO CAPS
300.0000 mg | ORAL_CAPSULE | Freq: Two times a day (BID) | ORAL | Status: DC
  Administered 2020-04-26 – 2020-04-30 (×9): 300 mg via ORAL
  Filled 2020-04-26 (×11): qty 1

## 2020-04-26 NOTE — Progress Notes (Signed)
PROGRESS NOTE    Suzanne Cantu  GEX:[id number] DOB: 04-25-1927 DOA: 04/21/2020 PCP: Corky Downs, MD   Brief Narrative:  86 year old female history of A. fib on Eliquis, CVA, seizure disorder who presents with a mechanical fall and right hip pain.  No loss of consciousness.  On admission x-ray shows severely comminuted and displaced intertrochanteric fracture right proximal femur.  Orthopedics consulted from admission.  Plan for operative fixation during this admission however patient is on Eliquis and we need to wait 3 days prior to administering spinal anesthesia.  Per orthopedics surgery scheduled for Saturday, 04/24/2020  Status post OR with orthopedics on 04/24/2020. Postoperative course been complicated by intermittent delirium and excessive lethargy. Also has had some postoperative urinary retention. Status post trach cath x2.  Assessment & Plan:   Principal Problem:   Closed right hip fracture (HCC) Active Problems:   Essential hypertension   Controlled type 2 diabetes mellitus without complication, without long-term current use of insulin (HCC)   History of CVA (cerebrovascular accident)   Fall   Atrial fibrillation, chronic (HCC)   Protein-calorie malnutrition, severe  Closed right hip fracture (HCC) Postoperative urinary retention Postoperative lethargy Severe community fracture seen on x-ray.  Severe pain on admission.  Now on multimodal regimen with better control. Orthopedic surgeon, Dr. Martha Clan was consulted.  Status post intramedullary fixation of right intertrochanteric hip fracture on 04/24/2020 Postop day 2.  Has some postoperative pain.   Had BM Postoperative urinary retention noted. Straight cath x2 completed. Plan: Multimodal pain control Continue intravenous fluids until tolerating p.o. As needed antiemetics Apixaban restarted Bowel regimen Bladder scans. Patient status post right calf x2. If need for straight cath 1 additional time strongly  consider placement of Foley. Flomax started. Tailor pain regimen to minimize narcotic use and decreased lethargy and confusion  Essential hypertension: -IV hydralazine as needed -Irbesartan -Continue amlodipine 5 mg daily for better control -Ensure pain control  Controlled type 2 diabetes mellitus without complication, without long-term current use of insulin (HCC)  Recent A1c 6.5, well controlled.   Patient taking Metformin at home -Sliding scale insulin -Hold Metformin  History of CVA (cerebrovascular accident) -Eliquis restarted  Fall:  -PT/OT, recommend skilled nursing facility  Chronic A fib: -Eliquis restarted -As needed metoprolol for sustained tachycardia   DVT prophylaxis: SCD Code Status: DNR Family Communication: Daughter at bedside 04/26/2020 Disposition Plan: Status is: Inpatient  Remains inpatient appropriate because:Inpatient level of care appropriate due to severity of illness   Dispo: The patient is from: Home              Anticipated d/c is to: SNF              Anticipated d/c date is: 2 days              Patient currently is not medically stable to d/c.  Postop day 1 status post intramedullary nail.  Uncomplicated postoperative course.  Does have some expected postoperative pain.  Therapy evaluations.  Anticipate skilled nursing facility at time of discharge.     Consultants:   Orthopedics  Procedures:   None  Antimicrobials:   None   Subjective: Seen and examined.  Postoperative day 1.  Endorses pain in the right hip consistent with postoperative pain.  Objective: Vitals:   04/25/20 1609 04/25/20 2354 04/26/20 0600 04/26/20 0718  BP: (!) 142/60 (!) 130/57  128/65  Pulse: (!) 109 (!) 105 98 96  Resp: 16 18  18   Temp: 99.1 F (37.3 C)  97.8 F (36.6 C)  98 F (36.7 C)  TempSrc: Axillary Oral  Oral  SpO2: 90% 98%  98%  Weight:      Height:        Intake/Output Summary (Last 24 hours) at 04/26/2020 1115 Last data filed  at 04/26/2020 0600 Gross per 24 hour  Intake 935.72 ml  Output 950 ml  Net -14.28 ml   Filed Weights   04/21/20 0919  Weight: 50.8 kg    Examination:  General exam: No acute distress.  Appears frail Respiratory system: Normal work of breathing.  Clear to auscultation.  Room air Cardiovascular system: Regular rate, irregular rhythm, no murmurs Gastrointestinal system: Scaphoid, nontender, nondistended, normal bowel sounds  Central nervous system: Alert, oriented to person and place.  No focal deficits  extremities: Right lower extremity in surgical wraps.  Not removed.  Extremity warm.  Good pedal pulses. skin: No rashes, lesions or ulcers Psychiatry: Judgement and insight appear normal. Mood & affect appropriate.     Data Reviewed: I have personally reviewed following labs and imaging studies  CBC: Recent Labs  Lab 04/21/20 1043 04/22/20 0627 04/24/20 0329 04/25/20 0436 04/26/20 0400  WBC 9.6 13.4* 8.5 8.6 7.5  NEUTROABS 7.9*  --  6.5 6.5 5.6  HGB 12.5 12.0 11.0* 9.8* 8.4*  HCT 37.0 34.8* 32.0* 28.3* 24.9*  MCV 89.4 89.2 89.6 89.8 91.9  PLT 202 195 126* 160 124*   Basic Metabolic Panel: Recent Labs  Lab 04/21/20 1043 04/22/20 0627 04/24/20 0329 04/25/20 0436 04/26/20 0400  NA 138 132* 133* 136 138  K 3.8 3.9 4.1 4.3 4.1  CL 100 96* 101 105 109  CO2 27 26 25 24 24   GLUCOSE 196* 203* 216* 230* 235*  BUN 7* 11 10 16 18   CREATININE 0.69 0.68 0.53 0.67 0.58  CALCIUM 9.5 9.3 8.5* 8.6* 8.5*   GFR: Estimated Creatinine Clearance: 36.2 mL/min (by C-G formula based on SCr of 0.58 mg/dL). Liver Function Tests: No results for input(s): AST, ALT, ALKPHOS, BILITOT, PROT, ALBUMIN in the last 168 hours. No results for input(s): LIPASE, AMYLASE in the last 168 hours. No results for input(s): AMMONIA in the last 168 hours. Coagulation Profile: Recent Labs  Lab 04/21/20 1521  INR 1.1   Cardiac Enzymes: No results for input(s): CKTOTAL, CKMB, CKMBINDEX, TROPONINI in  the last 168 hours. BNP (last 3 results) No results for input(s): PROBNP in the last 8760 hours. HbA1C: No results for input(s): HGBA1C in the last 72 hours. CBG: Recent Labs  Lab 04/25/20 0743 04/25/20 1140 04/25/20 1700 04/25/20 2053 04/26/20 0719  GLUCAP 211* 239* 253* 173* 192*   Lipid Profile: No results for input(s): CHOL, HDL, LDLCALC, TRIG, CHOLHDL, LDLDIRECT in the last 72 hours. Thyroid Function Tests: No results for input(s): TSH, T4TOTAL, FREET4, T3FREE, THYROIDAB in the last 72 hours. Anemia Panel: No results for input(s): VITAMINB12, FOLATE, FERRITIN, TIBC, IRON, RETICCTPCT in the last 72 hours. Sepsis Labs: No results for input(s): PROCALCITON, LATICACIDVEN in the last 168 hours.  Recent Results (from the past 240 hour(s))  Respiratory Panel by RT PCR (Flu A&B, Covid) - Nasopharyngeal Swab     Status: None   Collection Time: 04/21/20 10:43 AM   Specimen: Nasopharyngeal Swab  Result Value Ref Range Status   SARS Coronavirus 2 by RT PCR NEGATIVE NEGATIVE Final    Comment: (NOTE) SARS-CoV-2 target nucleic acids are NOT DETECTED.  The SARS-CoV-2 RNA is generally detectable in upper respiratoy specimens during the acute phase of  infection. The lowest concentration of SARS-CoV-2 viral copies this assay can detect is 131 copies/mL. A negative result does not preclude SARS-Cov-2 infection and should not be used as the sole basis for treatment or other patient management decisions. A negative result may occur with  improper specimen collection/handling, submission of specimen other than nasopharyngeal swab, presence of viral mutation(s) within the areas targeted by this assay, and inadequate number of viral copies (<131 copies/mL). A negative result must be combined with clinical observations, patient history, and epidemiological information. The expected result is Negative.  Fact Sheet for Patients:  https://www.moore.com/https://www.fda.gov/media/142436/download  Fact Sheet for  Healthcare Providers:  https://www.young.biz/https://www.fda.gov/media/142435/download  This test is no t yet approved or cleared by the [place] FDA and  has been authorized for detection and/or diagnosis of SARS-CoV-2 by FDA under an Emergency Use Authorization (EUA). This EUA will remain  in effect (meaning this test can be used) for the duration of the COVID-19 declaration under Section 564(b)(1) of the Act, 21 U.S.C. section 360bbb-3(b)(1), unless the authorization is terminated or revoked sooner.     Influenza A by PCR NEGATIVE NEGATIVE Final   Influenza B by PCR NEGATIVE NEGATIVE Final    Comment: (NOTE) The Xpert Xpress SARS-CoV-2/FLU/RSV assay is intended as an aid in  the diagnosis of influenza from Nasopharyngeal swab specimens and  should not be used as a sole basis for treatment. Nasal washings and  aspirates are unacceptable for Xpert Xpress SARS-CoV-2/FLU/RSV  testing.  Fact Sheet for Patients: https://www.moore.com/https://www.fda.gov/media/142436/download  Fact Sheet for Healthcare Providers: https://www.young.biz/https://www.fda.gov/media/142435/download  This test is not yet approved or cleared by the [place] FDA and  has been authorized for detection and/or diagnosis of SARS-CoV-2 by  FDA under an Emergency Use Authorization (EUA). This EUA will remain  in effect (meaning this test can be used) for the duration of the  Covid-19 declaration under Section 564(b)(1) of the Act, 21  U.S.C. section 360bbb-3(b)(1), unless the authorization is  terminated or revoked. Performed at [facility], [address] [place], [place] [address]   Culture, Urine     Status: Abnormal   Collection Time: 04/23/20 10:56 AM   Specimen: Urine, Random  Result Value Ref Range Status   Specimen Description   Final    URINE, RANDOM Performed at [facility], [address] [place], [place] [address]    Special Requests   Final    NONE Performed at [facility], [address] [place], [place]  [address]    Culture >=100,000 COLONIES/mL ENTEROCOCCUS FAECALIS (A)  Final   Report Status 04/25/2020 FINAL  Final   Organism ID, Bacteria ENTEROCOCCUS FAECALIS (A)  Final      Susceptibility   Enterococcus faecalis - MIC*    AMPICILLIN <=2 SENSITIVE Sensitive     NITROFURANTOIN <=16 SENSITIVE Sensitive     VANCOMYCIN 2 SENSITIVE Sensitive     * >=100,000 COLONIES/mL ENTEROCOCCUS FAECALIS         Radiology Studies: DG FEMUR, MIN 2 VIEWS RIGHT  Result Date: 04/24/2020 CLINICAL DATA:  Status post intramedullary rod fixation of right femoral fracture. EXAM: RIGHT FEMUR 2 VIEWS COMPARISON:  April 21, 2020. FINDINGS: Status post intramedullary rod fixation of proximal right femoral intertrochanteric fracture. Good alignment of fracture components is noted. Expected postoperative changes are seen in the surrounding soft tissues. Vascular calcifications are noted. IMPRESSION: Status post intramedullary rod fixation of proximal right femoral intertrochanteric fracture. Electronically Signed   By: Zenda AlpersJames  Green Jr M.D.  On: 04/24/2020 11:37        Scheduled Meds: . amLODipine  5 mg Oral Daily  . ampicillin  500 mg Oral Q6H  . apixaban  2.5 mg Oral BID  . Chlorhexidine Gluconate Cloth  6 each Topical Daily  . cholecalciferol  1,000 Units Oral QPC supper  . docusate sodium  100 mg Oral BID  . feeding supplement (GLUCERNA SHAKE)  237 mL Oral TID BM  . gabapentin  300 mg Oral BID  . insulin aspart  0-5 Units Subcutaneous QHS  . insulin aspart  0-9 Units Subcutaneous TID WC  . irbesartan  75 mg Oral Daily  . latanoprost  1 drop Both Eyes QHS  . multivitamin-lutein  1 capsule Oral BID  . senna  1 tablet Oral BID  . tamsulosin  0.4 mg Oral QPC supper  . vitamin B-12  500 mcg Oral Daily   Continuous Infusions: . sodium chloride 75 mL/hr at 04/24/20 0240  . sodium chloride 75 mL/hr (04/25/20 1609)     LOS: 5 days    Time spent: 25 minutes    Tresa Moore, MD [facility] Pager [phone]  If 7PM-7AM, please contact night-coverage 04/26/2020, 11:15 AM

## 2020-04-26 NOTE — Care Management Important Message (Signed)
Important Message  Patient Details  Name: Suzanne Cantu MRN: [id number] Date of Birth: [birth year]   Medicare Important Message Given:  Yes     [name] A [name] 04/26/2020, 10:39 AM

## 2020-04-26 NOTE — Progress Notes (Signed)
Physical Therapy Treatment Patient Details Name: Suzanne Cantu MRN: [id number] DOB: [birth year] Today's Date: 04/26/2020    History of Present Illness 86 year old female history of A. fib on Eliquis, CVA, seizure disorder who presents with a mechanical fall and right hip pain.  No loss of consciousness.  On admission x-ray shows severely comminuted and displaced intertrochanteric fracture right proximal femur. S/p IM nail of R hip on 04/24/20    PT Comments    Pt presents supine in bed on arrival to room and is agreeable to PT session. Pt performs bed mobility with maxA for supine<>sit. She performs 2 rounds of sit<>stand with maxA with blocking the R knee. During the second attempt, pt able to generate more muscle recruitment to stand up straighter and longer. Pt sits for a total of 15 minutes to help with core strength and maintaining balance with BOS. She performs 5 rounds of scooting up towards the [facility] while sitting EOB with modA+1. Once in bed, she is maxA+2 for rolling to straighten up bed linens. Once situated in bed, pt performs supine exercises and is encouraged to perform the ones written on the board throughout the day. She is left with in bed with all needs and daughter present. Monitored vitals throughout session is is listed below. Pt continues to benefit from skilled PT services to maximize return to PLOF and minimize risk of future falls, injury, caregiver burden, and readmission. Will continue to follow POC. Discharge recommendation remains appropriate.  Vitals during standing and sitting: O2 sats ranged from 95-97% on 1L HR ranged from 107-111   Follow Up Recommendations  SNF     Equipment Recommendations  None recommended by PT    Recommendations for Other Services       Precautions / Restrictions Precautions Precautions: Fall Restrictions Weight Bearing Restrictions: Yes RLE Weight Bearing: Weight bearing as tolerated    Mobility  Bed Mobility Overal bed  mobility: Needs Assistance Bed Mobility: Supine to Sit;Sit to Supine;Rolling Rolling: Max assist;+2 for physical assistance   Supine to sit: Max assist Sit to supine: Max assist   General bed mobility comments: Pt able to sit EOB with assistance with trunk and BLE. She required max of 2 for rolling and straighting up bed linens  Transfers Overall transfer level: Needs assistance Equipment used: 1 person hand held assist Transfers: Sit to/from Stand Sit to Stand: Max assist         General transfer comment: With R knee blocking, pt performs 2 sit<>stands with maxA. During the second attempt, pt able to generate more muscle recruitment to stand up straighter and longer  Ambulation/Gait             General Gait Details: Did not perform due to safety/fatigue   Stairs             Wheelchair Mobility    Modified Rankin (Stroke Patients Only)       Balance Overall balance assessment: Needs assistance Sitting-balance support: Feet supported Sitting balance-Leahy Scale: Fair Sitting balance - Comments: Pt able to maintain balance once feet are supported   Standing balance support: During functional activity Standing balance-Leahy Scale: Poor Standing balance comment: Pt unable to maintain balance without maxA support                            Cognition Arousal/Alertness: Awake/alert;Lethargic Behavior During Therapy: WFL for tasks assessed/performed Overall Cognitive Status: Within Functional Limits for tasks assessed  General Comments: Daughter notes that meds have made pt more lethargic, especially this morning, but she is doing better now      Exercises General Exercises - Lower Extremity Ankle Circles/Pumps: AROM;Both;10 reps;Supine Quad Sets: AROM;Right;10 reps;Supine Short Arc Quad: 10 reps;Right;Supine;AAROM Hip ABduction/ADduction: AAROM;Right;10 reps;Supine Other Exercises Other Exercises:  Pt performed 5 rounds of scooting up towards the HOB while sitting EOB with modA+1     General Comments        Pertinent Vitals/Pain Pain Assessment: Faces Faces Pain Scale: Hurts little more Pain Location: R hip Pain Descriptors / Indicators: Guarding;Operative site guarding Pain Intervention(s): Limited activity within patient's tolerance;Monitored during session    Home Living                      Prior Function            PT Goals (current goals can now be found in the care plan section) Acute Rehab PT Goals Patient Stated Goal: feel better PT Goal Formulation: With patient/family Time For Goal Achievement: 05/09/20 Potential to Achieve Goals: Good Progress towards PT goals: Progressing toward goals    Frequency    7X/week      PT Plan Current plan remains appropriate    Co-evaluation              AM-PAC PT "6 Clicks" Mobility   Outcome Measure  Help needed turning from your back to your side while in a flat bed without using bedrails?: A Lot Help needed moving from lying on your back to sitting on the side of a flat bed without using bedrails?: A Lot Help needed moving to and from a bed to a chair (including a wheelchair)?: A Lot Help needed standing up from a chair using your arms (e.g., wheelchair or bedside chair)?: Total Help needed to walk in hospital room?: Total Help needed climbing 3-5 steps with a railing? : Total 6 Click Score: 9    End of Session Equipment Utilized During Treatment: Gait belt;Oxygen Activity Tolerance: Patient limited by fatigue;Patient limited by pain Patient left: in bed;with call bell/phone within reach;with bed alarm set;with family/visitor present Nurse Communication: Mobility status PT Visit Diagnosis: Muscle weakness (generalized) (M62.81);Other abnormalities of gait and mobility (R26.89);Difficulty in walking, not elsewhere classified (R26.2);Pain Pain - Right/Left: Right Pain - part of body: Hip      Time: 5284-1324 PT Time Calculation (min) (ACUTE ONLY): 41 min  Charges:                         Katherine Basset, SPT Baker Pierini 04/26/2020, 5:10 PM

## 2020-04-26 NOTE — Plan of Care (Signed)
  Problem: Clinical Measurements: Goal: Will remain free from infection Outcome: Progressing Goal: Diagnostic test results will improve Outcome: Progressing Goal: Respiratory complications will improve Outcome: Progressing Goal: Cardiovascular complication will be avoided Outcome: Progressing   

## 2020-04-26 NOTE — Progress Notes (Signed)
Pt's daughter was concerned that the pt had no urine output. Bladder scanned pt to find that she was retaining 640 mL. Notified NP - received orders to I&O cath the pt. Catheterization successful; pt tolerated well. Will continue to monitor.

## 2020-04-26 NOTE — Progress Notes (Signed)
Inpatient Diabetes Program Recommendations  AACE/ADA: New Consensus Statement on Inpatient Glycemic Control (2015)  Target Ranges:  Prepandial:   less than 140 mg/dL      [facility] postprandial:   less than 180 mg/dL (1-2 hours)      Critically ill patients:  140 - 180 mg/dL   Lab Results  Component Value Date   GLUCAP 192 (H) 04/26/2020   HGBA1C 7.3 (H) 04/21/2020    Review of Glycemic Control Results for Suzanne Cantu, Suzanne Cantu (MRN [id number]) as of 04/26/2020 09:52  Ref. Range 04/25/2020 11:40 04/25/2020 17:00 04/25/2020 20:53 04/26/2020 07:19  Glucose-Capillary Latest Ref Range: 70 - 99 mg/dL 771 (H) 165 (H) 790 (H) 192 (H)   Diabetes history: DM 2 Outpatient Diabetes medications:  Metformin 500 mg bid Current orders for Inpatient glycemic control:  Novolog sensitive tid with meals and HS Inpatient Diabetes Program Recommendations:    Blood sugars slightly greater then hospital goals.  Consider adding Levemir 6 units q HS while in the hospital and oral agents on hold.     Thanks  Beryl Meager, RN, BC-ADM Inpatient Diabetes Coordinator Pager [phone] (8a-5p)

## 2020-04-26 NOTE — TOC Progression Note (Addendum)
Transition of Care (TOC) - Progression Note    Patient Details  Name: Suzanne Cantu MRN: [id number] Date of Birth: 07/22/1926  Transition of Care (TOC) CM/SW Contact   D , RN Phone Number: 04/26/2020, 3:27 PM  Clinical Narrative:   RNCM met with patient and daughter in room. Patient resting with eyes closed, daughter reports she seems to be improving some in her mental status since stopping the pain medications. RNCM discussed current recommendations for SNF and daughter reports they now believe this will need to be the plan. Daughter asking questions about [facility] however informed by this CM that they are not open to the public, daughter then discussing [facility] but notified that patient must be fully vaccinated to go to [facility]. Daughter's third choice is [facility]. This RNCM verbalized that she would send bed request to them, daughter will let this CM know if they come up with any other places.   RNCM sent bed request to [facility].     Expected Discharge Plan: Skilled Nursing Facility Barriers to Discharge: Continued Medical Work up  Expected Discharge Plan and Services Expected Discharge Plan: Skilled Nursing Facility In-house Referral: Clinical Social Work   Post Acute Care Choice: Home Health Living arrangements for the past 2 months: Single Family Home                                       Social Determinants of Health (SDOH) Interventions    Readmission Risk Interventions No flowsheet data found.  

## 2020-04-26 NOTE — Progress Notes (Signed)
Subjective:  POD #2 s/p intramedullary fixation for right intertrochanteric hip fracture.   Patient reports right hip pain as mild to moderate.  Patient's daughters are at the bedside.  Objective:   VITALS:   Vitals:   04/25/20 2354 04/26/20 0600 04/26/20 0718 04/26/20 1620  BP: (!) 130/57  128/65 (!) 146/59  Pulse: (!) 105 98 96 (!) 103  Resp: 18  18 18   Temp: 97.8 F (36.6 C)  98 F (36.7 C) 98.8 F (37.1 C)  TempSrc: Oral  Oral Oral  SpO2: 98%  98% 97%  Weight:      Height:        PHYSICAL EXAM: Right lower extremity: Neurovascular intact Sensation intact distally Intact pulses distally Dorsiflexion/Plantar flexion intact Incision: scant drainage No cellulitis present Compartment soft  LABS  Results for orders placed or performed during the hospital encounter of 04/21/20 (from the past 24 hour(s))  Glucose, capillary     Status: Abnormal   Collection Time: 04/25/20  8:53 PM  Result Value Ref Range   Glucose-Capillary 173 (H) 70 - 99 mg/dL  CBC with Differential/Platelet     Status: Abnormal   Collection Time: 04/26/20  4:00 AM  Result Value Ref Range   WBC 7.5 4.0 - 10.5 K/uL   RBC 2.71 (L) 3.87 - 5.11 MIL/uL   Hemoglobin 8.4 (L) 12.0 - 15.0 g/dL   HCT 04/28/20 (L) 36 - 46 %   MCV 91.9 80.0 - 100.0 fL   MCH 31.0 26.0 - 34.0 pg   MCHC 33.7 30.0 - 36.0 g/dL   RDW 81.4 48.1 - 85.6 %   Platelets 124 (L) 150 - 400 K/uL   nRBC 0.0 0.0 - 0.2 %   Neutrophils Relative % 76 %   Neutro Abs 5.6 1.7 - 7.7 K/uL   Lymphocytes Relative 12 %   Lymphs Abs 0.9 0.7 - 4.0 K/uL   Monocytes Relative 11 %   Monocytes Absolute 0.8 0.1 - 1.0 K/uL   Eosinophils Relative 1 %   Eosinophils Absolute 0.1 0.0 - 0.5 K/uL   Basophils Relative 0 %   Basophils Absolute 0.0 0.0 - 0.1 K/uL   Immature Granulocytes 0 %   Abs Immature Granulocytes 0.02 0.00 - 0.07 K/uL  Basic metabolic panel     Status: Abnormal   Collection Time: 04/26/20  4:00 AM  Result Value Ref Range   Sodium 138 135  - 145 mmol/L   Potassium 4.1 3.5 - 5.1 mmol/L   Chloride 109 98 - 111 mmol/L   CO2 24 22 - 32 mmol/L   Glucose, Bld 235 (H) 70 - 99 mg/dL   BUN 18 8 - 23 mg/dL   Creatinine, Ser 04/28/20 0.44 - 1.00 mg/dL   Calcium 8.5 (L) 8.9 - 10.3 mg/dL   GFR, Estimated 9.70 >26 mL/min   Anion gap 5 5 - 15  Glucose, capillary     Status: Abnormal   Collection Time: 04/26/20  7:19 AM  Result Value Ref Range   Glucose-Capillary 192 (H) 70 - 99 mg/dL  Glucose, capillary     Status: Abnormal   Collection Time: 04/26/20 11:32 AM  Result Value Ref Range   Glucose-Capillary 202 (H) 70 - 99 mg/dL  Glucose, capillary     Status: Abnormal   Collection Time: 04/26/20  4:21 PM  Result Value Ref Range   Glucose-Capillary 225 (H) 70 - 99 mg/dL    No results found.  Assessment/Plan: 2 Days Post-Op   Principal Problem:  Closed right hip fracture (HCC) Active Problems:   Essential hypertension   Controlled type 2 diabetes mellitus without complication, without long-term current use of insulin (HCC)   History of CVA (cerebrovascular accident)   Fall   Atrial fibrillation, chronic (HCC)   Protein-calorie malnutrition, severe  Patient more alert now postop.  Continue with physical therapy.  Patient on Eliquis.  Patient is weightbearing as tolerated on the left lower extremity.  Patient will follow up in the office 10 to 14 days after discharge from the hospital.    Juanell Fairly , MD 04/26/2020, 7:25 PM

## 2020-04-27 DIAGNOSIS — S72001A Fracture of unspecified part of neck of right femur, initial encounter for closed fracture: Secondary | ICD-10-CM | POA: Diagnosis not present

## 2020-04-27 DIAGNOSIS — E119 Type 2 diabetes mellitus without complications: Secondary | ICD-10-CM | POA: Diagnosis not present

## 2020-04-27 DIAGNOSIS — I482 Chronic atrial fibrillation, unspecified: Secondary | ICD-10-CM | POA: Diagnosis not present

## 2020-04-27 DIAGNOSIS — I10 Essential (primary) hypertension: Secondary | ICD-10-CM | POA: Diagnosis not present

## 2020-04-27 LAB — GLUCOSE, CAPILLARY
Glucose-Capillary: 199 mg/dL — ABNORMAL HIGH (ref 70–99)
Glucose-Capillary: 242 mg/dL — ABNORMAL HIGH (ref 70–99)
Glucose-Capillary: 247 mg/dL — ABNORMAL HIGH (ref 70–99)
Glucose-Capillary: 264 mg/dL — ABNORMAL HIGH (ref 70–99)

## 2020-04-27 LAB — BASIC METABOLIC PANEL
Anion gap: 7 (ref 5–15)
BUN: 17 mg/dL (ref 8–23)
CO2: 24 mmol/L (ref 22–32)
Calcium: 8.2 mg/dL — ABNORMAL LOW (ref 8.9–10.3)
Chloride: 105 mmol/L (ref 98–111)
Creatinine, Ser: 0.54 mg/dL (ref 0.44–1.00)
GFR, Estimated: >60 mL/min (ref >60)
Glucose, Bld: 211 mg/dL — ABNORMAL HIGH (ref 70–99)
Potassium: 3.6 mmol/L (ref 3.5–5.1)
Sodium: 136 mmol/L (ref 135–145)

## 2020-04-27 MED ORDER — IBUPROFEN 400 MG PO TABS
400.0000 mg | ORAL_TABLET | Freq: Four times a day (QID) | ORAL | Status: DC | PRN
  Administered 2020-04-27 – 2020-05-01 (×7): 400 mg via ORAL
  Filled 2020-04-27 (×7): qty 1

## 2020-04-27 MED ORDER — INSULIN GLARGINE 100 UNIT/ML ~~LOC~~ SOLN
8.0000 [IU] | Freq: Every day | SUBCUTANEOUS | Status: DC
  Administered 2020-04-27 – 2020-04-28 (×2): 8 [IU] via SUBCUTANEOUS
  Filled 2020-04-27 (×3): qty 0.08

## 2020-04-27 NOTE — Progress Notes (Signed)
Occupational Therapy Treatment Patient Details Name: Suzanne Cantu MRN: [id number] DOB: August 09, 1926 Today's Date: 04/27/2020    History of present illness 86 year old female history of A. fib on Eliquis, CVA, seizure disorder who presents with a mechanical fall and right hip pain.  No loss of consciousness.  On admission x-ray shows severely comminuted and displaced intertrochanteric fracture right proximal femur. S/p IM nail of R hip on 04/24/20   OT comments  Ms Desrocher was seen for OT treatment on this date, overlapping c PT for safe mobility. Upon arrival to room pt easily awakes and agreeable to tx. Pt found on RA, SpO2 stable at 95% on RA t/o session. Pt reports 6/10 pain increasing to 8/10 c mobility - RN notified. MAX A to exit R side of bed.  Initial CGA improving to SBA tooth brushing seated EOB. MAX A initial sit<>stand. MAX A x2 + RW for weight shifting in standing and improved upright posture - pt able to slide RLE ~2inches along floor. Pt left seated EOB c PT and family present at end of session. Pt and family verbalized understanding of instruction provided. Pt making good progress toward goals. Pt continues to benefit from skilled OT services to maximize return to PLOF and minimize risk of future falls, injury, caregiver burden, and readmission. Will continue to follow POC. Discharge recommendation remains appropriate.    Follow Up Recommendations  SNF    Equipment Recommendations  Other (comment) (TBD)    Recommendations for Other Services      Precautions / Restrictions Precautions Precautions: Fall Restrictions Weight Bearing Restrictions: Yes RLE Weight Bearing: Weight bearing as tolerated       Mobility Bed Mobility Overal bed mobility: Needs Assistance Bed Mobility: Supine to Sit     Supine to sit: Max assist        Transfers Overall transfer level: Needs assistance Equipment used: Rolling walker (2 wheeled) Transfers: Sit to/from Stand Sit to  Stand: Max assist;+2 physical assistance         General transfer comment: MAX A sit<>stand. MAX A x2 + RW for weight shifting standing and improved upright posture    Balance Overall balance assessment: Needs assistance Sitting-balance support: Feet supported Sitting balance-Leahy Scale: Fair     Standing balance support: Bilateral upper extremity supported Standing balance-Leahy Scale: Poor Standing balance comment: MAX A for weight shifting and BUE support on RW          ADL either performed or assessed with clinical judgement   ADL Overall ADL's : Needs assistance/impaired      General ADL Comments: Initial CGA improving to SBA tooth brushing seated EOB. MAX A x2 + RW for ADL t/f.                Cognition Arousal/Alertness: Awake/alert Behavior During Therapy: WFL for tasks assessed/performed Overall Cognitive Status: Within Functional Limits for tasks assessed                 Exercises Exercises: Other exercises Other Exercises Other Exercises: Pt and family educated re: OT role, DME recs, falls prevention, ECS, pain mgmt  Other Exercises: LBD, tooth brushing, sup>sit, sit<>stand x3, sitting/standing balance/tolerance   Shoulder Instructions       General Comments SpO2 95% on RA t/o     Pertinent Vitals/ Pain       Pain Assessment: 0-10 Pain Score: 8  Pain Location: R hip Pain Descriptors / Indicators: Guarding;Operative site guarding Pain Intervention(s): Limited activity within patient's tolerance;Monitored during session;Repositioned;Premedicated  before session         Frequency  Min 2X/week        Progress Toward Goals  OT Goals(current goals can now be found in the care plan section)  Progress towards OT goals: Progressing toward goals  Acute Rehab OT Goals Patient Stated Goal: feel better OT Goal Formulation: With patient/family Time For Goal Achievement: 05/09/20 Potential to Achieve Goals: Good ADL Goals Pt Will Perform  Grooming: with min guard assist;sitting Pt Will Perform Lower Body Dressing: with max assist;sitting/lateral leans Pt Will Transfer to Toilet: with max assist;squat pivot transfer;bedside commode (c LRAD PRN)  Plan Discharge plan remains appropriate;Frequency needs to be updated (Fq updated 2/2 improved participation)       AM-PAC OT "6 Clicks" Daily Activity     Outcome Measure   Help from another person eating meals?: A Little Help from another person taking care of personal grooming?: A Little Help from another person toileting, which includes using toliet, bedpan, or urinal?: A Lot Help from another person bathing (including washing, rinsing, drying)?: A Lot Help from another person to put on and taking off regular upper body clothing?: A Little Help from another person to put on and taking off regular lower body clothing?: A Lot 6 Click Score: 15    End of Session Equipment Utilized During Treatment: Gait belt;Rolling walker  OT Visit Diagnosis: Other abnormalities of gait and mobility (R26.89);Muscle weakness (generalized) (M62.81)   Activity Tolerance Patient tolerated treatment well;Patient limited by pain   Patient Left in bed;with family/visitor present;Other (comment) (Seated OEB c PT in room at end of session)   Nurse Communication Patient requests pain meds        Time: 3810-1751 OT Time Calculation (min): 24 min  Charges: OT General Charges $OT Visit: 1 Visit OT Treatments $Self Care/Home Management : 8-22 mins $Therapeutic Activity: 8-22 mins  Kathie Dike, M.S. OTR/L  04/27/20, 10:56 AM  ascom [phone]

## 2020-04-27 NOTE — Progress Notes (Signed)
Received report from Elease Hashimoto, Charity fundraiser. Assuming care of patient at this time.

## 2020-04-27 NOTE — Progress Notes (Signed)
  Subjective:  Patient seen at 2 pm.  POD #3 s/p IM fixation for right IT hip fracture.   Patient reports right hip pain as mild to moderate.  Patient was sleeping comfortably.  Her daughter is at the bedside.  She states the patient walked a few steps with PT earlier.    Objective:   VITALS:   Vitals:   04/26/20 0718 04/26/20 1620 04/26/20 2354 04/27/20 0754  BP: 128/65 (!) 146/59 (!) 145/63 (!) 151/68  Pulse: 96 (!) 103 (!) 106 99  Resp: 18 18 16 18   Temp: 98 F (36.7 C) 98.8 F (37.1 C) 98.2 F (36.8 C) 98 F (36.7 C)  TempSrc: Oral Oral Oral Oral  SpO2: 98% 97% 92% 98%  Weight:      Height:        PHYSICAL EXAM: Right lower extremity Neurovascular intact Sensation intact distally Intact pulses distally Dorsiflexion/Plantar flexion intact Superior dressing: moderate drainage, all others clean and dry No cellulitis present Compartment soft  LABS  Results for orders placed or performed during the hospital encounter of 04/21/20 (from the past 24 hour(s))  Glucose, capillary     Status: Abnormal   Collection Time: 04/26/20  4:21 PM  Result Value Ref Range   Glucose-Capillary 225 (H) 70 - 99 mg/dL  Glucose, capillary     Status: Abnormal   Collection Time: 04/26/20  9:14 PM  Result Value Ref Range   Glucose-Capillary 225 (H) 70 - 99 mg/dL   Comment 1 Notify RN    Comment 2 Document in Chart   Basic metabolic panel     Status: Abnormal   Collection Time: 04/27/20  6:44 AM  Result Value Ref Range   Sodium 136 135 - 145 mmol/L   Potassium 3.6 3.5 - 5.1 mmol/L   Chloride 105 98 - 111 mmol/L   CO2 24 22 - 32 mmol/L   Glucose, Bld 211 (H) 70 - 99 mg/dL   BUN 17 8 - 23 mg/dL   Creatinine, Ser 04/29/20 0.44 - 1.00 mg/dL   Calcium 8.2 (L) 8.9 - 10.3 mg/dL   GFR, Estimated 3.47 >42 mL/min   Anion gap 7 5 - 15  Glucose, capillary     Status: Abnormal   Collection Time: 04/27/20  7:55 AM  Result Value Ref Range   Glucose-Capillary 199 (H) 70 - 99 mg/dL  Glucose, capillary      Status: Abnormal   Collection Time: 04/27/20 11:34 AM  Result Value Ref Range   Glucose-Capillary 242 (H) 70 - 99 mg/dL    No results found.  Assessment/Plan: 3 Days Post-Op   Principal Problem:   Closed right hip fracture (HCC) Active Problems:   Essential hypertension   Controlled type 2 diabetes mellitus without complication, without long-term current use of insulin (HCC)   History of CVA (cerebrovascular accident)   Fall   Atrial fibrillation, chronic (HCC)   Protein-calorie malnutrition, severe  Continue with PT.  WBAT on right lower extremity.   Patient on eliquis. Had a lengthy discussion with daughter about discharge plan.   She is considering taking her mother home due to the fact that her mother only has had 1 of 2 COVID vaccine shots and would have to be quarantined at [facility] and isolated from family for 2 weeks.  Will continue to follow progress.    [facility] , MD 04/27/2020, 3:40 PM

## 2020-04-27 NOTE — Progress Notes (Signed)
Physical Therapy Treatment Patient Details Name: Suzanne Cantu MRN: [id number] DOB: [birth year] Today's Date: 04/27/2020    History of Present Illness 86 year old female history of A. fib on Eliquis, CVA, seizure disorder who presents with a mechanical fall and right hip pain.  No loss of consciousness.  On admission x-ray shows severely comminuted and displaced intertrochanteric fracture right proximal femur. S/p IM nail of R hip on 04/24/20    PT Comments    Pt sitting EOB with OT finishing up on arrival to room. OT stayed to help with safe mobility. Pt performs to 2 sit<>stands with maxA+2 with BUE on RW and able to perform weight shifting. She is able to slide RLE ~2inches along floor. Pt performs sitting EOB exercises and able to maintain balance with external perturbations. Pt performed 2 rounds of scooting up towards the HOB while sitting EOB with modA+1. Pt able to lay supine with assistance with trunk and BLE. She is max+2 to scoot her up higher in bed. Pt encouraged to continue performing exercises written on white board and to use the spirometer throughout her day. Patient left in bed with all needs and nursing present in room to administer pain medication. Monitored vitals throughout and O2 sats stayed around 95%. Pt continues to benefit from skilled PT services to maximize return to PLOF and minimize risk of future falls, injury, caregiver burden, and readmission. Will continue to follow POC. Discharge recommendation remains appropriate.   Follow Up Recommendations  SNF     Equipment Recommendations  None recommended by PT    Recommendations for Other Services       Precautions / Restrictions Precautions Precautions: Fall Restrictions Weight Bearing Restrictions: Yes RLE Weight Bearing: Weight bearing as tolerated    Mobility  Bed Mobility Overal bed mobility: Needs Assistance Bed Mobility: Sit to Supine     Supine to sit: Max assist Sit to supine: Max assist    General bed mobility comments: Pt able to lay supine with assistance with trunk and BLE. She was max+2 to scoot her up higher in bed  Transfers Overall transfer level: Needs assistance Equipment used: Rolling walker (2 wheeled) Transfers: Sit to/from Stand Sit to Stand: Max assist;+2 physical assistance         General transfer comment: MAX A x2 + RW for weight shifting standing and improved upright posture  Ambulation/Gait             General Gait Details: Did not perform due to safety/fatigue   Stairs             Wheelchair Mobility    Modified Rankin (Stroke Patients Only)       Balance Overall balance assessment: Needs assistance Sitting-balance support: Feet supported Sitting balance-Leahy Scale: Fair Sitting balance - Comments: Pt able to maintain balance once feet are supported   Standing balance support: Bilateral upper extremity supported Standing balance-Leahy Scale: Poor Standing balance comment: MAX A for weight shifting and BUE support on RW                            Cognition Arousal/Alertness: Awake/alert Behavior During Therapy: WFL for tasks assessed/performed Overall Cognitive Status: Within Functional Limits for tasks assessed                                        Exercises General Exercises -  Lower Extremity Long Arc Quad: AROM;Both;10 reps;Seated Hip ABduction/ADduction: AROM;Right;10 reps;Seated Hip Flexion/Marching: AROM;Both;10 reps;Seated Other Exercises Other Exercises: Pt and family educated re: OT role, DME recs, falls prevention, ECS, pain mgmt  Other Exercises: LBD, tooth brushing, sup>sit, sit<>stand x3, sitting/standing balance/tolerance Other Exercises: Pt performed 2 rounds of scooting up towards the HOB while sitting EOB with modA+1 Other Exercises: Pt able to maintain sitting balance with external pertubations x 15 seconds    General Comments General comments (skin integrity, edema,  etc.): SpO2 95% on RA t/o       Pertinent Vitals/Pain Pain Assessment: 0-10 Pain Score: 8  Pain Location: R hip Pain Descriptors / Indicators: Guarding;Operative site guarding Pain Intervention(s): Limited activity within patient's tolerance;Patient requesting pain meds-RN notified;Repositioned;Monitored during session    Home Living                      Prior Function            PT Goals (current goals can now be found in the care plan section) Acute Rehab PT Goals Patient Stated Goal: feel better PT Goal Formulation: With patient/family Time For Goal Achievement: 05/09/20 Potential to Achieve Goals: Good Progress towards PT goals: Progressing toward goals    Frequency    7X/week      PT Plan Current plan remains appropriate    Co-evaluation              AM-PAC PT "6 Clicks" Mobility   Outcome Measure  Help needed turning from your back to your side while in a flat bed without using bedrails?: A Lot Help needed moving from lying on your back to sitting on the side of a flat bed without using bedrails?: A Lot Help needed moving to and from a bed to a chair (including a wheelchair)?: A Lot Help needed standing up from a chair using your arms (e.g., wheelchair or bedside chair)?: Total Help needed to walk in hospital room?: Total Help needed climbing 3-5 steps with a railing? : Total 6 Click Score: 9    End of Session Equipment Utilized During Treatment: Gait belt;Oxygen Activity Tolerance: Patient limited by fatigue;Patient limited by pain Patient left: in bed;with call bell/phone within reach;with bed alarm set;with nursing/sitter in room Nurse Communication: Mobility status PT Visit Diagnosis: Muscle weakness (generalized) (M62.81);Other abnormalities of gait and mobility (R26.89);Difficulty in walking, not elsewhere classified (R26.2);Pain Pain - Right/Left: Right Pain - part of body: Hip     Time: 1023-1050 PT Time Calculation (min) (ACUTE  ONLY): 27 min  Charges:                         Katherine Basset, SPT Baker Pierini 04/27/2020, 12:51 PM

## 2020-04-27 NOTE — Progress Notes (Signed)
PROGRESS NOTE    Suzanne Cantu  JOI:[id number] DOB: [birth year] DOA: 04/21/2020 PCP: Corky Downs, MD   Brief Narrative:  86 year old female history of A. fib on Eliquis, CVA, seizure disorder who presents with a mechanical fall and right hip pain.  No loss of consciousness.  On admission x-ray shows severely comminuted and displaced intertrochanteric fracture right proximal femur.  Orthopedics consulted from admission.  Plan for operative fixation during this admission however patient is on Eliquis and we need to wait 3 days prior to administering spinal anesthesia.  Per orthopedics surgery scheduled for Saturday, 04/24/2020  Status post OR with orthopedics on 04/24/2020. Postoperative course been complicated by intermittent delirium and excessive lethargy.  She seemed to be improving after minimization of sedating medications.  Daughter at bedside.   Assessment & Plan:   Principal Problem:   Closed right hip fracture (HCC) Active Problems:   Essential hypertension   Controlled type 2 diabetes mellitus without complication, without long-term current use of insulin (HCC)   History of CVA (cerebrovascular accident)   Fall   Atrial fibrillation, chronic (HCC)   Protein-calorie malnutrition, severe  Closed right hip fracture (HCC) Postoperative urinary retention Postoperative lethargy Severe community fracture seen on x-ray.  Severe pain on admission.  Now on multimodal regimen with better control. Orthopedic surgeon, Dr. Martha Clan was consulted.  Status post intramedullary fixation of right intertrochanteric hip fracture on 04/24/2020 Postop day 3.  Postoperative pain control improving Had BM Patient required straight cath x3 Foley catheter now in place.  Flomax initiated Plan: Multimodal pain control Continue intravenous fluids until tolerating sufficient oral intake As needed antiemetics Apixaban restarted Bowel regimen Continue Foley catheter for now Continue  Flomax Patient remains in house for the next 24 to 48 hours can consider another voiding trial while she is here.  Otherwise plan to discharge to skilled nursing facility with catheter in place and voiding trial post discharge. Tailor pain regimen to minimize narcotic use and decreased lethargy and confusion  Essential hypertension: -IV hydralazine as needed -Irbesartan -Continue amlodipine 5 mg daily for better control -Ensure pain control  Controlled type 2 diabetes mellitus without complication, without long-term current use of insulin (HCC)  Recent A1c 6.5, well controlled.   Patient taking Metformin at home -Sliding scale insulin -Hold Metformin  History of CVA (cerebrovascular accident) -Eliquis restarted  Fall:  -PT/OT, recommend skilled nursing facility  Chronic A fib: -Eliquis restarted -As needed metoprolol for sustained tachycardia   DVT prophylaxis: SCD Code Status: DNR Family Communication: Daughter at bedside 04/27/2020 Disposition Plan: Status is: Inpatient  Remains inpatient appropriate because:Inpatient level of care appropriate due to severity of illness   Dispo: The patient is from: Home              Anticipated d/c is to: SNF              Anticipated d/c date is: 2 days              Patient currently is not medically stable to d/c.  Postop day 3 status post IM nail for commuted hip fracture.  Postoperative course been complicated by lethargy and altered level of consciousness.  Tailoring pain regimen.  Also has had postoperative urinary retention.  Straight cath x3.  Foley catheter now in place.  Consider voiding trial within the next 24 hours or if the patient is able to discharge can defer voiding trial to outpatient.   Consultants:   Orthopedics  Procedures:   None  Antimicrobials:   None   Subjective: Seen and examined.  Daughter at bedside.  Postop day #3.  Pain control improving.  Oral intake slowly  improving.  Objective: Vitals:   04/26/20 0718 04/26/20 1620 04/26/20 2354 04/27/20 0754  BP: 128/65 (!) 146/59 (!) 145/63 (!) 151/68  Pulse: 96 (!) 103 (!) 106 99  Resp: 18 18 16 18   Temp: 98 F (36.7 C) 98.8 F (37.1 C) 98.2 F (36.8 C) 98 F (36.7 C)  TempSrc: Oral Oral Oral Oral  SpO2: 98% 97% 92% 98%  Weight:      Height:        Intake/Output Summary (Last 24 hours) at 04/27/2020 1416 Last data filed at 04/27/2020 0900 Gross per 24 hour  Intake 1495.54 ml  Output 1400 ml  Net 95.54 ml   Filed Weights   04/21/20 0919  Weight: 50.8 kg    Examination:  General exam: No acute distress.  Appears frail Respiratory system: Normal work of breathing.  Clear to auscultation.  Room air Cardiovascular system: Regular rate, irregular rhythm, no murmurs Gastrointestinal system: Scaphoid, nontender, nondistended, normal bowel sounds  Central nervous system: Alert, oriented to person and place.  No focal deficits  extremities: Right lower extremity slightly tender to touch.  Extremity warm.  Good pedal pulses. skin: No rashes, lesions or ulcers Psychiatry: Judgement and insight appear normal. Mood & affect appropriate.     Data Reviewed: I have personally reviewed following labs and imaging studies  CBC: Recent Labs  Lab 04/21/20 1043 04/22/20 0627 04/24/20 0329 04/25/20 0436 04/26/20 0400  WBC 9.6 13.4* 8.5 8.6 7.5  NEUTROABS 7.9*  --  6.5 6.5 5.6  HGB 12.5 12.0 11.0* 9.8* 8.4*  HCT 37.0 34.8* 32.0* 28.3* 24.9*  MCV 89.4 89.2 89.6 89.8 91.9  PLT 202 195 126* 160 124*   Basic Metabolic Panel: Recent Labs  Lab 04/22/20 0627 04/24/20 0329 04/25/20 0436 04/26/20 0400 04/27/20 0644  NA 132* 133* 136 138 136  K 3.9 4.1 4.3 4.1 3.6  CL 96* 101 105 109 105  CO2 26 25 24 24 24   GLUCOSE 203* 216* 230* 235* 211*  BUN 11 10 16 18 17   CREATININE 0.68 0.53 0.67 0.58 0.54  CALCIUM 9.3 8.5* 8.6* 8.5* 8.2*   GFR: Estimated Creatinine Clearance: 36.2 mL/min (by C-G  formula based on SCr of 0.54 mg/dL). Liver Function Tests: No results for input(s): AST, ALT, ALKPHOS, BILITOT, PROT, ALBUMIN in the last 168 hours. No results for input(s): LIPASE, AMYLASE in the last 168 hours. No results for input(s): AMMONIA in the last 168 hours. Coagulation Profile: Recent Labs  Lab 04/21/20 1521  INR 1.1   Cardiac Enzymes: No results for input(s): CKTOTAL, CKMB, CKMBINDEX, TROPONINI in the last 168 hours. BNP (last 3 results) No results for input(s): PROBNP in the last 8760 hours. HbA1C: No results for input(s): HGBA1C in the last 72 hours. CBG: Recent Labs  Lab 04/26/20 1132 04/26/20 1621 04/26/20 2114 04/27/20 0755 04/27/20 1134  GLUCAP 202* 225* 225* 199* 242*   Lipid Profile: No results for input(s): CHOL, HDL, LDLCALC, TRIG, CHOLHDL, LDLDIRECT in the last 72 hours. Thyroid Function Tests: No results for input(s): TSH, T4TOTAL, FREET4, T3FREE, THYROIDAB in the last 72 hours. Anemia Panel: No results for input(s): VITAMINB12, FOLATE, FERRITIN, TIBC, IRON, RETICCTPCT in the last 72 hours. Sepsis Labs: No results for input(s): PROCALCITON, LATICACIDVEN in the last 168 hours.  Recent Results (from the past 240 hour(s))  Respiratory Panel by RT  PCR (Flu A&B, Covid) - Nasopharyngeal Swab     Status: None   Collection Time: 04/21/20 10:43 AM   Specimen: Nasopharyngeal Swab  Result Value Ref Range Status   SARS Coronavirus 2 by RT PCR NEGATIVE NEGATIVE Final    Comment: (NOTE) SARS-CoV-2 target nucleic acids are NOT DETECTED.  The SARS-CoV-2 RNA is generally detectable in upper respiratoy specimens during the acute phase of infection. The lowest concentration of SARS-CoV-2 viral copies this assay can detect is 131 copies/mL. A negative result does not preclude SARS-Cov-2 infection and should not be used as the sole basis for treatment or other patient management decisions. A negative result may occur with  improper specimen collection/handling,  submission of specimen other than nasopharyngeal swab, presence of viral mutation(s) within the areas targeted by this assay, and inadequate number of viral copies (<131 copies/mL). A negative result must be combined with clinical observations, patient history, and epidemiological information. The expected result is Negative.  Fact Sheet for Patients:  https://www.moore.com/https://www.fda.gov/media/142436/download  Fact Sheet for Healthcare Providers:  https://www.young.biz/https://www.fda.gov/media/142435/download  This test is no t yet approved or cleared by the [place] FDA and  has been authorized for detection and/or diagnosis of SARS-CoV-2 by FDA under an Emergency Use Authorization (EUA). This EUA will remain  in effect (meaning this test can be used) for the duration of the COVID-19 declaration under Section 564(b)(1) of the Act, 21 U.S.C. section 360bbb-3(b)(1), unless the authorization is terminated or revoked sooner.     Influenza A by PCR NEGATIVE NEGATIVE Final   Influenza B by PCR NEGATIVE NEGATIVE Final    Comment: (NOTE) The Xpert Xpress SARS-CoV-2/FLU/RSV assay is intended as an aid in  the diagnosis of influenza from Nasopharyngeal swab specimens and  should not be used as a sole basis for treatment. Nasal washings and  aspirates are unacceptable for Xpert Xpress SARS-CoV-2/FLU/RSV  testing.  Fact Sheet for Patients: https://www.moore.com/https://www.fda.gov/media/142436/download  Fact Sheet for Healthcare Providers: https://www.young.biz/https://www.fda.gov/media/142435/download  This test is not yet approved or cleared by the [place] FDA and  has been authorized for detection and/or diagnosis of SARS-CoV-2 by  FDA under an Emergency Use Authorization (EUA). This EUA will remain  in effect (meaning this test can be used) for the duration of the  Covid-19 declaration under Section 564(b)(1) of the Act, 21  U.S.C. section 360bbb-3(b)(1), unless the authorization is  terminated or revoked. Performed at [facility], [address] [place], [place] [address]   Culture, Urine     Status: Abnormal   Collection Time: 04/23/20 10:56 AM   Specimen: Urine, Random  Result Value Ref Range Status   Specimen Description   Final    URINE, RANDOM Performed at [facility], [address] [place], [place] [address]    Special Requests   Final    NONE Performed at [facility], [address] [place], [place] [address]    Culture >=100,000 COLONIES/mL ENTEROCOCCUS FAECALIS (A)  Final   Report Status 04/25/2020 FINAL  Final   Organism ID, Bacteria ENTEROCOCCUS FAECALIS (A)  Final      Susceptibility   Enterococcus faecalis - MIC*    AMPICILLIN <=2 SENSITIVE Sensitive     NITROFURANTOIN <=16 SENSITIVE Sensitive     VANCOMYCIN 2 SENSITIVE Sensitive     * >=100,000 COLONIES/mL ENTEROCOCCUS FAECALIS         Radiology Studies: No results found.      Scheduled Meds: . amLODipine  5 mg Oral Daily  .  ampicillin  500 mg Oral Q6H  . apixaban  2.5 mg Oral BID  . Chlorhexidine Gluconate Cloth  6 each Topical Daily  . cholecalciferol  1,000 Units Oral QPC supper  . docusate sodium  100 mg Oral BID  . feeding supplement (GLUCERNA SHAKE)  237 mL Oral TID BM  . gabapentin  300 mg Oral BID  . insulin aspart  0-5 Units Subcutaneous QHS  . insulin aspart  0-9 Units Subcutaneous TID WC  . insulin glargine  8 Units Subcutaneous Daily  . irbesartan  75 mg Oral Daily  . latanoprost  1 drop Both Eyes QHS  . multivitamin-lutein  1 capsule Oral BID  . senna  1 tablet Oral BID  . tamsulosin  0.4 mg Oral QPC supper  . vitamin B-12  500 mcg Oral Daily   Continuous Infusions:    LOS: 6 days    Time spent: 25 minutes    Tresa Moore, MD [facility] Pager [phone]  If 7PM-7AM, please contact night-coverage 04/27/2020, 2:16 PM

## 2020-04-28 DIAGNOSIS — S72001A Fracture of unspecified part of neck of right femur, initial encounter for closed fracture: Secondary | ICD-10-CM | POA: Diagnosis not present

## 2020-04-28 DIAGNOSIS — E119 Type 2 diabetes mellitus without complications: Secondary | ICD-10-CM | POA: Diagnosis not present

## 2020-04-28 DIAGNOSIS — I482 Chronic atrial fibrillation, unspecified: Secondary | ICD-10-CM | POA: Diagnosis not present

## 2020-04-28 DIAGNOSIS — W19XXXA Unspecified fall, initial encounter: Secondary | ICD-10-CM | POA: Diagnosis not present

## 2020-04-28 LAB — BASIC METABOLIC PANEL
Anion gap: 8 (ref 5–15)
BUN: 13 mg/dL (ref 8–23)
CO2: 25 mmol/L (ref 22–32)
Calcium: 8.7 mg/dL — ABNORMAL LOW (ref 8.9–10.3)
Chloride: 104 mmol/L (ref 98–111)
Creatinine, Ser: 0.4 mg/dL — ABNORMAL LOW (ref 0.44–1.00)
GFR, Estimated: >60 mL/min (ref >60)
Glucose, Bld: 196 mg/dL — ABNORMAL HIGH (ref 70–99)
Potassium: 3.7 mmol/L (ref 3.5–5.1)
Sodium: 137 mmol/L (ref 135–145)

## 2020-04-28 LAB — GLUCOSE, CAPILLARY
Glucose-Capillary: 241 mg/dL — ABNORMAL HIGH (ref 70–99)
Glucose-Capillary: 319 mg/dL — ABNORMAL HIGH (ref 70–99)
Glucose-Capillary: 228 mg/dL — ABNORMAL HIGH (ref 70–99)
Glucose-Capillary: 224 mg/dL — ABNORMAL HIGH (ref 70–99)

## 2020-04-28 LAB — CBC
HCT: 25.4 % — ABNORMAL LOW (ref 36.0–46.0)
Hemoglobin: 8.5 g/dL — ABNORMAL LOW (ref 12.0–15.0)
MCH: 30.4 pg (ref 26.0–34.0)
MCHC: 33.5 g/dL (ref 30.0–36.0)
MCV: 90.7 fL (ref 80.0–100.0)
Platelets: 193 10*3/uL (ref 150–400)
RBC: 2.8 MIL/uL — ABNORMAL LOW (ref 3.87–5.11)
RDW: 13 % (ref 11.5–15.5)
WBC: 7.6 10*3/uL (ref 4.0–10.5)
nRBC: 0.3 % — ABNORMAL HIGH (ref 0.0–0.2)

## 2020-04-28 MED ORDER — INSULIN GLARGINE 100 UNIT/ML ~~LOC~~ SOLN
12.0000 [IU] | Freq: Every day | SUBCUTANEOUS | Status: DC
  Administered 2020-04-29 – 2020-05-01 (×3): 12 [IU] via SUBCUTANEOUS
  Filled 2020-04-28 (×4): qty 0.12

## 2020-04-28 MED ORDER — BISACODYL 10 MG RE SUPP
10.0000 mg | Freq: Every day | RECTAL | Status: DC
  Administered 2020-04-28: 10 mg via RECTAL
  Filled 2020-04-28: qty 1

## 2020-04-28 NOTE — Progress Notes (Signed)
Physical Therapy Treatment Patient Details Name: Suzanne Cantu MRN: [id number] DOB: 1927-02-08 Today's Date: 04/28/2020    History of Present Illness 86 year old female history of A. fib on Eliquis, CVA, seizure disorder who presents with a mechanical fall and right hip pain.  No loss of consciousness.  On admission x-ray shows severely comminuted and displaced intertrochanteric fracture right proximal femur. S/p IM nail of R hip on 04/24/20    PT Comments    Pt presents in fowler's position on arrival to room and is agreeable to PT session. Pt performs supine exercises before mobility. Pt is modA for supine to sit as she is able to use her arms to help sit herself up but needed assistance with trunk and LEs. Once sitting EOB, pt is modA to scoot her hips forward to so her feet can touch the floor. Pt performed 2 rounds of sit<>stands with maxA with BUE support on RW. Pt attempted to slide feet however unable to perform even with maxA weight shifts. The second transfer, pt able to stand for about 1 minute before needing to sit down due to fatigue. Pt performs sitting exercises in between standing attempts. Pt also performed stand pivot to [facility] with maxA+1. She was able to push of with BUE support on commode but unable to put take steps during transfer. Pt performed 3 rounds of scooting up towards the HOB while sitting EOB. Pt is modA+2 for sit to supine and max+2 to scoot her up higher in bed. VCs needed for proper posture and hand placement throughout all mobility. Pt encouraged to continue performing her exercises and keep working hard with PT as progress and results takes time. Monitored vitals throughout session and listed below. Patient is left with all needs with daughter and nursing in room. Pt continues to benefit from skilled PT services to maximize return to PLOF and minimize risk of future falls, injury, caregiver burden, and readmission. Will continue to follow POC. Discharge  recommendation remains appropriate.  Vitals: Supine: O2: 96% RA, HR: 105 Sitting EOB after standing: O2: 90%, after a couple of minutes increased to 96%, HR: 120   Follow Up Recommendations  SNF     Equipment Recommendations  None recommended by PT    Recommendations for Other Services       Precautions / Restrictions Precautions Precautions: Fall Restrictions Weight Bearing Restrictions: Yes RLE Weight Bearing: Weight bearing as tolerated    Mobility  Bed Mobility Overal bed mobility: Needs Assistance Bed Mobility: Supine to Sit;Sit to Supine     Supine to sit: Mod assist Sit to supine: +2 for physical assistance;Mod assist   General bed mobility comments: Pt able to sit EOB and lay supine with assistance with trunk and BLE. VCs needed for proper hand placement throughout mobility. She was max+2 to scoot her up higher in bed  Transfers Overall transfer level: Needs assistance Equipment used: Rolling walker (2 wheeled) Transfers: Sit to/from [employer] Sit to Stand: Max assist;From elevated surface Stand pivot transfers: Max assist       General transfer comment: Pt was maxA with RW for weight shifting standing. Pt cued to stand tall for upright posture. Pt stood twice with RW. Pt also performed stand pivot to [facility] with maxA+1. Pt able to use BUE support on commode to help stand up.   Ambulation/Gait             General Gait Details: Attempted to take steps but pt unable to slide her foot  Stairs             Wheelchair Mobility    Modified Rankin (Stroke Patients Only)       Balance Overall balance assessment: Needs assistance Sitting-balance support: Feet supported Sitting balance-Leahy Scale: Fair Sitting balance - Comments: Pt able to maintain balance once feet are supported   Standing balance support: Bilateral upper extremity supported Standing balance-Leahy Scale: Poor Standing balance comment: MAX A for weight  shifting and BUE support on RW                            Cognition Arousal/Alertness: Awake/alert Behavior During Therapy: WFL for tasks assessed/performed Overall Cognitive Status: Within Functional Limits for tasks assessed                                        Exercises General Exercises - Lower Extremity Ankle Circles/Pumps: AROM;Both;10 reps;Supine Long Arc Quad: AROM;Right;10 reps;Seated Hip ABduction/ADduction: AAROM;Right;10 reps;Supine Other Exercises Other Exercises: Pt performed 3 rounds of scooting up towards the HOB while sitting EOB with modA+1. Pt uses BUE support and LLE to help scoot Other Exercises: Pt performs stand pivots to transfer to [facility] and back    General Comments        Pertinent Vitals/Pain Pain Assessment: Faces Faces Pain Scale: Hurts little more Pain Location: R hip Pain Descriptors / Indicators: Guarding;Operative site guarding Pain Intervention(s): Limited activity within patient's tolerance;Monitored during session;Repositioned    Home Living                      Prior Function            PT Goals (current goals can now be found in the care plan section) Acute Rehab PT Goals Patient Stated Goal: feel better PT Goal Formulation: With patient/family Time For Goal Achievement: 05/09/20 Potential to Achieve Goals: Good Progress towards PT goals: Progressing toward goals    Frequency    7X/week      PT Plan Current plan remains appropriate    Co-evaluation              AM-PAC PT "6 Clicks" Mobility   Outcome Measure  Help needed turning from your back to your side while in a flat bed without using bedrails?: A Lot Help needed moving from lying on your back to sitting on the side of a flat bed without using bedrails?: A Lot Help needed moving to and from a bed to a chair (including a wheelchair)?: A Lot Help needed standing up from a chair using your arms (e.g., wheelchair or bedside  chair)?: Total Help needed to walk in hospital room?: Total Help needed climbing 3-5 steps with a railing? : Total 6 Click Score: 9    End of Session Equipment Utilized During Treatment: Gait belt Activity Tolerance: Patient limited by fatigue;Patient limited by pain Patient left: in bed;with call bell/phone within reach;with bed alarm set;with nursing/sitter in room;with family/visitor present Nurse Communication: Mobility status PT Visit Diagnosis: Muscle weakness (generalized) (M62.81);Other abnormalities of gait and mobility (R26.89);Difficulty in walking, not elsewhere classified (R26.2);Pain Pain - Right/Left: Right Pain - part of body: Hip     Time: 5366-4403 PT Time Calculation (min) (ACUTE ONLY): 53 min  Charges:  Katherine Basset, SPT Baker Pierini 04/28/2020, 11:53 AM

## 2020-04-28 NOTE — Progress Notes (Addendum)
Inpatient Diabetes Program Recommendations  AACE/ADA: New Consensus Statement on Inpatient Glycemic Control (2015)  Target Ranges:  Prepandial:   less than 140 mg/dL      Peak postprandial:   less than 180 mg/dL (1-2 hours)      Critically ill patients:  140 - 180 mg/dL   Lab Results  Component Value Date   GLUCAP 319 (H) 04/28/2020   HGBA1C 7.3 (H) 04/21/2020    Review of Glycemic Control Results for Suzanne Cantu, Suzanne Cantu (MRN [id number]) as of 04/28/2020 11:30  Ref. Range 04/27/2020 16:12 04/27/2020 21:43 04/28/2020 07:42 04/28/2020 11:26  Glucose-Capillary Latest Ref Range: 70 - 99 mg/dL 147 (H) 829 (H) 562 (H) 319 (H)   Diabetes history: DM 2 Outpatient Diabetes medications:  Metformin 500 mg bid Current orders for Inpatient glycemic control:  Novolog sensitive tid with meals and HS Lantus 8 units daily  Inpatient Diabetes Program Recommendations:    May consider increasing Lantus to 12 units daily.  Also may benefit from the addition of Tradjenta 5 mg daily to help with post-prandial blood sugars? May need to d/c metformin from home meds due to reduced GFR?  Thanks  Beryl Meager, RN, BC-ADM Inpatient Diabetes Coordinator Pager [phone] (8a-5p)

## 2020-04-28 NOTE — Progress Notes (Signed)
  Subjective:  POD #4 s/p IM fixation for right IT hip fracture .   Patient reports right pain as mild to moderate.  Patient eating lunch.  Daughter at the bedside.  She states patient had a harder time with PT today.  Foley catheter still in.  Patient has not yet had a BM.    Objective:   VITALS:   Vitals:   04/27/20 0754 04/27/20 1604 04/28/20 0045 04/28/20 0742  BP: (!) 151/68 (!) 164/67 (!) 149/69 (!) 160/66  Pulse: 99 95 98 100  Resp: 18 18 15 20   Temp: 98 F (36.7 C) 97.9 F (36.6 C) (!) 97.5 F (36.4 C) 98 F (36.7 C)  TempSrc: Oral  Oral Oral  SpO2: 98% 96% 97% 92%  Weight:      Height:        PHYSICAL EXAM: Right lower extremity Neurovascular intact Sensation intact distally Intact pulses distally Dorsiflexion/Plantar flexion intact Incision: moderate drainage No cellulitis present Compartment soft  LABS  Results for orders placed or performed during the hospital encounter of 04/21/20 (from the past 24 hour(s))  Glucose, capillary     Status: Abnormal   Collection Time: 04/27/20  4:12 PM  Result Value Ref Range   Glucose-Capillary 247 (H) 70 - 99 mg/dL  Glucose, capillary     Status: Abnormal   Collection Time: 04/27/20  9:43 PM  Result Value Ref Range   Glucose-Capillary 264 (H) 70 - 99 mg/dL  Basic metabolic panel     Status: Abnormal   Collection Time: 04/28/20  4:40 AM  Result Value Ref Range   Sodium 137 135 - 145 mmol/L   Potassium 3.7 3.5 - 5.1 mmol/L   Chloride 104 98 - 111 mmol/L   CO2 25 22 - 32 mmol/L   Glucose, Bld 196 (H) 70 - 99 mg/dL   BUN 13 8 - 23 mg/dL   Creatinine, Ser 04/30/20 (L) 0.44 - 1.00 mg/dL   Calcium 8.7 (L) 8.9 - 10.3 mg/dL   GFR, Estimated 4.82 >70 mL/min   Anion gap 8 5 - 15  Glucose, capillary     Status: Abnormal   Collection Time: 04/28/20  7:42 AM  Result Value Ref Range   Glucose-Capillary 241 (H) 70 - 99 mg/dL  Glucose, capillary     Status: Abnormal   Collection Time: 04/28/20 11:26 AM  Result Value Ref Range    Glucose-Capillary 319 (H) 70 - 99 mg/dL    No results found.  Assessment/Plan: 4 Days Post-Op   Principal Problem:   Closed right hip fracture (HCC) Active Problems:   Essential hypertension   Controlled type 2 diabetes mellitus without complication, without long-term current use of insulin (HCC)   History of CVA (cerebrovascular accident)   Fall   Atrial fibrillation, chronic (HCC)   Protein-calorie malnutrition, severe  Continue PT as tolerated.   D/C foley today Pt on eliquis Change dressing. Check CBC    04/30/20 , MD 04/28/2020, 1:13 PM

## 2020-04-28 NOTE — Progress Notes (Signed)
PROGRESS NOTE    Suzanne Cantu  NLZ:[id number] DOB: 30-May-1927 DOA: 04/21/2020 PCP: Corky Downs, MD    Chief Complaint  Patient presents with  . Fall  . Hip Pain    Brief Narrative:  86 year old female history of A. fib on Eliquis, CVA, seizure disorder who presents with a mechanical fall and right hip pain.  No loss of consciousness.  On admission x-ray shows severely comminuted and displaced intertrochanteric fracture right proximal femur.  Orthopedics consulted from admission.  Plan for operative fixation during this admission however patient is on Eliquis and we need to wait 3 days prior to administering spinal anesthesia.  Per orthopedics surgery scheduled for Saturday, 04/24/2020  Status post OR with orthopedics on 04/24/2020. Postoperative course been complicated by intermittent delirium and excessive lethargy  Subjective: She is awake and interactive today Daughter at bedside PT in room   Assessment & Plan:   Principal Problem:   Closed right hip fracture [facility]) Active Problems:   Essential hypertension   Controlled type 2 diabetes mellitus without complication, without long-term current use of insulin (HCC)   History of CVA (cerebrovascular accident)   Fall   Atrial fibrillation, chronic (HCC)   Protein-calorie malnutrition, severe   Closed right hip fracture (HCC) Postoperative urinary retention Postoperative lethargy Severe community fracture seen on x-ray.  Severe pain on admission.  Now on multimodal regimen with better control. Orthopedic surgeon, Dr. Tawana Scale consulted.  Status post intramedullary fixation of right intertrochanteric hip fracture on 04/24/2020 -Family deciding skilled nursing facility versus home  Postop urinary retention, required straight caths x3 -She is more mobile today, will do voiding trial once moving bowels  Constipation, not relieved by MiraLAX, requests suppository  Controlled type 2 diabetes mellitus without  complication, without long-term current use of insulin (HCC)  Recent A1c 6.5, well controlled.  Patient taking Metformin at home -Sliding scale insulin -Hold Metformin  Chronic A fib: -Eliquis restarted -As needed metoprolol for sustained tachycardia  History of CVA (cerebrovascular accident) -Eliquis restarted  DVT prophylaxis: apixaban (ELIQUIS) tablet 2.5 mg Start: 04/25/20 1000 SCDs Start: 04/24/20 1157 Place TED hose Start: 04/24/20 1157 apixaban (ELIQUIS) tablet 2.5 mg   Code Status: DNR Family Communication: Daughter at bedside Disposition:   Status is: Inpatient  Dispo: The patient is from: Home              Anticipated d/c is to: Home versus skilled nursing facility              Anticipated d/c date is: Likely tomorrow              Patient currently not ready to discharge  Consultants:   Orthopedic  Procedures:  Status post intramedullary fixation of right intertrochanteric hip fracture on 04/24/2020  Antimicrobials:   None     Objective: Vitals:   04/27/20 1604 04/28/20 0045 04/28/20 0742 04/28/20 1525  BP: (!) 164/67 (!) 149/69 (!) 160/66 (!) 151/60  Pulse: 95 98 100 (!) 101  Resp: 18 15 20 20   Temp: 97.9 F (36.6 C) (!) 97.5 F (36.4 C) 98 F (36.7 C) 98.4 F (36.9 C)  TempSrc:  Oral Oral   SpO2: 96% 97% 92% 96%  Weight:      Height:        Intake/Output Summary (Last 24 hours) at 04/28/2020 1855 Last data filed at 04/28/2020 1328 Gross per 24 hour  Intake 0 ml  Output 3175 ml  Net -3175 ml   Filed Weights   04/21/20  0919  Weight: 50.8 kg    Examination:  General exam: Frail, but alert and interactive Respiratory system: Clear to auscultation. Respiratory effort normal. Cardiovascular system: S1 & S2 heard, RRR.  No pedal edema. Gastrointestinal system: Abdomen is nondistended, soft and nontender.  Normal bowel sounds heard. Central nervous system: Alert and oriented. No focal neurological deficits. Extremities: Very weak,  postop changes right hip Skin: No rashes, lesions or ulcers Psychiatry: Judgement and insight appear normal. Mood & affect appropriate.     Data Reviewed: I have personally reviewed following labs and imaging studies  CBC: Recent Labs  Lab 04/22/20 0627 04/24/20 0329 04/25/20 0436 04/26/20 0400 04/28/20 1342  WBC 13.4* 8.5 8.6 7.5 7.6  NEUTROABS  --  6.5 6.5 5.6  --   HGB 12.0 11.0* 9.8* 8.4* 8.5*  HCT 34.8* 32.0* 28.3* 24.9* 25.4*  MCV 89.2 89.6 89.8 91.9 90.7  PLT 195 126* 160 124* 193    Basic Metabolic Panel: Recent Labs  Lab 04/24/20 0329 04/25/20 0436 04/26/20 0400 04/27/20 0644 04/28/20 0440  NA 133* 136 138 136 137  K 4.1 4.3 4.1 3.6 3.7  CL 101 105 109 105 104  CO2 25 24 24 24 25   GLUCOSE 216* 230* 235* 211* 196*  BUN 10 16 18 17 13   CREATININE 0.53 0.67 0.58 0.54 0.40*  CALCIUM 8.5* 8.6* 8.5* 8.2* 8.7*    GFR: Estimated Creatinine Clearance: 36.2 mL/min (A) (by C-G formula based on SCr of 0.4 mg/dL (L)).  Liver Function Tests: No results for input(s): AST, ALT, ALKPHOS, BILITOT, PROT, ALBUMIN in the last 168 hours.  CBG: Recent Labs  Lab 04/27/20 1612 04/27/20 2143 04/28/20 0742 04/28/20 1126 04/28/20 1640  GLUCAP 247* 264* 241* 319* 228*     Recent Results (from the past 240 hour(s))  Respiratory Panel by RT PCR (Flu A&B, Covid) - Nasopharyngeal Swab     Status: None   Collection Time: 04/21/20 10:43 AM   Specimen: Nasopharyngeal Swab  Result Value Ref Range Status   SARS Coronavirus 2 by RT PCR NEGATIVE NEGATIVE Final    Comment: (NOTE) SARS-CoV-2 target nucleic acids are NOT DETECTED.  The SARS-CoV-2 RNA is generally detectable in upper respiratoy specimens during the acute phase of infection. The lowest concentration of SARS-CoV-2 viral copies this assay can detect is 131 copies/mL. A negative result does not preclude SARS-Cov-2 infection and should not be used as the sole basis for treatment or other patient management  decisions. A negative result may occur with  improper specimen collection/handling, submission of specimen other than nasopharyngeal swab, presence of viral mutation(s) within the areas targeted by this assay, and inadequate number of viral copies (<131 copies/mL). A negative result must be combined with clinical observations, patient history, and epidemiological information. The expected result is Negative.  Fact Sheet for Patients:  04/30/20  Fact Sheet for Healthcare Providers:  04/23/20  This test is no t yet approved or cleared by the https://www.moore.com/ FDA and  has been authorized for detection and/or diagnosis of SARS-CoV-2 by FDA under an Emergency Use Authorization (EUA). This EUA will remain  in effect (meaning this test can be used) for the duration of the COVID-19 declaration under Section 564(b)(1) of the Act, 21 U.S.C. section 360bbb-3(b)(1), unless the authorization is terminated or revoked sooner.     Influenza A by PCR NEGATIVE NEGATIVE Final   Influenza B by PCR NEGATIVE NEGATIVE Final    Comment: (NOTE) The Xpert Xpress SARS-CoV-2/FLU/RSV assay is intended as an  aid in  the diagnosis of influenza from Nasopharyngeal swab specimens and  should not be used as a sole basis for treatment. Nasal washings and  aspirates are unacceptable for Xpert Xpress SARS-CoV-2/FLU/RSV  testing.  Fact Sheet for Patients: https://www.moore.com/  Fact Sheet for Healthcare Providers: https://www.young.biz/  This test is not yet approved or cleared by the [place] FDA and  has been authorized for detection and/or diagnosis of SARS-CoV-2 by  FDA under an Emergency Use Authorization (EUA). This EUA will remain  in effect (meaning this test can be used) for the duration of the  Covid-19 declaration under Section 564(b)(1) of the Act, 21  U.S.C. section 360bbb-3(b)(1), unless the  authorization is  terminated or revoked. Performed at [facility], [address] [place], [place] [address]   Culture, Urine     Status: Abnormal   Collection Time: 04/23/20 10:56 AM   Specimen: Urine, Random  Result Value Ref Range Status   Specimen Description   Final    URINE, RANDOM Performed at [facility], [address] [place], [place] [address]    Special Requests   Final    NONE Performed at [facility], [address] [place], [place] [address]    Culture >=100,000 COLONIES/mL ENTEROCOCCUS FAECALIS (A)  Final   Report Status 04/25/2020 FINAL  Final   Organism ID, Bacteria ENTEROCOCCUS FAECALIS (A)  Final      Susceptibility   Enterococcus faecalis - MIC*    AMPICILLIN <=2 SENSITIVE Sensitive     NITROFURANTOIN <=16 SENSITIVE Sensitive     VANCOMYCIN 2 SENSITIVE Sensitive     * >=100,000 COLONIES/mL ENTEROCOCCUS FAECALIS         Radiology Studies: No results found.      Scheduled Meds: . amLODipine  5 mg Oral Daily  . ampicillin  500 mg Oral Q6H  . apixaban  2.5 mg Oral BID  . bisacodyl  10 mg Rectal Daily  . Chlorhexidine Gluconate Cloth  6 each Topical Daily  . cholecalciferol  1,000 Units Oral QPC supper  . docusate sodium  100 mg Oral BID  . feeding supplement (GLUCERNA SHAKE)  237 mL Oral TID BM  . gabapentin  300 mg Oral BID  . insulin aspart  0-5 Units Subcutaneous QHS  . insulin aspart  0-9 Units Subcutaneous TID WC  . insulin glargine  8 Units Subcutaneous Daily  . irbesartan  75 mg Oral Daily  . latanoprost  1 drop Both Eyes QHS  . multivitamin-lutein  1 capsule Oral BID  . senna  1 tablet Oral BID  . tamsulosin  0.4 mg Oral QPC supper  . vitamin B-12  500 mcg Oral Daily   Continuous Infusions:   LOS: 7 days   Time spent:25 mins Greater than 50% of this time was spent in counseling, explanation of diagnosis, planning of further management, and coordination of care.  I have personally reviewed  and interpreted on  04/28/2020 daily labs, I reviewed all nursing notes, pharmacy notes, consultant notes,  vitals, pertinent old records  I have discussed plan of care as described above with RN , patient and family on 04/28/2020  Voice Recognition /Dragon dictation system was used to create this note, attempts have been made to correct errors. Please contact the author with questions and/or clarifications.   Albertine Grates, MD PhD FACP [facility]  Available via Epic secure chat 7am-7pm for nonurgent issues Please page for urgent issues To page the attending provider between  7A-7P or the covering provider during after hours 7P-7A, please log into the web site [email or web address] and access using universal [place] password for that web site. If you do not have the password, please call the hospital operator.    04/28/2020, 6:55 PM

## 2020-04-28 NOTE — Plan of Care (Signed)

## 2020-04-29 ENCOUNTER — Encounter: Payer: Self-pay | Admitting: Orthopedic Surgery

## 2020-04-29 DIAGNOSIS — S72001A Fracture of unspecified part of neck of right femur, initial encounter for closed fracture: Secondary | ICD-10-CM | POA: Diagnosis not present

## 2020-04-29 LAB — GLUCOSE, CAPILLARY
Glucose-Capillary: 235 mg/dL — ABNORMAL HIGH (ref 70–99)
Glucose-Capillary: 189 mg/dL — ABNORMAL HIGH (ref 70–99)
Glucose-Capillary: 176 mg/dL — ABNORMAL HIGH (ref 70–99)
Glucose-Capillary: 274 mg/dL — ABNORMAL HIGH (ref 70–99)

## 2020-04-29 NOTE — Progress Notes (Signed)
  Subjective:  POD #5 s/p intramedullary fixation for right intertrochanteric hip fracture..   Patient reports right hip pain as mild.  Daughter is at the bedside.  Patient has been sleeping quite a bit today per the daughter.  Objective:   VITALS:   Vitals:   04/28/20 1525 04/28/20 2350 04/29/20 0756 04/29/20 1612  BP: (!) 151/60 (!) 152/61 (!) 152/80 (!) 116/50  Pulse: (!) 101 (!) 108 (!) 102 88  Resp: 20 16 18 17   Temp: 98.4 F (36.9 C) (!) 97.1 F (36.2 C) 98 F (36.7 C) 97.8 F (36.6 C)  TempSrc:   Oral   SpO2: 96% 97% 92% 96%  Weight:      Height:        PHYSICAL EXAM: Right lower extremity Neurovascular intact Sensation intact distally Intact pulses distally Dorsiflexion/Plantar flexion intact Incision: scant drainage No cellulitis present Compartment soft  LABS  Results for orders placed or performed during the hospital encounter of 04/21/20 (from the past 24 hour(s))  Glucose, capillary     Status: Abnormal   Collection Time: 04/28/20  9:15 PM  Result Value Ref Range   Glucose-Capillary 224 (H) 70 - 99 mg/dL  Glucose, capillary     Status: Abnormal   Collection Time: 04/29/20  7:58 AM  Result Value Ref Range   Glucose-Capillary 235 (H) 70 - 99 mg/dL   Comment 1 Notify RN   Glucose, capillary     Status: Abnormal   Collection Time: 04/29/20 12:12 PM  Result Value Ref Range   Glucose-Capillary 189 (H) 70 - 99 mg/dL   Comment 1 Notify RN     No results found.  Assessment/Plan: 5 Days Post-Op   Principal Problem:   Closed right hip fracture (HCC) Active Problems:   Essential hypertension   Controlled type 2 diabetes mellitus without complication, without long-term current use of insulin (HCC)   History of CVA (cerebrovascular accident)   Fall   Atrial fibrillation, chronic (HCC)   Protein-calorie malnutrition, severe  Continue with physical therapy.  Patient on Eliquis.  Continue current pain management.   05/01/20 , MD 04/29/2020,  4:59 PM

## 2020-04-29 NOTE — Plan of Care (Signed)

## 2020-04-29 NOTE — Progress Notes (Signed)
Occupational Therapy Treatment Patient Details Name: Suzanne Cantu MRN: [id number] DOB: 1927-05-01 Today's Date: 04/29/2020    History of present illness 86 year old female history of A. fib on Eliquis, CVA, seizure disorder who presents with a mechanical fall and right hip pain.  No loss of consciousness.  On admission x-ray shows severely comminuted and displaced intertrochanteric fracture right proximal femur. S/p IM nail of R hip on 04/24/20   OT comments  Pt seen for OT tx. Pt very fatigued, dtr present. Pt lethargic/fatigued. Caregiver education/training provided to pt's daughter including AE/DME, bed mobility techniques, falls prevention, dementia care strategies. Dtr verbalized understanding. Dtr reports strong desire to take pt back home for care versus going to SNF, particularly given cognitive impairments at baseline. Encouragement and active listening provided. Dtr very appreciative of instruction and encouragement provided. Continues to benefit from skilled OT services; continue to recommend SNF At this time unless family able to provide needed level of assist at home to maximize safety.    Follow Up Recommendations  SNF    Equipment Recommendations  3 in 1 bedside commode    Recommendations for Other Services      Precautions / Restrictions Precautions Precautions: Fall Restrictions Weight Bearing Restrictions: Yes RLE Weight Bearing: Weight bearing as tolerated       Mobility Bed Mobility                  Transfers                      Balance                                           ADL either performed or assessed with clinical judgement   ADL                                               Vision       Perception     Praxis      Cognition Arousal/Alertness: Lethargic                                              Exercises Other Exercises Other Exercises: Pt  lethargic/fatigued. Caregiver education/training provided to pt's daughter including AE/DME, bed mobility techniques, falls prevention, dementia care strategies; dtr very appreciative   Shoulder Instructions       General Comments      Pertinent Vitals/ Pain          Home Living                                          Prior Functioning/Environment              Frequency  Min 2X/week        Progress Toward Goals  OT Goals(current goals can now be found in the care plan section)  Progress towards OT goals: Progressing toward goals  Acute Rehab OT Goals Patient Stated Goal: feel better OT Goal Formulation: With patient/family Time For Goal Achievement:  05/09/20 Potential to Achieve Goals: Good  Plan Discharge plan remains appropriate;Frequency needs to be updated    Co-evaluation                 AM-PAC OT "6 Clicks" Daily Activity     Outcome Measure   Help from another person eating meals?: A Little Help from another person taking care of personal grooming?: A Little Help from another person toileting, which includes using toliet, bedpan, or urinal?: A Lot Help from another person bathing (including washing, rinsing, drying)?: A Lot Help from another person to put on and taking off regular upper body clothing?: A Little Help from another person to put on and taking off regular lower body clothing?: A Lot 6 Click Score: 15    End of Session Equipment Utilized During Treatment: Gait belt;Rolling walker  OT Visit Diagnosis: Other abnormalities of gait and mobility (R26.89);Muscle weakness (generalized) (M62.81)   Activity Tolerance Patient limited by fatigue;Patient limited by lethargy   Patient Left in bed;with call bell/phone within reach;with bed alarm set;with family/visitor present   Nurse Communication          Time: 3709-6438 OT Time Calculation (min): 27 min  Charges: OT General Charges $OT Visit: 1 Visit OT  Treatments $Self Care/Home Management : 23-37 mins  Richrd Prime, MPH, MS, OTR/L ascom [phone] 04/29/20, 2:08 PM

## 2020-04-29 NOTE — Progress Notes (Signed)
[facility] Liaison note: Patient is currently followed by [employer] community Palliative program at home. TOC Misty Green made aware. Will follow for discharge disposition. Dayna Barker BSN, RN, [facility] [employer] [phone]

## 2020-04-29 NOTE — Care Management Important Message (Signed)
Important Message  Patient Details  Name: Suzanne Cantu MRN: [id number] Date of Birth: Jan 23, 1927   Medicare Important Message Given:  Yes     [name] A Santiaga Butzin 04/29/2020, 11:04 AM

## 2020-04-29 NOTE — Progress Notes (Signed)
Nutrition Follow-up  DOCUMENTATION CODES:   Severe malnutrition in context of social or environmental circumstances  INTERVENTION:  Continue Glucerna Shake po TID, each supplement provides 220 kcal and 10 grams of protein.  Provide Magic cup BID with lunch and dinner, each supplement provides 290 kcal and 9 grams of protein. Patient prefers vanilla or chocolate.  NUTRITION DIAGNOSIS:   Severe Malnutrition related to social / environmental circumstances (hx of weight loss when caring for husband, advanced age) as evidenced by severe fat depletion, moderate muscle depletion, severe muscle depletion.  Ongoing.  GOAL:   Patient will meet greater than or equal to 90% of their needs  Met with calories, progressing with protein.  MONITOR:   PO intake, Supplement acceptance, Labs, Weight trends, I & O's  REASON FOR ASSESSMENT:   Consult Assessment of nutrition requirement/status  ASSESSMENT:   86 year old female with PMHx of HTN, DM, hx CVA, A-fib admitted after mechanical fall with closed right hip fracture.   10/23 s/p IM fixation of right intertrochanteric hip fracture  Met with patient and her daughter at bedside. Patient was sleeping (daughter reports she had a busy morning). Daughter reports patient is eating as best she can at each meal but is eating small amounts. Yesterday for lunch patient had some of her chicken and then 1/2 tomato sandwich. For dinner patient had bites of her spaghetti and macaroni and cheese. For breakfast she had eggs, bacon, and toast. She is also drinking milk and fluids well. Daughter reports she is drinking Glucerna TID. In the past 24 hours patient has had approximately 1281 kcal (91.5% minimum estimated needs) and 65 grams of protein (87% minimum estimated needs). Daughter reports patient would probably enjoy chocolate or vanilla Magic Cup so RD will send with trays.  Medications reviewed and include: vitamin D3 1000 units daily, Colace 100 mg BID,  gabapentin, Novolog 0-5 units QHS, Novolog 0-9 units TID, Lantus 12 units daily, Ocuvite BID, senna 1 tablet BID, vitamin B12 500 micrograms daily.  Labs reviewed: CBG 189-235.  Diet Order:   Diet Order            Diet regular Room service appropriate? Yes; Fluid consistency: Thin  Diet effective now                EDUCATION NEEDS:   No education needs have been identified at this time  Skin:  Skin Assessment: Reviewed RN Assessment  Last BM:  Unknown  Height:   Ht Readings from Last 1 Encounters:  04/21/20 5' 2" (1.575 m)   Weight:   Wt Readings from Last 1 Encounters:  04/21/20 50.8 kg   BMI:  Body mass index is 20.49 kg/m.  Estimated Nutritional Needs:   Kcal:  1400-1600  Protein:  75-85 grams  Fluid:  1.2-1.5 L/day   King, MS, RD, LDN Pager number available on Amion 

## 2020-04-29 NOTE — Progress Notes (Signed)
Physical Therapy Treatment Patient Details Name: Suzanne Cantu MRN: [id number] DOB: 1926/12/19 Today's Date: 04/29/2020    History of Present Illness 86 year old female history of A. fib on Eliquis, CVA, seizure disorder who presents with a mechanical fall and right hip pain.  No loss of consciousness.  On admission x-ray shows severely comminuted and displaced intertrochanteric fracture right proximal femur. S/p IM nail of R hip on 04/24/20    PT Comments    Pt presents in fowler's position on arrival to room and is agreeable to PT session. During this session, daughter educated and demonstrates proper transfer technique and assistance as the +2 for caregiver training. Pt is modA for sit to supine transfer but needed assistance with LE and trunk. Once sitting, pt requires modA to move closer to EOB so her feet can touch the floor. Pt stood twice this session, both times requiring maxA+2 with RW to stand as she was unsteady during transition despite having BUE supported on RW. VCs required for proper posture, however patient fatigues quickly and only able to stand for 30 seconds both times. Pt sits EOB for 15 minutes while external pertubtations given to pt during hair brushing, bathing, and gown change with help from daughter. Pt cued to squeeze shoulder blades and sit with good posture to activate core strength. Patient is maxA+2 for sit to supine transition. She was maxA+2 to scoot her up higher in bed and modA+2 for rolling to change bed linens and don brief. VCs needed for proper hand placement throughout mobility. Pt left in bed with all needs and daughter bedside. Pt continues to benefit from skilled PT services to maximize return to PLOF and minimize risk of future falls, injury, caregiver burden, and readmission. Will continue to follow POC. Continue to recommend SNF at this time unless family able to provide needed level of assist at home to maximize safety.    Follow Up  Recommendations  SNF;Supervision/Assistance - 24 hour;Other (comment) (24 hour assistance x2 people)     Equipment Recommendations  Other (comment) (Sliding board)    Recommendations for Other Services       Precautions / Restrictions Precautions Precautions: Fall Restrictions Weight Bearing Restrictions: Yes RLE Weight Bearing: Weight bearing as tolerated    Mobility  Bed Mobility Overal bed mobility: Needs Assistance Bed Mobility: Supine to Sit;Sit to Supine;Rolling Rolling: +2 for physical assistance;Mod assist   Supine to sit: Mod assist Sit to supine: +2 for physical assistance;Max assist   General bed mobility comments: Pt able to sit EOB and lay supine with assistance with trunk and BLE. She was max+2 to scoot her up higher in bed, modA+2 for rolling to change bed linens and don brief. VCs needed for proper hand placement throughout mobility.   Transfers Overall transfer level: Needs assistance Equipment used: Rolling walker (2 wheeled) Transfers: Sit to/from Stand Sit to Stand: Max assist;+2 physical assistance         General transfer comment: Pt performs two sit<>stand transfers with maxA+2 with RW to stand, pt unsteady during transition even with BUE support on RW  Ambulation/Gait             General Gait Details: Pt did not attempt as she was fatigued   Optometrist    Modified Rankin (Stroke Patients Only)       Balance Overall balance assessment: Needs assistance Sitting-balance support: Feet supported Sitting balance-Leahy Scale: Fair Sitting balance -  Comments: Pt able to maintain balance once feet are supported. Pt demonstrated posterior lean and slouched posture as she fatigued. Pt encouraged to maintain posture with VCs Postural control: Posterior lean Standing balance support: Bilateral upper extremity supported Standing balance-Leahy Scale: Poor Standing balance comment: Pt needed modA+1 assistance  for balance as she was unsteady with BUE support on RW.                             Cognition Arousal/Alertness: Awake/alert Behavior During Therapy: WFL for tasks assessed/performed Overall Cognitive Status: Within Functional Limits for tasks assessed                                        Exercises Other Exercises Other Exercises: Pt lethargic/fatigued. Caregiver education/training provided to pt's daughter including AE/DME, bed mobility techniques, falls prevention, dementia care strategies; dtr very appreciative Other Exercises: Pt sat EOB for 15 minutes while external pertubtations given to pt with hair brushing, bathing, and gown change with help from daughter. Pt cued to continue sitting with proper posture Other Exercises: Daughter educated/demonstrated how to perform all transfers during session as she worked with PT as the +2    General Comments        Pertinent Vitals/Pain Pain Assessment: Faces Faces Pain Scale: Hurts little more Pain Location: R hip Pain Descriptors / Indicators: Guarding;Operative site guarding Pain Intervention(s): Limited activity within patient's tolerance;Monitored during session;Premedicated before session    Home Living                      Prior Function            PT Goals (current goals can now be found in the care plan section) Acute Rehab PT Goals Patient Stated Goal: feel better PT Goal Formulation: With patient/family Time For Goal Achievement: 05/09/20 Potential to Achieve Goals: Good Progress towards PT goals: Progressing toward goals    Frequency    7X/week      PT Plan Discharge plan needs to be updated    Co-evaluation              AM-PAC PT "6 Clicks" Mobility   Outcome Measure  Help needed turning from your back to your side while in a flat bed without using bedrails?: A Lot Help needed moving from lying on your back to sitting on the side of a flat bed without using  bedrails?: A Lot Help needed moving to and from a bed to a chair (including a wheelchair)?: A Lot Help needed standing up from a chair using your arms (e.g., wheelchair or bedside chair)?: Total Help needed to walk in hospital room?: Total Help needed climbing 3-5 steps with a railing? : Total 6 Click Score: 9    End of Session Equipment Utilized During Treatment: Gait belt Activity Tolerance: Patient limited by fatigue;Patient limited by pain Patient left: in bed;with bed alarm set;with call bell/phone within reach;with family/visitor present Nurse Communication: Mobility status PT Visit Diagnosis: Muscle weakness (generalized) (M62.81);Other abnormalities of gait and mobility (R26.89);Difficulty in walking, not elsewhere classified (R26.2);Pain Pain - Right/Left: Right Pain - part of body: Hip     Time: 1601-0932 PT Time Calculation (min) (ACUTE ONLY): 56 min  Charges:  Katherine Basset, SPT   Baker Pierini 04/29/2020, 2:30 PM

## 2020-04-29 NOTE — Progress Notes (Signed)
PROGRESS NOTE    Suzanne Cantu  HDI:[id number] DOB: [birth year] DOA: 04/21/2020 PCP: Corky Downs, MD    Chief Complaint  Patient presents with  . Fall  . Hip Pain    Brief Narrative:  86 year old female history of A. fib on Eliquis, CVA, seizure disorder who presents with a mechanical fall and right hip pain.  No loss of consciousness.  On admission x-ray shows severely comminuted and displaced intertrochanteric fracture right proximal femur.  Orthopedics consulted from admission.  Plan for operative fixation during this admission however patient is on Eliquis and we need to wait 3 days prior to administering spinal anesthesia.  Per orthopedics surgery scheduled for Saturday, 04/24/2020  Status post OR with orthopedics on 04/24/2020. Postoperative course been complicated by intermittent delirium and excessive lethargy  Subjective: Postop day 5 , she remained very weak , but currently denies pain  She has however poor oral intake  she is awake and interactive today, appears at baseline dementia Daughter at bedside   Assessment & Plan:   Principal Problem:   Closed right hip fracture (HCC) Active Problems:   Essential hypertension   Controlled type 2 diabetes mellitus without complication, without long-term current use of insulin (HCC)   History of CVA (cerebrovascular accident)   Fall   Atrial fibrillation, chronic (HCC)   Protein-calorie malnutrition, severe   Closed right hip fracture (HCC) Postoperative urinary retention Postoperative lethargy Severe community fracture seen on x-ray.  Severe pain on admission.  Now on multimodal regimen with better control. Orthopedic surgeon, Dr. Tawana Scale consulted.  Status post intramedullary fixation of right intertrochanteric hip fracture on 04/24/2020 -PT recommended SNF, Family deciding skilled nursing facility versus home  Postop urinary retention, required straight caths x3, then indwelling foley -voiding trial  was successful  Constipation, appear resolved  Controlled type 2 diabetes mellitus without complication, without long-term current use of insulin (HCC)  Recent A1c 6.5, well controlled.  Patient taking Metformin at home -Sliding scale insulin -Hold Metformin  Chronic A fib: -Eliquis restarted -As needed metoprolol for sustained tachycardia  History of CVA (cerebrovascular accident)/vascular dementia, baseline not oriented to time -Eliquis restarted  FTT; family deciding on skilled nursing facility versus home  patient is followed by community palliative care at home  DVT prophylaxis: apixaban (ELIQUIS) tablet 2.5 mg Start: 04/25/20 1000 SCDs Start: 04/24/20 1157 Place TED hose Start: 04/24/20 1157 apixaban (ELIQUIS) tablet 2.5 mg   Code Status: DNR Family Communication: Daughter at bedside Disposition:   Status is: Inpatient  Dispo: The patient is from: Home              Anticipated d/c is to: Home versus skilled nursing facility              Anticipated d/c date is: Likely tomorrow once there is safe discharge planning                Consultants:   Orthopedic  Procedures:  Status post intramedullary fixation of right intertrochanteric hip fracture on 04/24/2020  Antimicrobials:   None     Objective: Vitals:   04/28/20 1525 04/28/20 2350 04/29/20 0756 04/29/20 1612  BP: (!) 151/60 (!) 152/61 (!) 152/80 (!) 116/50  Pulse: (!) 101 (!) 108 (!) 102 88  Resp: 20 16 18 17   Temp: 98.4 F (36.9 C) (!) 97.1 F (36.2 C) 98 F (36.7 C) 97.8 F (36.6 C)  TempSrc:   Oral   SpO2: 96% 97% 92% 96%  Weight:  Height:        Intake/Output Summary (Last 24 hours) at 04/29/2020 1831 Last data filed at 04/29/2020 0900 Gross per 24 hour  Intake 0 ml  Output --  Net 0 ml   Filed Weights   04/21/20 0919  Weight: 50.8 kg    Examination:  General exam: Frail, but alert and interactive Respiratory system: Clear to auscultation. Respiratory effort  normal. Cardiovascular system: S1 & S2 heard, RRR.  No pedal edema. Gastrointestinal system: Abdomen is nondistended, soft and nontender.  Normal bowel sounds heard. Central nervous system: Alert and oriented. No focal neurological deficits. Extremities: Very weak, postop changes right hip Skin: No rashes, lesions or ulcers Psychiatry: Judgement and insight appear normal. Mood & affect appropriate.     Data Reviewed: I have personally reviewed following labs and imaging studies  CBC: Recent Labs  Lab 04/24/20 0329 04/25/20 0436 04/26/20 0400 04/28/20 1342  WBC 8.5 8.6 7.5 7.6  NEUTROABS 6.5 6.5 5.6  --   HGB 11.0* 9.8* 8.4* 8.5*  HCT 32.0* 28.3* 24.9* 25.4*  MCV 89.6 89.8 91.9 90.7  PLT 126* 160 124* 193    Basic Metabolic Panel: Recent Labs  Lab 04/24/20 0329 04/25/20 0436 04/26/20 0400 04/27/20 0644 04/28/20 0440  NA 133* 136 138 136 137  K 4.1 4.3 4.1 3.6 3.7  CL 101 105 109 105 104  CO2 25 24 24 24 25   GLUCOSE 216* 230* 235* 211* 196*  BUN 10 16 18 17 13   CREATININE 0.53 0.67 0.58 0.54 0.40*  CALCIUM 8.5* 8.6* 8.5* 8.2* 8.7*    GFR: Estimated Creatinine Clearance: 36.2 mL/min (A) (by C-G formula based on SCr of 0.4 mg/dL (L)).  Liver Function Tests: No results for input(s): AST, ALT, ALKPHOS, BILITOT, PROT, ALBUMIN in the last 168 hours.  CBG: Recent Labs  Lab 04/28/20 1640 04/28/20 2115 04/29/20 0758 04/29/20 1212 04/29/20 1714  GLUCAP 228* 224* 235* 189* 176*     Recent Results (from the past 240 hour(s))  Respiratory Panel by RT PCR (Flu A&B, Covid) - Nasopharyngeal Swab     Status: None   Collection Time: 04/21/20 10:43 AM   Specimen: Nasopharyngeal Swab  Result Value Ref Range Status   SARS Coronavirus 2 by RT PCR NEGATIVE NEGATIVE Final    Comment: (NOTE) SARS-CoV-2 target nucleic acids are NOT DETECTED.  The SARS-CoV-2 RNA is generally detectable in upper respiratoy specimens during the acute phase of infection. The  lowest concentration of SARS-CoV-2 viral copies this assay can detect is 131 copies/mL. A negative result does not preclude SARS-Cov-2 infection and should not be used as the sole basis for treatment or other patient management decisions. A negative result may occur with  improper specimen collection/handling, submission of specimen other than nasopharyngeal swab, presence of viral mutation(s) within the areas targeted by this assay, and inadequate number of viral copies (<131 copies/mL). A negative result must be combined with clinical observations, patient history, and epidemiological information. The expected result is Negative.  Fact Sheet for Patients:  https://www.moore.com/https://www.fda.gov/media/142436/download  Fact Sheet for Healthcare Providers:  https://www.young.biz/https://www.fda.gov/media/142435/download  This test is no t yet approved or cleared by the [place] FDA and  has been authorized for detection and/or diagnosis of SARS-CoV-2 by FDA under an Emergency Use Authorization (EUA). This EUA will remain  in effect (meaning this test can be used) for the duration of the COVID-19 declaration under Section 564(b)(1) of the Act, 21 U.S.C. section 360bbb-3(b)(1), unless the authorization is terminated or revoked sooner.  Influenza A by PCR NEGATIVE NEGATIVE Final   Influenza B by PCR NEGATIVE NEGATIVE Final    Comment: (NOTE) The Xpert Xpress SARS-CoV-2/FLU/RSV assay is intended as an aid in  the diagnosis of influenza from Nasopharyngeal swab specimens and  should not be used as a sole basis for treatment. Nasal washings and  aspirates are unacceptable for Xpert Xpress SARS-CoV-2/FLU/RSV  testing.  Fact Sheet for Patients: https://www.moore.com/  Fact Sheet for Healthcare Providers: https://www.young.biz/  This test is not yet approved or cleared by the [place] FDA and  has been authorized for detection and/or diagnosis of SARS-CoV-2 by  FDA under  an Emergency Use Authorization (EUA). This EUA will remain  in effect (meaning this test can be used) for the duration of the  Covid-19 declaration under Section 564(b)(1) of the Act, 21  U.S.C. section 360bbb-3(b)(1), unless the authorization is  terminated or revoked. Performed at [facility], [address] [place], [place] [address]   Culture, Urine     Status: Abnormal   Collection Time: 04/23/20 10:56 AM   Specimen: Urine, Random  Result Value Ref Range Status   Specimen Description   Final    URINE, RANDOM Performed at [facility], [address], [place], [place] [address]    Special Requests   Final    NONE Performed at [facility], [address] [place], [place] [address]    Culture >=100,000 COLONIES/mL ENTEROCOCCUS FAECALIS (A)  Final   Report Status 04/25/2020 FINAL  Final   Organism ID, Bacteria ENTEROCOCCUS FAECALIS (A)  Final      Susceptibility   Enterococcus faecalis - MIC*    AMPICILLIN <=2 SENSITIVE Sensitive     NITROFURANTOIN <=16 SENSITIVE Sensitive     VANCOMYCIN 2 SENSITIVE Sensitive     * >=100,000 COLONIES/mL ENTEROCOCCUS FAECALIS         Radiology Studies: No results found.      Scheduled Meds: . amLODipine  5 mg Oral Daily  . ampicillin  500 mg Oral Q6H  . apixaban  2.5 mg Oral BID  . bisacodyl  10 mg Rectal Daily  . cholecalciferol  1,000 Units Oral QPC supper  . docusate sodium  100 mg Oral BID  . feeding supplement (GLUCERNA SHAKE)  237 mL Oral TID BM  . gabapentin  300 mg Oral BID  . insulin aspart  0-5 Units Subcutaneous QHS  . insulin aspart  0-9 Units Subcutaneous TID WC  . insulin glargine  12 Units Subcutaneous Daily  . irbesartan  75 mg Oral Daily  . latanoprost  1 drop Both Eyes QHS  . multivitamin-lutein  1 capsule Oral BID  . senna  1 tablet Oral BID  . tamsulosin  0.4 mg Oral QPC supper  . vitamin B-12  500 mcg Oral Daily   Continuous Infusions:   LOS: 8 days   Time spent:15  mins Greater than 50% of this time was spent in counseling, explanation of diagnosis, planning of further management, and coordination of care.  I have personally reviewed and interpreted on  04/29/2020 daily labs, I reviewed all nursing notes, pharmacy notes, consultant notes,  vitals, pertinent old records  I have discussed plan of care as described above with RN , patient and family on 04/29/2020  Voice Recognition /Dragon dictation system was used to create this note, attempts have been made to correct errors. Please contact the author with questions and/or clarifications.   Albertine Grates, MD PhD FACP [facility]  Available via Epic secure chat 7am-7pm for nonurgent issues Please page for urgent issues To page the attending provider between 7A-7P or the covering provider during after hours 7P-7A, please log into the web site [email or web address] and access using universal [place] password for that web site. If you do not have the password, please call the hospital operator.    04/29/2020, 6:31 PM

## 2020-04-30 DIAGNOSIS — S72001A Fracture of unspecified part of neck of right femur, initial encounter for closed fracture: Secondary | ICD-10-CM | POA: Diagnosis not present

## 2020-04-30 LAB — GLUCOSE, CAPILLARY
Glucose-Capillary: 211 mg/dL — ABNORMAL HIGH (ref 70–99)
Glucose-Capillary: 253 mg/dL — ABNORMAL HIGH (ref 70–99)
Glucose-Capillary: 231 mg/dL — ABNORMAL HIGH (ref 70–99)
Glucose-Capillary: 180 mg/dL — ABNORMAL HIGH (ref 70–99)

## 2020-04-30 MED ORDER — MAGIC MOUTHWASH
5.0000 mL | Freq: Three times a day (TID) | ORAL | Status: DC | PRN
  Administered 2020-04-30 – 2020-05-01 (×2): 5 mL via ORAL
  Filled 2020-04-30 (×3): qty 10

## 2020-04-30 NOTE — Progress Notes (Addendum)
  Subjective:  POD #6 s/p IM fixation for right IT hip fracture.   Patient reports right hip pain as mild to moderate.  Patient working with PT today.  Daughter at the bedside.  Objective:   VITALS:   Vitals:   04/29/20 0756 04/29/20 1612 04/29/20 2347 04/30/20 0738  BP: (!) 152/80 (!) 116/50 132/60 134/71  Pulse: (!) 102 88 95 91  Resp: 18 17 18 15   Temp: 98 F (36.7 C) 97.8 F (36.6 C) 98.4 F (36.9 C) 98.4 F (36.9 C)  TempSrc: Oral  Oral Oral  SpO2: 92% 96% 95% 95%  Weight:      Height:        PHYSICAL EXAM: Right lower extremity Neurovascular intact Sensation intact distally Intact pulses distally Dorsiflexion/Plantar flexion intact Incision: moderate drainage No cellulitis present Compartment soft  LABS  Results for orders placed or performed during the hospital encounter of 04/21/20 (from the past 24 hour(s))  Glucose, capillary     Status: Abnormal   Collection Time: 04/29/20 12:12 PM  Result Value Ref Range   Glucose-Capillary 189 (H) 70 - 99 mg/dL   Comment 1 Notify RN   Glucose, capillary     Status: Abnormal   Collection Time: 04/29/20  5:14 PM  Result Value Ref Range   Glucose-Capillary 176 (H) 70 - 99 mg/dL  Glucose, capillary     Status: Abnormal   Collection Time: 04/29/20  8:51 PM  Result Value Ref Range   Glucose-Capillary 274 (H) 70 - 99 mg/dL  Glucose, capillary     Status: Abnormal   Collection Time: 04/30/20  7:39 AM  Result Value Ref Range   Glucose-Capillary 211 (H) 70 - 99 mg/dL   Comment 1 Notify RN    Comment 2 Document in Chart   Glucose, capillary     Status: Abnormal   Collection Time: 04/30/20 11:29 AM  Result Value Ref Range   Glucose-Capillary 253 (H) 70 - 99 mg/dL   Comment 1 Notify RN    Comment 2 Document in Chart     No results found.  Assessment/Plan: 6 Days Post-Op   Principal Problem:   Closed right hip fracture (HCC) Active Problems:   Essential hypertension   Controlled type 2 diabetes mellitus without  complication, without long-term current use of insulin (HCC)   History of CVA (cerebrovascular accident)   Fall   Atrial fibrillation, chronic (HCC)   Protein-calorie malnutrition, severe  Patient will continue PT.  She is WBAT on right lower extremity. Discharge plan pending whether SNF or home with services.  Patient would benefit from SNF, but family considering taking her home due to concerns about need for quarantine at [facility].  On eliquis.  Follow up in 10-14 days post-op in the office.     03-28-1977 , MD 04/30/2020, 11:42 AM

## 2020-04-30 NOTE — Plan of Care (Signed)
  Problem: Education: Goal: Knowledge of General Education information will improve Description: Including pain rating scale, medication(s)/side effects and non-pharmacologic comfort measures Outcome: Progressing   Problem: Health Behavior/Discharge Planning: Goal: Ability to manage health-related needs will improve Outcome: Progressing   Problem: Clinical Measurements: Goal: Ability to maintain clinical measurements within normal limits will improve Outcome: Progressing Goal: Will remain free from infection Outcome: Progressing Goal: Diagnostic test results will improve Outcome: Progressing Goal: Respiratory complications will improve Outcome: Progressing Goal: Cardiovascular complication will be avoided Outcome: Progressing   Problem: Activity: Goal: Risk for activity intolerance will decrease Outcome: Progressing   Problem: Nutrition: Goal: Adequate nutrition will be maintained Outcome: Progressing   Problem: Coping: Goal: Level of anxiety will decrease Outcome: Progressing   Problem: Elimination: Goal: Will not experience complications related to bowel motility Outcome: Progressing Goal: Will not experience complications related to urinary retention Outcome: Progressing   Problem: Pain Managment: Goal: General experience of comfort will improve Outcome: Progressing   Problem: Activity: Goal: Ability to avoid complications of mobility impairment will improve Outcome: Progressing Goal: Ability to tolerate increased activity will improve Outcome: Progressing   Problem: Clinical Measurements: Goal: Postoperative complications will be avoided or minimized Outcome: Progressing

## 2020-04-30 NOTE — Progress Notes (Signed)
PROGRESS NOTE    Suzanne Cantu  HBZ:[id number] DOB: [birth year] DOA: 04/21/2020 PCP: Corky Downs, MD    Chief Complaint  Patient presents with  . Fall  . Hip Pain    Brief Narrative:  86 year old female history of A. fib on Eliquis, CVA, seizure disorder who presents with a mechanical fall and right hip pain.  No loss of consciousness.  On admission x-ray shows severely comminuted and displaced intertrochanteric fracture right proximal femur.  Orthopedics consulted from admission.  Plan for operative fixation during this admission however patient is on Eliquis and we need to wait 3 days prior to administering spinal anesthesia.  Per orthopedics surgery scheduled for Saturday, 04/24/2020  Status post OR with orthopedics on 04/24/2020. Postoperative course been complicated by intermittent delirium and excessive lethargy  Subjective: Postop day 6 , she remaines very weak , but currently denies pain  She has however poor oral intake  she is awake and interactive today, appears at baseline dementia Reports last night got very confused and upset  Daughter at bedside   Assessment & Plan:   Principal Problem:   Closed right hip fracture (HCC) Active Problems:   Essential hypertension   Controlled type 2 diabetes mellitus without complication, without long-term current use of insulin (HCC)   History of CVA (cerebrovascular accident)   Fall   Atrial fibrillation, chronic (HCC)   Protein-calorie malnutrition, severe   Closed right hip fracture (HCC) Postoperative urinary retention Postoperative lethargy Severe community fracture seen on x-ray.  Severe pain on admission.  Now on multimodal regimen with better control. Orthopedic surgeon, Dr. Tawana Scale consulted.  Status post intramedullary fixation of right intertrochanteric hip fracture on 04/24/2020 -PT recommended SNF, Family deciding skilled nursing facility versus home  Postop urinary retention, required  straight caths x3, then indwelling foley -voiding trial was successful  Constipation, appear resolved  Controlled type 2 diabetes mellitus without complication, without long-term current use of insulin (HCC)  Recent A1c 6.5, well controlled.  Patient taking Metformin at home -Sliding scale insulin -Hold Metformin  Chronic A fib: -Eliquis restarted -As needed metoprolol for sustained tachycardia  History of CVA (cerebrovascular accident)/vascular dementia, baseline not oriented to time -Eliquis restarted  FTT; family deciding on skilled nursing facility versus home  patient is followed by community palliative care at home  DVT prophylaxis: apixaban (ELIQUIS) tablet 2.5 mg Start: 04/25/20 1000 SCDs Start: 04/24/20 1157 Place TED hose Start: 04/24/20 1157 apixaban (ELIQUIS) tablet 2.5 mg   Code Status: DNR Family Communication: Daughter at bedside Disposition:   Status is: Inpatient  Dispo: The patient is from: Home              Anticipated d/c is to: Home versus skilled nursing facility              Anticipated d/c date is: medically ready to discharge, awaiting safe discharge planning                Consultants:   Orthopedic  Procedures:  Status post intramedullary fixation of right intertrochanteric hip fracture on 04/24/2020  Antimicrobials:   None     Objective: Vitals:   04/29/20 0756 04/29/20 1612 04/29/20 2347 04/30/20 0738  BP: (!) 152/80 (!) 116/50 132/60 134/71  Pulse: (!) 102 88 95 91  Resp: 18 17 18 15   Temp: 98 F (36.7 C) 97.8 F (36.6 C) 98.4 F (36.9 C) 98.4 F (36.9 C)  TempSrc: Oral  Oral Oral  SpO2: 92% 96% 95% 95%  Weight:  Height:        Intake/Output Summary (Last 24 hours) at 04/30/2020 1106 Last data filed at 04/29/2020 1900 Gross per 24 hour  Intake 240 ml  Output --  Net 240 ml   Filed Weights   04/21/20 0919  Weight: 50.8 kg    Examination:  General exam: Frail, but alert and interactive Respiratory  system: Clear to auscultation. Respiratory effort normal. Cardiovascular system: S1 & S2 heard, RRR.  No pedal edema. Gastrointestinal system: Abdomen is nondistended, soft and nontender.  Normal bowel sounds heard. Central nervous system: Alert and oriented. No focal neurological deficits. Extremities: Very weak, postop changes right hip Skin: No rashes, lesions or ulcers Psychiatry: Judgement and insight appear normal. Mood & affect appropriate.     Data Reviewed: I have personally reviewed following labs and imaging studies  CBC: Recent Labs  Lab 04/24/20 0329 04/25/20 0436 04/26/20 0400 04/28/20 1342  WBC 8.5 8.6 7.5 7.6  NEUTROABS 6.5 6.5 5.6  --   HGB 11.0* 9.8* 8.4* 8.5*  HCT 32.0* 28.3* 24.9* 25.4*  MCV 89.6 89.8 91.9 90.7  PLT 126* 160 124* 193    Basic Metabolic Panel: Recent Labs  Lab 04/24/20 0329 04/25/20 0436 04/26/20 0400 04/27/20 0644 04/28/20 0440  NA 133* 136 138 136 137  K 4.1 4.3 4.1 3.6 3.7  CL 101 105 109 105 104  CO2 25 24 24 24 25   GLUCOSE 216* 230* 235* 211* 196*  BUN 10 16 18 17 13   CREATININE 0.53 0.67 0.58 0.54 0.40*  CALCIUM 8.5* 8.6* 8.5* 8.2* 8.7*    GFR: Estimated Creatinine Clearance: 36.2 mL/min (A) (by C-G formula based on SCr of 0.4 mg/dL (L)).  Liver Function Tests: No results for input(s): AST, ALT, ALKPHOS, BILITOT, PROT, ALBUMIN in the last 168 hours.  CBG: Recent Labs  Lab 04/29/20 0758 04/29/20 1212 04/29/20 1714 04/29/20 2051 04/30/20 0739  GLUCAP 235* 189* 176* 274* 211*     Recent Results (from the past 240 hour(s))  Respiratory Panel by RT PCR (Flu A&B, Covid) - Nasopharyngeal Swab     Status: None   Collection Time: 04/21/20 10:43 AM   Specimen: Nasopharyngeal Swab  Result Value Ref Range Status   SARS Coronavirus 2 by RT PCR NEGATIVE NEGATIVE Final    Comment: (NOTE) SARS-CoV-2 target nucleic acids are NOT DETECTED.  The SARS-CoV-2 RNA is generally detectable in upper respiratoy specimens during  the acute phase of infection. The lowest concentration of SARS-CoV-2 viral copies this assay can detect is 131 copies/mL. A negative result does not preclude SARS-Cov-2 infection and should not be used as the sole basis for treatment or other patient management decisions. A negative result may occur with  improper specimen collection/handling, submission of specimen other than nasopharyngeal swab, presence of viral mutation(s) within the areas targeted by this assay, and inadequate number of viral copies (<131 copies/mL). A negative result must be combined with clinical observations, patient history, and epidemiological information. The expected result is Negative.  Fact Sheet for Patients:  05/02/20  Fact Sheet for Healthcare Providers:  04/23/20  This test is no t yet approved or cleared by the https://www.moore.com/ FDA and  has been authorized for detection and/or diagnosis of SARS-CoV-2 by FDA under an Emergency Use Authorization (EUA). This EUA will remain  in effect (meaning this test can be used) for the duration of the COVID-19 declaration under Section 564(b)(1) of the Act, 21 U.S.C. section 360bbb-3(b)(1), unless the authorization is terminated or revoked sooner.  Influenza A by PCR NEGATIVE NEGATIVE Final   Influenza B by PCR NEGATIVE NEGATIVE Final    Comment: (NOTE) The Xpert Xpress SARS-CoV-2/FLU/RSV assay is intended as an aid in  the diagnosis of influenza from Nasopharyngeal swab specimens and  should not be used as a sole basis for treatment. Nasal washings and  aspirates are unacceptable for Xpert Xpress SARS-CoV-2/FLU/RSV  testing.  Fact Sheet for Patients: https://www.moore.com/https://www.fda.gov/media/142436/download  Fact Sheet for Healthcare Providers: https://www.young.biz/https://www.fda.gov/media/142435/download  This test is not yet approved or cleared by the [place] FDA and  has been authorized for detection and/or  diagnosis of SARS-CoV-2 by  FDA under an Emergency Use Authorization (EUA). This EUA will remain  in effect (meaning this test can be used) for the duration of the  Covid-19 declaration under Section 564(b)(1) of the Act, 21  U.S.C. section 360bbb-3(b)(1), unless the authorization is  terminated or revoked. Performed at [facility], [address] [place], [place] [address]   Culture, Urine     Status: Abnormal   Collection Time: 04/23/20 10:56 AM   Specimen: Urine, Random  Result Value Ref Range Status   Specimen Description   Final    URINE, RANDOM Performed at [facility], [address] [place], [place] [address]    Special Requests   Final    NONE Performed at [facility], [address] [place], [place] [address]    Culture >=100,000 COLONIES/mL ENTEROCOCCUS FAECALIS (A)  Final   Report Status 04/25/2020 FINAL  Final   Organism ID, Bacteria ENTEROCOCCUS FAECALIS (A)  Final      Susceptibility   Enterococcus faecalis - MIC*    AMPICILLIN <=2 SENSITIVE Sensitive     NITROFURANTOIN <=16 SENSITIVE Sensitive     VANCOMYCIN 2 SENSITIVE Sensitive     * >=100,000 COLONIES/mL ENTEROCOCCUS FAECALIS         Radiology Studies: No results found.      Scheduled Meds: . amLODipine  5 mg Oral Daily  . ampicillin  500 mg Oral Q6H  . apixaban  2.5 mg Oral BID  . bisacodyl  10 mg Rectal Daily  . cholecalciferol  1,000 Units Oral QPC supper  . docusate sodium  100 mg Oral BID  . feeding supplement (GLUCERNA SHAKE)  237 mL Oral TID BM  . gabapentin  300 mg Oral BID  . insulin aspart  0-5 Units Subcutaneous QHS  . insulin aspart  0-9 Units Subcutaneous TID WC  . insulin glargine  12 Units Subcutaneous Daily  . irbesartan  75 mg Oral Daily  . latanoprost  1 drop Both Eyes QHS  . multivitamin-lutein  1 capsule Oral BID  . senna  1 tablet Oral BID  . tamsulosin  0.4 mg Oral QPC supper  . vitamin B-12  500 mcg Oral Daily   Continuous  Infusions:   LOS: 9 days   Time spent:15 mins Greater than 50% of this time was spent in counseling, explanation of diagnosis, planning of further management, and coordination of care.  I have personally reviewed and interpreted on  04/30/2020 daily labs, I reviewed all nursing notes, pharmacy notes, consultant notes,  vitals, pertinent old records  I have discussed plan of care as described above with RN , patient and family on 04/30/2020  Voice Recognition /Dragon dictation system was used to create this note, attempts have been made to correct errors. Please contact the author with questions and/or clarifications.   Albertine GratesFang Aleane Wesenberg, MD PhD FACP [facility]  Available via Epic secure chat 7am-7pm for nonurgent issues Please page for urgent issues To page the attending provider between 7A-7P or the covering provider during after hours 7P-7A, please log into the web site [email or web address] and access using universal [place] password for that web site. If you do not have the password, please call the hospital operator.    04/30/2020, 11:06 AM

## 2020-04-30 NOTE — Plan of Care (Signed)
  Problem: Education: Goal: Knowledge of General Education information will improve Description: Including pain rating scale, medication(s)/side effects and non-pharmacologic comfort measures 04/30/2020 1737 by Jean Rosenthal, RN Outcome: Progressing 04/30/2020 0839 by Jean Rosenthal, RN Outcome: Progressing   Problem: Health Behavior/Discharge Planning: Goal: Ability to manage health-related needs will improve 04/30/2020 1737 by Jean Rosenthal, RN Outcome: Progressing 04/30/2020 0839 by Jean Rosenthal, RN Outcome: Progressing   Problem: Clinical Measurements: Goal: Ability to maintain clinical measurements within normal limits will improve 04/30/2020 1737 by Jean Rosenthal, RN Outcome: Progressing 04/30/2020 0839 by Jean Rosenthal, RN Outcome: Progressing Goal: Will remain free from infection 04/30/2020 1737 by Jean Rosenthal, RN Outcome: Progressing 04/30/2020 0839 by Jean Rosenthal, RN Outcome: Progressing Goal: Diagnostic test results will improve 04/30/2020 1737 by Jean Rosenthal, RN Outcome: Progressing 04/30/2020 0839 by Jean Rosenthal, RN Outcome: Progressing Goal: Respiratory complications will improve 04/30/2020 1737 by Jean Rosenthal, RN Outcome: Progressing 04/30/2020 0839 by Jean Rosenthal, RN Outcome: Progressing Goal: Cardiovascular complication will be avoided 04/30/2020 1737 by Jean Rosenthal, RN Outcome: Progressing 04/30/2020 0839 by Jean Rosenthal, RN Outcome: Progressing   Problem: Activity: Goal: Risk for activity intolerance will decrease 04/30/2020 1737 by Jean Rosenthal, RN Outcome: Progressing 04/30/2020 0839 by Jean Rosenthal, RN Outcome: Progressing   Problem: Nutrition: Goal: Adequate nutrition will be maintained 04/30/2020 1737 by Jean Rosenthal, RN Outcome: Progressing 04/30/2020 0839 by Jean Rosenthal, RN Outcome: Progressing   Problem: Coping: Goal: Level of anxiety will  decrease 04/30/2020 1737 by Jean Rosenthal, RN Outcome: Progressing 04/30/2020 0839 by Jean Rosenthal, RN Outcome: Progressing   Problem: Elimination: Goal: Will not experience complications related to bowel motility 04/30/2020 1737 by Jean Rosenthal, RN Outcome: Progressing 04/30/2020 0839 by Jean Rosenthal, RN Outcome: Progressing Goal: Will not experience complications related to urinary retention 04/30/2020 1737 by Jean Rosenthal, RN Outcome: Progressing 04/30/2020 0839 by Jean Rosenthal, RN Outcome: Progressing   Problem: Pain Managment: Goal: General experience of comfort will improve 04/30/2020 1737 by Jean Rosenthal, RN Outcome: Progressing 04/30/2020 0839 by Jean Rosenthal, RN Outcome: Progressing   Problem: Safety: Goal: Ability to remain free from injury will improve 04/30/2020 1737 by Jean Rosenthal, RN Outcome: Progressing 04/30/2020 0839 by Jean Rosenthal, RN Outcome: Progressing   Problem: Skin Integrity: Goal: Risk for impaired skin integrity will decrease 04/30/2020 1737 by Jean Rosenthal, RN Outcome: Progressing 04/30/2020 0839 by Jean Rosenthal, RN Outcome: Progressing   Problem: Activity: Goal: Ability to avoid complications of mobility impairment will improve 04/30/2020 1737 by Jean Rosenthal, RN Outcome: Progressing 04/30/2020 0839 by Jean Rosenthal, RN Outcome: Progressing Goal: Ability to tolerate increased activity will improve 04/30/2020 1737 by Jean Rosenthal, RN Outcome: Progressing 04/30/2020 0839 by Jean Rosenthal, RN Outcome: Progressing   Problem: Clinical Measurements: Goal: Postoperative complications will be avoided or minimized 04/30/2020 1737 by Jean Rosenthal, RN Outcome: Progressing 04/30/2020 0839 by Jean Rosenthal, RN Outcome: Progressing

## 2020-04-30 NOTE — Progress Notes (Signed)
Physical Therapy Treatment Patient Details Name: Suzanne Cantu MRN: [id number] DOB: [birth year] Today's Date: 04/30/2020    History of Present Illness 86 year old female history of A. fib on Eliquis, CVA, seizure disorder who presents with a mechanical fall and right hip pain.  No loss of consciousness.  On admission x-ray shows severely comminuted and displaced intertrochanteric fracture right proximal femur. S/p IM nail of R hip on 04/24/20    PT Comments    Pt presents in bed on arrival to room with daughter bedside and is agreeable to PT session. During this session, daughter educated and demonstrates proper transfer technique and assistance as the +2 for caregiver training. Pt continues to require maxA+2 to be positioned higher in bed, modA+ with bed mobility and moving closer to EOB so her feet can touch the floor. Pt stood twice this session utilizing RW with maxA+2 as she was unsteady during transition despite BUE support on RW. VCs required for proper posture, and pt able to stand longer than yesterday but continues to demonstrate decreased endurance. Pt performed totalA+1 stand pivot to sit in recliner chair and required maxA to scoot back in chair. VCs needed for proper hand placement throughout mobility.  Patient and daughter reports performing exercises this morning and encouraged to continue exercises throughout the day. Pt left in chair with all needs, daughter bedside and nursing in room. Ptcontinues to benefit from skilledPT services to maximize return to PLOF and minimize risk of future falls, injury, caregiver burden, and readmission. Will continue to follow POC. Continue to recommend SNF at this time unless family able to provide needed level of assist at home to maximize safety.   Follow Up Recommendations  SNF;Supervision/Assistance - 24 hour;Other (comment) (24 hour assistance x 2 people)     Equipment Recommendations  Other (comment) (Sliding board)     Recommendations for Other Services       Precautions / Restrictions Precautions Precautions: Fall Restrictions Weight Bearing Restrictions: Yes RLE Weight Bearing: Weight bearing as tolerated    Mobility  Bed Mobility Overal bed mobility: Needs Assistance Bed Mobility: Supine to Sit     Supine to sit: Mod assist     General bed mobility comments: Pt able to sit EOB with assistance with trunk and BLE. VCs needed for proper hand placement throughout mobility.   Transfers Overall transfer level: Needs assistance Equipment used: Rolling walker (2 wheeled) Transfers: Sit to/from [employer] Sit to Stand: Max assist;+2 physical assistance Stand pivot transfers: Total assist       General transfer comment: Pt performs two sit<>stand transfers with maxA+2 with RW to stand, pt unsteady during transition even with BUE support on RW. Pt was total assist for stand pivot to sit in chair.  Ambulation/Gait             General Gait Details: Pt attempted sliding foot forward while standing, but pt unable to perform   Stairs             Wheelchair Mobility    Modified Rankin (Stroke Patients Only)       Balance Overall balance assessment: Needs assistance Sitting-balance support: Feet supported Sitting balance-Leahy Scale: Fair Sitting balance - Comments: Pt able to maintain balance once feet are supported. Pt demonstrated posterior lean and slouched posture as she fatigued. Pt encouraged to maintain posture with VCs Postural control: Posterior lean Standing balance support: Bilateral upper extremity supported Standing balance-Leahy Scale: Poor Standing balance comment: Pt needed modA+1 assistance for balance as she  was unsteady with BUE support on RW.                             Cognition Arousal/Alertness: Awake/alert Behavior During Therapy: WFL for tasks assessed/performed Overall Cognitive Status: Within Functional Limits for  tasks assessed                                        Exercises Other Exercises Other Exercises: Seated scapular retractions x 10 Other Exercises: Daughter educated/demonstrated how to perform all transfers during session as she worked with PT as the +2    General Comments        Pertinent Vitals/Pain Pain Assessment: Faces Faces Pain Scale: Hurts a little bit (at rest; increases to 8 at the end) Pain Location: R hip Pain Descriptors / Indicators: Guarding;Operative site guarding Pain Intervention(s): Limited activity within patient's tolerance;Monitored during session;Repositioned    Home Living                      Prior Function            PT Goals (current goals can now be found in the care plan section) Acute Rehab PT Goals Patient Stated Goal: feel better PT Goal Formulation: With patient/family Time For Goal Achievement: 05/09/20 Potential to Achieve Goals: Good Progress towards PT goals: Progressing toward goals    Frequency    7X/week      PT Plan Current plan remains appropriate    Co-evaluation              AM-PAC PT "6 Clicks" Mobility   Outcome Measure  Help needed turning from your back to your side while in a flat bed without using bedrails?: A Lot Help needed moving from lying on your back to sitting on the side of a flat bed without using bedrails?: A Lot Help needed moving to and from a bed to a chair (including a wheelchair)?: A Lot Help needed standing up from a chair using your arms (e.g., wheelchair or bedside chair)?: Total Help needed to walk in hospital room?: Total Help needed climbing 3-5 steps with a railing? : Total 6 Click Score: 9    End of Session Equipment Utilized During Treatment: Gait belt Activity Tolerance: Patient limited by fatigue;Patient limited by pain Patient left: in chair;with call bell/phone within reach;with chair alarm set;with family/visitor present Nurse Communication:  Mobility status PT Visit Diagnosis: Muscle weakness (generalized) (M62.81);Other abnormalities of gait and mobility (R26.89);Difficulty in walking, not elsewhere classified (R26.2);Pain Pain - Right/Left: Right Pain - part of body: Hip     Time: 1116-1200 PT Time Calculation (min) (ACUTE ONLY): 44 min  Charges:                        Katherine Basset, SPT   Baker Pierini 04/30/2020, 12:37 PM

## 2020-05-01 DIAGNOSIS — S72001A Fracture of unspecified part of neck of right femur, initial encounter for closed fracture: Secondary | ICD-10-CM | POA: Diagnosis not present

## 2020-05-01 DIAGNOSIS — W19XXXA Unspecified fall, initial encounter: Secondary | ICD-10-CM | POA: Diagnosis not present

## 2020-05-01 DIAGNOSIS — E119 Type 2 diabetes mellitus without complications: Secondary | ICD-10-CM | POA: Diagnosis not present

## 2020-05-01 DIAGNOSIS — I482 Chronic atrial fibrillation, unspecified: Secondary | ICD-10-CM | POA: Diagnosis not present

## 2020-05-01 LAB — CBC
HCT: 28.2 % — ABNORMAL LOW (ref 36.0–46.0)
Hemoglobin: 9.4 g/dL — ABNORMAL LOW (ref 12.0–15.0)
MCH: 31.1 pg (ref 26.0–34.0)
MCHC: 33.3 g/dL (ref 30.0–36.0)
MCV: 93.4 fL (ref 80.0–100.0)
Platelets: 302 10*3/uL (ref 150–400)
RBC: 3.02 MIL/uL — ABNORMAL LOW (ref 3.87–5.11)
RDW: 13.5 % (ref 11.5–15.5)
WBC: 8.3 10*3/uL (ref 4.0–10.5)
nRBC: 0 % (ref 0.0–0.2)

## 2020-05-01 LAB — GLUCOSE, CAPILLARY
Glucose-Capillary: 190 mg/dL — ABNORMAL HIGH (ref 70–99)
Glucose-Capillary: 271 mg/dL — ABNORMAL HIGH (ref 70–99)
Glucose-Capillary: 275 mg/dL — ABNORMAL HIGH (ref 70–99)

## 2020-05-01 MED ORDER — AMOXICILLIN 250 MG PO CAPS
250.0000 mg | ORAL_CAPSULE | Freq: Three times a day (TID) | ORAL | Status: DC
  Administered 2020-05-01: 250 mg via ORAL
  Filled 2020-05-01 (×3): qty 1

## 2020-05-01 MED ORDER — AMLODIPINE BESYLATE 5 MG PO TABS
5.0000 mg | ORAL_TABLET | Freq: Every day | ORAL | Status: DC
Start: 2020-05-02 — End: 2021-01-14

## 2020-05-01 MED ORDER — IBUPROFEN 400 MG PO TABS: 400.0000 mg | ORAL_TABLET | Freq: Four times a day (QID) | ORAL | 0 refills | Status: DC | PRN

## 2020-05-01 MED ORDER — SENNOSIDES-DOCUSATE SODIUM 8.6-50 MG PO TABS: 1.0000 | ORAL_TABLET | Freq: Every day | ORAL | Status: AC

## 2020-05-01 MED ORDER — SENNOSIDES-DOCUSATE SODIUM 8.6-50 MG PO TABS: 1.0000 | ORAL_TABLET | Freq: Every day | ORAL | Status: DC

## 2020-05-01 MED ORDER — INSULIN GLARGINE 100 UNIT/ML ~~LOC~~ SOLN
10.0000 [IU] | Freq: Every day | SUBCUTANEOUS | 0 refills | Status: DC
Start: 2020-05-02 — End: 2020-10-26

## 2020-05-01 MED ORDER — ACETAMINOPHEN 325 MG PO TABS
325.0000 mg | ORAL_TABLET | Freq: Four times a day (QID) | ORAL | Status: AC | PRN
Start: 2020-05-01 — End: ?

## 2020-05-01 MED ORDER — TAMSULOSIN HCL 0.4 MG PO CAPS: 0.4000 mg | ORAL_CAPSULE | Freq: Every day | ORAL | Status: AC

## 2020-05-01 MED ORDER — MAGIC MOUTHWASH: 5.0000 mL | Freq: Three times a day (TID) | ORAL | 0 refills | Status: DC | PRN

## 2020-05-01 MED ORDER — GLUCERNA SHAKE PO LIQD: 237.0000 mL | Freq: Three times a day (TID) | ORAL | 0 refills | Status: AC

## 2020-05-01 MED ORDER — AMOXICILLIN 250 MG PO CAPS: 250.0000 mg | ORAL_CAPSULE | Freq: Three times a day (TID) | ORAL | Status: DC

## 2020-05-01 MED ORDER — ALPRAZOLAM 0.25 MG PO TABS: 0.1250 mg | ORAL_TABLET | Freq: Every evening | ORAL | Status: DC | PRN

## 2020-05-01 MED ORDER — GABAPENTIN 300 MG PO CAPS: 300.0000 mg | ORAL_CAPSULE | Freq: Two times a day (BID) | ORAL | Status: DC

## 2020-05-01 MED ORDER — INSULIN ASPART 100 UNIT/ML ~~LOC~~ SOLN
SUBCUTANEOUS | 0 refills | Status: DC
Start: 2020-05-01 — End: 2020-10-26

## 2020-05-01 MED ORDER — ALPRAZOLAM 0.25 MG PO TABS
0.1250 mg | ORAL_TABLET | Freq: Every evening | ORAL | 0 refills | Status: DC | PRN
Start: 2020-05-01 — End: 2020-05-05

## 2020-05-01 NOTE — Discharge Summary (Signed)
Discharge Summary  Suzanne Cantu VOZ:[id number] DOB: Sep 09, 1926  PCP: Corky Downs, MD  Admit date: 04/21/2020 Discharge date: 05/01/2020  Time spent: , more than 50% time spent on coordination of care.   Recommendations for Outpatient Follow-up:  1. F/u with SNF MD within a week  for hospital discharge follow up, repeat cbc/bmp at follow up 2. Follow-up with orthopedic surgery  Discharge Diagnoses:  Active Hospital Problems   Diagnosis Date Noted  . Closed right hip fracture (HCC) 04/21/2020  . Protein-calorie malnutrition, severe 04/23/2020  . Fall 04/21/2020  . Atrial fibrillation, chronic (HCC) 04/21/2020  . History of CVA (cerebrovascular accident) 11/07/2019  . Essential hypertension 07/31/2019  . Controlled type 2 diabetes mellitus without complication, without long-term current use of insulin (HCC) 07/31/2019    Resolved Hospital Problems  No resolved problems to display.    Discharge Condition: stable  Diet recommendation: heart healthy/carb modified, aspiration precaution, pills need to be crushed, speech therapy to follow at skilled nursing facility  [facility]   04/21/20 0919  Weight: 50.8 kg    History of present illness: (Per admitting MD Dr Clyde Lundborg) ) PCP: Corky Downs, MD   Patient coming from:  The patient is coming from home.  At baseline, pt is partially dependent for most of ADL.        Chief Complaint: fall and right hip pain.  HPI: Suzanne Cantu is a 86 y.o. female with medical history significant of hypertension, diabetes mellitus, stroke, seizure, atrial fibrillation on Eliquis, who presents with a fall and right heel pain.  Patient states that she fell while she was putting on her bathrobe and lost her balance. She fell onto her right hip.  She does not think she hit her head. No LOC. She developed pain in the right hip, which is sharp, severe, nonradiating, aggravated by movement.  No unilateral numbness or tingling in  extremities, no facial droop or slurred speech.  Patient does not have chest pain, cough, shortness of breath.  No nausea, vomiting, diarrhea or abdominal pain.  No symptoms of UTI.  Her last dose of Eliquis was at 8:45 PM yesterday.  Hospital Course:  Principal Problem:   Closed right hip fracture [facility]) Active Problems:   Essential hypertension   Controlled type 2 diabetes mellitus without complication, without long-term current use of insulin (HCC)   History of CVA (cerebrovascular accident)   Fall   Atrial fibrillation, chronic (HCC)   Protein-calorie malnutrition, severe  Closed right hip fracture (HCC) Postoperative urinary retention Postoperative lethargy Severe community fracture seen on x-ray. Severe pain on admission. Now on multimodal regimen with better control. Orthopedic surgeon, Dr. Tawana Scale consulted.  Status post intramedullary fixation of right intertrochanteric hip fracture on 04/24/2020 -SNF placement and follow-up with Dr. Martha Clan in 2 weeks, weight bearing as tolerated, reinforce dressing as needed  Postop urinary retention, required straight caths x3, then indwelling foley -voiding trial was successful, increase activity, prevent constipation, continue Flomax for another 7 days Urine culture was sent on October 22, positive for Enterococcus faecalis, pansensitive, will give amoxicillin for 5 days  Constipation, appear resolved, continue Senokot nightly, hold if diarrhea  Controlled type 2 diabetes mellitus without complication, without long-term current use of insulin (HCC) Recent A1c 6.5, well controlled.  Patient taking Metformin at home -Sliding scale insulin, she required long acting insulin in the hospital due to hyperglycemia, she was discharged on long-acting insulin and sliding scale -Suspect hypoglycemia due to stress related to hip fracture, expect improvement,  do not expect long-term insulin in this 86 year old elderly female. -Resume  Metformin  Chronic A fib: -Eliquis restarted -Rate controlled  History of CVA (cerebrovascular accident)/vascular dementia, baseline not oriented to time -Eliquis restarted -Family report not taking statin due to side effect  FTT; family deciding on skilled nursing facility versus home  patient is followed by community palliative care at home   Code Status: DNR Family Communication: Daughter at bedside  Disposition:   Status is: Inpatient  Dispo: The patient is from: Home  Anticipated d/c is to: Home versus skilled nursing facility  Anticipated d/c date is: medically ready to discharge, awaiting safe discharge planning    Consultants:   Orthopedic  Procedures:  Status post intramedullary fixation of right intertrochanteric hip fracture on 04/24/2020  Antimicrobials:   Amoxicillin   Discharge Exam: BP 137/65 (BP Location: Right Arm)   Pulse 84   Temp 97.9 F (36.6 C)   Resp 17   Ht 5\' 2"  (1.575 m)   Wt 50.8 kg   SpO2 96%   BMI 20.49 kg/m   General: Pleasantly confused elderly female, not in acute distress Cardiovascular: RRR Respiratory: Normal respiratory effort  Discharge Instructions You were cared for by a hospitalist during your hospital stay. If you have any questions about your discharge medications or the care you received while you were in the hospital after you are discharged, you can call the unit and asked to speak with the hospitalist on call if the hospitalist that took care of you is not available. Once you are discharged, your primary care physician will handle any further medical issues. Please note that NO REFILLS for any discharge medications will be authorized once you are discharged, as it is imperative that you return to your primary care physician (or establish a relationship with a primary care physician if you do not have one) for your aftercare needs so that they can reassess your need for  medications and monitor your lab values.  Discharge Instructions    Diet - low sodium heart healthy   Complete by: As directed    Carb modified diet   Discharge wound care:   Complete by: As directed    Reinforce dressing, follow up with orthpedics   Increase activity slowly   Complete by: As directed      Allergies as of 05/01/2020      Reactions   Valproic Acid And Related Other (See Comments)   Low blood pressure, weakness.   Metaxalone Other (See Comments)   "It made me not feel right"   Peanut-containing Drug Products Other (See Comments)   Raises blood sugar      Medication List    STOP taking these medications   aspirin 325 MG EC tablet   atorvastatin 40 MG tablet Commonly known as: LIPITOR   clopidogrel 75 MG tablet Commonly known as: PLAVIX   docusate sodium 250 MG capsule Commonly known as: COLACE     TAKE these medications   acetaminophen 325 MG tablet Commonly known as: TYLENOL Take 1-2 tablets (325-650 mg total) by mouth every 6 (six) hours as needed for mild pain (pain score 1-3 or temp > 100.5).   ALPRAZolam 0.25 MG tablet Commonly known as: XANAX Take 0.5 tablets (0.125 mg total) by mouth at bedtime as needed for anxiety.   amLODipine 5 MG tablet Commonly known as: NORVASC Take 1 tablet (5 mg total) by mouth daily. Start taking on: May 02, 2020   amoxicillin 250 MG capsule Commonly  known as: AMOXIL Take 1 capsule (250 mg total) by mouth every 8 (eight) hours for 5 days.   apixaban 2.5 MG Tabs tablet Commonly known as: Eliquis Take 1 tablet (2.5 mg total) by mouth 2 (two) times daily.   feeding supplement (GLUCERNA SHAKE) Liqd Take 237 mLs by mouth 3 (three) times daily between meals.   gabapentin 300 MG capsule Commonly known as: NEURONTIN Take 1 capsule (300 mg total) by mouth 2 (two) times daily.   ibuprofen 400 MG tablet Commonly known as: ADVIL Take 1 tablet (400 mg total) by mouth every 6 (six) hours as needed for mild pain  or moderate pain.   insulin aspart 100 UNIT/ML injection Commonly known as: novoLOG Before each meal 3 times a day, 140-199 - 2 units, 200-250 - 4 units, 251-299 - 6 units,  300-349 - 8 units,  350 or above 10 units. Insulin PEN if approved, provide syringes and needles if needed.   insulin glargine 100 UNIT/ML injection Commonly known as: LANTUS Inject 0.1 mLs (10 Units total) into the skin daily. Start taking on: May 02, 2020   Lumigan 0.01 % Soln Generic drug: bimatoprost Place 1 drop into both eyes at bedtime.   magic mouthwash Soln Take 5 mLs by mouth 3 (three) times daily as needed for mouth pain.   metFORMIN 500 MG tablet Commonly known as: GLUCOPHAGE Take 1 tablet (500 mg total) by mouth 2 (two) times daily with a meal.   PreserVision AREDS 2 Caps Take 1 capsule by mouth 2 (two) times daily.   senna-docusate 8.6-50 MG tablet Commonly known as: Senokot-S Take 1 tablet by mouth at bedtime.   tamsulosin 0.4 MG Caps capsule Commonly known as: FLOMAX Take 1 capsule (0.4 mg total) by mouth daily after supper for 7 days.   valsartan 40 MG tablet Commonly known as: DIOVAN TAKE 1 TABLET BY MOUTH EVERY DAY   vitamin B-12 500 MCG tablet Commonly known as: CYANOCOBALAMIN Take 1 tablet (500 mcg total) by mouth daily.   VITAMIN D-3 PO Take 1,000 mg by mouth daily after supper.            Discharge Care Instructions  (From admission, onward)         Start     Ordered   05/01/20 0000  Discharge wound care:       Comments: Reinforce dressing, follow up with orthpedics   05/01/20 1359         Allergies  Allergen Reactions  . Valproic Acid And Related Other (See Comments)    Low blood pressure, weakness.  Remus Blake Other (See Comments)    "It made me not feel right"  . Peanut-Containing Drug Products Other (See Comments)    Raises blood sugar    Contact information for follow-up providers    Corky Downs, MD Follow up.   Specialty:  Cardiology Contact information: [address] [place] [address] [phone]        Juanell Fairly, MD Follow up in 2 week(s).   Specialty: Orthopedic Surgery Contact information: [address] [place] [address] [phone]            Contact information for after-discharge care    Destination    [facility] Preferred SNF .   Service: Skilled Nursing Contact information: [address] [place] [address] [phone]                   The results of significant  diagnostics from this hospitalization (including imaging, microbiology, ancillary and laboratory) are listed below for reference.    Significant Diagnostic Studies: DG Chest 1 View  Result Date: 04/21/2020 CLINICAL DATA:  Right hip fracture. EXAM: CHEST  1 VIEW COMPARISON:  None. FINDINGS: The heart size and mediastinal contours are within normal limits. Both lungs are clear. The visualized skeletal structures are unremarkable. IMPRESSION: No active disease. Electronically Signed   By: Lupita Raider M.D.   On: 04/21/2020 10:15   CT Head Wo Contrast  Result Date: 04/21/2020 CLINICAL DATA:  Fall, RIGHT hip deformity with rotation patient on blood thinners. EXAM: CT HEAD WITHOUT CONTRAST CT CERVICAL SPINE WITHOUT CONTRAST TECHNIQUE: Multidetector CT imaging of the head and cervical spine was performed following the standard protocol without intravenous contrast. Multiplanar CT image reconstructions of the cervical spine were also generated. COMPARISON:  Nov 25, 2019 FINDINGS: CT HEAD FINDINGS Brain: No evidence of acute infarction, hemorrhage, hydrocephalus, extra-axial collection or mass lesion/mass effect. Signs of atrophy and chronic microvascular ischemic change as before. Vascular: No hyperdense vessel or unexpected calcification. Skull: Normal. Negative for fracture or focal lesion. Sinuses/Orbits: No acute finding. Other: None. CT CERVICAL SPINE  FINDINGS Alignment: Increased lordosis of the cervical spine is unchanged compared to prior imaging Skull base and vertebrae: No acute fracture. No primary bone lesion or focal pathologic process. Soft tissues and spinal canal: No prevertebral fluid or swelling. No visible canal hematoma. Disc levels: Multilevel degenerative change throughout the cervical spine greatest at C5-6 and C6-7 with near complete loss of the disc space at these levels and signs of uncovertebral degenerative spurring. Facet arthropathy throughout the cervical spine greatest at C3-4 on the RIGHT where there is ankylosis of facets with facet hypertrophy at C4-5 on the LEFT. Upper chest: Biapical pleural and parenchymal scarring is unchanged. Other: None IMPRESSION: 1. No acute intracranial abnormality. 2. Signs of atrophy and chronic microvascular ischemic change as before. 3. No evidence for acute fracture or subluxation of the cervical spine. 4. Multilevel degenerative change and facet arthropathy as described. Electronically Signed   By: Donzetta Kohut M.D.   On: 04/21/2020 11:38   CT Cervical Spine Wo Contrast  Result Date: 04/21/2020 CLINICAL DATA:  Fall, RIGHT hip deformity with rotation patient on blood thinners. EXAM: CT HEAD WITHOUT CONTRAST CT CERVICAL SPINE WITHOUT CONTRAST TECHNIQUE: Multidetector CT imaging of the head and cervical spine was performed following the standard protocol without intravenous contrast. Multiplanar CT image reconstructions of the cervical spine were also generated. COMPARISON:  Nov 25, 2019 FINDINGS: CT HEAD FINDINGS Brain: No evidence of acute infarction, hemorrhage, hydrocephalus, extra-axial collection or mass lesion/mass effect. Signs of atrophy and chronic microvascular ischemic change as before. Vascular: No hyperdense vessel or unexpected calcification. Skull: Normal. Negative for fracture or focal lesion. Sinuses/Orbits: No acute finding. Other: None. CT CERVICAL SPINE FINDINGS Alignment:  Increased lordosis of the cervical spine is unchanged compared to prior imaging Skull base and vertebrae: No acute fracture. No primary bone lesion or focal pathologic process. Soft tissues and spinal canal: No prevertebral fluid or swelling. No visible canal hematoma. Disc levels: Multilevel degenerative change throughout the cervical spine greatest at C5-6 and C6-7 with near complete loss of the disc space at these levels and signs of uncovertebral degenerative spurring. Facet arthropathy throughout the cervical spine greatest at C3-4 on the RIGHT where there is ankylosis of facets with facet hypertrophy at C4-5 on the LEFT. Upper chest: Biapical pleural and parenchymal scarring is unchanged. Other: None  IMPRESSION: 1. No acute intracranial abnormality. 2. Signs of atrophy and chronic microvascular ischemic change as before. 3. No evidence for acute fracture or subluxation of the cervical spine. 4. Multilevel degenerative change and facet arthropathy as described. Electronically Signed   By: Donzetta Kohut M.D.   On: 04/21/2020 11:38   DG HIP OPERATIVE UNILAT W OR W/O PELVIS RIGHT  Result Date: 04/24/2020 CLINICAL DATA:  RIGHT IM nail in OR. EXAM: OPERATIVE RIGHT HIP (WITH PELVIS IF PERFORMED) 2 VIEWS TECHNIQUE: Fluoroscopic spot image(s) were submitted for interpretation post-operatively. COMPARISON:  None. FINDINGS: Intraoperative fluoroscopic spot images of the RIGHT hip are provided showing intramedullary nail within the proximal RIGHT femur with associated dynamic screw traversing the RIGHT femoral neck. Hardware appears intact and appropriately positioned. Osseous alignment is anatomic. Fluoroscopy was provided for 1 minutes. IMPRESSION: Intraoperative films of the RIGHT hip. No evidence of surgical complicating feature. Electronically Signed   By: Bary Richard M.D.   On: 04/24/2020 10:36   DG Hip Unilat  With Pelvis 2-3 Views Right  Result Date: 04/21/2020 CLINICAL DATA:  Right hip pain after  fall. EXAM: DG HIP (WITH OR WITHOUT PELVIS) 2-3V RIGHT COMPARISON:  None. FINDINGS: Severely comminuted and displaced fracture is seen involving the intertrochanteric region of the proximal right femur. IMPRESSION: Severely comminuted and displaced intertrochanteric fracture of proximal right femur. Electronically Signed   By: Lupita Raider M.D.   On: 04/21/2020 10:14   DG FEMUR, MIN 2 VIEWS RIGHT  Result Date: 04/24/2020 CLINICAL DATA:  Status post intramedullary rod fixation of right femoral fracture. EXAM: RIGHT FEMUR 2 VIEWS COMPARISON:  April 21, 2020. FINDINGS: Status post intramedullary rod fixation of proximal right femoral intertrochanteric fracture. Good alignment of fracture components is noted. Expected postoperative changes are seen in the surrounding soft tissues. Vascular calcifications are noted. IMPRESSION: Status post intramedullary rod fixation of proximal right femoral intertrochanteric fracture. Electronically Signed   By: Lupita Raider M.D.   On: 04/24/2020 11:37    Microbiology: Recent Results (from the past 240 hour(s))  Culture, Urine     Status: Abnormal   Collection Time: 04/23/20 10:56 AM   Specimen: Urine, Random  Result Value Ref Range Status   Specimen Description   Final    URINE, RANDOM Performed at [facility], [address], [place], [place] [address]    Special Requests   Final    NONE Performed at [facility], [address] [place], [place] [address]    Culture >=100,000 COLONIES/mL ENTEROCOCCUS FAECALIS (A)  Final   Report Status 04/25/2020 FINAL  Final   Organism ID, Bacteria ENTEROCOCCUS FAECALIS (A)  Final      Susceptibility   Enterococcus faecalis - MIC*    AMPICILLIN <=2 SENSITIVE Sensitive     NITROFURANTOIN <=16 SENSITIVE Sensitive     VANCOMYCIN 2 SENSITIVE Sensitive     * >=100,000 COLONIES/mL ENTEROCOCCUS FAECALIS     Labs: Basic Metabolic Panel: Recent Labs  Lab 04/25/20 0436 04/26/20 0400  04/27/20 0644 04/28/20 0440  NA 136 138 136 137  K 4.3 4.1 3.6 3.7  CL 105 109 105 104  CO2 24 24 24 25   GLUCOSE 230* 235* 211* 196*  BUN 16 18 17 13   CREATININE 0.67 0.58 0.54 0.40*  CALCIUM 8.6* 8.5* 8.2* 8.7*   Liver Function Tests: No results for input(s): AST, ALT, ALKPHOS, BILITOT, PROT, ALBUMIN in the last 168 hours. No results for input(s): LIPASE, AMYLASE in the last 168 hours.  No results for input(s): AMMONIA in the last 168 hours. CBC: Recent Labs  Lab 04/25/20 0436 04/26/20 0400 04/28/20 1342 05/01/20 0453  WBC 8.6 7.5 7.6 8.3  NEUTROABS 6.5 5.6  --   --   HGB 9.8* 8.4* 8.5* 9.4*  HCT 28.3* 24.9* 25.4* 28.2*  MCV 89.8 91.9 90.7 93.4  PLT 160 124* 193 302   Cardiac Enzymes: No results for input(s): CKTOTAL, CKMB, CKMBINDEX, TROPONINI in the last 168 hours. BNP: BNP (last 3 results) No results for input(s): BNP in the last 8760 hours.  ProBNP (last 3 results) No results for input(s): PROBNP in the last 8760 hours.  CBG: Recent Labs  Lab 04/30/20 1129 04/30/20 1635 04/30/20 2036 05/01/20 0801 05/01/20 1149  GLUCAP 253* 231* 180* 190* 271*       Signed:  Albertine Grates MD, PhD, FACP  [facility] 05/01/2020, 2:44 PM

## 2020-05-01 NOTE — Progress Notes (Signed)
Called report to Lupita Leash at [facility] Answered all questions. EMS will transport patient.

## 2020-05-01 NOTE — Progress Notes (Signed)
Physical Therapy Treatment Patient Details Name: Suzanne Cantu MRN: [id number] DOB: 01/20/1927 Today's Date: 05/01/2020    History of Present Illness 86 year old female history of A. fib on Eliquis, CVA, seizure disorder who presents with a mechanical fall and right hip pain.  No loss of consciousness.  On admission x-ray shows severely comminuted and displaced intertrochanteric fracture right proximal femur. S/p IM nail of R hip on 04/24/20    PT Comments    Pt was long sitting in bed upon arriving. She agrees to PT session with encouragement. Pt's supportive daughter present and very helpful throughout. Answered all pt's questions and issued HEP handout with education on proper performance to promote strengthening. Pt agreeable to OOB activity requesting to use BR. She was able to exit R sie of bed. Stand pivot to [facility] with max asisst of one without use of RW. After successfully urinating, was able to stand to RW and take ~ 5 steps with max assist of one + daughter close for +2 assist for safety. Recommend RN staff stand pivot pt only with +2 assist for safety. She tolerated taking steps well but is very fatigued after minimal distance. Overall pt is progressing. Will benefit from continued skilled PT at DC to address deficits with strength, mobility, and safety. Pt wa sin recliner post session with ice pack applied, call bell in reach, and daughter at bedside.   Follow Up Recommendations  SNF     Equipment Recommendations  Other (comment) (defer to next level care)    Recommendations for Other Services       Precautions / Restrictions Precautions Precautions: Fall Restrictions Weight Bearing Restrictions: Yes RLE Weight Bearing: Weight bearing as tolerated    Mobility  Bed Mobility Overal bed mobility: Needs Assistance Bed Mobility: Supine to Sit     Supine to sit: HOB elevated;Mod assist     General bed mobility comments: Pt was able to exit R sid eof bed with HOB  elevated. increased time to perform with mod assist with RLE progression only.   Transfers Overall transfer level: Needs assistance Equipment used: Rolling walker (2 wheeled) Transfers: Sit to/from [employer] Sit to Stand: +2 safety/equipment;Mod assist;Max assist Stand pivot transfers: Max assist;+2 safety/equipment       General transfer comment: Pt was able to stand pivot from EOB to [facility] without use of DME. pt's daughter present throughout session and close standby assist for safety. pt was able to take steps. after successful urination, pt was able to stand to RW and take ~ 5 steps to recliner with therapist protetcing RLE/knee from buckling while pt advanced LLE. she tolerated well but does fatigue quickly with max vcs and tcs for LE progression.   Ambulation/Gait Ambulation/Gait assistance: Max assist;+2 safety/equipment Gait Distance (Feet): 3 Feet Assistive device: Rolling walker (2 wheeled) Gait Pattern/deviations: Step-to pattern;Antalgic (slight R knee buckle) Gait velocity: decreased   General Gait Details: pt took ~ 5 steps to go ~ 3 ft from [facility] to recliner       Balance Overall balance assessment: Needs assistance Sitting-balance support: Feet supported Sitting balance-Leahy Scale: Good Sitting balance - Comments: no LOB sitting EOB x ~ 5 minutes   Standing balance support: During functional activity;Bilateral upper extremity supported Standing balance-Leahy Scale: Poor Standing balance comment: poor standing postyure with heavy reliance on BUE support         Cognition Arousal/Alertness: Awake/alert Behavior During Therapy: WFL for tasks assessed/performed Overall Cognitive Status: Within Functional Limits for tasks assessed  Following Commands: Follows one step commands with increased time;Follows one step commands consistently;Follows multi-step commands with increased time Safety/Judgement: Decreased awareness of  safety;Decreased awareness of deficits   Problem Solving: Slow processing;Requires verbal cues;Requires tactile cues General Comments: Pt is A and O . she is anxious about mobility but once willing, able to perform all desired task             Pertinent Vitals/Pain Pain Assessment:  (0 at rest. minimal in wt bearing and with movements) Pain Location: R hip Pain Descriptors / Indicators: Guarding;Operative site guarding Pain Intervention(s): Limited activity within patient's tolerance;Monitored during session;Premedicated before session;Repositioned;Ice applied           PT Goals (current goals can now be found in the care plan section) Acute Rehab PT Goals Patient Stated Goal: none stated Progress towards PT goals: Progressing toward goals    Frequency    7X/week      PT Plan Current plan remains appropriate    Co-evaluation     PT goals addressed during session: Mobility/safety with mobility;Balance;Proper use of DME;Strengthening/ROM        AM-PAC PT "6 Clicks" Mobility   Outcome Measure  Help needed turning from your back to your side while in a flat bed without using bedrails?: A Little Help needed moving from lying on your back to sitting on the side of a flat bed without using bedrails?: A Lot Help needed moving to and from a bed to a chair (including a wheelchair)?: A Lot Help needed standing up from a chair using your arms (e.g., wheelchair or bedside chair)?: A Lot Help needed to walk in hospital room?: A Lot Help needed climbing 3-5 steps with a railing? : Total 6 Click Score: 12    End of Session Equipment Utilized During Treatment: Gait belt Activity Tolerance: Patient tolerated treatment well;Patient limited by fatigue Patient left: in chair;with call bell/phone within reach;with chair alarm set;with family/visitor present Nurse Communication: Mobility status PT Visit Diagnosis: Muscle weakness (generalized) (M62.81);Other abnormalities of gait  and mobility (R26.89);Difficulty in walking, not elsewhere classified (R26.2);Pain Pain - Right/Left: Right Pain - part of body: Hip     Time: 7001-7494 PT Time Calculation (min) (ACUTE ONLY): 47 min  Charges:  $Therapeutic Exercise: 8-22 mins $Therapeutic Activity: 23-37 mins                     Jetta Lout PTA 05/01/20, 11:39 AM

## 2020-05-01 NOTE — TOC Transition Note (Signed)
Transition of Care [facility]) - CM/SW Discharge Note   Patient Details  Name: Suzanne Cantu MRN: [id number] Date of Birth: 04-20-1927  Transition of Care [facility]) CM/SW Contact:  Maud Deed, LCSW Phone Number: 05/01/2020, 2:51 PM   Clinical Narrative:    Pt medically stable for discharge per MD. Pt will be transported to [facility] via EMS. Call to report number is [phone]. CSW notified pt's daughter [name] of discharge. CSW will arrange transport once nursing staff is ready. [facility]   Final next level of care: Skilled Nursing Facility Barriers to Discharge: No Barriers Identified   Patient Goals and CMS Choice Patient states their goals for this hospitalization and ongoing recovery are:: To return home after surgery. CMS Medicare.gov Compare Post Acute Care list provided to:: Other (Comment Required) Kerrin Champagne [phone]) Choice offered to / list presented to : Patient, Adult Children  Discharge Placement              Patient chooses bed at: [facility] Patient to be transferred to facility by: EMS Name of family member notified: [name] Patient and family notified of of transfer: 05/01/20  Discharge Plan and Services In-house Referral: Clinical Social Work   Post Acute Care Choice: Home Health                               Social Determinants of Health (SDOH) Interventions     Readmission Risk Interventions No flowsheet data found.

## 2020-05-01 NOTE — Progress Notes (Signed)
  Subjective:  Patient reports pain as mild.  Daughter in room.  Objective:   VITALS:   Vitals:   04/30/20 0738 04/30/20 1550 05/01/20 0002 05/01/20 0800  BP: 134/71 (!) 139/56 (!) 150/62 137/65  Pulse: 91 94 85 84  Resp: 15 15 18 17   Temp: 98.4 F (36.9 C) 98.4 F (36.9 C) 97.7 F (36.5 C) 97.9 F (36.6 C)  TempSrc: Oral Oral Oral   SpO2: 95% 95% 98% 96%  Weight:      Height:        PHYSICAL EXAM:  Neurovascular intact Dorsiflexion/Plantar flexion intact Incision: dressing C/D/I No cellulitis present Compartment soft  LABS  Results for orders placed or performed during the hospital encounter of 04/21/20 (from the past 24 hour(s))  Glucose, capillary     Status: Abnormal   Collection Time: 04/30/20  4:35 PM  Result Value Ref Range   Glucose-Capillary 231 (H) 70 - 99 mg/dL   Comment 1 Notify RN    Comment 2 Document in Chart   Glucose, capillary     Status: Abnormal   Collection Time: 04/30/20  8:36 PM  Result Value Ref Range   Glucose-Capillary 180 (H) 70 - 99 mg/dL  CBC     Status: Abnormal   Collection Time: 05/01/20  4:53 AM  Result Value Ref Range   WBC 8.3 4.0 - 10.5 K/uL   RBC 3.02 (L) 3.87 - 5.11 MIL/uL   Hemoglobin 9.4 (L) 12.0 - 15.0 g/dL   HCT 05/03/20 (L) 36 - 46 %   MCV 93.4 80.0 - 100.0 fL   MCH 31.1 26.0 - 34.0 pg   MCHC 33.3 30.0 - 36.0 g/dL   RDW 96.2 95.2 - 84.1 %   Platelets 302 150 - 400 K/uL   nRBC 0.0 0.0 - 0.2 %  Glucose, capillary     Status: Abnormal   Collection Time: 05/01/20  8:01 AM  Result Value Ref Range   Glucose-Capillary 190 (H) 70 - 99 mg/dL  Glucose, capillary     Status: Abnormal   Collection Time: 05/01/20 11:49 AM  Result Value Ref Range   Glucose-Capillary 271 (H) 70 - 99 mg/dL    No results found.  Assessment/Plan: 7 Days Post-Op   Principal Problem:   Closed right hip fracture (HCC) Active Problems:   Essential hypertension   Controlled type 2 diabetes mellitus without complication, without long-term  current use of insulin (HCC)   History of CVA (cerebrovascular accident)   Fall   Atrial fibrillation, chronic (HCC)   Protein-calorie malnutrition, severe   Patient will continue PT.  She is WBAT on right lower extremity. Discharge to SNF today.  On eliquis.  Follow up in 10-14 days with Dr. 03-28-1979 post-op in the office.     Martha Clan , MD 05/01/2020, 12:49 PM

## 2020-05-05 ENCOUNTER — Other Ambulatory Visit: Payer: Self-pay

## 2020-05-05 ENCOUNTER — Non-Acute Institutional Stay: Payer: Medicare Other | Admitting: Primary Care

## 2020-05-05 DIAGNOSIS — S72001S Fracture of unspecified part of neck of right femur, sequela: Secondary | ICD-10-CM

## 2020-05-05 DIAGNOSIS — W19XXXS Unspecified fall, sequela: Secondary | ICD-10-CM

## 2020-05-05 DIAGNOSIS — Z515 Encounter for palliative care: Secondary | ICD-10-CM

## 2020-05-05 DIAGNOSIS — Z8673 Personal history of transient ischemic attack (TIA), and cerebral infarction without residual deficits: Secondary | ICD-10-CM

## 2020-05-05 NOTE — Progress Notes (Signed)
[facility] Consult Note Telephone: [phone]  Fax: [phone]  PATIENT NAME: Suzanne Cantu [address] [place] [address] [phone] (home)  DOB: [birth year] MRN: [id number]  PRIMARY CARE PROVIDER:    Cletis Athens, MD,  [place] [address] [phone]  REFERRING PROVIDER:   Rica Koyanagi, [place],  [place] [id number]   RESPONSIBLE PARTY:   Extended Emergency Contact Information Primary Emergency Contact: Dunbar,Susan Mobile Phone: [phone] Relation: Daughter Secondary Emergency Contact: [name] Home Phone: [phone] Relation: Daughter  I met face to face with patient and family in facility.   ASSESSMENT AND RECOMMENDATIONS:   1. Advance Care Planning/Goals of Care: Goals include to maximize quality of life and symptom management. Our advance care planning conversation included a discussion about:     The value and importance of advance care planning   Exploration of personal, cultural or spiritual beliefs that might influence medical decisions   Exploration of goals of care in the event of a sudden injury or illness   Identification of a healthcare agent - Daughter  Review  of an  advance directive document . Discussed decision not to resuscitatedue to poor prognosis. MOST given. Daughter to review. Our meeting was interrupted by SNF staff. Will complete in next visit.  2. Symptom Management:   Constipation: Has reported no bm in 4-5 days, One dose of MOM given by SNF staff. Order given to repeat this afternoon and initiate miralax 17 gm daily po for constipation management. Patient gets extremely anxious about not defecating.  Pain: Denies, has prn acetaminophen. Needs to be encouraged to move.   Goals of Care: To return home after rehab course finished.  3. Follow up Palliative Care Visit: Palliative care will continue to follow  for goals of care clarification and symptom management. Return 1-2 weeks or prn.  4. Family /Caregiver/Community Supports: Has daughter who lives in home with her prior to SNF admission. At SNF for rehab to return home with 24 hr caregivers.  5. Cognitive / Functional decline: A and O x 2. Very HOH which impacts interview. Has decline due to fall and fx, Currently pivot transferring, needs heavy assist. Dependent in all adls and most ialds.  I spent 45 minutes providing this consultation,  from 1100 to 1145. More than 50% of the time in this consultation was spent coordinating communication.   CHIEF COMPLAINT: constipation, fatigue  HISTORY OF PRESENT ILLNESS:  Suzanne Cantu is a 86 y.o. year old female  with constipation and rehab from femur fracture. We are asked to consult around goals of care and advance directive discussion.    Palliative Care was asked to follow this patient by consultation request of Rica Koyanagi, MD to help address advance care planning and goals of care. This is a follow up visit.  CODE STATUS: TBD  PPS: 40%  HOSPICE ELIGIBILITY/DIAGNOSIS: TBD  ROS  General: NAD EYES: denies vision changes, uses reading glasses ENMT: denies [place] Cardiovascular: denies chest pain Pulmonary: denies cough, denies increased SOB Abdomen: endorses  appetite, endorses constipation, endorses continence of bowel GU: denies dysuria, endorses continence of urine MSK:  endorses ROM limitations, fall was 3 weeks reported Skin: incision from surgery on R femur Neurological: endorses weakness, denies pain, denies insomnia Psych: Endorses positive mood  Physical Exam: Current and past weights: 112 lbs reported in 10/21. Constitutional: NAD  General :frail appearing, thin EYES: anicteric sclera,  lids intact, no discharge  ENMT: hard of hearing,oral mucous membranes moist, dentition intact CV:  no LE edema Pulmonary: no increased work of breathing, no cough, no  audible wheezes, room air Abdomen: intake 50%, no ascites GU: deferred MSK: moderate sacropenia, decreased ROM in all extremities, no contractures of LE, non ambulatory, pivoting with help Skin: warm and dry, wound noted right hip and thigh Neuro: Weakness,mild to moderate cognitive impairment, grossly non -focal Psych: non -anxious affect, A and O x 2 Hem/lymph/immuno: no widespread bruising   CURRENT PROBLEM LIST:  Patient Active Problem List   Diagnosis Date Noted  . Protein-calorie malnutrition, severe 04/23/2020  . Fall 04/21/2020  . Closed right hip fracture ([place]) 04/21/2020  . Atrial fibrillation, chronic ([place]) 04/21/2020  . Seizure ([place]) 12/15/2019  . History of CVA (cerebrovascular accident) 11/07/2019  . CVA (cerebral vascular accident) ([place]) 10/27/2019  . Encephalopathy acute 07/31/2019  . Hypertensive urgency 07/31/2019  . Essential hypertension 07/31/2019  . Controlled type 2 diabetes mellitus without complication, without long-term current use of insulin ([place]) 07/31/2019  . Chronic back pain 07/31/2019  . Hypertensive emergency 07/31/2019  . Acute encephalopathy 07/31/2019   PAST MEDICAL HISTORY:  Active Ambulatory Problems    Diagnosis Date Noted  . Encephalopathy acute 07/31/2019  . Hypertensive urgency 07/31/2019  . Essential hypertension 07/31/2019  . Controlled type 2 diabetes mellitus without complication, without long-term current use of insulin ([place]) 07/31/2019  . Chronic back pain 07/31/2019  . Hypertensive emergency 07/31/2019  . Acute encephalopathy 07/31/2019  . CVA (cerebral vascular accident) ([place]) 10/27/2019  . History of CVA (cerebrovascular accident) 11/07/2019  . Seizure ([place]) 12/15/2019  . Fall 04/21/2020  . Closed right hip fracture ([place]) 04/21/2020  . Atrial fibrillation, chronic ([place]) 04/21/2020  . Protein-calorie malnutrition, severe 04/23/2020   Resolved Ambulatory Problems    Diagnosis Date Noted  . Acute hepatic encephalopathy  07/31/2019   Past Medical History:  Diagnosis Date  . Diabetes mellitus without complication ([place])   . Hypertension   . Stroke [facility])   ,  Past Medical History:  Diagnosis Date  . Diabetes mellitus without complication ([place])   . Hypertension   . Stroke [facility])    SOCIAL HX:  Social History   Tobacco Use  . Smoking status: Never Smoker  . Smokeless tobacco: Never Used  Substance Use Topics  . Alcohol use: Never   FAMILY HX:  Family History  Problem Relation Age of Onset  . Diabetes Mellitus II Sister     ALLERGIES:  Allergies  Allergen Reactions  . Valproic Acid And Related Other (See Comments)    Low blood pressure, weakness.  Janace Hoard Other (See Comments)    "It made me not feel right"  . Peanut-Containing Drug Products Other (See Comments)    Raises blood sugar     PERTINENT MEDICATIONS:  Outpatient Encounter Medications as of 05/05/2020  Medication Sig  . acetaminophen (TYLENOL) 325 MG tablet Take 1-2 tablets (325-650 mg total) by mouth every 6 (six) hours as needed for mild pain (pain score 1-3 or temp > 100.5).  Marland Kitchen amLODipine (NORVASC) 5 MG tablet Take 1 tablet (5 mg total) by mouth daily.  Marland Kitchen apixaban (ELIQUIS) 2.5 MG TABS tablet Take 1 tablet (2.5 mg total) by mouth 2 (two) times daily.  . bimatoprost (LUMIGAN) 0.01 % SOLN Place 1 drop into both eyes at bedtime.  . Cholecalciferol (VITAMIN D-3 PO) Take 1,000 mg by mouth daily after supper.   . feeding supplement, GLUCERNA  SHAKE, (GLUCERNA SHAKE) LIQD Take 237 mLs by mouth 3 (three) times daily between meals.  . gabapentin (NEURONTIN) 300 MG capsule Take 1 capsule (300 mg total) by mouth 2 (two) times daily.  Marland Kitchen ibuprofen (ADVIL) 400 MG tablet Take 1 tablet (400 mg total) by mouth every 6 (six) hours as needed for mild pain or moderate pain.  Marland Kitchen insulin aspart (NOVOLOG) 100 UNIT/ML injection Before each meal 3 times a day, 140-199 - 2 units, 200-250 - 4 units, 251-299 - 6 units,  300-349 - 8 units,  350 or above 10  units. Insulin PEN if approved, provide syringes and needles if needed.  . insulin glargine (LANTUS) 100 UNIT/ML injection Inject 0.1 mLs (10 Units total) into the skin daily.  . magic mouthwash SOLN Take 5 mLs by mouth 3 (three) times daily as needed for mouth pain.  . magnesium hydroxide (MILK OF MAGNESIA) 400 MG/5ML suspension Take 30 mLs by mouth daily as needed for mild constipation.  . metFORMIN (GLUCOPHAGE) 500 MG tablet Take 1 tablet (500 mg total) by mouth 2 (two) times daily with a meal.  . Multiple Vitamins-Minerals (PRESERVISION AREDS 2) CAPS Take 1 capsule by mouth 2 (two) times daily.  Marland Kitchen senna-docusate (SENOKOT-S) 8.6-50 MG tablet Take 1 tablet by mouth at bedtime.  . tamsulosin (FLOMAX) 0.4 MG CAPS capsule Take 1 capsule (0.4 mg total) by mouth daily after supper for 7 days.  . valsartan (DIOVAN) 40 MG tablet TAKE 1 TABLET BY MOUTH EVERY DAY  . vitamin B-12 (CYANOCOBALAMIN) 500 MCG tablet Take 1 tablet (500 mcg total) by mouth daily.  . [DISCONTINUED] amoxicillin (AMOXIL) 250 MG capsule Take 1 capsule (250 mg total) by mouth every 8 (eight) hours for 5 days.  . [DISCONTINUED] ALPRAZolam (XANAX) 0.25 MG tablet Take 0.5 tablets (0.125 mg total) by mouth at bedtime as needed for anxiety.   No facility-administered encounter medications on file as of 05/05/2020.      Jason Coop, NP , DNP, MPH, AGPCNP-BC, ACHPN  COVID-19 PATIENT SCREENING TOOL  Person answering questions: ____________staff______ _____   1.  Is the patient or any family member in the home showing any signs or symptoms regarding respiratory infection?               Person with Symptom- __________NA_________________  a. Fever                                                                          Yes___ No___          ___________________  b. Shortness of breath                                                    Yes___ No___          ___________________ c. Cough/congestion                                        Yes___  No___  ___________________ d. Body aches/pains                                                         Yes___ No___        ____________________ e. Gastrointestinal symptoms (diarrhea, nausea)           Yes___ No___        ____________________  2. Within the past 14 days, has anyone living in the home had any contact with someone with or under investigation for COVID-19?    Yes___ No_X_   Person __________________

## 2020-05-11 ENCOUNTER — Other Ambulatory Visit: Payer: Self-pay

## 2020-05-11 ENCOUNTER — Non-Acute Institutional Stay: Payer: Medicare Other | Admitting: Primary Care

## 2020-05-11 DIAGNOSIS — Z515 Encounter for palliative care: Secondary | ICD-10-CM

## 2020-05-11 DIAGNOSIS — S72001S Fracture of unspecified part of neck of right femur, sequela: Secondary | ICD-10-CM

## 2020-05-11 DIAGNOSIS — W19XXXS Unspecified fall, sequela: Secondary | ICD-10-CM

## 2020-05-11 NOTE — Progress Notes (Signed)
Therapist, nutritional Palliative Care Consult Note Telephone: [phone]  Fax: [phone]    TELEHEALTH VISIT STATEMENT Due to the COVID-19 crisis, this visit was done via telemedicine from my office. It was initiated and consented to by this patient and/or family.  Date of encounter: 05/11/20 PATIENT NAME: Suzanne Cantu [address] [place] [address] [phone] (home)  DOB: [birth year] MRN: [id number]  PRIMARY CARE PROVIDER:    Corky Downs, MD,  [address] [place] [address] [phone]  REFERRING PROVIDER:   Rosetta Posner MD  RESPONSIBLE PARTY:   Extended Emergency Contact Information Primary Emergency Contact: Dunbar,Susan Mobile Phone: [phone] Relation: Daughter Secondary Emergency Contact: Kerrin Champagne Home Phone: [phone] Relation: Daughter  Palliative Care was asked to follow this patient by consultation request of Rosetta Posner, MD  to help address advance care planning and goals of care. This is a follow up visit.   ASSESSMENT AND RECOMMENDATIONS:   1. Advance Care Planning/Goals of Care: Goals include to maximize quality of life and symptom management. Our advance care planning conversation included a discussion about:     The value and importance of advance care planning   Experiences with loved ones who have been seriously ill or have died   Exploration of personal, cultural or spiritual beliefs that might influence medical decisions   Exploration of goals of care in the event of a sudden injury or illness   Identificationof a healthcare agent - Daughter [name] is spokeswoman  Review of an  advance directive document- MOST  Decision not to resuscitate or to de-escalate disease focused treatments due to poor prognosis.  Wants DNR, Limited scope, antibiotics,  limited use of IV, no feeding tube.   2. Symptom Management:   We discussed patient's rehab course while at SNF.  She is improving. Family goal is to take home for care. We discussed home health and equipment needs. I will ask SNF SW to f/u with these referrals in her d/c planning of patient. Likely d/c in a week.  Daughter is a willing and able caregiver and needs assistance of DME bed and mattress for positioning needs, safety while eating and transferring to chair.  3. Follow up Palliative Care Visit: Palliative care will continue to follow for goals of care clarification and symptom management. Return 2 weeks or prn.  4. Family /Caregiver/Community Supports:  In SNF currently for rehab, lives with family care givers in own home. Would like to d/c back to this situation with community supports eg home health, DME.  5. Cognitive / Functional decline: A and O x 1, needs cueing and supervision with all adls, dependent in all iadls.  I spent 50 minutes providing this consultation,  from 1015 to 1105. More than 50% of the time in this consultation was spent coordinating communication.   CODE STATUS:DNR  PPS: 30%  HOSPICE ELIGIBILITY/DIAGNOSIS: TBD  Subjective:  CHIEF COMPLAINT: debility and delirium  HISTORY OF PRESENT ILLNESS:  Suzanne Cantu is a 86 y.o. year old female  with recent fall and fx, delirium following and now is in SNF for rehab. Has increased debility from fall and fx, but is making improvements due to PT/OT in SNF. Goals of care needed to be established per POA and other family decision makers.    We are asked to consult around ACP, goals of care and supportive care plan.   History obtained from review of EMR, discussion with primary team, and interview with  family and/or Ms. Simons. Records reviewed and summarized above.  CURRENT PROBLEM LIST:  Patient Active Problem List   Diagnosis Date Noted  . Protein-calorie malnutrition, severe 04/23/2020  . Fall 04/21/2020  . Closed right hip fracture (HCC) 04/21/2020  . Atrial fibrillation, chronic (HCC) 04/21/2020  . Seizure (HCC)  12/15/2019  . History of CVA (cerebrovascular accident) 11/07/2019  . CVA (cerebral vascular accident) (HCC) 10/27/2019  . Encephalopathy acute 07/31/2019  . Hypertensive urgency 07/31/2019  . Essential hypertension 07/31/2019  . Controlled type 2 diabetes mellitus without complication, without long-term current use of insulin (HCC) 07/31/2019  . Chronic back pain 07/31/2019  . Hypertensive emergency 07/31/2019  . Acute encephalopathy 07/31/2019   PAST MEDICAL HISTORY:  Active Ambulatory Problems    Diagnosis Date Noted  . Encephalopathy acute 07/31/2019  . Hypertensive urgency 07/31/2019  . Essential hypertension 07/31/2019  . Controlled type 2 diabetes mellitus without complication, without long-term current use of insulin (HCC) 07/31/2019  . Chronic back pain 07/31/2019  . Hypertensive emergency 07/31/2019  . Acute encephalopathy 07/31/2019  . CVA (cerebral vascular accident) (HCC) 10/27/2019  . History of CVA (cerebrovascular accident) 11/07/2019  . Seizure (HCC) 12/15/2019  . Fall 04/21/2020  . Closed right hip fracture (HCC) 04/21/2020  . Atrial fibrillation, chronic (HCC) 04/21/2020  . Protein-calorie malnutrition, severe 04/23/2020   Resolved Ambulatory Problems    Diagnosis Date Noted  . Acute hepatic encephalopathy 07/31/2019   Past Medical History:  Diagnosis Date  . Diabetes mellitus without complication (HCC)   . Hypertension   . Stroke [facility])    SOCIAL HX:  Social History   Tobacco Use  . Smoking status: Never Smoker  . Smokeless tobacco: Never Used  Substance Use Topics  . Alcohol use: Never   FAMILY HX:  Family History  Problem Relation Age of Onset  . Diabetes Mellitus II Sister     ALLERGIES:  Allergies  Allergen Reactions  . Valproic Acid And Related Other (See Comments)    Low blood pressure, weakness.  Remus Blake Other (See Comments)    "It made me not feel right"  . Peanut-Containing Drug Products Other (See Comments)    Raises  blood sugar     PERTINENT MEDICATIONS:  Outpatient Encounter Medications as of 05/11/2020  Medication Sig  . acetaminophen (TYLENOL) 325 MG tablet Take 1-2 tablets (325-650 mg total) by mouth every 6 (six) hours as needed for mild pain (pain score 1-3 or temp > 100.5).  Marland Kitchen amLODipine (NORVASC) 5 MG tablet Take 1 tablet (5 mg total) by mouth daily.  Marland Kitchen apixaban (ELIQUIS) 2.5 MG TABS tablet Take 1 tablet (2.5 mg total) by mouth 2 (two) times daily.  . bimatoprost (LUMIGAN) 0.01 % SOLN Place 1 drop into both eyes at bedtime.  . Cholecalciferol (VITAMIN D-3 PO) Take 1,000 mg by mouth daily after supper.   . feeding supplement, GLUCERNA SHAKE, (GLUCERNA SHAKE) LIQD Take 237 mLs by mouth 3 (three) times daily between meals.  . gabapentin (NEURONTIN) 300 MG capsule Take 1 capsule (300 mg total) by mouth 2 (two) times daily.  Marland Kitchen ibuprofen (ADVIL) 400 MG tablet Take 1 tablet (400 mg total) by mouth every 6 (six) hours as needed for mild pain or moderate pain.  Marland Kitchen insulin aspart (NOVOLOG) 100 UNIT/ML injection Before each meal 3 times a day, 140-199 - 2 units, 200-250 - 4 units, 251-299 - 6 units,  300-349 - 8 units,  350 or above 10 units. Insulin PEN if approved, provide syringes and  needles if needed.  . insulin glargine (LANTUS) 100 UNIT/ML injection Inject 0.1 mLs (10 Units total) into the skin daily.  . magic mouthwash SOLN Take 5 mLs by mouth 3 (three) times daily as needed for mouth pain.  . magnesium hydroxide (MILK OF MAGNESIA) 400 MG/5ML suspension Take 30 mLs by mouth daily as needed for mild constipation.  . metFORMIN (GLUCOPHAGE) 500 MG tablet Take 1 tablet (500 mg total) by mouth 2 (two) times daily with a meal.  . Multiple Vitamins-Minerals (PRESERVISION AREDS 2) CAPS Take 1 capsule by mouth 2 (two) times daily.  Marland Kitchen senna-docusate (SENOKOT-S) 8.6-50 MG tablet Take 1 tablet by mouth at bedtime.  . valsartan (DIOVAN) 40 MG tablet TAKE 1 TABLET BY MOUTH EVERY DAY  . vitamin B-12 (CYANOCOBALAMIN)  500 MCG tablet Take 1 tablet (500 mcg total) by mouth daily.   No facility-administered encounter medications on file as of 05/11/2020.    Objective: deferred  Thank you for the opportunity to participate in the care of Ms. Yin.  The palliative care team will continue to follow. Please call our office at [phone] if we can be of additional assistance.  Eliezer Lofts, NP , DNP, MPH, AGPCNP-BC, [facility]

## 2020-05-12 ENCOUNTER — Ambulatory Visit (INDEPENDENT_AMBULATORY_CARE_PROVIDER_SITE_OTHER): Payer: Medicare Other | Admitting: Family Medicine

## 2020-05-12 ENCOUNTER — Other Ambulatory Visit: Payer: Self-pay

## 2020-05-12 ENCOUNTER — Encounter: Payer: Self-pay | Admitting: Family Medicine

## 2020-05-12 VITALS — BP 149/69 | HR 101 | Ht 63.0 in | Wt 120.4 lb

## 2020-05-12 DIAGNOSIS — I10 Essential (primary) hypertension: Secondary | ICD-10-CM

## 2020-05-12 DIAGNOSIS — Z09 Encounter for follow-up examination after completed treatment for conditions other than malignant neoplasm: Secondary | ICD-10-CM | POA: Diagnosis not present

## 2020-05-12 NOTE — Assessment & Plan Note (Signed)
Patient is 3 week post R femur fx with ORIF, xray show excellent healing of the bone facilitated by [facility] rehab post op care. Patient is here today with daughter to evaluate the incision which has become red over the last few days. The wound is well approximated with a honeycomb dressing over the area, erythema without drainage along edges of incision.  Discussed likelihood of the staples being in her mothers skin too long as the offending cause of redness. She has appt with ortho in 1 week but daughter is going to call and get an earlier appt.

## 2020-05-12 NOTE — Progress Notes (Signed)
Established Patient Office Visit  SUBJECTIVE:  Subjective  Patient ID: Suzanne Cantu, female    DOB: [birth year]  Age: 86 y.o. MRN: [id number]  CC:  Chief Complaint  Patient presents with  . Transitions Of Care    Patient is here today to follow up from a recent fall she had. Patient has been at [facility] 6 weeks.     HPI Suzanne Cantu is a 86 y.o. female presenting today for incision evaluation from recent surgical hip repair.   Past Medical History:  Diagnosis Date  . Diabetes mellitus without complication (HCC)   . Hypertension   . Stroke [facility])     Past Surgical History:  Procedure Laterality Date  . ABDOMINAL HYSTERECTOMY    . CESAREAN SECTION     4  . CHOLECYSTECTOMY    . INTRAMEDULLARY (IM) NAIL INTERTROCHANTERIC Right 04/24/2020   Procedure: INTRAMEDULLARY (IM) NAIL INTERTROCHANTRIC;  Surgeon: Juanell Fairly, MD;  Location: [facility] ORS;  Service: Orthopedics;  Laterality: Right;  . KIDNEY STONE SURGERY      Family History  Problem Relation Age of Onset  . Diabetes Mellitus II Sister     Social History   Socioeconomic History  . Marital status: Widowed    Spouse name: Not on file  . Number of children: Not on file  . Years of education: Not on file  . Highest education level: Not on file  Occupational History  . Not on file  Tobacco Use  . Smoking status: Never Smoker  . Smokeless tobacco: Never Used  Substance and Sexual Activity  . Alcohol use: Never  . Drug use: Never  . Sexual activity: Not on file  Other Topics Concern  . Not on file  Social History Narrative  . Not on file   Social Determinants of Health   Financial Resource Strain:   . Difficulty of Paying Living Expenses: Not on file  Food Insecurity:   . Worried About Programme researcher, broadcasting/film/video in the Last Year: Not on file  . Ran Out of Food in the Last Year: Not on file  Transportation Needs:   . Lack of Transportation (Medical): Not on file  . Lack of Transportation  (Non-Medical): Not on file  Physical Activity:   . Days of Exercise per Week: Not on file  . Minutes of Exercise per Session: Not on file  Stress:   . Feeling of Stress : Not on file  Social Connections:   . Frequency of Communication with Friends and Family: Not on file  . Frequency of Social Gatherings with Friends and Family: Not on file  . Attends Religious Services: Not on file  . Active Member of Clubs or Organizations: Not on file  . Attends Banker Meetings: Not on file  . Marital Status: Not on file  Intimate Partner Violence:   . Fear of Current or Ex-Partner: Not on file  . Emotionally Abused: Not on file  . Physically Abused: Not on file  . Sexually Abused: Not on file     Current Outpatient Medications:  .  acetaminophen (TYLENOL) 325 MG tablet, Take 1-2 tablets (325-650 mg total) by mouth every 6 (six) hours as needed for mild pain (pain score 1-3 or temp > 100.5)., Disp: , Rfl:  .  amLODipine (NORVASC) 5 MG tablet, Take 1 tablet (5 mg total) by mouth daily., Disp: , Rfl:  .  apixaban (ELIQUIS) 2.5 MG TABS tablet, Take 1 tablet (2.5 mg total) by mouth  2 (two) times daily., Disp: 60 tablet, Rfl: 6 .  bimatoprost (LUMIGAN) 0.01 % SOLN, Place 1 drop into both eyes at bedtime., Disp: , Rfl:  .  Cholecalciferol (VITAMIN D-3 PO), Take 1,000 mg by mouth daily after supper. , Disp: , Rfl:  .  feeding supplement, GLUCERNA SHAKE, (GLUCERNA SHAKE) LIQD, Take 237 mLs by mouth 3 (three) times daily between meals., Disp: , Rfl: 0 .  gabapentin (NEURONTIN) 300 MG capsule, Take 1 capsule (300 mg total) by mouth 2 (two) times daily., Disp: , Rfl:  .  ibuprofen (ADVIL) 400 MG tablet, Take 1 tablet (400 mg total) by mouth every 6 (six) hours as needed for mild pain or moderate pain., Disp: 30 tablet, Rfl: 0 .  insulin aspart (NOVOLOG) 100 UNIT/ML injection, Before each meal 3 times a day, 140-199 - 2 units, 200-250 - 4 units, 251-299 - 6 units,  300-349 - 8 units,  350 or above  10 units. Insulin PEN if approved, provide syringes and needles if needed., Disp: 10 mL, Rfl: 0 .  insulin glargine (LANTUS) 100 UNIT/ML injection, Inject 0.1 mLs (10 Units total) into the skin daily., Disp: 10 mL, Rfl: 0 .  magic mouthwash SOLN, Take 5 mLs by mouth 3 (three) times daily as needed for mouth pain., Disp: , Rfl: 0 .  metFORMIN (GLUCOPHAGE) 500 MG tablet, Take 1 tablet (500 mg total) by mouth 2 (two) times daily with a meal., Disp: 180 tablet, Rfl: 3 .  Multiple Vitamins-Minerals (PRESERVISION AREDS 2) CAPS, Take 1 capsule by mouth 2 (two) times daily., Disp: , Rfl:  .  senna-docusate (SENOKOT-S) 8.6-50 MG tablet, Take 1 tablet by mouth at bedtime., Disp: , Rfl:  .  valsartan (DIOVAN) 40 MG tablet, TAKE 1 TABLET BY MOUTH EVERY DAY, Disp: 90 tablet, Rfl: 1   Allergies  Allergen Reactions  . Valproic Acid And Related Other (See Comments)    Low blood pressure, weakness.  Remus Blake Other (See Comments)    "It made me not feel right"  . Peanut-Containing Drug Products Other (See Comments)    Raises blood sugar    ROS Review of Systems  Constitutional: Negative.   HENT: Negative.   Respiratory: Negative.   Cardiovascular: Negative.   Musculoskeletal: Positive for gait problem.  Skin: Positive for wound.  Psychiatric/Behavioral: Negative.   All other systems reviewed and are negative.    OBJECTIVE:    Physical Exam Vitals and nursing note reviewed.  HENT:     Mouth/Throat:     Mouth: Mucous membranes are moist.  Eyes:     Pupils: Pupils are equal, round, and reactive to light.  Cardiovascular:     Rate and Rhythm: Normal rate.  Pulmonary:     Effort: Pulmonary effort is normal.  Musculoskeletal:        General: Signs of injury present.  Skin:    General: Skin is warm.  Neurological:     Mental Status: She is alert.  Psychiatric:        Mood and Affect: Mood normal.     BP (!) 149/69   Pulse (!) 101   Ht 5\' 3"  (1.6 m)   Wt 120 lb 6.4 oz (54.6 kg)    BMI 21.33 kg/m  Wt Readings from Last 3 Encounters:  05/12/20 120 lb 6.4 oz (54.6 kg)  04/21/20 112 lb (50.8 kg)  12/15/19 111 lb 11.2 oz (50.7 kg)    Health Maintenance Due  Topic Date Due  . FOOT EXAM  Never done  . OPHTHALMOLOGY EXAM  Never done  . COVID-19 Vaccine (1) Never done  . DEXA SCAN  Never done  . PNA vac Low Risk Adult (1 of 2 - PCV13) Never done  . INFLUENZA VACCINE  Never done    There are no preventive care reminders to display for this patient.  CBC Latest Ref Rng & Units 05/01/2020 04/28/2020 04/26/2020  WBC 4.0 - 10.5 K/uL 8.3 7.6 7.5  Hemoglobin 12.0 - 15.0 g/dL 5.8(K) 9.9(I) 3.3(A)  Hematocrit 36 - 46 % 28.2(L) 25.4(L) 24.9(L)  Platelets 150 - 400 K/uL 302 193 124(L)   CMP Latest Ref Rng & Units 04/28/2020 04/27/2020 04/26/2020  Glucose 70 - 99 mg/dL 250(N) 397(Q) 734(L)  BUN 8 - 23 mg/dL 13 17 18   Creatinine 0.44 - 1.00 mg/dL ) 9.37(T 0.24  Sodium 135 - 145 mmol/L 137 136 138  Potassium 3.5 - 5.1 mmol/L 3.7 3.6 4.1  Chloride 98 - 111 mmol/L 104 105 109  CO2 22 - 32 mmol/L 25 24 24   Calcium 8.9 - 10.3 mg/dL 0.97) ) 3.5(H)  Total Protein 6.5 - 8.1 g/dL - - -  Total Bilirubin 0.3 - 1.2 mg/dL - - -  Alkaline Phos 38 - 126 U/L - - -  AST 15 - 41 U/L - - -  ALT 0 - 44 U/L - - -    Lab Results  Component Value Date   TSH 2.468 08/01/2019   Lab Results  Component Value Date   ALBUMIN 3.4 (L) 11/25/2019   ANIONGAP 8 04/28/2020   Lab Results  Component Value Date   CHOL 185 10/28/2019   HDL 51 10/28/2019   LDLCALC 116 (H) 10/28/2019   CHOLHDL 3.6 10/28/2019   Lab Results  Component Value Date   TRIG 90 10/28/2019   Lab Results  Component Value Date   HGBA1C 7.3 (H) 04/21/2020   HGBA1C 6.5 (H) 10/28/2019   HGBA1C 6.6 (H) 07/31/2019      ASSESSMENT & PLAN:   Problem List Items Addressed This Visit      Cardiovascular and Mediastinum   Essential hypertension    BP mildly elevated today, no reason for concern at this point  due to current situation at rehab and her age, all kidney function in acceptable range.         Other   Fracture (healed) treatment follow-up - Primary    Patient is 3 week post R femur fx with ORIF, xray show excellent healing of the bone facilitated by [facility] rehab post op care. Patient is here today with daughter to evaluate the incision which has become red over the last few days. The wound is well approximated with a honeycomb dressing over the area, erythema without drainage along edges of incision.  Discussed likelihood of the staples being in her mothers skin too long as the offending cause of redness. She has appt with ortho in 1 week but daughter is going to call and get an earlier appt.           No orders of the defined types were placed in this encounter.     Follow-up: No follow-ups on file.    10/30/2019, FNP [facility] [address], [place], [address] [place]

## 2020-05-12 NOTE — Assessment & Plan Note (Signed)
BP mildly elevated today, no reason for concern at this point due to current situation at rehab and her age, all kidney function in acceptable range.

## 2020-05-13 NOTE — Addendum Note (Signed)
Addended by: Irish Lack on: 05/13/2020 04:30 PM   Modules accepted: Level of Service

## 2020-05-24 ENCOUNTER — Other Ambulatory Visit: Payer: Self-pay | Admitting: *Deleted

## 2020-05-24 MED ORDER — CIPROFLOXACIN HCL 500 MG PO TABS: 500.0000 mg | ORAL_TABLET | Freq: Two times a day (BID) | ORAL | 0 refills | Status: DC

## 2020-05-24 MED ORDER — ALPRAZOLAM 0.25 MG PO TABS: 0.2500 mg | ORAL_TABLET | Freq: Every day | ORAL | 0 refills | Status: DC

## 2020-06-15 ENCOUNTER — Other Ambulatory Visit: Payer: Self-pay | Admitting: Internal Medicine

## 2020-06-15 ENCOUNTER — Other Ambulatory Visit: Payer: Self-pay | Admitting: *Deleted

## 2020-06-15 MED ORDER — CIPROFLOXACIN HCL 500 MG PO TABS
500.0000 mg | ORAL_TABLET | Freq: Two times a day (BID) | ORAL | 0 refills | Status: DC
Start: 2020-06-15 — End: 2020-08-16

## 2020-06-29 ENCOUNTER — Other Ambulatory Visit: Payer: Medicare Other | Admitting: Adult Health Nurse Practitioner

## 2020-07-13 ENCOUNTER — Other Ambulatory Visit: Payer: Self-pay

## 2020-07-13 ENCOUNTER — Other Ambulatory Visit: Payer: Self-pay | Admitting: Internal Medicine

## 2020-07-13 ENCOUNTER — Other Ambulatory Visit: Payer: Medicare Other | Admitting: Adult Health Nurse Practitioner

## 2020-07-13 DIAGNOSIS — R197 Diarrhea, unspecified: Secondary | ICD-10-CM

## 2020-07-13 DIAGNOSIS — Z515 Encounter for palliative care: Secondary | ICD-10-CM

## 2020-07-13 DIAGNOSIS — S72001S Fracture of unspecified part of neck of right femur, sequela: Secondary | ICD-10-CM

## 2020-07-13 DIAGNOSIS — R350 Frequency of micturition: Secondary | ICD-10-CM

## 2020-07-13 MED ORDER — ALPRAZOLAM 0.25 MG PO TABS: 0.2500 mg | ORAL_TABLET | Freq: Every day | ORAL | 0 refills | Status: AC

## 2020-07-13 NOTE — Progress Notes (Signed)
Therapist, nutritional Palliative Care Consult Note Telephone: [phone]  Fax: [phone]  PATIENT NAME: Suzanne Cantu DOB: 11/25/1926 MRN: [id number]  PRIMARY CARE PROVIDER:   Corky Downs, MD  REFERRING PROVIDER:  Corky Downs, MD [address] [place],  [place] [address]  RESPONSIBLE PARTY:   Eino Farber, daughter [phone]  Chief complaint:  Follow up palliative visit/diarrhea    Called prior to appointment to confirm appointment and performed COVID screening.  RECOMMENDATIONS and PLAN:  1.  Advanced care planning.  Patient is DNR  2.  Diarrhea.  Have encouraged daughter to reach out to NP at [facility] or PCP for testing of stool to rule out c.diff. and to check for any blood that may be in the stool though no melena or hematochezia noted.  Daughter states that she will reach out to PCP today with this request.    3.  Pain.  Patient still has some pain in right leg related to right hip fracture.  This is relieved with Tylenol.  Functional status appears to be back at baseline and she is able to walk and perform exercises on the right leg.  Continue exercises and Tylenol PRN for pain.  Palliative will continue to monitor for symptom management/decline and make recommendations as needed.  Follow up in 8 weeks.  Encouraged to call with any questions or concerns.  I spent 60 minutes providing this consultation. More than 50% of the time in this consultation was spent coordinating communication.   HISTORY OF PRESENT ILLNESS:  Suzanne Cantu is a 87 y.o. year old female with multiple medical problems including weakness post CVA, HTN, DMT2, a-fib. Palliative Care was asked to help address goals of care. Patient was in hospital 10/20-10/30/21 for right hip fracture post fall and then was at [facility] for short term rehab.  Patient has been back at home since before Thanksgiving. She does endorse getting pain in right leg from time to time with  weight bearing.  This is relieved with Tylenol.  She does her exercised left by PT with her caregivers and is able to stand and pivot and can ambulate short distances with a walker.She has gone through 2 rounds of antibiotics since being home for UTI.  Finished antibiotic therapy on 06/22/20.  Per daughter and caregiver patient has been having watery stool that is light brown about twice per day for several weeks now. No mucous in the stool and though has foul odor caregiver states that it does not smell like c.diff.  Daughter does states that she has talked with NP from [facility] about the diarrhea. She had an episode about 3 days ago in which her speech was muffled/slurred and she was weak. These symptoms improved when given pedialyte.  Patient has normal speech today and denies any increased weakness or SOB.  Daughter and caregiver believe this could have been dehydration with the improvement after having pedialyte.  Also had concerns for TIA and patient doe have history of CVA and TIA.  Patient denies fever, cough, sore throat, N/V, headache, dizziness, chest pain, dysuria, hematuria, hematochezia, melena.    CODE STATUS: DNR  PPS: 40% HOSPICE ELIGIBILITY/DIAGNOSIS: TBD  PHYSICAL EXAM:  BP 128/68  HR 91  O2 99% on RA temp 98.3 General: NAD, frail appearing, thin Eyes: sclera anicteric and noninjected with no discharge noted Cardiovascular: regular rate and rhythm Pulmonary: lung sounds clear; normal respiratory effort Abdomen: soft, nontender, + bowel sounds GU: no suprapubic tenderness Extremities: no  edema, no joint deformities Skin: no rashes Neurological: Weakness but otherwise nonfocal   PAST MEDICAL HISTORY:  Past Medical History:  Diagnosis Date  . Diabetes mellitus without complication (HCC)   . Hypertension   . Stroke [facility])     SOCIAL HX:  Social History   Tobacco Use  . Smoking status: Never Smoker  . Smokeless tobacco: Never Used  Substance Use Topics  . Alcohol use:  Never    ALLERGIES:  Allergies  Allergen Reactions  . Valproic Acid And Related Other (See Comments)    Low blood pressure, weakness.  Remus Blake Other (See Comments)    "It made me not feel right"  . Peanut-Containing Drug Products Other (See Comments)    Raises blood sugar     PERTINENT MEDICATIONS:  Outpatient Encounter Medications as of 07/13/2020  Medication Sig  . acetaminophen (TYLENOL) 325 MG tablet Take 1-2 tablets (325-650 mg total) by mouth every 6 (six) hours as needed for mild pain (pain score 1-3 or temp > 100.5).  Marland Kitchen ALPRAZolam (XANAX) 0.25 MG tablet Take 1 tablet (0.25 mg total) by mouth daily.  Marland Kitchen amLODipine (NORVASC) 5 MG tablet Take 1 tablet (5 mg total) by mouth daily.  Marland Kitchen apixaban (ELIQUIS) 2.5 MG TABS tablet Take 1 tablet (2.5 mg total) by mouth 2 (two) times daily.  . bimatoprost (LUMIGAN) 0.01 % SOLN Place 1 drop into both eyes at bedtime.  . Cholecalciferol (VITAMIN D-3 PO) Take 1,000 mg by mouth daily after supper.   . ciprofloxacin (CIPRO) 500 MG tablet Take 1 tablet (500 mg total) by mouth 2 (two) times daily.  . feeding supplement, GLUCERNA SHAKE, (GLUCERNA SHAKE) LIQD Take 237 mLs by mouth 3 (three) times daily between meals.  . gabapentin (NEURONTIN) 300 MG capsule Take 1 capsule (300 mg total) by mouth 2 (two) times daily.  Marland Kitchen ibuprofen (ADVIL) 400 MG tablet Take 1 tablet (400 mg total) by mouth every 6 (six) hours as needed for mild pain or moderate pain.  Marland Kitchen insulin aspart (NOVOLOG) 100 UNIT/ML injection Before each meal 3 times a day, 140-199 - 2 units, 200-250 - 4 units, 251-299 - 6 units,  300-349 - 8 units,  350 or above 10 units. Insulin PEN if approved, provide syringes and needles if needed.  . insulin glargine (LANTUS) 100 UNIT/ML injection Inject 0.1 mLs (10 Units total) into the skin daily.  . magic mouthwash SOLN Take 5 mLs by mouth 3 (three) times daily as needed for mouth pain.  . metFORMIN (GLUCOPHAGE) 500 MG tablet Take 1 tablet (500 mg  total) by mouth 2 (two) times daily with a meal.  . Multiple Vitamins-Minerals (PRESERVISION AREDS 2) CAPS Take 1 capsule by mouth 2 (two) times daily.  Marland Kitchen senna-docusate (SENOKOT-S) 8.6-50 MG tablet Take 1 tablet by mouth at bedtime.  . valsartan (DIOVAN) 40 MG tablet TAKE 1 TABLET BY MOUTH EVERY DAY   No facility-administered encounter medications on file as of 07/13/2020.     Josiel Gahm Marlena Clipper, NP

## 2020-07-14 ENCOUNTER — Telehealth: Payer: Self-pay

## 2020-07-14 NOTE — Telephone Encounter (Signed)
At the request of Angelique Holm NP, phone call returned to patient's daughter, [name]. [name] shared that she had picked up the specimen cup for stool specimen to r/o c-diff. [name] was able to obtain specimen and was told that Palliative care would pick up specimen. [name] made aware that Palliative care was not able to pick up specimen. [name] shared that PCP office closed at 1 pm today. [name] to reach out to [employer] as well.

## 2020-08-16 ENCOUNTER — Other Ambulatory Visit: Payer: Self-pay

## 2020-08-16 ENCOUNTER — Encounter: Payer: Self-pay | Admitting: Internal Medicine

## 2020-08-16 ENCOUNTER — Encounter: Payer: Self-pay | Admitting: Urology

## 2020-08-16 ENCOUNTER — Ambulatory Visit (INDEPENDENT_AMBULATORY_CARE_PROVIDER_SITE_OTHER): Payer: Medicare Other | Admitting: Urology

## 2020-08-16 ENCOUNTER — Ambulatory Visit (INDEPENDENT_AMBULATORY_CARE_PROVIDER_SITE_OTHER): Payer: Medicare Other | Admitting: Internal Medicine

## 2020-08-16 VITALS — BP 157/76 | HR 91 | Ht 60.0 in | Wt 108.0 lb

## 2020-08-16 VITALS — BP 171/89 | HR 82 | Ht 60.0 in | Wt 108.0 lb

## 2020-08-16 DIAGNOSIS — R350 Frequency of micturition: Secondary | ICD-10-CM

## 2020-08-16 DIAGNOSIS — E119 Type 2 diabetes mellitus without complications: Secondary | ICD-10-CM

## 2020-08-16 DIAGNOSIS — N1330 Unspecified hydronephrosis: Secondary | ICD-10-CM | POA: Diagnosis not present

## 2020-08-16 DIAGNOSIS — I480 Paroxysmal atrial fibrillation: Secondary | ICD-10-CM

## 2020-08-16 DIAGNOSIS — R339 Retention of urine, unspecified: Secondary | ICD-10-CM | POA: Diagnosis not present

## 2020-08-16 DIAGNOSIS — G459 Transient cerebral ischemic attack, unspecified: Secondary | ICD-10-CM | POA: Insufficient documentation

## 2020-08-16 DIAGNOSIS — Z8673 Personal history of transient ischemic attack (TIA), and cerebral infarction without residual deficits: Secondary | ICD-10-CM | POA: Diagnosis not present

## 2020-08-16 DIAGNOSIS — I10 Essential (primary) hypertension: Secondary | ICD-10-CM | POA: Diagnosis not present

## 2020-08-16 LAB — URINALYSIS, COMPLETE
Bilirubin, UA: NEGATIVE
Glucose, UA: NEGATIVE
Ketones, UA: NEGATIVE
Leukocytes,UA: NEGATIVE
Nitrite, UA: NEGATIVE
Protein,UA: NEGATIVE
RBC, UA: NEGATIVE
Specific Gravity, UA: 1.015 (ref 1.005–1.030)
Urobilinogen, Ur: 0.2 mg/dL (ref 0.2–1.0)
pH, UA: 6 (ref 5.0–7.5)

## 2020-08-16 LAB — MICROSCOPIC EXAMINATION

## 2020-08-16 LAB — BLADDER SCAN AMB NON-IMAGING

## 2020-08-16 LAB — GLUCOSE, POCT (MANUAL RESULT ENTRY): POC Glucose: 226 mg/dl — AB (ref 70–99)

## 2020-08-16 MED ORDER — APIXABAN 2.5 MG PO TABS: 5.0000 mg | ORAL_TABLET | Freq: Two times a day (BID) | ORAL | 6 refills | Status: DC

## 2020-08-16 NOTE — Assessment & Plan Note (Signed)
Patient is a 2 episode of confusion suspicious for TIA so we will increase his Eliquis

## 2020-08-16 NOTE — Assessment & Plan Note (Signed)
Patient is in atrial fibrillation.  I increase his Eliquis to 5 mg p.o. twice a day because of some episode which is suggestive of TIA

## 2020-08-16 NOTE — Assessment & Plan Note (Signed)
Blood sugar was checked today and it was under control she was not hypoglycemic.

## 2020-08-16 NOTE — Assessment & Plan Note (Signed)
Patient was advised to take her medicine regularly.  Her blood pressure was found to be high today she takes valsartan 40 mg p.o. daily and has taken the medicine today.

## 2020-08-16 NOTE — Progress Notes (Signed)
08/16/2020 1:21 PM   Suzanne Cantu [birth year] [id number]  Referring provider: Corky Downs, MD [address] [place],  [place] [address]  Chief Complaint  Patient presents with  . Urinary Frequency    HPI: I was consulted to assess the patient is urinary frequency.  She was getting up 6 or 7 times a night..  She was treated for a bladder infection gets up 3 times.  She will void a large amount and feels empty.  During the day she goes every 2 hours but only voids a small amount.  She is having more difficulty voiding.  She had a fractured femur in October and it sounds like she had 3 trials of voiding.  She wears a pull-up but her daughter says she is generally continent  She has a past history of a staghorn stone in part probably ureteroscopy as well and has been told she has residual stones.  She normally does not get a lot of bladder infections but has had some since the femur fracture.   I reviewed the medical record and her urine culture in October was positive.  I did not see a recent renal x-ray.    PMH: Past Medical History:  Diagnosis Date  . Diabetes mellitus without complication (HCC)   . Hypertension   . Stroke [facility])     Surgical History: Past Surgical History:  Procedure Laterality Date  . ABDOMINAL HYSTERECTOMY    . CESAREAN SECTION     4  . CHOLECYSTECTOMY    . INTRAMEDULLARY (IM) NAIL INTERTROCHANTERIC Right 04/24/2020   Procedure: INTRAMEDULLARY (IM) NAIL INTERTROCHANTRIC;  Surgeon: Juanell Fairly, MD;  Location: [facility] ORS;  Service: Orthopedics;  Laterality: Right;  . KIDNEY STONE SURGERY      Home Medications:  Allergies as of 08/16/2020      Reactions   Valproic Acid And Related Other (See Comments)   Low blood pressure, weakness.   Metaxalone Other (See Comments)   "It made me not feel right"   Peanut-containing Drug Products Other (See Comments)   Raises blood sugar      Medication List       Accurate as of August 16, 2020  1:21  PM. If you have any questions, ask your nurse or doctor.        acetaminophen 325 MG tablet Commonly known as: TYLENOL Take 1-2 tablets (325-650 mg total) by mouth every 6 (six) hours as needed for mild pain (pain score 1-3 or temp > 100.5).   ALPRAZolam 0.25 MG tablet Commonly known as: XANAX Take 1 tablet (0.25 mg total) by mouth daily.   amLODipine 5 MG tablet Commonly known as: NORVASC Take 1 tablet (5 mg total) by mouth daily.   apixaban 2.5 MG Tabs tablet Commonly known as: Eliquis Take 1 tablet (2.5 mg total) by mouth 2 (two) times daily.   ciprofloxacin 500 MG tablet Commonly known as: Cipro Take 1 tablet (500 mg total) by mouth 2 (two) times daily.   feeding supplement (GLUCERNA SHAKE) Liqd Take 237 mLs by mouth 3 (three) times daily between meals.   gabapentin 300 MG capsule Commonly known as: NEURONTIN Take 1 capsule (300 mg total) by mouth 2 (two) times daily.   ibuprofen 400 MG tablet Commonly known as: ADVIL Take 1 tablet (400 mg total) by mouth every 6 (six) hours as needed for mild pain or moderate pain.   insulin aspart 100 UNIT/ML injection Commonly known as: novoLOG Before each meal 3 times a day, 140-199 -  2 units, 200-250 - 4 units, 251-299 - 6 units,  300-349 - 8 units,  350 or above 10 units. Insulin PEN if approved, provide syringes and needles if needed.   insulin glargine 100 UNIT/ML injection Commonly known as: LANTUS Inject 0.1 mLs (10 Units total) into the skin daily.   Lumigan 0.01 % Soln Generic drug: bimatoprost Place 1 drop into both eyes at bedtime.   magic mouthwash Soln Take 5 mLs by mouth 3 (three) times daily as needed for mouth pain.   metFORMIN 500 MG tablet Commonly known as: GLUCOPHAGE Take 1 tablet (500 mg total) by mouth 2 (two) times daily with a meal.   PreserVision AREDS 2 Caps Take 1 capsule by mouth 2 (two) times daily.   senna-docusate 8.6-50 MG tablet Commonly known as: Senokot-S Take 1 tablet by mouth at  bedtime.   valsartan 40 MG tablet Commonly known as: DIOVAN TAKE 1 TABLET BY MOUTH EVERY DAY   VITAMIN D-3 PO Take 1,000 mg by mouth daily after supper.       Allergies:  Allergies  Allergen Reactions  . Valproic Acid And Related Other (See Comments)    Low blood pressure, weakness.  Remus Blake Other (See Comments)    "It made me not feel right"  . Peanut-Containing Drug Products Other (See Comments)    Raises blood sugar    Family History: Family History  Problem Relation Age of Onset  . Diabetes Mellitus II Sister     Social History:  reports that she has never smoked. She has never used smokeless tobacco. She reports that she does not drink alcohol and does not use drugs.  ROS:                                        Physical Exam: There were no vitals taken for this visit.  Constitutional:  Alert and oriented, No acute distress. HEENT: [place] AT, moist mucus membranes.  Trachea midline, no masses. Cardiovascular: No clubbing, cyanosis, or edema. Respiratory: Normal respiratory effort, no increased work of breathing. GI: Abdomen is soft, nontender, nondistended, no abdominal masses GU: No CVA tenderness.  Mobility Skin: No rashes, bruises or suspicious lesions. Lymph: No cervical or inguinal adenopathy. Neurologic: Grossly intact, no focal deficits, moving all 4 extremities. Psychiatric: Normal mood and affect.  Laboratory Data: Lab Results  Component Value Date   WBC 8.3 05/01/2020   HGB 9.4 (L) 05/01/2020   HCT 28.2 (L) 05/01/2020   MCV 93.4 05/01/2020   PLT 302 05/01/2020    Lab Results  Component Value Date   CREATININE 0.40 (L) 04/28/2020    No results found for: PSA  No results found for: TESTOSTERONE  Lab Results  Component Value Date   HGBA1C 7.3 (H) 04/21/2020    Urinalysis    Component Value Date/Time   COLORURINE YELLOW (A) 04/22/2020 1318   APPEARANCEUR HAZY (A) 04/22/2020 1318   LABSPEC 1.012 04/22/2020  1318   PHURINE 6.0 04/22/2020 1318   GLUCOSEU NEGATIVE 04/22/2020 1318   HGBUR NEGATIVE 04/22/2020 1318   BILIRUBINUR NEGATIVE 04/22/2020 1318   KETONESUR NEGATIVE 04/22/2020 1318   PROTEINUR NEGATIVE 04/22/2020 1318   NITRITE NEGATIVE 04/22/2020 1318   LEUKOCYTESUR MODERATE (A) 04/22/2020 1318    Pertinent Imaging: Urine reviewed.  Urine sent for culture.  Bladder scan just over 400 mL.  Assessment & Plan: Patient may or may not be  emptying well.  It may be a false positive.  Is difficult to know really how many bladder infections she may have had we discussed the role of prophylaxis.  We discussed the role of Flomax versus watchful waiting.  The role of renal ultrasound and rechecking a residual urine volume was discussed.  And is comfortable and doing reasonably well with conservative therapy  His daughter was very helpful.  We will get an ultrasound to recheck residual urine volume and for silent hydronephrosis.  We will call with the results.  Call if culture is positive.  Watchful waiting currently recommended.  There is not enough proof to start urinary prophylaxis at this point.  See patient as needed  1. Urinary frequency  - Urinalysis, Complete   No follow-ups on file.  Martina Sinner, MD  [facility] [address] [place], [place] [address] [phone]

## 2020-08-16 NOTE — Progress Notes (Signed)
Established Patient Office Visit  Subjective:  Patient ID: Suzanne Cantu, female    DOB: 1927/04/02  Age: 87 y.o. MRN: [id number]  CC:  Chief Complaint  Patient presents with  . Transient Ischemic Attack    Patient's daughter states that she is having more frequent TIAs and wants to discuss increasing eliquis     HPI  Suzanne Cantu presents for bp check , pt has confusion episode, twice , pt is taking eliquis, patient has a history of stroke she is also known to have diabetes without complication hypertension and hypertensive cardiovascular disease.  Her blood pressure pressure is labile.  Diabetes is under control.  Past Medical History:  Diagnosis Date  . Diabetes mellitus without complication (HCC)   . Hypertension   . Stroke [facility])     Past Surgical History:  Procedure Laterality Date  . ABDOMINAL HYSTERECTOMY    . CESAREAN SECTION     4  . CHOLECYSTECTOMY    . INTRAMEDULLARY (IM) NAIL INTERTROCHANTERIC Right 04/24/2020   Procedure: INTRAMEDULLARY (IM) NAIL INTERTROCHANTRIC;  Surgeon: Juanell Fairly, MD;  Location: [facility] ORS;  Service: Orthopedics;  Laterality: Right;  . KIDNEY STONE SURGERY      Family History  Problem Relation Age of Onset  . Diabetes Mellitus II Sister     Social History   Socioeconomic History  . Marital status: Widowed    Spouse name: Not on file  . Number of children: Not on file  . Years of education: Not on file  . Highest education level: Not on file  Occupational History  . Not on file  Tobacco Use  . Smoking status: Never Smoker  . Smokeless tobacco: Never Used  Substance and Sexual Activity  . Alcohol use: Never  . Drug use: Never  . Sexual activity: Not on file  Other Topics Concern  . Not on file  Social History Narrative  . Not on file   Social Determinants of Health   Financial Resource Strain: Not on file  Food Insecurity: Not on file  Transportation Needs: Not on file  Physical Activity: Not on file   Stress: Not on file  Social Connections: Not on file  Intimate Partner Violence: Not on file     Current Outpatient Medications:  .  acetaminophen (TYLENOL) 325 MG tablet, Take 1-2 tablets (325-650 mg total) by mouth every 6 (six) hours as needed for mild pain (pain score 1-3 or temp > 100.5)., Disp: , Rfl:  .  ALPRAZolam (XANAX) 0.25 MG tablet, Take 1 tablet (0.25 mg total) by mouth daily., Disp: 60 tablet, Rfl: 0 .  amLODipine (NORVASC) 5 MG tablet, Take 1 tablet (5 mg total) by mouth daily., Disp: , Rfl:  .  bimatoprost (LUMIGAN) 0.01 % SOLN, Place 1 drop into both eyes at bedtime., Disp: , Rfl:  .  Cholecalciferol (VITAMIN D-3 PO), Take 1,000 mg by mouth daily after supper. , Disp: , Rfl:  .  feeding supplement, GLUCERNA SHAKE, (GLUCERNA SHAKE) LIQD, Take 237 mLs by mouth 3 (three) times daily between meals., Disp: , Rfl: 0 .  insulin aspart (NOVOLOG) 100 UNIT/ML injection, Before each meal 3 times a day, 140-199 - 2 units, 200-250 - 4 units, 251-299 - 6 units,  300-349 - 8 units,  350 or above 10 units. Insulin PEN if approved, provide syringes and needles if needed. (Patient taking differently: Before each meal 3 times a day, 140-199 - 2 units, 200-250 - 4 units, 251-299 - 6 units,  300-349 - 8 units,  350 or above 10 units. Insulin PEN if approved, provide syringes and needles if needed.), Disp: 10 mL, Rfl: 0 .  insulin glargine (LANTUS) 100 UNIT/ML injection, Inject 0.1 mLs (10 Units total) into the skin daily., Disp: 10 mL, Rfl: 0 .  magic mouthwash SOLN, Take 5 mLs by mouth 3 (three) times daily as needed for mouth pain., Disp: , Rfl: 0 .  metFORMIN (GLUCOPHAGE) 500 MG tablet, Take 1 tablet (500 mg total) by mouth 2 (two) times daily with a meal., Disp: 180 tablet, Rfl: 3 .  Multiple Vitamins-Minerals (PRESERVISION AREDS 2) CAPS, Take 1 capsule by mouth 2 (two) times daily., Disp: , Rfl:  .  senna-docusate (SENOKOT-S) 8.6-50 MG tablet, Take 1 tablet by mouth at bedtime., Disp: , Rfl:   .  valsartan (DIOVAN) 40 MG tablet, TAKE 1 TABLET BY MOUTH EVERY DAY, Disp: 90 tablet, Rfl: 1 .  apixaban (ELIQUIS) 2.5 MG TABS tablet, Take 2 tablets (5 mg total) by mouth 2 (two) times daily., Disp: 60 tablet, Rfl: 6   Allergies  Allergen Reactions  . Valproic Acid And Related Other (See Comments)    Low blood pressure, weakness.  Remus Blake Other (See Comments)    "It made me not feel right"  . Peanut-Containing Drug Products Other (See Comments)    Raises blood sugar    ROS Review of Systems  Constitutional: Negative.   HENT: Negative.   Eyes: Negative.   Respiratory: Negative.  Negative for chest tightness.   Cardiovascular: Negative.  Negative for chest pain.  Gastrointestinal: Negative.  Negative for abdominal pain.  Endocrine: Negative.   Genitourinary: Positive for difficulty urinating (The bladder has been distended it was examined by urologist and was found to have 500 cc of urine) and enuresis.  Musculoskeletal: Negative.  Negative for back pain.  Skin: Negative.   Allergic/Immunologic: Negative.   Neurological: Negative.   Hematological: Negative.   Psychiatric/Behavioral: Negative.   All other systems reviewed and are negative.     Objective:    Physical Exam Vitals reviewed.  Constitutional:      Appearance: Normal appearance.  HENT:     Mouth/Throat:     Mouth: Mucous membranes are moist.  Eyes:     Pupils: Pupils are equal, round, and reactive to light.  Neck:     Vascular: No carotid bruit.  Cardiovascular:     Rate and Rhythm: Normal rate. Rhythm irregular.     Pulses: Normal pulses.     Heart sounds: Normal heart sounds.  Pulmonary:     Effort: Pulmonary effort is normal.     Breath sounds: Normal breath sounds. No wheezing.  Abdominal:     General: Bowel sounds are normal. There is no distension.     Palpations: Abdomen is soft. There is no hepatomegaly, splenomegaly or mass.     Tenderness: There is no abdominal tenderness.      Hernia: No hernia is present.  Musculoskeletal:        General: No tenderness.     Cervical back: Neck supple.     Right lower leg: No edema.     Left lower leg: No edema.  Skin:    Coloration: Skin is not pale.     Findings: No rash.  Neurological:     Mental Status: She is alert and oriented to person, place, and time.     Motor: No weakness.  Psychiatric:        Mood and Affect:  Mood and affect normal.        Behavior: Behavior normal.     BP (!) 171/89   Pulse 82   Ht 5' (1.524 m)   Wt 108 lb (49 kg)   BMI 21.09 kg/m  Wt Readings from Last 3 Encounters:  08/16/20 108 lb (49 kg)  08/16/20 108 lb (49 kg)  05/12/20 120 lb 6.4 oz (54.6 kg)     Health Maintenance Due  Topic Date Due  . COVID-19 Vaccine (1) Never done  . FOOT EXAM  Never done  . OPHTHALMOLOGY EXAM  Never done  . DEXA SCAN  Never done  . PNA vac Low Risk Adult (1 of 2 - PCV13) Never done  . INFLUENZA VACCINE  Never done    There are no preventive care reminders to display for this patient.  Lab Results  Component Value Date   TSH 2.468 08/01/2019   Lab Results  Component Value Date   WBC 8.3 05/01/2020   HGB 9.4 (L) 05/01/2020   HCT 28.2 (L) 05/01/2020   MCV 93.4 05/01/2020   PLT 302 05/01/2020   Lab Results  Component Value Date   NA 137 04/28/2020   K 3.7 04/28/2020   CO2 25 04/28/2020   GLUCOSE 196 (H) 04/28/2020   BUN 13 04/28/2020   CREATININE 0.40 (L) 04/28/2020   BILITOT 0.6 11/25/2019   ALKPHOS 57 11/25/2019   AST 21 11/25/2019   ALT 13 11/25/2019   PROT 6.3 (L) 11/25/2019   ALBUMIN 3.4 (L) 11/25/2019   CALCIUM 8.7 (L) 04/28/2020   ANIONGAP 8 04/28/2020    Lab Results  Component Value Date   TRIG 90 10/28/2019   Lab Results  Component Value Date   CHOLHDL 3.6 10/28/2019   Lab Results  Component Value Date   HGBA1C 7.3 (H) 04/21/2020      Assessment & Plan:   Problem List Items Addressed This Visit      Cardiovascular and Mediastinum   Essential  hypertension    Patient was advised to take her medicine regularly.  Her blood pressure was found to be high today she takes valsartan 40 mg p.o. daily and has taken the medicine today.      Relevant Medications   apixaban (ELIQUIS) 2.5 MG TABS tablet   TIA (transient ischemic attack)    Patient is a 2 episode of confusion suspicious for TIA so we will increase his Eliquis      Relevant Medications   apixaban (ELIQUIS) 2.5 MG TABS tablet   Paroxysmal atrial fibrillation (HCC)    Patient is in atrial fibrillation.  I increase his Eliquis to 5 mg p.o. twice a day because of some episode which is suggestive of TIA      Relevant Medications   apixaban (ELIQUIS) 2.5 MG TABS tablet   Other Relevant Orders   CBC with Differential/Platelet   COMPLETE METABOLIC PANEL WITH GFR     Endocrine   Controlled type 2 diabetes mellitus without complication, without long-term current use of insulin (HCC) - Primary    Blood sugar was checked today and it was under control she was not hypoglycemic.      Relevant Orders   POCT glucose (manual entry) (Completed)      Meds ordered this encounter  Medications  . apixaban (ELIQUIS) 2.5 MG TABS tablet    Sig: Take 2 tablets (5 mg total) by mouth 2 (two) times daily.    Dispense:  60 tablet    Refill:  6    Follow-up: No follow-ups on file.    Cletis Athens, MD

## 2020-08-17 NOTE — Addendum Note (Signed)
Addended by: Jobie Quaker on: 08/17/2020 02:51 PM   Modules accepted: Orders

## 2020-08-18 LAB — COMPLETE METABOLIC PANEL WITH GFR
AG Ratio: 1.6 (calc) (ref 1.0–2.5)
ALT: 9 U/L (ref 6–29)
AST: 14 U/L (ref 10–35)
Albumin: 4 g/dL (ref 3.6–5.1)
Alkaline phosphatase (APISO): 72 U/L (ref 37–153)
BUN: 14 mg/dL (ref 7–25)
CO2: 24 mmol/L (ref 20–32)
Calcium: 10.1 mg/dL (ref 8.6–10.4)
Chloride: 98 mmol/L (ref 98–110)
Creat: 0.72 mg/dL (ref 0.60–0.88)
GFR, Est African American: 85 mL/min/{1.73_m2} (ref >=60)
GFR, Est Non African American: 73 mL/min/{1.73_m2} (ref >=60)
Globulin: 2.5 g/dL (calc) (ref 1.9–3.7)
Glucose, Bld: 228 mg/dL — ABNORMAL HIGH (ref 65–99)
Potassium: 4.8 mmol/L (ref 3.5–5.3)
Sodium: 136 mmol/L (ref 135–146)
Total Bilirubin: 0.4 mg/dL (ref 0.2–1.2)
Total Protein: 6.5 g/dL (ref 6.1–8.1)

## 2020-08-18 LAB — CBC WITH DIFFERENTIAL/PLATELET
Absolute Monocytes: 454 cells/uL (ref 200–950)
Basophils Absolute: 18 cells/uL (ref 0–200)
Basophils Relative: 0.3 %
Eosinophils Absolute: 112 cells/uL (ref 15–500)
Eosinophils Relative: 1.9 %
HCT: 38.1 % (ref 35.0–45.0)
Hemoglobin: 12.9 g/dL (ref 11.7–15.5)
Lymphs Abs: 1221 cells/uL (ref 850–3900)
MCH: 29.7 pg (ref 27.0–33.0)
MCHC: 33.9 g/dL (ref 32.0–36.0)
MCV: 87.6 fL (ref 80.0–100.0)
MPV: 10.6 fL (ref 7.5–12.5)
Monocytes Relative: 7.7 %
Neutro Abs: 4095 cells/uL (ref 1500–7800)
Neutrophils Relative %: 69.4 %
Platelets: 269 10*3/uL (ref 140–400)
RBC: 4.35 10*6/uL (ref 3.80–5.10)
RDW: 13 % (ref 11.0–15.0)
Total Lymphocyte: 20.7 %
WBC: 5.9 10*3/uL (ref 3.8–10.8)

## 2020-08-20 LAB — CULTURE, URINE COMPREHENSIVE

## 2020-08-24 ENCOUNTER — Other Ambulatory Visit: Payer: Self-pay

## 2020-08-24 ENCOUNTER — Ambulatory Visit
Admission: RE | Admit: 2020-08-24 | Discharge: 2020-08-24 | Disposition: A | Discharge status: Home / Self Care | Payer: Medicare Other | Source: Ambulatory Visit | Attending: Urology | Admitting: Urology

## 2020-08-24 DIAGNOSIS — N1330 Unspecified hydronephrosis: Secondary | ICD-10-CM | POA: Diagnosis not present

## 2020-08-24 IMAGING — US US RENAL
1 series · 14 of 25 positions shown · non-contrast
Comparison: None.

CLINICAL DATA: Hydronephrosis and difficulty voiding. History of
staghorn calculus.

EXAM:
RENAL / URINARY TRACT ULTRASOUND COMPLETE

[Series 1: us renal · 14 of 36 slices shown]
[im 1/36]
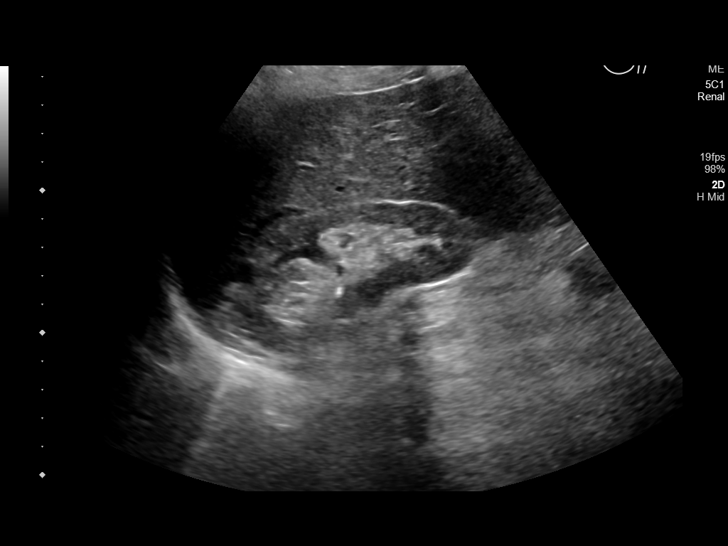
[im 3/36]
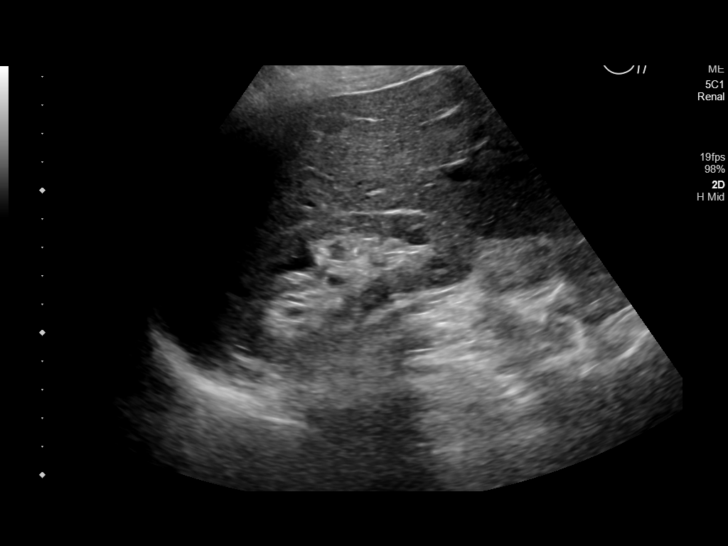
[im 6/36]
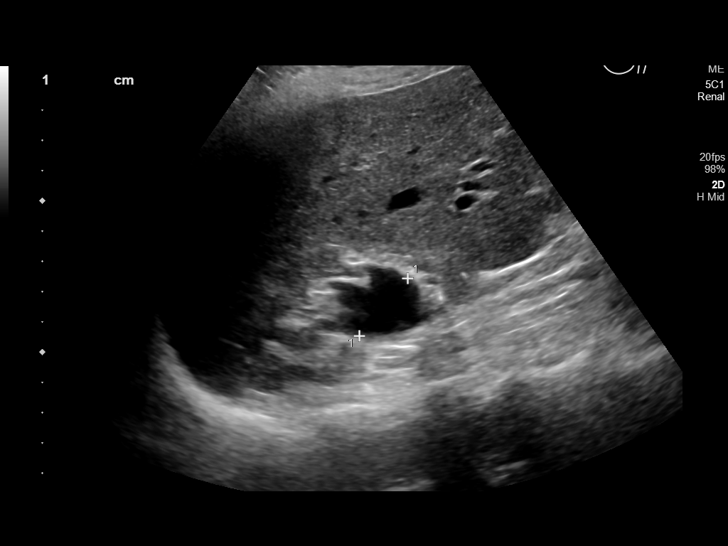
[im 9/36]
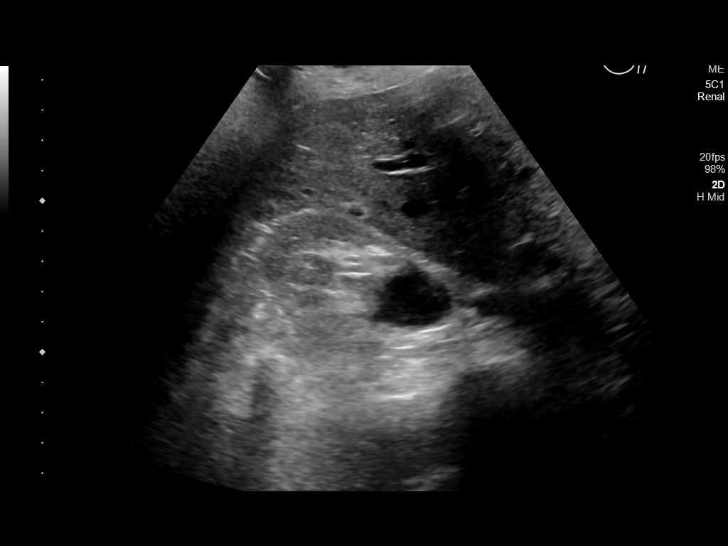
[im 12/36]
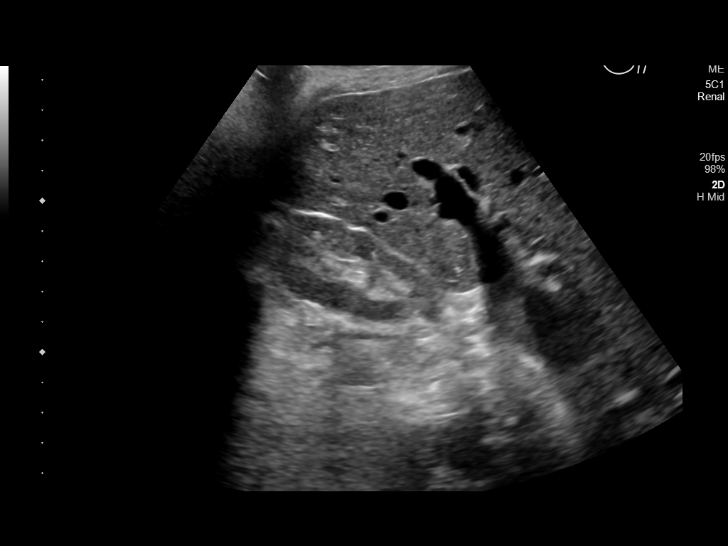
[im 14/36]
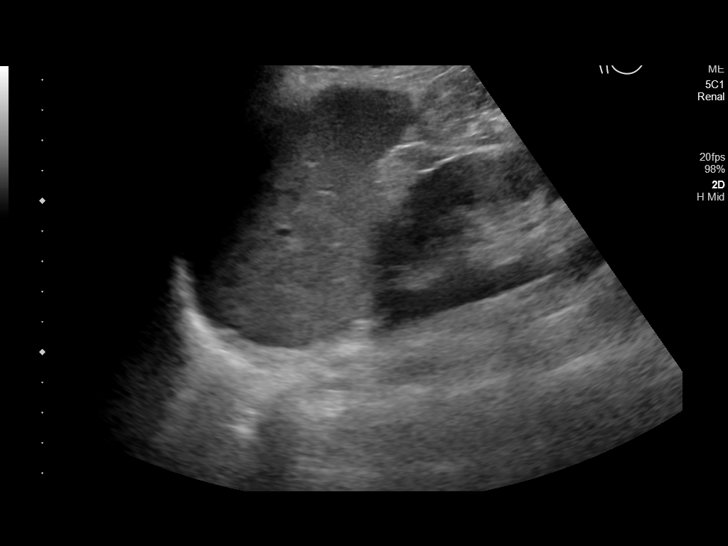
[im 17/36]
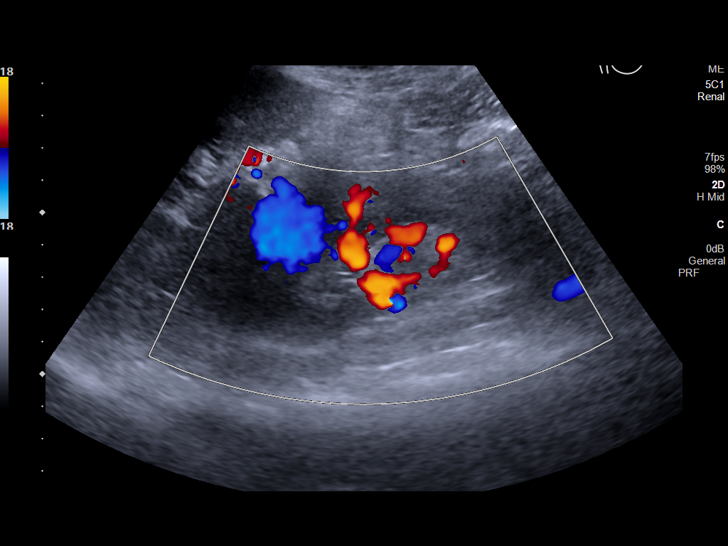
[im 19/36]
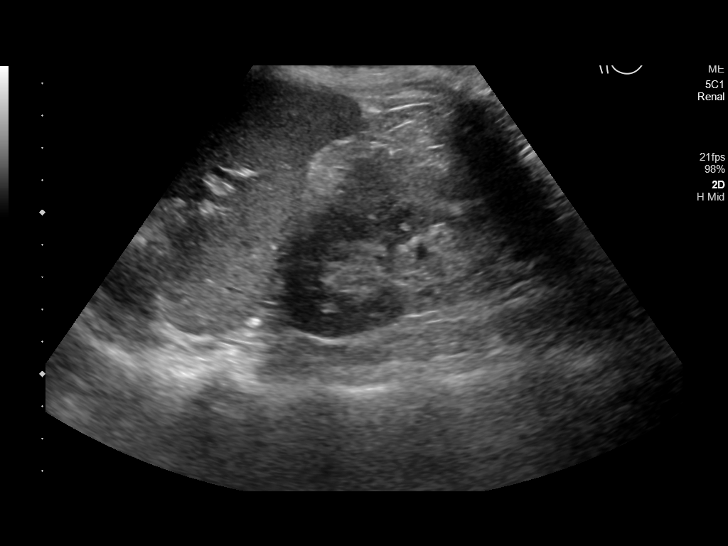
[im 22/36]
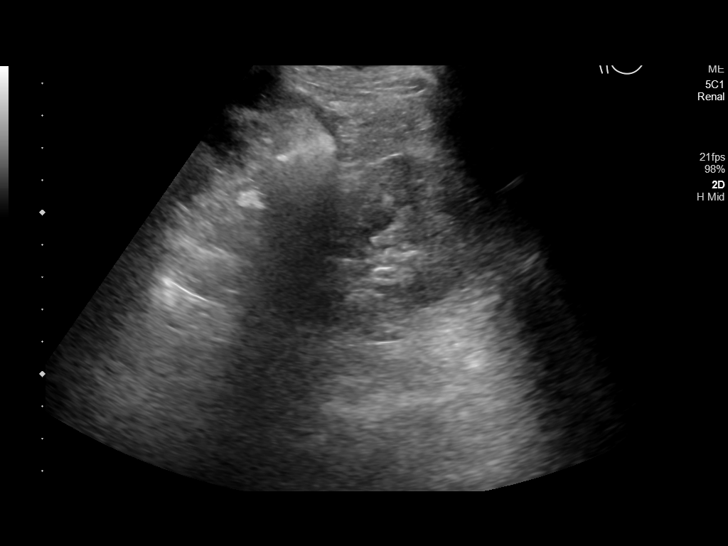
[im 24/36]
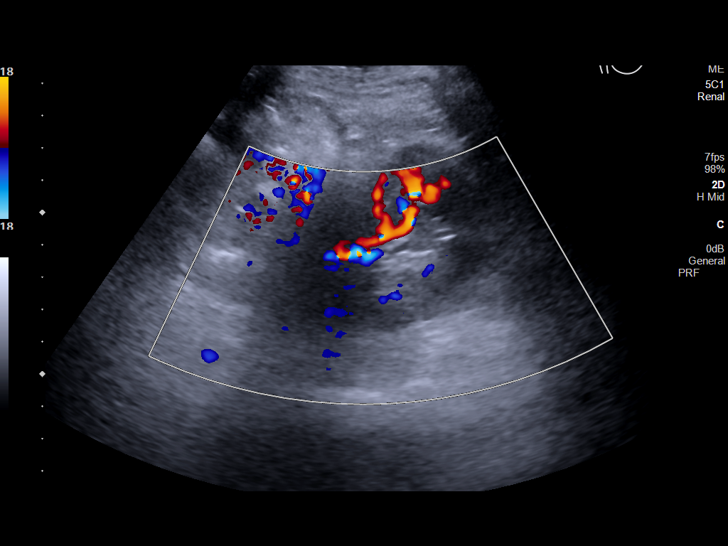
[im 27/36]
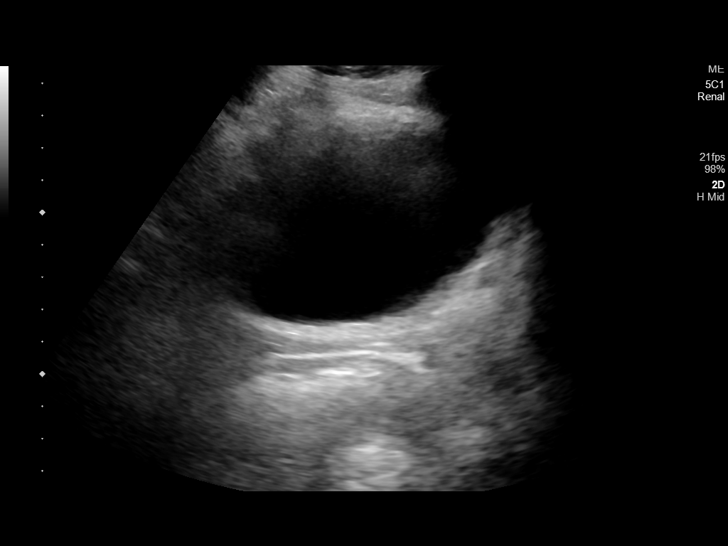
[im 30/36]
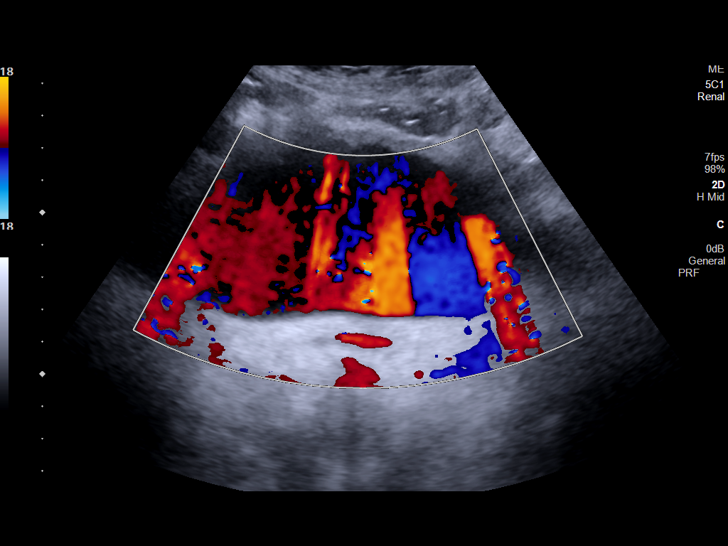
[im 33/36]
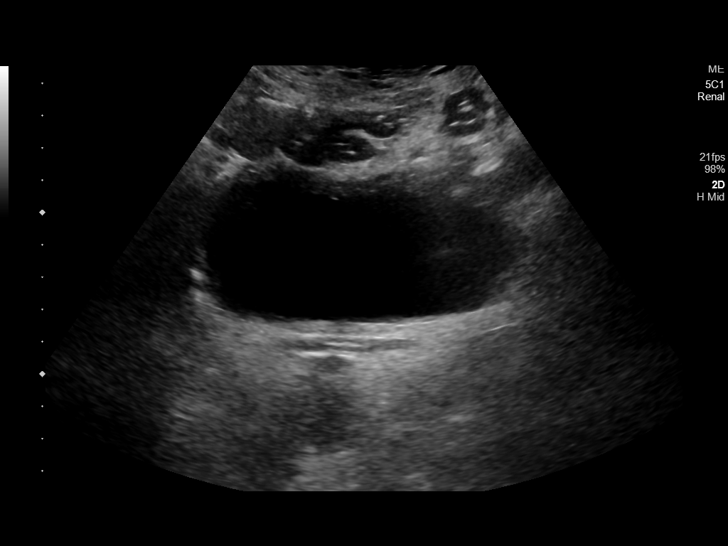
[im 36/36]
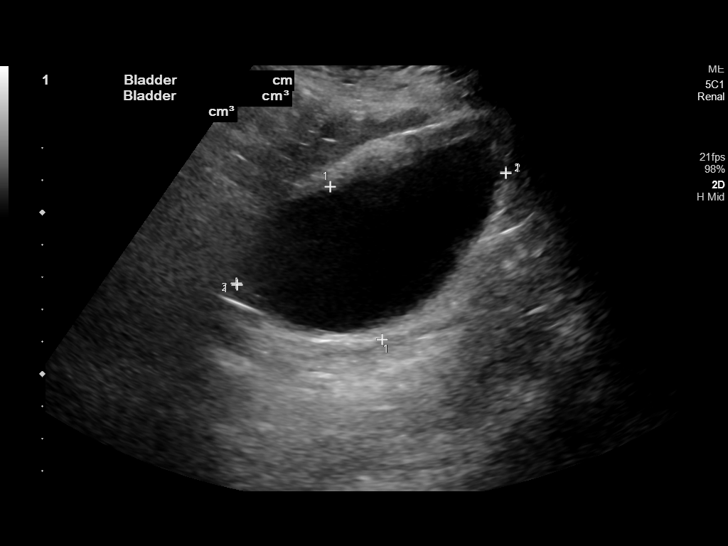

[14 of 25 positions shown; findings below may reference images not displayed]

FINDINGS: Right Kidney:

Renal measurements: 9.8 cm x 4.4 cm x 5.2 cm = volume: 116.6 mL.
Diffusely increased echogenicity of the renal parenchyma is seen. No
mass is visualized. There is mild right-sided hydronephrosis.

Left Kidney:

Renal measurements: 10.5 cm x 4.6 cm x 4.7 cm = volume: 118.9 mL.
Diffusely increased echogenicity of the renal parenchyma is seen. No
mass or hydronephrosis visualized.

Bladder:

Appears normal for degree of bladder distention.

Other:

None.
IMPRESSION: 1. Mild right-sided hydronephrosis.
2. Diffusely increased echogenicity of the renal parenchyma which
may be secondary to medical renal disease.

## 2020-08-27 ENCOUNTER — Telehealth: Payer: Self-pay | Admitting: Adult Health Nurse Practitioner

## 2020-08-27 NOTE — Telephone Encounter (Signed)
Spoke with patient's daughter, Eino Farber, to see if we could change the time of the 09/07/20 Palliative f/u visit from 10 AM to 11:30 AM and she was in agreement with this.

## 2020-08-30 ENCOUNTER — Other Ambulatory Visit: Payer: Self-pay

## 2020-08-30 ENCOUNTER — Encounter: Payer: Self-pay | Admitting: Internal Medicine

## 2020-08-30 ENCOUNTER — Ambulatory Visit (INDEPENDENT_AMBULATORY_CARE_PROVIDER_SITE_OTHER): Payer: Medicare Other | Admitting: Internal Medicine

## 2020-08-30 VITALS — BP 140/85 | HR 94 | Ht 60.0 in | Wt 111.2 lb

## 2020-08-30 DIAGNOSIS — S72001S Fracture of unspecified part of neck of right femur, sequela: Secondary | ICD-10-CM

## 2020-08-30 DIAGNOSIS — G459 Transient cerebral ischemic attack, unspecified: Secondary | ICD-10-CM

## 2020-08-30 DIAGNOSIS — E119 Type 2 diabetes mellitus without complications: Secondary | ICD-10-CM

## 2020-08-30 DIAGNOSIS — Z8673 Personal history of transient ischemic attack (TIA), and cerebral infarction without residual deficits: Secondary | ICD-10-CM

## 2020-08-30 DIAGNOSIS — I482 Chronic atrial fibrillation, unspecified: Secondary | ICD-10-CM

## 2020-08-30 LAB — GLUCOSE, POCT (MANUAL RESULT ENTRY): POC Glucose: 165 mg/dl — AB (ref 70–99)

## 2020-08-30 NOTE — Assessment & Plan Note (Signed)
Patient is using a walker, she is able to get up by herself.

## 2020-08-30 NOTE — Assessment & Plan Note (Signed)
Patient is tolerating Eliquis well 

## 2020-08-30 NOTE — Assessment & Plan Note (Signed)
Blood sugar is 165 after eating today

## 2020-08-30 NOTE — Progress Notes (Signed)
Established Patient Office Visit  Subjective:  Patient ID: Suzanne Cantu, female    DOB: [birth year]  Age: 87 y.o. MRN: [id number]  CC:  Chief Complaint  Patient presents with  . Diabetes    HPI  Suzanne Cantu presents for check up  , she is on eliquis  5 mg po bid/ bs   Is 165  Past Medical History:  Diagnosis Date  . Diabetes mellitus without complication (HCC)   . Hypertension   . Stroke [facility])     Past Surgical History:  Procedure Laterality Date  . ABDOMINAL HYSTERECTOMY    . CESAREAN SECTION     4  . CHOLECYSTECTOMY    . INTRAMEDULLARY (IM) NAIL INTERTROCHANTERIC Right 04/24/2020   Procedure: INTRAMEDULLARY (IM) NAIL INTERTROCHANTRIC;  Surgeon: Juanell Fairly, MD;  Location: [facility] ORS;  Service: Orthopedics;  Laterality: Right;  . KIDNEY STONE SURGERY      Family History  Problem Relation Age of Onset  . Diabetes Mellitus II Sister     Social History   Socioeconomic History  . Marital status: Widowed    Spouse name: Not on file  . Number of children: Not on file  . Years of education: Not on file  . Highest education level: Not on file  Occupational History  . Not on file  Tobacco Use  . Smoking status: Never Smoker  . Smokeless tobacco: Never Used  Substance and Sexual Activity  . Alcohol use: Never  . Drug use: Never  . Sexual activity: Not on file  Other Topics Concern  . Not on file  Social History Narrative  . Not on file   Social Determinants of Health   Financial Resource Strain: Not on file  Food Insecurity: Not on file  Transportation Needs: Not on file  Physical Activity: Not on file  Stress: Not on file  Social Connections: Not on file  Intimate Partner Violence: Not on file     Current Outpatient Medications:  .  acetaminophen (TYLENOL) 325 MG tablet, Take 1-2 tablets (325-650 mg total) by mouth every 6 (six) hours as needed for mild pain (pain score 1-3 or temp > 100.5)., Disp: , Rfl:  .  ALPRAZolam (XANAX) 0.25  MG tablet, Take 1 tablet (0.25 mg total) by mouth daily., Disp: 60 tablet, Rfl: 0 .  amLODipine (NORVASC) 5 MG tablet, Take 1 tablet (5 mg total) by mouth daily., Disp: , Rfl:  .  apixaban (ELIQUIS) 2.5 MG TABS tablet, Take 2 tablets (5 mg total) by mouth 2 (two) times daily., Disp: 60 tablet, Rfl: 6 .  bimatoprost (LUMIGAN) 0.01 % SOLN, Place 1 drop into both eyes at bedtime., Disp: , Rfl:  .  Cholecalciferol (VITAMIN D-3 PO), Take 1,000 mg by mouth daily after supper. , Disp: , Rfl:  .  feeding supplement, GLUCERNA SHAKE, (GLUCERNA SHAKE) LIQD, Take 237 mLs by mouth 3 (three) times daily between meals., Disp: , Rfl: 0 .  insulin aspart (NOVOLOG) 100 UNIT/ML injection, Before each meal 3 times a day, 140-199 - 2 units, 200-250 - 4 units, 251-299 - 6 units,  300-349 - 8 units,  350 or above 10 units. Insulin PEN if approved, provide syringes and needles if needed. (Patient taking differently: Before each meal 3 times a day, 140-199 - 2 units, 200-250 - 4 units, 251-299 - 6 units,  300-349 - 8 units,  350 or above 10 units. Insulin PEN if approved, provide syringes and needles if needed.), Disp: 10 mL, Rfl:  0 .  insulin glargine (LANTUS) 100 UNIT/ML injection, Inject 0.1 mLs (10 Units total) into the skin daily., Disp: 10 mL, Rfl: 0 .  magic mouthwash SOLN, Take 5 mLs by mouth 3 (three) times daily as needed for mouth pain., Disp: , Rfl: 0 .  metFORMIN (GLUCOPHAGE) 500 MG tablet, Take 1 tablet (500 mg total) by mouth 2 (two) times daily with a meal., Disp: 180 tablet, Rfl: 3 .  Multiple Vitamins-Minerals (PRESERVISION AREDS 2) CAPS, Take 1 capsule by mouth 2 (two) times daily., Disp: , Rfl:  .  senna-docusate (SENOKOT-S) 8.6-50 MG tablet, Take 1 tablet by mouth at bedtime., Disp: , Rfl:  .  valsartan (DIOVAN) 40 MG tablet, TAKE 1 TABLET BY MOUTH EVERY DAY, Disp: 90 tablet, Rfl: 1   Allergies  Allergen Reactions  . Valproic Acid And Related Other (See Comments)    Low blood pressure, weakness.  Remus Blake Other (See Comments)    "It made me not feel right"  . Peanut-Containing Drug Products Other (See Comments)    Raises blood sugar    ROS Review of Systems  Constitutional: Negative.   HENT: Negative.   Musculoskeletal:       Gait  unsteady  Neurological: Positive for dizziness. Negative for headaches.  Hematological: Negative.   Psychiatric/Behavioral: Negative.       Objective:    Physical Exam Vitals reviewed.  Constitutional:      Appearance: Normal appearance.  HENT:     Mouth/Throat:     Mouth: Mucous membranes are moist.  Eyes:     Pupils: Pupils are equal, round, and reactive to light.  Neck:     Vascular: No carotid bruit.  Cardiovascular:     Rate and Rhythm: Regular rhythm. Tachycardia present.     Pulses: Normal pulses.     Heart sounds: Normal heart sounds.  Pulmonary:     Effort: Pulmonary effort is normal.     Breath sounds: Normal breath sounds.  Abdominal:     General: Bowel sounds are normal.     Palpations: Abdomen is soft. There is no hepatomegaly, splenomegaly or mass.     Tenderness: There is no abdominal tenderness. There is no guarding.     Hernia: No hernia is present.  Musculoskeletal:        General: No tenderness.     Cervical back: Neck supple.     Right lower leg: No edema.     Left lower leg: No edema.  Skin:    Findings: No rash.  Neurological:     General: No focal deficit present.     Mental Status: She is alert. She is disoriented.     Motor: No weakness.     Coordination: Coordination abnormal.     Gait: Gait abnormal.  Psychiatric:        Mood and Affect: Mood and affect normal.        Behavior: Behavior normal.     BP 140/85   Pulse 94   Ht 5' (1.524 m)   Wt 111 lb 3.2 oz (50.4 kg)   BMI 21.72 kg/m  Wt Readings from Last 3 Encounters:  08/30/20 111 lb 3.2 oz (50.4 kg)  08/16/20 108 lb (49 kg)  08/16/20 108 lb (49 kg)     Health Maintenance Due  Topic Date Due  . COVID-19 Vaccine (1) Never done   . FOOT EXAM  Never done  . OPHTHALMOLOGY EXAM  Never done  . DEXA SCAN  Never  done  . PNA vac Low Risk Adult (1 of 2 - PCV13) Never done  . INFLUENZA VACCINE  Never done    There are no preventive care reminders to display for this patient.  Lab Results  Component Value Date   TSH 2.468 08/01/2019   Lab Results  Component Value Date   WBC 5.9 08/17/2020   HGB 12.9 08/17/2020   HCT 38.1 08/17/2020   MCV 87.6 08/17/2020   PLT 269 08/17/2020   Lab Results  Component Value Date   NA 136 08/17/2020   K 4.8 08/17/2020   CO2 24 08/17/2020   GLUCOSE 228 (H) 08/17/2020   BUN 14 08/17/2020   CREATININE 0.72 08/17/2020   BILITOT 0.4 08/17/2020   ALKPHOS 57 11/25/2019   AST 14 08/17/2020   ALT 9 08/17/2020   PROT 6.5 08/17/2020   ALBUMIN 3.4 (L) 11/25/2019   CALCIUM 10.1 08/17/2020   ANIONGAP 8 04/28/2020   Lab Results  Component Value Date   CHOL 185 10/28/2019   Lab Results  Component Value Date   HDL 51 10/28/2019   Lab Results  Component Value Date   LDLCALC 116 (H) 10/28/2019   Lab Results  Component Value Date   TRIG 90 10/28/2019   Lab Results  Component Value Date   CHOLHDL 3.6 10/28/2019   Lab Results  Component Value Date   HGBA1C 7.3 (H) 04/21/2020      Assessment & Plan:   Problem List Items Addressed This Visit      Cardiovascular and Mediastinum   Atrial fibrillation, chronic (HCC)    Patient is on chronic anticoagulation.      TIA (transient ischemic attack)    Patient is tolerating Eliquis well.        Endocrine   Controlled type 2 diabetes mellitus without complication, without long-term current use of insulin (HCC) - Primary    Blood sugar is 165 after eating today      Relevant Orders   POCT glucose (manual entry) (Completed)     Musculoskeletal and Integument   Closed right hip fracture [facility])    Patient is using a walker, she is able to get up by herself.         No orders of the defined types were placed in this  encounter.   Follow-up: No follow-ups on file.    Corky Downs, MD

## 2020-08-30 NOTE — Assessment & Plan Note (Signed)
Patient is on chronic anticoagulation 

## 2020-09-01 ENCOUNTER — Telehealth: Payer: Self-pay

## 2020-09-01 NOTE — Telephone Encounter (Signed)
Patient daughter has called showing concern about the patients PVR which was 499. I made sure the patient was not in retention and she is able to void. She wanted to know what she should do.

## 2020-09-01 NOTE — Telephone Encounter (Signed)
Make f/up appointment with me with a PVR- OK to add on

## 2020-09-02 ENCOUNTER — Telehealth: Payer: Self-pay | Admitting: *Deleted

## 2020-09-02 ENCOUNTER — Other Ambulatory Visit: Payer: Self-pay | Admitting: *Deleted

## 2020-09-02 ENCOUNTER — Other Ambulatory Visit: Payer: Self-pay

## 2020-09-02 DIAGNOSIS — U071 COVID-19: Secondary | ICD-10-CM

## 2020-09-02 NOTE — Telephone Encounter (Signed)
Called pt daughter states pt has covid and will call us back to schedule appt.

## 2020-09-02 NOTE — Telephone Encounter (Signed)
Patient positive Covid by [facility]. Symptoms began 08/31/20 in the afternoon including cough and fatigue. Has multiple co morbidities .Referral placed today by Dr.Masoud to the infusion clinic per chart. Provided Covid Infusion Clinic # and advised her to leave #, name. Informed the daughter, Eino Farber she would receive a call from the clinic after leaving a message or tomorrow morning.

## 2020-09-03 ENCOUNTER — Encounter: Payer: Self-pay | Admitting: Oncology

## 2020-09-03 ENCOUNTER — Other Ambulatory Visit: Payer: Self-pay | Admitting: Oncology

## 2020-09-03 DIAGNOSIS — U071 COVID-19: Secondary | ICD-10-CM

## 2020-09-03 MED ORDER — MOLNUPIRAVIR EUA 200MG CAPSULE: 4.0000 | ORAL_CAPSULE | Freq: Two times a day (BID) | ORAL | 0 refills | Status: AC

## 2020-09-03 NOTE — Progress Notes (Signed)
I connected by phone with Suzanne Cantu on 09/03/2020 at 12:13 PM to discuss the potential use of a new treatment for mild to moderate COVID-19 viral infection in non-hospitalized patients.  This Suzanne Cantu is a 87 y.o. female that meets the FDA criteria for Emergency Use Authorization of COVID monoclonal antibody sotrovimab.  Has a (+) direct SARS-CoV-2 viral test result  Has mild or moderate COVID-19   Is NOT hospitalized due to COVID-19  Is within 10 days of symptom onset  Has at least one of the high risk factor(s) for progression to severe COVID-19 and/or hospitalization as defined in EUA.  Specific high risk criteria : Older age (>/= 85 yo) and Cardiovascular disease or hypertension   I have spoken and communicated the following to the Suzanne Cantu or parent/caregiver regarding COVID monoclonal antibody treatment:  1. FDA has authorized the emergency use for the treatment of mild to moderate COVID-19 in adults and pediatric patients with positive results of direct SARS-CoV-2 viral testing who are 85 years of age and older weighing at least 40 kg, and who are at high risk for progressing to severe COVID-19 and/or hospitalization.  2. The significant known and potential risks and benefits of COVID monoclonal antibody, and the extent to which such potential risks and benefits are unknown.  3. Information on available alternative treatments and the risks and benefits of those alternatives, including clinical trials.  4. Patients treated with COVID monoclonal antibody should continue to self-isolate and use infection control measures (e.g., wear mask, isolate, social distance, avoid sharing personal items, clean and disinfect "high touch" surfaces, and frequent handwashing) according to CDC guidelines.   5. The Suzanne Cantu or parent/caregiver has the option to accept or refuse COVID monoclonal antibody treatment.  After reviewing this information with the Suzanne Cantu, the Suzanne Cantu has agreed to  receive one of the available covid 19 monoclonal antibodies and will be provided an appropriate fact sheet prior to infusion. Mauro KaufmannJennifer E Maxon Kresse, NP 09/03/2020 12:13 PM  Outpatient Oral COVID Treatment Note  I connected with Suzanne Cantu on 09/03/2020/12:41 PM by telephone and verified that I am speaking with the correct person using two identifiers.  I discussed the limitations, risks, security, and privacy concerns of performing an evaluation and management service by telephone and the availability of in person appointments. I also discussed with the Suzanne Cantu that there may be a Suzanne Cantu responsible charge related to this service. The Suzanne Cantu expressed understanding and agreed to proceed.  Suzanne Cantu location: Home Provider location: Clinic   Diagnosis: COVID-19 infection  Purpose of visit: Discussion of potential use of Molnupiravir or Paxlovid, a new treatment for mild to moderate COVID-19 viral infection in non-hospitalized patients.   Subjective: Suzanne Cantu is a 87 y.o. female who has been diagnosed with COVID 19 viral infection.  Their symptoms began on 08/31/20 with cough  Past Medical History:  Diagnosis Date  . Diabetes mellitus without complication (HCC)   . Hypertension   . Stroke [facility](HCC)     Allergies  Allergen Reactions  . Valproic Acid And Related Other (See Comments)    Low blood pressure, weakness.  Remus Blake. Metaxalone Other (See Comments)    "It made me not feel right"  . Peanut-Containing Drug Products Other (See Comments)    Raises blood sugar     Current Outpatient Medications:  .  acetaminophen (TYLENOL) 325 MG tablet, Take 1-2 tablets (325-650 mg total) by mouth every 6 (six) hours as needed for mild pain (pain score 1-3 or temp > 100.5).,  Disp: , Rfl:  .  ALPRAZolam (XANAX) 0.25 MG tablet, Take 1 tablet (0.25 mg total) by mouth daily., Disp: 60 tablet, Rfl: 0 .  amLODipine (NORVASC) 5 MG tablet, Take 1 tablet (5 mg total) by mouth daily., Disp: , Rfl:  .  apixaban  (ELIQUIS) 2.5 MG TABS tablet, Take 2 tablets (5 mg total) by mouth 2 (two) times daily., Disp: 60 tablet, Rfl: 6 .  bimatoprost (LUMIGAN) 0.01 % SOLN, Place 1 drop into both eyes at bedtime., Disp: , Rfl:  .  Cholecalciferol (VITAMIN D-3 PO), Take 1,000 mg by mouth daily after supper. , Disp: , Rfl:  .  feeding supplement, GLUCERNA SHAKE, (GLUCERNA SHAKE) LIQD, Take 237 mLs by mouth 3 (three) times daily between meals., Disp: , Rfl: 0 .  insulin aspart (NOVOLOG) 100 UNIT/ML injection, Before each meal 3 times a day, 140-199 - 2 units, 200-250 - 4 units, 251-299 - 6 units,  300-349 - 8 units,  350 or above 10 units. Insulin PEN if approved, provide syringes and needles if needed. (Suzanne Cantu taking differently: Before each meal 3 times a day, 140-199 - 2 units, 200-250 - 4 units, 251-299 - 6 units,  300-349 - 8 units,  350 or above 10 units. Insulin PEN if approved, provide syringes and needles if needed.), Disp: 10 mL, Rfl: 0 .  insulin glargine (LANTUS) 100 UNIT/ML injection, Inject 0.1 mLs (10 Units total) into the skin daily., Disp: 10 mL, Rfl: 0 .  magic mouthwash SOLN, Take 5 mLs by mouth 3 (three) times daily as needed for mouth pain., Disp: , Rfl: 0 .  metFORMIN (GLUCOPHAGE) 500 MG tablet, Take 1 tablet (500 mg total) by mouth 2 (two) times daily with a meal., Disp: 180 tablet, Rfl: 3 .  Multiple Vitamins-Minerals (PRESERVISION AREDS 2) CAPS, Take 1 capsule by mouth 2 (two) times daily., Disp: , Rfl:  .  senna-docusate (SENOKOT-S) 8.6-50 MG tablet, Take 1 tablet by mouth at bedtime., Disp: , Rfl:  .  valsartan (DIOVAN) 40 MG tablet, TAKE 1 TABLET BY MOUTH EVERY DAY, Disp: 90 tablet, Rfl: 1  Objective: Suzanne Cantu sounds well.  They are in no apparent distress.  Breathing is non labored.  Mood and behavior are normal.  Laboratory Data:  Recent Results (from the past 2160 hour(s))  Urinalysis, Complete     Status: None   Collection Time: 08/16/20  1:24 PM  Result Value Ref Range   Specific Gravity,  UA 1.015 1.005 - 1.030   pH, UA 6.0 5.0 - 7.5   Color, UA Yellow Yellow   Appearance Ur Clear Clear   Leukocytes,UA Negative Negative   Protein,UA Negative    Glucose, UA Negative Negative   Ketones, UA Negative Negative   RBC, UA Negative Negative   Bilirubin, UA Negative Negative   Urobilinogen, Ur 0.2 0.2 - 1.0 mg/dL   Nitrite, UA Negative Negative   Microscopic Examination See below:   Microscopic Examination     Status: Abnormal   Collection Time: 08/16/20  1:24 PM   Urine  Result Value Ref Range   WBC, UA 0-5 0 - 5 /hpf   RBC 0-2 0 - 2 /hpf   Epithelial Cells (non renal) 0-10 0 - 10 /hpf   Renal Epithel, UA 0-10 (A) None seen /hpf   Crystals Present (A) N/A   Crystal Type Calcium Oxalate N/A   Bacteria, UA Few None seen/Few  Bladder Scan (Post Void Residual) in office     Status: None  Collection Time: 08/16/20  1:34 PM  Result Value Ref Range   Scan Result   CULTURE, URINE COMPREHENSIVE     Status: None   Collection Time: 08/16/20  1:53 PM   Specimen: Urine   UR  Result Value Ref Range   Urine Culture, Comprehensive Final report    Organism ID, Bacteria Comment     Comment: Mixed urogenital flora 10,000-25,000 colony forming units per mL   POCT glucose (manual entry)     Status: Abnormal   Collection Time: 08/16/20  3:45 PM  Result Value Ref Range   POC Glucose 226 (A) 70 - 99 mg/dl  CBC with Differential/Platelet     Status: None   Collection Time: 08/17/20  4:17 PM  Result Value Ref Range   WBC 5.9 3.8 - 10.8 Thousand/uL   RBC 4.35 3.80 - 5.10 Million/uL   Hemoglobin 12.9 11.7 - 15.5 g/dL   HCT 83.6 62.9 - 47.6 %   MCV 87.6 80.0 - 100.0 fL   MCH 29.7 27.0 - 33.0 pg   MCHC 33.9 32.0 - 36.0 g/dL   RDW 54.6 50.3 - 54.6 %   Platelets 269 140 - 400 Thousand/uL   MPV 10.6 7.5 - 12.5 fL   Neutro Abs 4,095 1,500 - 7,800 cells/uL   Lymphs Abs 1,221 850 - 3,900 cells/uL   Absolute Monocytes 454 200 - 950 cells/uL   Eosinophils Absolute 112 15 - 500  cells/uL   Basophils Absolute 18 0 - 200 cells/uL   Neutrophils Relative % 69.4 %   Total Lymphocyte 20.7 %   Monocytes Relative 7.7 %   Eosinophils Relative 1.9 %   Basophils Relative 0.3 %  COMPLETE METABOLIC PANEL WITH GFR     Status: Abnormal   Collection Time: 08/17/20  4:17 PM  Result Value Ref Range   Glucose, Bld 228 (H) 65 - 99 mg/dL    Comment: .            Fasting reference interval . For someone without known diabetes, a glucose value >125 mg/dL indicates that they may have diabetes and this should be confirmed with a follow-up test. .    BUN 14 7 - 25 mg/dL   Creat 5.68 1.27 - 5.17 mg/dL    Comment: For patients >68 years of age, the reference limit for Creatinine is approximately 13% higher for people identified as African-American. .    GFR, Est Non African American 73 > OR = 60 mL/min/1.74m2   GFR, Est African American 85 > OR = 60 mL/min/1.108m2   BUN/Creatinine Ratio NOT APPLICABLE 6 - 22 (calc)   Sodium 136 135 - 146 mmol/L   Potassium 4.8 3.5 - 5.3 mmol/L   Chloride 98 98 - 110 mmol/L   CO2 24 20 - 32 mmol/L   Calcium 10.1 8.6 - 10.4 mg/dL   Total Protein 6.5 6.1 - 8.1 g/dL   Albumin 4.0 3.6 - 5.1 g/dL   Globulin 2.5 1.9 - 3.7 g/dL (calc)   AG Ratio 1.6 1.0 - 2.5 (calc)   Total Bilirubin 0.4 0.2 - 1.2 mg/dL   Alkaline phosphatase (APISO) 72 37 - 153 U/L   AST 14 10 - 35 U/L   ALT 9 6 - 29 U/L  POCT glucose (manual entry)     Status: Abnormal   Collection Time: 08/30/20  4:17 PM  Result Value Ref Range   POC Glucose 165 (A) 70 - 99 mg/dl     Assessment: 87 y.o.  female with mild/moderate COVID 19 viral infection diagnosed on 09/01/20 at high risk for progression to severe COVID 19.  Plan:  This Suzanne Cantu is a 87 y.o. female that meets the following criteria for Emergency Use Authorization of: Molnupiravir  1. Age >18 yr 2. SARS-COV-2 positive test 3. Symptom onset < 5 days 4. Mild-to-moderate COVID disease with high risk for severe progression to  hospitalization or death   I have spoken and communicated the following to the Suzanne Cantu or parent/caregiver regarding: 1. Molnupiravir is an unapproved drug that is authorized for use under an TEFL teacher.  2. There are no adequate, approved, available products for the treatment of COVID-19 in adults who have mild-to-moderate COVID-19 and are at high risk for progressing to severe COVID-19, including hospitalization or death. 3. Other therapeutics are currently authorized. For additional information on all products authorized for treatment or prevention of COVID-19, please see https://www.graham-miller.com/.  4. There are benefits and risks of taking this treatment as outlined in the "Fact Sheet for Patients and Caregivers."  5. "Fact Sheet for Patients and Caregivers" was reviewed with Suzanne Cantu. A hard copy will be provided to Suzanne Cantu from pharmacy prior to the Suzanne Cantu receiving treatment. 6. Patients should continue to self-isolate and use infection control measures (e.g., wear mask, isolate, social distance, avoid sharing personal items, clean and disinfect "high touch" surfaces, and frequent handwashing) according to CDC guidelines.  7. The Suzanne Cantu or parent/caregiver has the option to accept or refuse treatment. 8. [employer] has established a pregnancy surveillance program. 9. Females of childbearing potential should use a reliable method of contraception correctly and consistently, as applicable, for the duration of treatment and for 4 days after the last dose of Molnupiravir. 10. Males of reproductive potential who are sexually active with females of childbearing potential should use a reliable method of contraception correctly and consistently during treatment and for at least 3 months after the last dose. 11. Pregnancy status and risk was assessed. Suzanne Cantu verbalized  understanding of precautions.   After reviewing above information with the Suzanne Cantu, the Suzanne Cantu agrees to receive molnupiravir.  Follow up instructions:    . Take prescription BID x 5 days as directed . Reach out to pharmacist for counseling on medication if desired . For concerns regarding further COVID symptoms please follow up with your PCP or urgent care . For urgent or life-threatening issues, seek care at your local emergency department  The Suzanne Cantu was provided an opportunity to ask questions, and all were answered. The Suzanne Cantu agreed with the plan and demonstrated an understanding of the instructions.   Script sent to [facility] and opted to pick up RX.  The Suzanne Cantu was advised to call their PCP or seek an in-person evaluation if the symptoms worsen or if the condition fails to improve as anticipated.   I provided 15 minutes of non face-to-face telephone visit time during this encounter, and > 50% was spent counseling as documented under my assessment & plan.  Mauro Kaufmann, NP 09/03/2020 /12:41 PM

## 2020-09-04 ENCOUNTER — Ambulatory Visit (HOSPITAL_COMMUNITY)
Admission: RE | Admit: 2020-09-04 | Discharge: 2020-09-04 | Disposition: A | Discharge status: Home / Self Care | Payer: Medicare Other | Source: Ambulatory Visit | Attending: Pulmonary Disease | Admitting: Pulmonary Disease

## 2020-09-04 DIAGNOSIS — U071 COVID-19: Secondary | ICD-10-CM | POA: Diagnosis not present

## 2020-09-04 MED ORDER — FAMOTIDINE IN NACL 20-0.9 MG/50ML-% IV SOLN: 20.0000 mg | Freq: Once | INTRAVENOUS | Status: DC | PRN

## 2020-09-04 MED ORDER — EPINEPHRINE 0.3 MG/0.3ML IJ SOAJ: 0.3000 mg | Freq: Once | INTRAMUSCULAR | Status: DC | PRN

## 2020-09-04 MED ORDER — SODIUM CHLORIDE 0.9 % IV SOLN: INTRAVENOUS | Status: DC | PRN

## 2020-09-04 MED ORDER — METHYLPREDNISOLONE SODIUM SUCC 125 MG IJ SOLR: 125.0000 mg | Freq: Once | INTRAMUSCULAR | Status: DC | PRN

## 2020-09-04 MED ORDER — ALBUTEROL SULFATE HFA 108 (90 BASE) MCG/ACT IN AERS: 2.0000 | INHALATION_SPRAY | Freq: Once | RESPIRATORY_TRACT | Status: DC | PRN

## 2020-09-04 MED ORDER — SOTROVIMAB 500 MG/8ML IV SOLN
500.0000 mg | Freq: Once | INTRAVENOUS | Status: AC
  Administered 2020-09-04: 500 mg via INTRAVENOUS

## 2020-09-04 MED ORDER — DIPHENHYDRAMINE HCL 50 MG/ML IJ SOLN: 50.0000 mg | Freq: Once | INTRAMUSCULAR | Status: DC | PRN

## 2020-09-04 NOTE — Progress Notes (Signed)
Patient reviewed Fact Sheet for Patients, Parents, and Caregivers for Emergency Use Authorization (EUA) of sotrovimab for the Treatment of Coronavirus. Patient also reviewed and is agreeable to the estimated cost of treatment. Patient is agreeable to proceed.   

## 2020-09-04 NOTE — Discharge Instructions (Signed)

## 2020-09-04 NOTE — Progress Notes (Signed)
  Diagnosis: COVID-19  Physician: Dr. Wright      Procedure: sotrovimab infusion   Complications: No immediate complications noted.  Discharge: Discharged home   Nayden Czajka 09/04/2020    

## 2020-09-06 ENCOUNTER — Telehealth: Payer: Self-pay | Admitting: Adult Health Nurse Practitioner

## 2020-09-06 NOTE — Telephone Encounter (Signed)
Spoke with patient's daughter, [name], regarding rescheduling the 09/07/20 Palliative f/u visit (due to patient tested positive for COVID on 09/01/20), this was rescheduled for 09/22/20 @ 2 PM

## 2020-09-07 ENCOUNTER — Other Ambulatory Visit: Payer: Medicare Other | Admitting: Adult Health Nurse Practitioner

## 2020-09-22 ENCOUNTER — Other Ambulatory Visit: Payer: Self-pay

## 2020-09-22 ENCOUNTER — Other Ambulatory Visit: Payer: Medicare Other | Admitting: Adult Health Nurse Practitioner

## 2020-09-22 DIAGNOSIS — G459 Transient cerebral ischemic attack, unspecified: Secondary | ICD-10-CM

## 2020-09-22 DIAGNOSIS — I482 Chronic atrial fibrillation, unspecified: Secondary | ICD-10-CM

## 2020-09-22 DIAGNOSIS — Z515 Encounter for palliative care: Secondary | ICD-10-CM

## 2020-09-22 DIAGNOSIS — U071 COVID-19: Secondary | ICD-10-CM

## 2020-09-22 NOTE — Progress Notes (Signed)
Therapist, nutritional Palliative Care Consult Note Telephone: [phone]  Fax: [phone]  PATIENT NAME: Suzanne Cantu DOB: [birth year] MRN: [id number]  PRIMARY CARE PROVIDER:   Corky Downs, MD  REFERRING PROVIDER:  Corky Downs, MD [address] [facility] [address] [place],  [place] [address]  RESPONSIBLE PARTY:   Eino Farber, daughter [phone]  Chief complaint:  Follow up palliative visit/post COVID   Talked with daughter on phone and caregiver, Misty Stanley, present during visit.  RECOMMENDATIONS and PLAN:  1.  Advanced care planning.  Patient is DNR  2.  TIAs/A. fib.  Every now and then patient seems to have a week where she is off and it is possible that she is having TIAs with history of previous CVA and TIAs.  Her Eliquis has been increased from 2-1/2 mg twice daily to 5 mg twice daily.  3.  Covid infection.  Patient is doing much better after treatment for Covid.  Does not appear to be having any lingering signs or symptoms.  Patient back at baseline  Health of we will continue to monitor for symptom management/decline and make recommendations as needed.  Follow-up in 8 weeks.  Encouraged to call with any questions or concerns.  HISTORY OF PRESENT ILLNESS:  Suzanne Cantu is a 87 y.o. year old female with multiple medical problems including weakness post CVA, HTN, DMT2, a-fib. Palliative Care was asked to help address goals of care. Reviewed patient's EMR including most recent labs and imaging.  Patient had COVID-19 infection earlier this month and was given infusion of monoclonal antibodies and oral antiviral.  Today she is not complaining of fever, cough, shortness of breath.  She continues to have episodes of diarrhea off and on. She did have stool sample tested for C. difficile and this was negative. PCP thought it could possibly be her Metformin causing the diarrhea.  Daughter did have questions about that causing diarrhea as her dosage has not changed in  quite some time.  Daughter does state that her blood sugars have been a little higher going up into the 150s and 170s.  Her providers are not concerned about this at this time given her age.  Daughter is concerned that about 2 weeks ago it seemed as if her mother had another TIA event.  States that for about a week she was more lethargic, sleeping more in the day, not eating as much and not getting up as often as she was.  Now she seems to be back at her baseline.  Patient's appetite is good and her weight has increased 3 pounds.  Currently weighs 111 pounds with BMI of 21.72 and earlier in February she weighed 108 pounds with BMI of 21.09.  Patient denies pain.  She has not had any falls or hospitalizations since last visit.  Rest of 10 point ROS asked and negative.  CODE STATUS: DNR  PPS: 40% HOSPICE ELIGIBILITY/DIAGNOSIS: TBD  PHYSICAL EXAM:  BP 124/76  HR 84  O2 98% on RA  General: NAD, frail appearing, thin Eyes: sclera anicteric and noninjected with no discharge noted ENMT: moist mucous membranes Cardiovascular: regular rate and rhythm Pulmonary:lung sounds clear; normal respiratory effort Abdomen: soft, nontender, + bowel sounds Extremities: no edema, no joint deformities Skin: no rashes on exposed skin Neurological: Weakness but otherwise nonfocal  PAST MEDICAL HISTORY:  Past Medical History:  Diagnosis Date  . Diabetes mellitus without complication (HCC)   . Hypertension   . Stroke [facility])     SOCIAL HX:  Social History   Tobacco Use  . Smoking status: Never Smoker  . Smokeless tobacco: Never Used  Substance Use Topics  . Alcohol use: Never    ALLERGIES:  Allergies  Allergen Reactions  . Valproic Acid And Related Other (See Comments)    Low blood pressure, weakness.  Remus Blake Other (See Comments)    "It made me not feel right"  . Peanut-Containing Drug Products Other (See Comments)    Raises blood sugar     PERTINENT MEDICATIONS:  Outpatient Encounter  Medications as of 09/22/2020  Medication Sig  . acetaminophen (TYLENOL) 325 MG tablet Take 1-2 tablets (325-650 mg total) by mouth every 6 (six) hours as needed for mild pain (pain score 1-3 or temp > 100.5).  Marland Kitchen ALPRAZolam (XANAX) 0.25 MG tablet Take 1 tablet (0.25 mg total) by mouth daily.  Marland Kitchen amLODipine (NORVASC) 5 MG tablet Take 1 tablet (5 mg total) by mouth daily.  Marland Kitchen apixaban (ELIQUIS) 2.5 MG TABS tablet Take 2 tablets (5 mg total) by mouth 2 (two) times daily.  . bimatoprost (LUMIGAN) 0.01 % SOLN Place 1 drop into both eyes at bedtime.  . Cholecalciferol (VITAMIN D-3 PO) Take 1,000 mg by mouth daily after supper.   . feeding supplement, GLUCERNA SHAKE, (GLUCERNA SHAKE) LIQD Take 237 mLs by mouth 3 (three) times daily between meals.  . insulin aspart (NOVOLOG) 100 UNIT/ML injection Before each meal 3 times a day, 140-199 - 2 units, 200-250 - 4 units, 251-299 - 6 units,  300-349 - 8 units,  350 or above 10 units. Insulin PEN if approved, provide syringes and needles if needed. (Patient taking differently: Before each meal 3 times a day, 140-199 - 2 units, 200-250 - 4 units, 251-299 - 6 units,  300-349 - 8 units,  350 or above 10 units. Insulin PEN if approved, provide syringes and needles if needed.)  . insulin glargine (LANTUS) 100 UNIT/ML injection Inject 0.1 mLs (10 Units total) into the skin daily.  . magic mouthwash SOLN Take 5 mLs by mouth 3 (three) times daily as needed for mouth pain.  . metFORMIN (GLUCOPHAGE) 500 MG tablet Take 1 tablet (500 mg total) by mouth 2 (two) times daily with a meal.  . Multiple Vitamins-Minerals (PRESERVISION AREDS 2) CAPS Take 1 capsule by mouth 2 (two) times daily.  Marland Kitchen senna-docusate (SENOKOT-S) 8.6-50 MG tablet Take 1 tablet by mouth at bedtime.  . valsartan (DIOVAN) 40 MG tablet TAKE 1 TABLET BY MOUTH EVERY DAY   No facility-administered encounter medications on file as of 09/22/2020.     Jerelene Salaam Marlena Clipper, NP

## 2020-09-29 ENCOUNTER — Other Ambulatory Visit: Payer: Medicare Other | Admitting: Adult Health Nurse Practitioner

## 2020-09-29 ENCOUNTER — Other Ambulatory Visit: Payer: Self-pay

## 2020-09-29 DIAGNOSIS — G459 Transient cerebral ischemic attack, unspecified: Secondary | ICD-10-CM

## 2020-09-29 DIAGNOSIS — I482 Chronic atrial fibrillation, unspecified: Secondary | ICD-10-CM

## 2020-09-29 DIAGNOSIS — Z515 Encounter for palliative care: Secondary | ICD-10-CM

## 2020-09-29 NOTE — Progress Notes (Addendum)
Therapist, nutritional Palliative Care Consult Note Telephone: [phone]  Fax: [phone]  PATIENT NAME: Suzanne Cantu DOB: 1926-09-08 MRN: [id number]  PRIMARY CARE PROVIDER:   Corky Downs, MD  REFERRING PROVIDER:  Corky Downs, MD [address] [place],  [place] [address]  RESPONSIBLE PARTY:  Eino Farber, daughter [phone]  Chief complaint: Follow up palliative visit/post COVID  Talked with daughter on phone and caregiver, Misty Stanley, present during visit.  RECOMMENDATIONS and PLAN: 1.Advanced care planning. Patient is DNR  2.  TIAs/a-fib.  Despite increase in Plavix patient is still having episodes which could be possible TIAs/CVA.  Discussed with daughter that increased blood clots are also complication related to Covid which she had earlier this month.  Patient appears to be having slow decline with each episode.  Also discussed with daughter that if she has more of these episodes or more severe episodes that we could see a more rapid decline.  Also discussed the possibility that after having Covid she may be having more blood clot formation.  We discussed hospice and daughter would be interested in hospice if her mother is eligible.  Reached out to hospice physicians for their input and at this time would like more close follow-up by palliative to see if she will have more decline to meet eligibility requirements for hospice unless family just wants her to be on comfort measures.  Discussed this with daughter and she wants to discuss this further with her siblings before making a decision.  She will call this provider back once they have discussed this further.   I spent 60 minutes providing this consultation, including time spent with patient/family, provider coordination, chart review, documentation. More than 50% of the time in this consultation was spent coordinating communication.   HISTORY OF PRESENT ILLNESS:  Suzanne Cantu is a 87  y.o. year old female with multiple medical problems including weakness post CVA, HTN, DMT2, a-fib. Palliative Care was asked to help address goals of care. Patient has been having episodes in which family believes she is having TIAs or strokes.  The episodes usually last a few days in which she is less verbal and less active.  I saw her last week after an episode but she was nearly back to baseline.  Saw her again today due to having another episode yesterday.   Prior to these episodes she was ambulatory with walker and only required stand by assist. Only needed minimal assistance with ADLs and was alert and oriented x3.    Her eliquis was doubled about a month ago and she was doing better at first. She was having these episodes every few months to weeks apart. The episodes occur almost every other week now.  With last episode she is having more of a cognitive decline than functional.  She is forgetting what to do with mechanisms of toileting and forgets what to do with eating utensils.  She has to be reminded what to do with most everything now (this is new for the patient).  Having more difficulty swallowing meds. She has started hallucinating and talking with someone in a chair that others do not see.  She is still able to stand and ambulate with a walker but is slower and needs more prompting what to do. Caregiver did mention that her gums were bleeding with brushing her teeth. Did reach out to PCP office with this concern.  Weight has been stable.  Caregivers do state that after this last episode that  she is eating less.  Though even prior to these episodes she had to be encouraged to eat.  Reported today that he is eating 50% or less of what she used to about a week ago.  No weight loss as of yet.  No wounds.  CODE STATUS: DNR  PPS: 40% HOSPICE ELIGIBILITY/DIAGNOSIS: TBD  PHYSICAL EXAM: BP 124/66 HR 77 O2 98% on RA  General: NAD, frail appearing, thin Eyes: sclera anicteric and noninjected  with no discharge noted ENMT: moist mucous membranes Cardiovascular: regular rate and rhythm Pulmonary:lung sounds clear; normal respiratory effort Abdomen: soft, nontender, + bowel sounds Extremities: no edema, no joint deformities Skin: no rashes on exposed skin Neurological: Weakness; A&O to person and place  PAST MEDICAL HISTORY:  Past Medical History:  Diagnosis Date  . Diabetes mellitus without complication (HCC)   . Hypertension   . Stroke [facility])     SOCIAL HX:  Social History   Tobacco Use  . Smoking status: Never Smoker  . Smokeless tobacco: Never Used  Substance Use Topics  . Alcohol use: Never    ALLERGIES:  Allergies  Allergen Reactions  . Valproic Acid And Related Other (See Comments)    Low blood pressure, weakness.  Remus Blake Other (See Comments)    "It made me not feel right"  . Peanut-Containing Drug Products Other (See Comments)    Raises blood sugar     PERTINENT MEDICATIONS:  Outpatient Encounter Medications as of 09/29/2020  Medication Sig  . acetaminophen (TYLENOL) 325 MG tablet Take 1-2 tablets (325-650 mg total) by mouth every 6 (six) hours as needed for mild pain (pain score 1-3 or temp > 100.5).  Marland Kitchen ALPRAZolam (XANAX) 0.25 MG tablet Take 1 tablet (0.25 mg total) by mouth daily.  Marland Kitchen amLODipine (NORVASC) 5 MG tablet Take 1 tablet (5 mg total) by mouth daily.  Marland Kitchen apixaban (ELIQUIS) 2.5 MG TABS tablet Take 2 tablets (5 mg total) by mouth 2 (two) times daily.  . bimatoprost (LUMIGAN) 0.01 % SOLN Place 1 drop into both eyes at bedtime.  . Cholecalciferol (VITAMIN D-3 PO) Take 1,000 mg by mouth daily after supper.   . feeding supplement, GLUCERNA SHAKE, (GLUCERNA SHAKE) LIQD Take 237 mLs by mouth 3 (three) times daily between meals.  . insulin aspart (NOVOLOG) 100 UNIT/ML injection Before each meal 3 times a day, 140-199 - 2 units, 200-250 - 4 units, 251-299 - 6 units,  300-349 - 8 units,  350 or above 10 units. Insulin PEN if approved, provide  syringes and needles if needed. (Patient taking differently: Before each meal 3 times a day, 140-199 - 2 units, 200-250 - 4 units, 251-299 - 6 units,  300-349 - 8 units,  350 or above 10 units. Insulin PEN if approved, provide syringes and needles if needed.)  . insulin glargine (LANTUS) 100 UNIT/ML injection Inject 0.1 mLs (10 Units total) into the skin daily.  . magic mouthwash SOLN Take 5 mLs by mouth 3 (three) times daily as needed for mouth pain.  . metFORMIN (GLUCOPHAGE) 500 MG tablet Take 1 tablet (500 mg total) by mouth 2 (two) times daily with a meal.  . Multiple Vitamins-Minerals (PRESERVISION AREDS 2) CAPS Take 1 capsule by mouth 2 (two) times daily.  Marland Kitchen senna-docusate (SENOKOT-S) 8.6-50 MG tablet Take 1 tablet by mouth at bedtime.  . valsartan (DIOVAN) 40 MG tablet TAKE 1 TABLET BY MOUTH EVERY DAY   No facility-administered encounter medications on file as of 09/29/2020.     Aizza Santiago  Marlena Clipper, NP

## 2020-10-04 ENCOUNTER — Telehealth: Payer: Self-pay | Admitting: Adult Health Nurse Practitioner

## 2020-10-04 NOTE — Telephone Encounter (Signed)
Spoke with daughter to see how her mother was doing.  She states her mother is doing better but not back to baseline.  She does understand that she may not get fully back to baseline.  Have an appointment in 6 weeks and encouraged to call sooner if needed.   Mohsin Crum K. Garner Nash NP

## 2020-10-19 ENCOUNTER — Other Ambulatory Visit: Payer: Self-pay | Admitting: Internal Medicine

## 2020-10-26 ENCOUNTER — Ambulatory Visit (INDEPENDENT_AMBULATORY_CARE_PROVIDER_SITE_OTHER): Payer: Medicare Other | Admitting: Internal Medicine

## 2020-10-26 ENCOUNTER — Other Ambulatory Visit: Payer: Self-pay

## 2020-10-26 VITALS — BP 129/80 | HR 93 | Ht 60.5 in | Wt 109.7 lb

## 2020-10-26 DIAGNOSIS — I6359 Cerebral infarction due to unspecified occlusion or stenosis of other cerebral artery: Secondary | ICD-10-CM

## 2020-10-26 DIAGNOSIS — E119 Type 2 diabetes mellitus without complications: Secondary | ICD-10-CM

## 2020-10-26 DIAGNOSIS — I10 Essential (primary) hypertension: Secondary | ICD-10-CM

## 2020-10-26 DIAGNOSIS — I482 Chronic atrial fibrillation, unspecified: Secondary | ICD-10-CM | POA: Diagnosis not present

## 2020-10-26 NOTE — Assessment & Plan Note (Signed)
There is no recurrence of stroke.

## 2020-10-26 NOTE — Assessment & Plan Note (Signed)
Patient blood pressure is normal patient denies any chest pain or shortness of breath there is no history of palpitation or paroxysmal nocturnal dyspnea   patient was advised to follow low-salt low-cholesterol diet   

## 2020-10-26 NOTE — Assessment & Plan Note (Signed)
Atrial fibrillation is paroxysmal but stable.

## 2020-10-26 NOTE — Progress Notes (Signed)
Established Patient Office Visit  Subjective:  Patient ID: Suzanne Cantu, female    DOB: Aug 11, 1926  Age: 87 y.o. MRN: [id number]  CC: No chief complaint on file.   HPI  Suzanne Cantu presents for general check, patient denies any chest pain shortness of breath she walks with the help of a walker.  He does not smoke.  She mentally is coherent.  Past Medical History:  Diagnosis Date  . Diabetes mellitus without complication (HCC)   . Hypertension   . Stroke [facility])     Past Surgical History:  Procedure Laterality Date  . ABDOMINAL HYSTERECTOMY    . CESAREAN SECTION     4  . CHOLECYSTECTOMY    . INTRAMEDULLARY (IM) NAIL INTERTROCHANTERIC Right 04/24/2020   Procedure: INTRAMEDULLARY (IM) NAIL INTERTROCHANTRIC;  Surgeon: Juanell Fairly, MD;  Location: [facility] ORS;  Service: Orthopedics;  Laterality: Right;  . KIDNEY STONE SURGERY      Family History  Problem Relation Age of Onset  . Diabetes Mellitus II Sister     Social History   Socioeconomic History  . Marital status: Widowed    Spouse name: Not on file  . Number of children: Not on file  . Years of education: Not on file  . Highest education level: Not on file  Occupational History  . Not on file  Tobacco Use  . Smoking status: Never Smoker  . Smokeless tobacco: Never Used  Substance and Sexual Activity  . Alcohol use: Never  . Drug use: Never  . Sexual activity: Not on file  Other Topics Concern  . Not on file  Social History Narrative  . Not on file   Social Determinants of Health   Financial Resource Strain: Not on file  Food Insecurity: Not on file  Transportation Needs: Not on file  Physical Activity: Not on file  Stress: Not on file  Social Connections: Not on file  Intimate Partner Violence: Not on file     Current Outpatient Medications:  .  acetaminophen (TYLENOL) 325 MG tablet, Take 1-2 tablets (325-650 mg total) by mouth every 6 (six) hours as needed for mild pain (pain score  1-3 or temp > 100.5)., Disp: , Rfl:  .  ALPRAZolam (XANAX) 0.25 MG tablet, Take 1 tablet (0.25 mg total) by mouth daily., Disp: 60 tablet, Rfl: 0 .  amLODipine (NORVASC) 5 MG tablet, Take 1 tablet (5 mg total) by mouth daily., Disp: , Rfl:  .  apixaban (ELIQUIS) 2.5 MG TABS tablet, Take 2 tablets (5 mg total) by mouth 2 (two) times daily., Disp: 60 tablet, Rfl: 6 .  bimatoprost (LUMIGAN) 0.01 % SOLN, Place 1 drop into both eyes at bedtime., Disp: , Rfl:  .  Cholecalciferol (VITAMIN D-3 PO), Take 1,000 mg by mouth daily after supper. , Disp: , Rfl:  .  feeding supplement, GLUCERNA SHAKE, (GLUCERNA SHAKE) LIQD, Take 237 mLs by mouth 3 (three) times daily between meals., Disp: , Rfl: 0 .  magic mouthwash SOLN, Take 5 mLs by mouth 3 (three) times daily as needed for mouth pain., Disp: , Rfl: 0 .  metFORMIN (GLUCOPHAGE) 500 MG tablet, TAKE 1 TABLET EVERY 12     HOURS, Disp: 180 tablet, Rfl: 3 .  Multiple Vitamins-Minerals (PRESERVISION AREDS 2) CAPS, Take 1 capsule by mouth 2 (two) times daily., Disp: , Rfl:  .  senna-docusate (SENOKOT-S) 8.6-50 MG tablet, Take 1 tablet by mouth at bedtime., Disp: , Rfl:  .  valsartan (DIOVAN) 40 MG tablet,  TAKE 1 TABLET BY MOUTH EVERY DAY, Disp: 90 tablet, Rfl: 1   Allergies  Allergen Reactions  . Valproic Acid And Related Other (See Comments)    Low blood pressure, weakness.  Remus Blake Other (See Comments)    "It made me not feel right"  . Peanut-Containing Drug Products Other (See Comments)    Raises blood sugar    ROS Review of Systems  Constitutional: Negative.   HENT: Negative.   Eyes: Negative.   Respiratory: Negative.   Cardiovascular: Negative.   Gastrointestinal: Negative.   Skin: Negative.   Psychiatric/Behavioral: Negative.   All other systems reviewed and are negative.     Objective:    Physical Exam Vitals reviewed.  Constitutional:      Appearance: Normal appearance.  HENT:     Mouth/Throat:     Mouth: Mucous membranes are  moist.  Eyes:     Pupils: Pupils are equal, round, and reactive to light.  Neck:     Vascular: No carotid bruit.  Cardiovascular:     Rate and Rhythm: Normal rate and regular rhythm.     Pulses: Normal pulses.     Heart sounds: Normal heart sounds.  Pulmonary:     Effort: Pulmonary effort is normal.     Breath sounds: Normal breath sounds.  Abdominal:     General: Bowel sounds are normal.     Palpations: Abdomen is soft. There is no hepatomegaly, splenomegaly or mass.     Tenderness: There is no abdominal tenderness.     Hernia: No hernia is present.  Musculoskeletal:        General: No tenderness. Normal range of motion.     Cervical back: Neck supple.     Right lower leg: No edema.     Left lower leg: No edema.  Skin:    Findings: No rash.  Neurological:     General: No focal deficit present.     Mental Status: She is alert and oriented to person, place, and time.     Motor: No weakness.  Psychiatric:        Mood and Affect: Affect normal.     There were no vitals taken for this visit. Wt Readings from Last 3 Encounters:  08/30/20 111 lb 3.2 oz (50.4 kg)  08/16/20 108 lb (49 kg)  08/16/20 108 lb (49 kg)     Health Maintenance Due  Topic Date Due  . COVID-19 Vaccine (1) Never done  . FOOT EXAM  Never done  . OPHTHALMOLOGY EXAM  Never done  . DEXA SCAN  Never done  . PNA vac Low Risk Adult (1 of 2 - PCV13) Never done  . HEMOGLOBIN A1C  10/20/2020    There are no preventive care reminders to display for this patient.  Lab Results  Component Value Date   TSH 2.468 08/01/2019   Lab Results  Component Value Date   WBC 5.9 08/17/2020   HGB 12.9 08/17/2020   HCT 38.1 08/17/2020   MCV 87.6 08/17/2020   PLT 269 08/17/2020   Lab Results  Component Value Date   NA 136 08/17/2020   K 4.8 08/17/2020   CO2 24 08/17/2020   GLUCOSE 228 (H) 08/17/2020   BUN 14 08/17/2020   CREATININE 0.72 08/17/2020   BILITOT 0.4 08/17/2020   ALKPHOS 57 11/25/2019   AST 14  08/17/2020   ALT 9 08/17/2020   PROT 6.5 08/17/2020   ALBUMIN 3.4 (L) 11/25/2019   CALCIUM 10.1 08/17/2020  ANIONGAP 8 04/28/2020   Lab Results  Component Value Date   CHOL 185 10/28/2019   Lab Results  Component Value Date   HDL 51 10/28/2019   Lab Results  Component Value Date   LDLCALC 116 (H) 10/28/2019   Lab Results  Component Value Date   TRIG 90 10/28/2019   Lab Results  Component Value Date   CHOLHDL 3.6 10/28/2019   Lab Results  Component Value Date   HGBA1C 7.3 (H) 04/21/2020      Assessment & Plan:   Problem List Items Addressed This Visit      Cardiovascular and Mediastinum   Essential hypertension    Patient blood pressure is normal patient denies any chest pain or shortness of breath there is no history of palpitation or paroxysmal nocturnal dyspnea   patient was advised to follow low-salt low-cholesterol diet           CVA (cerebral vascular accident) (HCC)    There is no recurrence of stroke.      Atrial fibrillation, chronic (HCC) - Primary    Atrial fibrillation is paroxysmal but stable.        Endocrine   Controlled type 2 diabetes mellitus without complication, without long-term current use of insulin (HCC)    - The patient's blood sugar is under control on med. - The patient will continue the current treatment regimen.  - I encouraged the patient to regularly check blood sugar.  - I encouraged the patient to monitor diet. I encouraged the patient to eat low-carb and low-sugar to help prevent blood sugar spikes.  - I encouraged the patient to continue following their prescribed treatment plan for diabetes - I informed the patient to get help if blood sugar drops below 54mg /dL, or if suddenly have trouble thinking clearly or breathing.  Stop insulin completely.         No orders of the defined types were placed in this encounter.   Follow-up: No follow-ups on file.  Patient is DNR discussed with her daughter, she can go to  the beach.  , MD

## 2020-10-26 NOTE — Assessment & Plan Note (Signed)
-   The patient's blood sugar is under control on med. - The patient will continue the current treatment regimen.  - I encouraged the patient to regularly check blood sugar.  - I encouraged the patient to monitor diet. I encouraged the patient to eat low-carb and low-sugar to help prevent blood sugar spikes.  - I encouraged the patient to continue following their prescribed treatment plan for diabetes - I informed the patient to get help if blood sugar drops below 54mg /dL, or if suddenly have trouble thinking clearly or breathing.  Stop insulin completely.

## 2020-10-27 ENCOUNTER — Encounter: Payer: Self-pay | Admitting: Internal Medicine

## 2020-11-16 ENCOUNTER — Encounter: Payer: Self-pay | Admitting: Adult Health Nurse Practitioner

## 2020-11-16 ENCOUNTER — Other Ambulatory Visit: Payer: Medicare Other | Admitting: Adult Health Nurse Practitioner

## 2020-11-16 ENCOUNTER — Other Ambulatory Visit: Payer: Self-pay

## 2020-11-16 VITALS — BP 133/74 | HR 98

## 2020-11-16 DIAGNOSIS — Z515 Encounter for palliative care: Secondary | ICD-10-CM

## 2020-11-16 DIAGNOSIS — G459 Transient cerebral ischemic attack, unspecified: Secondary | ICD-10-CM

## 2020-11-16 NOTE — Progress Notes (Signed)
[facility] Consult Note Telephone: [phone]  Fax: [phone]    Date of encounter: 11/16/20 PATIENT NAME: Suzanne Cantu [id number]   [phone] (home)  DOB: 31-May-1927 MRN: [id number] PRIMARY CARE PROVIDER:    Cletis Athens, MD,  [address] [place] [address] [phone]  REFERRING PROVIDER:   Cletis Athens, [address] [place],  [place] [address] [phone]  RESPONSIBLE PARTY:    Contact Information    Name Relation Home Work Mobile   [place] Daughter   [phone]   [name] Daughter [phone]     Ceria, [facility]) Son   [phone]       I met face to face with patient and family in home. Palliative Care was asked to follow this patient by consultation request of  Cletis Athens, MD to address advance care planning and complex medical decision making. This is a follow up visit.  Daughter, Marlowe Kays, and caregiver, Hassan Rowan, present during visit today                                   ASSESSMENT AND PLAN / RECOMMENDATIONS:   Advance Care Planning/Goals of Care: Goals include to maximize quality of life and symptom management.   CODE STATUS: DNR  Symptom Management/Plan:  TIAs: Patient does appear to have had a CVA over the weekend and has right sided weakness and may have some expressive aphasia.  Family uncertain if she is going to bounce back from this event and would like to wait a few days to see what happens.  Will call daughter in about a week to see how she is doing and further discuss options such as hospice.   Follow up Palliative Care Visit: Palliative care will continue to follow for complex medical decision making, advance care planning, and clarification of goals.  Will call early next week to see how patient is doing and schedule follow-up if needed.  Encouraged to call with any questions or concerns  I spent 60 minutes  providing this consultation. More than 50% of the time in this consultation was spent in counseling and care coordination.  PPS: 40%  HOSPICE ELIGIBILITY/DIAGNOSIS: TBD  Chief Complaint: Follow-up palliative visit  HISTORY OF PRESENT ILLNESS:  Suzanne Cantu is a 87 y.o. year old female  with weakness post CVA, HTN, DMT2, a-fib. Daughter states that she missed her Eliquis 4 days ago, last Friday.  States that she will usually have an event (TIA/CVA) whenever she misses a dose.  Family believes she had an event Saturday night as on Sunday she was not herself.  She had wet her bed which is not normal for her, she was moving slower, and they had difficulty giving her her meds as she would pocket them.  She is eating but she is using her left hand instead of her dominant right hand.  She will not drink unless it is offered up to her mouth.  The family and caregivers have been pushing fluids but she is drinking only about half of what she used to.  She is A&O to only person today.  She does follow commands. She has noted right sided weakness when walking.  She is usually engaging in conversation but not today. She is noted having a blank stare off and on during visit today. Caregiver does state that she  has gotten upset when trying to say something but unable to verbalize it. Caregiver and daughter both state that she has been seeing passed loved ones more often over the past few weeks. Daughter does state that usually after an event she will bounce back in about 3 days, and they are uncertain if she is going to bounce back from this one.    History obtained from review of EMR and interview with family, caregiver and Ms. Wingler.   PHYSICAL EXAM:  General: NAD, frail appearing, thin Eyes: sclera anicteric and noninjected with no discharge noted ENMT: moist mucous membranes Cardiovascular: regular rate and rhythm Pulmonary:lung sounds clear; normal respiratory effort Abdomen: soft, nontender, +  bowel sounds Extremities: no edema, no joint deformities Skin: no rasheson exposed skin Neurological: Weakness; A&O to person   Thank you for the opportunity to participate in the care of [name].  The palliative care team will continue to follow. Please call our office at [phone] if we can be of additional assistance.   Emaley Applin Jenetta Downer, NP , DNP  This chart was dictated using voice recognition software. Despite best efforts to proofread, errors can occur which can change the documentation meaning.   COVID-19 PATIENT SCREENING TOOL Asked and negative response unless otherwise noted:   Have you had symptoms of covid, tested positive or been in contact with someone with symptoms/positive test in the past 5-10 days?  Negative

## 2020-11-18 ENCOUNTER — Other Ambulatory Visit: Payer: Medicare Other | Admitting: Adult Health Nurse Practitioner

## 2020-11-18 ENCOUNTER — Telehealth: Payer: Self-pay | Admitting: Adult Health Nurse Practitioner

## 2020-11-18 NOTE — Telephone Encounter (Signed)
Spoke with daughter who had questions about how much fluids and what kind of fluids she should be having.  Answered her questions. Encouraged to call with any further questions and concerns Blakely Gluth K. Garner Nash NP

## 2020-11-19 ENCOUNTER — Other Ambulatory Visit: Payer: Self-pay | Admitting: *Deleted

## 2020-11-19 MED ORDER — AZITHROMYCIN 250 MG PO TABS: ORAL_TABLET | ORAL | 0 refills | Status: AC

## 2020-11-22 ENCOUNTER — Other Ambulatory Visit: Payer: Medicare Other | Admitting: Adult Health Nurse Practitioner

## 2020-11-22 ENCOUNTER — Telehealth: Payer: Self-pay | Admitting: Adult Health Nurse Practitioner

## 2020-11-22 ENCOUNTER — Other Ambulatory Visit: Payer: Self-pay

## 2020-11-22 NOTE — Telephone Encounter (Signed)
Returned daughter's VM.  Her mom is having gout flare up which has improved over the weekend.  She is currently on Zpack for sinus infection.  Have appointment 11/25/20 @ 11am.  Encouraged to call with any questions or concerns Samaia Iwata K. Garner Nash NP

## 2020-11-24 ENCOUNTER — Telehealth: Payer: Self-pay | Admitting: Adult Health Nurse Practitioner

## 2020-11-24 NOTE — Telephone Encounter (Signed)
Spoke with daughter and reminded of visit tomorrow Suzanne Cantu K. Garner Nash, NP

## 2020-11-25 ENCOUNTER — Other Ambulatory Visit: Payer: Self-pay

## 2020-11-25 ENCOUNTER — Other Ambulatory Visit: Payer: Medicare Other | Admitting: Adult Health Nurse Practitioner

## 2020-11-25 ENCOUNTER — Encounter: Payer: Self-pay | Admitting: Adult Health Nurse Practitioner

## 2020-11-25 VITALS — BP 134/80

## 2020-11-25 DIAGNOSIS — I482 Chronic atrial fibrillation, unspecified: Secondary | ICD-10-CM

## 2020-11-25 DIAGNOSIS — G459 Transient cerebral ischemic attack, unspecified: Secondary | ICD-10-CM

## 2020-11-25 DIAGNOSIS — Z515 Encounter for palliative care: Secondary | ICD-10-CM

## 2020-11-25 NOTE — Progress Notes (Signed)
[facility] Consult Note Telephone: [phone]  Fax: [phone]    Date of encounter: 11/25/20 PATIENT NAME: Suzanne Cantu [id number]   [phone] (home)  DOB: [birth year] MRN: [id number] PRIMARY CARE PROVIDER:    Cletis Athens, MD,  [place] [address] [phone]  REFERRING PROVIDER:   Cletis Cantu, [place],  [place] [address] [phone]  RESPONSIBLE PARTY:    Contact Information    Name Relation Home Work Mobile   Portal Daughter   [phone]   Suzanne Cantu Daughter [phone]     Suzanne, Cantu [facility]) Son   [phone]       I met face to face with patient and family in home. Palliative Care was asked to follow this patient by consultation request of  Suzanne Athens, MD to address advance care planning and complex medical decision making. This is a follow up visit.  Daughter, Suzanne Cantu, and caregiver, Suzanne Cantu, present during visit today                                   ASSESSMENT AND PLAN / RECOMMENDATIONS:   Advance Care Planning/Goals of Care: Goals include to maximize quality of life and symptom management.   CODE STATUS: DNR  Symptom Management/Plan:  TIA/CVA/a-fib: Patient having functional and nutritional decline after past CVA.  Discussed hospice and family does have concerns about her Eliquis being stopped if she went under hospice services.  Patient does have TIA/CVA with any missed dose of her Eliquis.  I have reached out to hospice physicians with this case and they are willing to admit her under hospice services with current medications as medical exceptions considering her unique case.  Family in agreement to proceed with hospice.  I have reached out to Dr. Paticia Stack office for hospice referral.  Was informed that they will send in the referral tomorrow and that he will continue to be attending.   I spent 60  minutes providing this consultation. More than 50% of the time in this consultation was spent in counseling and care coordination.  PPS: 30%  HOSPICE ELIGIBILITY/DIAGNOSIS: Yes/CVA  Chief Complaint: follow up palliative visit  HISTORY OF PRESENT ILLNESS:  Suzanne Cantu is a 87 y.o. year old female  with weakness post CVA, HTN, DMT2, a-fib.  Patient has not bounced back from this last CVA event as she usually does.  Patient is sleeping during visit today and unable to contribute to HPI/ROS.  Patient has had another CVA about 10 days ago.  She is now PPS 30% requiring assistance with stand and pivot and all ADLs.  She is now incontinent.  She is not as verbal.  Sleeps all day unless woken up for care.  She is eating but it is taking caregivers an hour or more to feed her one meal and she is eating about half of what she used to.  Daughter does state that she did have a gout flareup in her right hand which has greatly improved since they have been giving her cherry juice.  Daughter and caregiver states that she is not showing signs of pain at this time.  I do state that at times her speech is garbled but they are able at times to make out when she says love you or yes/no.  History obtained from review of EMR  and interview with family and [name].    PHYSICAL EXAM:  General: NAD, frail appearing, thin Eyes: sclera anicteric and noninjected; tearing and matting noted around both eyes ENMT: moist mucous membranes Pulmonary: normal respiratory effort Extremities: no edema, no joint deformities Skin: no rasheson exposed skin Neurological: Weakness; A&O to person; patient sleeping most of the visit  Thank you for the opportunity to participate in the care of [name].  The palliative care team will continue to follow. Please call our office at [phone] if we can be of additional assistance.   Shiree Altemus Jenetta Downer, NP , DNP  This chart was dictated using voice recognition software. Despite  best efforts to proofread, errors can occur which can change the documentation meaning.   COVID-19 PATIENT SCREENING TOOL Asked and negative response unless otherwise noted:   Have you had symptoms of covid, tested positive or been in contact with someone with symptoms/positive test in the past 5-10 days? negative

## 2020-11-26 ENCOUNTER — Telehealth: Payer: Self-pay | Admitting: Adult Health Nurse Practitioner

## 2020-11-26 NOTE — Telephone Encounter (Signed)
Called [place] NP with [facility] to inform her that family has agreed to hospice for Suzanne Cantu. Lidya Mccalister K. Garner Nash NP

## 2020-12-28 ENCOUNTER — Other Ambulatory Visit: Payer: Self-pay | Admitting: *Deleted

## 2020-12-28 DIAGNOSIS — I480 Paroxysmal atrial fibrillation: Secondary | ICD-10-CM

## 2020-12-28 MED ORDER — APIXABAN 2.5 MG PO TABS: 5.0000 mg | ORAL_TABLET | Freq: Two times a day (BID) | ORAL | 6 refills | Status: DC

## 2021-01-10 ENCOUNTER — Emergency Department

## 2021-01-10 ENCOUNTER — Other Ambulatory Visit: Payer: Self-pay

## 2021-01-10 ENCOUNTER — Inpatient Hospital Stay
Admission: EM | Admit: 2021-01-10 | Discharge: 2021-01-14 | DRG: 388 | Disposition: A | Discharge status: Hospice | Attending: Internal Medicine | Admitting: Internal Medicine

## 2021-01-10 DIAGNOSIS — R52 Pain, unspecified: Secondary | ICD-10-CM

## 2021-01-10 DIAGNOSIS — F0150 Vascular dementia without behavioral disturbance: Secondary | ICD-10-CM | POA: Diagnosis present

## 2021-01-10 DIAGNOSIS — I480 Paroxysmal atrial fibrillation: Secondary | ICD-10-CM | POA: Diagnosis present

## 2021-01-10 DIAGNOSIS — Z7189 Other specified counseling: Secondary | ICD-10-CM | POA: Diagnosis not present

## 2021-01-10 DIAGNOSIS — Z20822 Contact with and (suspected) exposure to covid-19: Secondary | ICD-10-CM | POA: Diagnosis present

## 2021-01-10 DIAGNOSIS — Z9049 Acquired absence of other specified parts of digestive tract: Secondary | ICD-10-CM

## 2021-01-10 DIAGNOSIS — Z515 Encounter for palliative care: Secondary | ICD-10-CM

## 2021-01-10 DIAGNOSIS — Z66 Do not resuscitate: Secondary | ICD-10-CM | POA: Diagnosis present

## 2021-01-10 DIAGNOSIS — E43 Unspecified severe protein-calorie malnutrition: Secondary | ICD-10-CM | POA: Diagnosis present

## 2021-01-10 DIAGNOSIS — D638 Anemia in other chronic diseases classified elsewhere: Secondary | ICD-10-CM | POA: Diagnosis present

## 2021-01-10 DIAGNOSIS — Z8673 Personal history of transient ischemic attack (TIA), and cerebral infarction without residual deficits: Secondary | ICD-10-CM

## 2021-01-10 DIAGNOSIS — N390 Urinary tract infection, site not specified: Secondary | ICD-10-CM | POA: Diagnosis present

## 2021-01-10 DIAGNOSIS — F419 Anxiety disorder, unspecified: Secondary | ICD-10-CM | POA: Diagnosis present

## 2021-01-10 DIAGNOSIS — R569 Unspecified convulsions: Secondary | ICD-10-CM

## 2021-01-10 DIAGNOSIS — Z7401 Bed confinement status: Secondary | ICD-10-CM

## 2021-01-10 DIAGNOSIS — Z794 Long term (current) use of insulin: Secondary | ICD-10-CM | POA: Diagnosis not present

## 2021-01-10 DIAGNOSIS — K5981 Ogilvie syndrome: Secondary | ICD-10-CM | POA: Diagnosis present

## 2021-01-10 DIAGNOSIS — I10 Essential (primary) hypertension: Secondary | ICD-10-CM | POA: Diagnosis present

## 2021-01-10 DIAGNOSIS — Z90710 Acquired absence of both cervix and uterus: Secondary | ICD-10-CM | POA: Diagnosis not present

## 2021-01-10 DIAGNOSIS — E119 Type 2 diabetes mellitus without complications: Secondary | ICD-10-CM | POA: Diagnosis present

## 2021-01-10 DIAGNOSIS — Z7901 Long term (current) use of anticoagulants: Secondary | ICD-10-CM | POA: Diagnosis not present

## 2021-01-10 DIAGNOSIS — Z681 Body mass index (BMI) 19 or less, adult: Secondary | ICD-10-CM | POA: Diagnosis not present

## 2021-01-10 DIAGNOSIS — Z5320 Procedure and treatment not carried out because of patient's decision for unspecified reasons: Secondary | ICD-10-CM | POA: Diagnosis not present

## 2021-01-10 DIAGNOSIS — I482 Chronic atrial fibrillation, unspecified: Secondary | ICD-10-CM | POA: Diagnosis present

## 2021-01-10 DIAGNOSIS — Z7984 Long term (current) use of oral hypoglycemic drugs: Secondary | ICD-10-CM

## 2021-01-10 DIAGNOSIS — Z833 Family history of diabetes mellitus: Secondary | ICD-10-CM

## 2021-01-10 DIAGNOSIS — G9340 Encephalopathy, unspecified: Secondary | ICD-10-CM | POA: Diagnosis present

## 2021-01-10 DIAGNOSIS — E876 Hypokalemia: Secondary | ICD-10-CM | POA: Diagnosis present

## 2021-01-10 DIAGNOSIS — R54 Age-related physical debility: Secondary | ICD-10-CM | POA: Diagnosis present

## 2021-01-10 DIAGNOSIS — Z888 Allergy status to other drugs, medicaments and biological substances status: Secondary | ICD-10-CM

## 2021-01-10 DIAGNOSIS — K5900 Constipation, unspecified: Secondary | ICD-10-CM

## 2021-01-10 DIAGNOSIS — Z91010 Allergy to peanuts: Secondary | ICD-10-CM

## 2021-01-10 DIAGNOSIS — K567 Ileus, unspecified: Principal | ICD-10-CM | POA: Diagnosis present

## 2021-01-10 DIAGNOSIS — Z79899 Other long term (current) drug therapy: Secondary | ICD-10-CM

## 2021-01-10 LAB — RESP PANEL BY RT-PCR (FLU A&B, COVID) ARPGX2
Influenza A by PCR: NEGATIVE
Influenza B by PCR: NEGATIVE
SARS Coronavirus 2 by RT PCR: NEGATIVE

## 2021-01-10 LAB — COMPREHENSIVE METABOLIC PANEL
ALT: 12 U/L (ref 0–44)
AST: 24 U/L (ref 15–41)
Albumin: 3.6 g/dL (ref 3.5–5.0)
Alkaline Phosphatase: 85 U/L (ref 38–126)
Anion gap: 7 (ref 5–15)
BUN: 10 mg/dL (ref 8–23)
CO2: 29 mmol/L (ref 22–32)
Calcium: 9.5 mg/dL (ref 8.9–10.3)
Chloride: 98 mmol/L (ref 98–111)
Creatinine, Ser: 0.65 mg/dL (ref 0.44–1.00)
GFR, Estimated: >60 mL/min (ref >60)
Glucose, Bld: 272 mg/dL — ABNORMAL HIGH (ref 70–99)
Potassium: 3.2 mmol/L — ABNORMAL LOW (ref 3.5–5.1)
Sodium: 134 mmol/L — ABNORMAL LOW (ref 135–145)
Total Bilirubin: 1 mg/dL (ref 0.3–1.2)
Total Protein: 7.2 g/dL (ref 6.5–8.1)

## 2021-01-10 LAB — MAGNESIUM: Magnesium: 2 mg/dL (ref 1.7–2.4)

## 2021-01-10 LAB — URINALYSIS, COMPLETE (UACMP) WITH MICROSCOPIC
Bacteria, UA: NONE SEEN
Bilirubin Urine: NEGATIVE
Glucose, UA: >=500 mg/dL — AB
Ketones, ur: NEGATIVE mg/dL
Nitrite: NEGATIVE
Protein, ur: 30 mg/dL — AB
Specific Gravity, Urine: 1.015 (ref 1.005–1.030)
WBC, UA: >50 WBC/hpf — ABNORMAL HIGH (ref 0–5)
pH: 6 (ref 5.0–8.0)

## 2021-01-10 LAB — CBC
HCT: 36.8 % (ref 36.0–46.0)
Hemoglobin: 12.4 g/dL (ref 12.0–15.0)
MCH: 30.6 pg (ref 26.0–34.0)
MCHC: 33.7 g/dL (ref 30.0–36.0)
MCV: 90.9 fL (ref 80.0–100.0)
Platelets: 339 10*3/uL (ref 150–400)
RBC: 4.05 MIL/uL (ref 3.87–5.11)
RDW: 13 % (ref 11.5–15.5)
WBC: 13.7 10*3/uL — ABNORMAL HIGH (ref 4.0–10.5)
nRBC: 0 % (ref 0.0–0.2)

## 2021-01-10 LAB — LIPASE, BLOOD: Lipase: 26 U/L (ref 11–51)

## 2021-01-10 IMAGING — CT CT ABD-PELV W/ CM
2 of 5 series · 15 of 46 positions shown, 17 images · IV contrast (APPLIED)
Comparison: Plain film from earlier in the day

CLINICAL DATA: Abdominal distension and history of constipation,
initial encounter

EXAM:
CT ABDOMEN AND PELVIS WITH CONTRAST
TECHNIQUE: Multidetector CT imaging of the abdomen and pelvis was performed
using the standard protocol following bolus administration of
intravenous contrast.
CONTRAST:  75mL OMNIPAQUE IOHEXOL 350 MG/ML SOLN

[Series 2: axial st · axial · 0.73mm/px · z∈[-430,-10]mm · 12 of 94 slices shown, 14 images]
[im 5/94  soft-tissue]
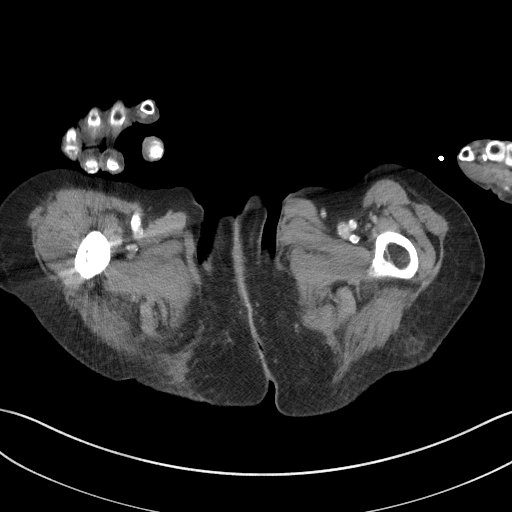
[im 5/94  bone]
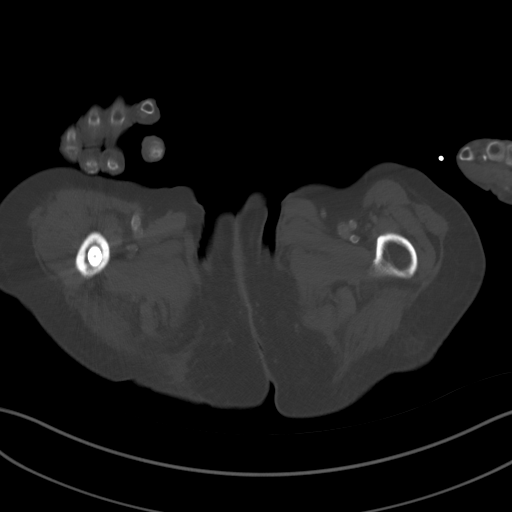
[im 15/94  soft-tissue]
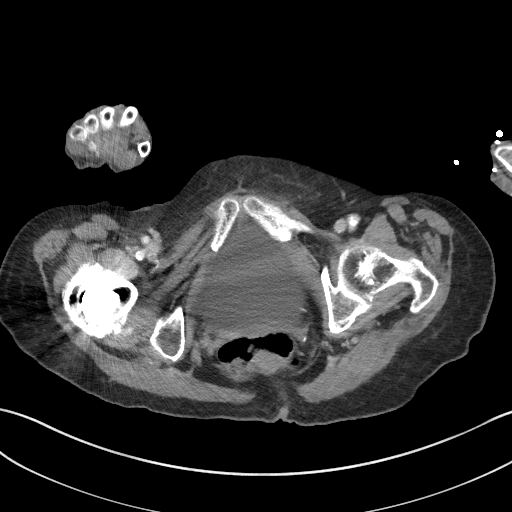
[im 20/94  soft-tissue]
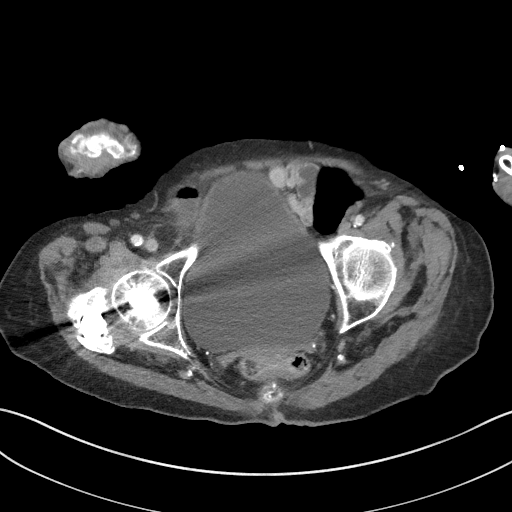
[im 30/94  soft-tissue]
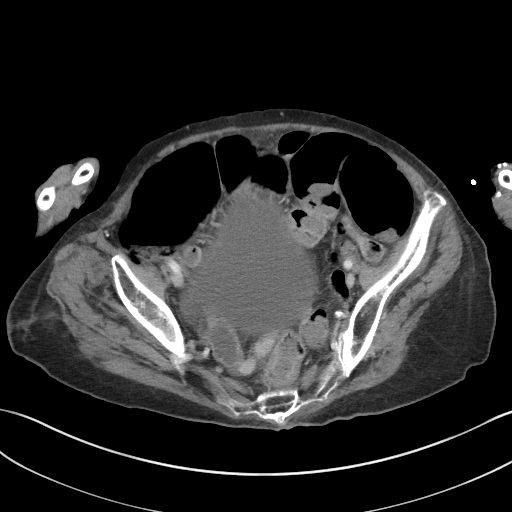
[im 35/94  soft-tissue]
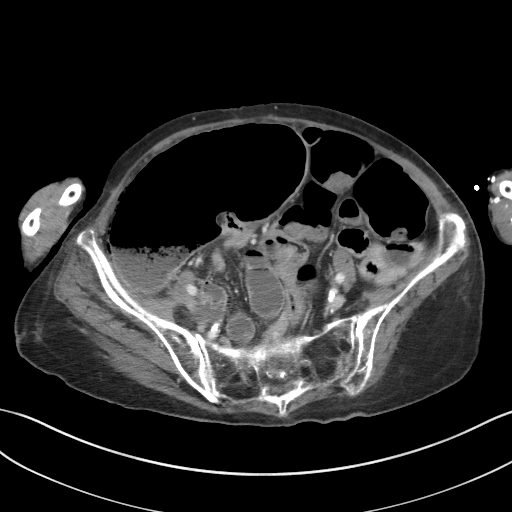
[im 45/94  soft-tissue]
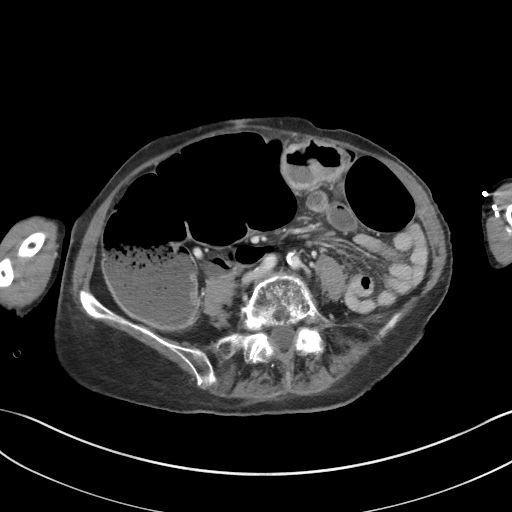
[im 49/94  soft-tissue]
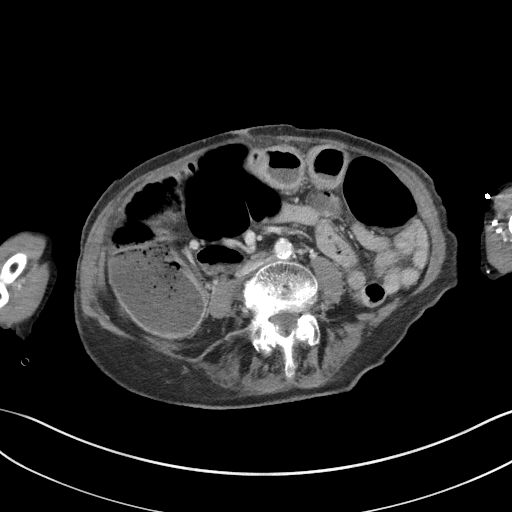
[im 59/94  soft-tissue]
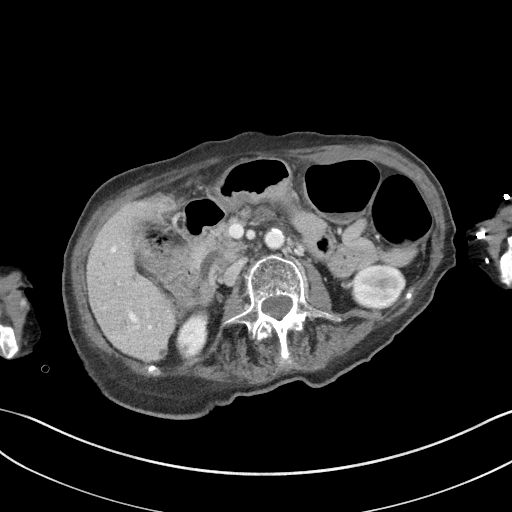
[im 64/94  soft-tissue]
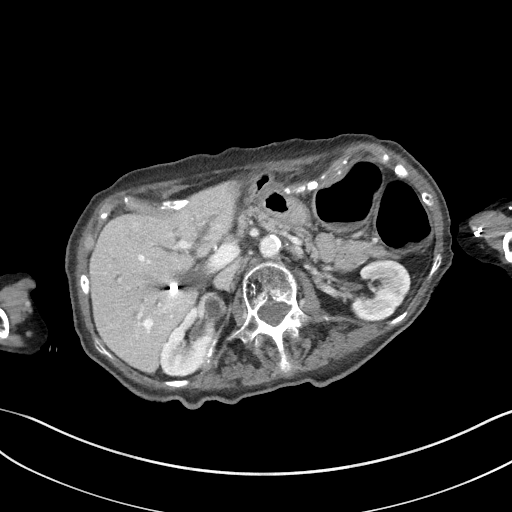
[im 64/94  bone]
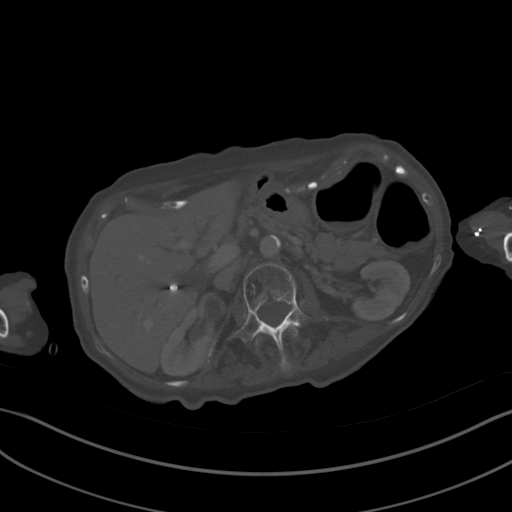
[im 74/94  soft-tissue]
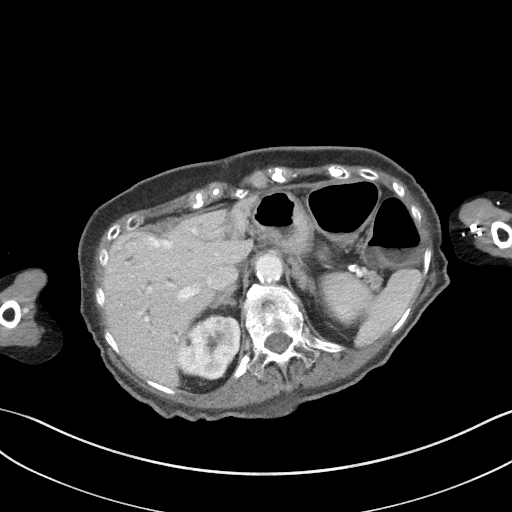
[im 79/94  soft-tissue]
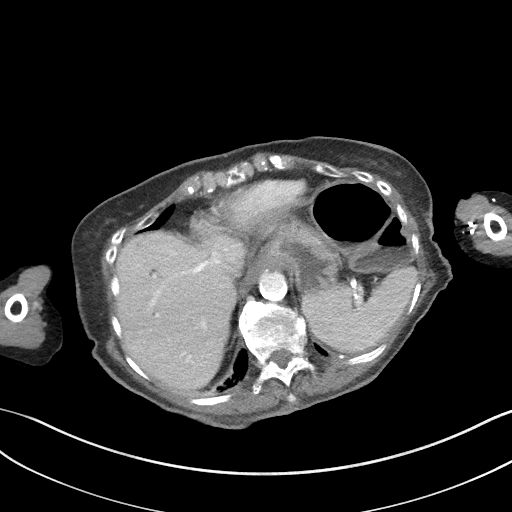
[im 89/94  soft-tissue]
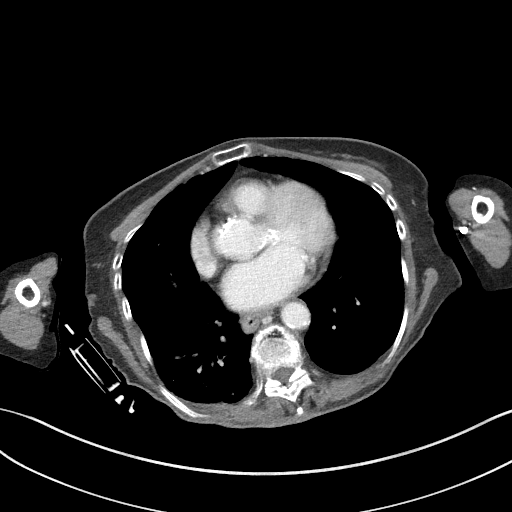

[Series 5: coronal st · coronal · 0.75mm/px · 3 of 82 slices shown]
[im 28/82  soft-tissue]
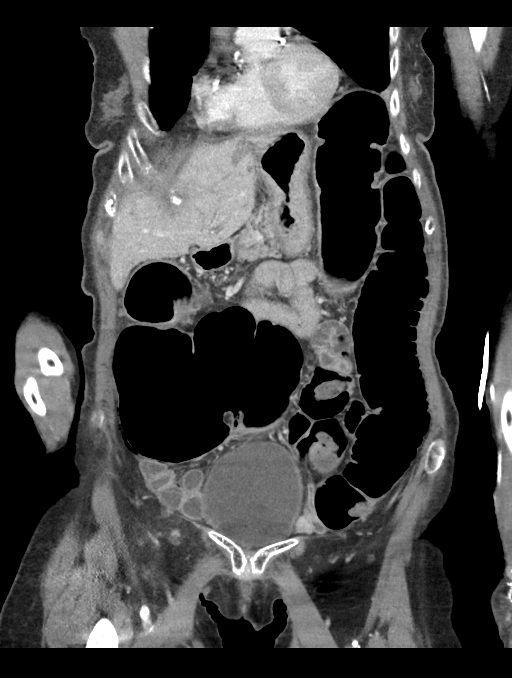
[im 37/82  soft-tissue]
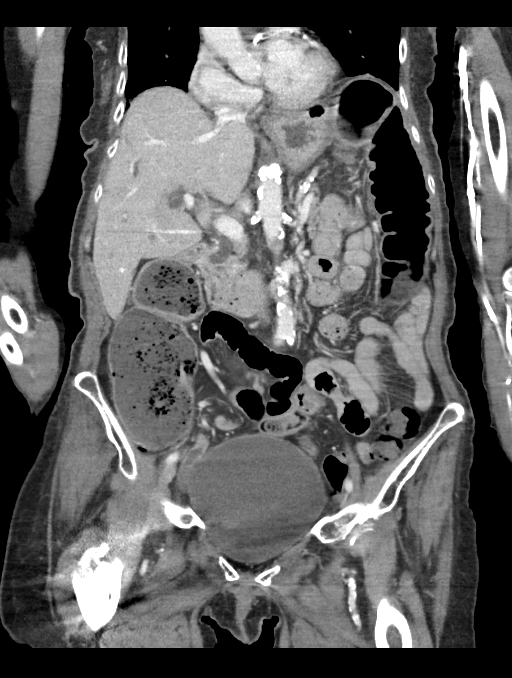
[im 46/82  soft-tissue]
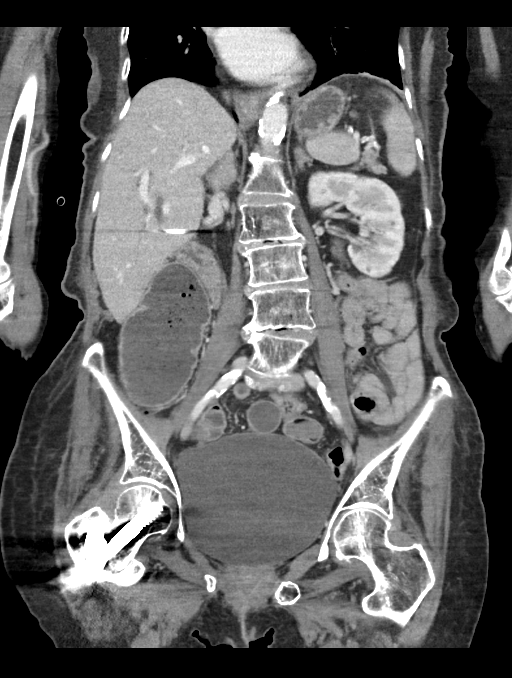

[15 of 46 positions shown; findings below may reference images not displayed]

FINDINGS: Lower chest: Mild dependent atelectatic changes are noted in the
right base. Coronary calcifications are seen.

Hepatobiliary: Gallbladder has been surgically removed. Biliary
ductal dilatation is noted consistent with the post cholecystectomy
state. A small hypodensity is noted in the inferior aspect of the
right lobe of the liver likely representing a small cyst.

Pancreas: Unremarkable. No pancreatic ductal dilatation or
surrounding inflammatory changes.

Spleen: Normal in size without focal abnormality.

Adrenals/Urinary Tract: Adrenal glands are within normal limits.
Kidneys demonstrate a normal enhancement pattern bilaterally.
Scattered small cysts are seen bilaterally. Prominent extrarenal
pelvis on the right is noted. No true obstructive changes are seen.
The bladder is significantly distended.

Stomach/Bowel: Gaseous distension of the colon is noted similar to
that seen on prior plain film examination. Mild areas of wall
thickening are noted in the mid transverse colon although likely
related to decompression as opposed to focal abnormality. The
appendix is not visualized. No inflammatory changes are noted to
suggest appendicitis. The small bowel and stomach are within normal
limits.

Vascular/Lymphatic: Aortic atherosclerosis. No enlarged abdominal or
pelvic lymph nodes.

Reproductive: Status post hysterectomy. No adnexal masses.

Other: No abdominal wall hernia or abnormality. No abdominopelvic
ascites.

Musculoskeletal: Postsurgical changes in the proximal right femur
are seen. Degenerative changes of the lumbar spine are noted.
IMPRESSION: Gaseous distension of the colon likely representing a diffuse ileus.
Correlate with the clinical exam. No significant constipation or
obstructing lesion is noted.

Status post cholecystectomy with biliary ductal dilatation. This is
felt to be within normal limits for the patient's age and post
cholecystectomy state.

Mild right basilar atelectasis.

## 2021-01-10 IMAGING — CR DG ABDOMEN 2V
1 series · 2 of 2 positions shown · non-contrast
Comparison: None.

CLINICAL DATA: Constipation.  Vomiting since yesterday.

EXAM:
ABDOMEN - 2 VIEW

[Series 1: dg abd 2 views · 0.14mm/px · 2 of 2 slices shown]
[im 1/2]
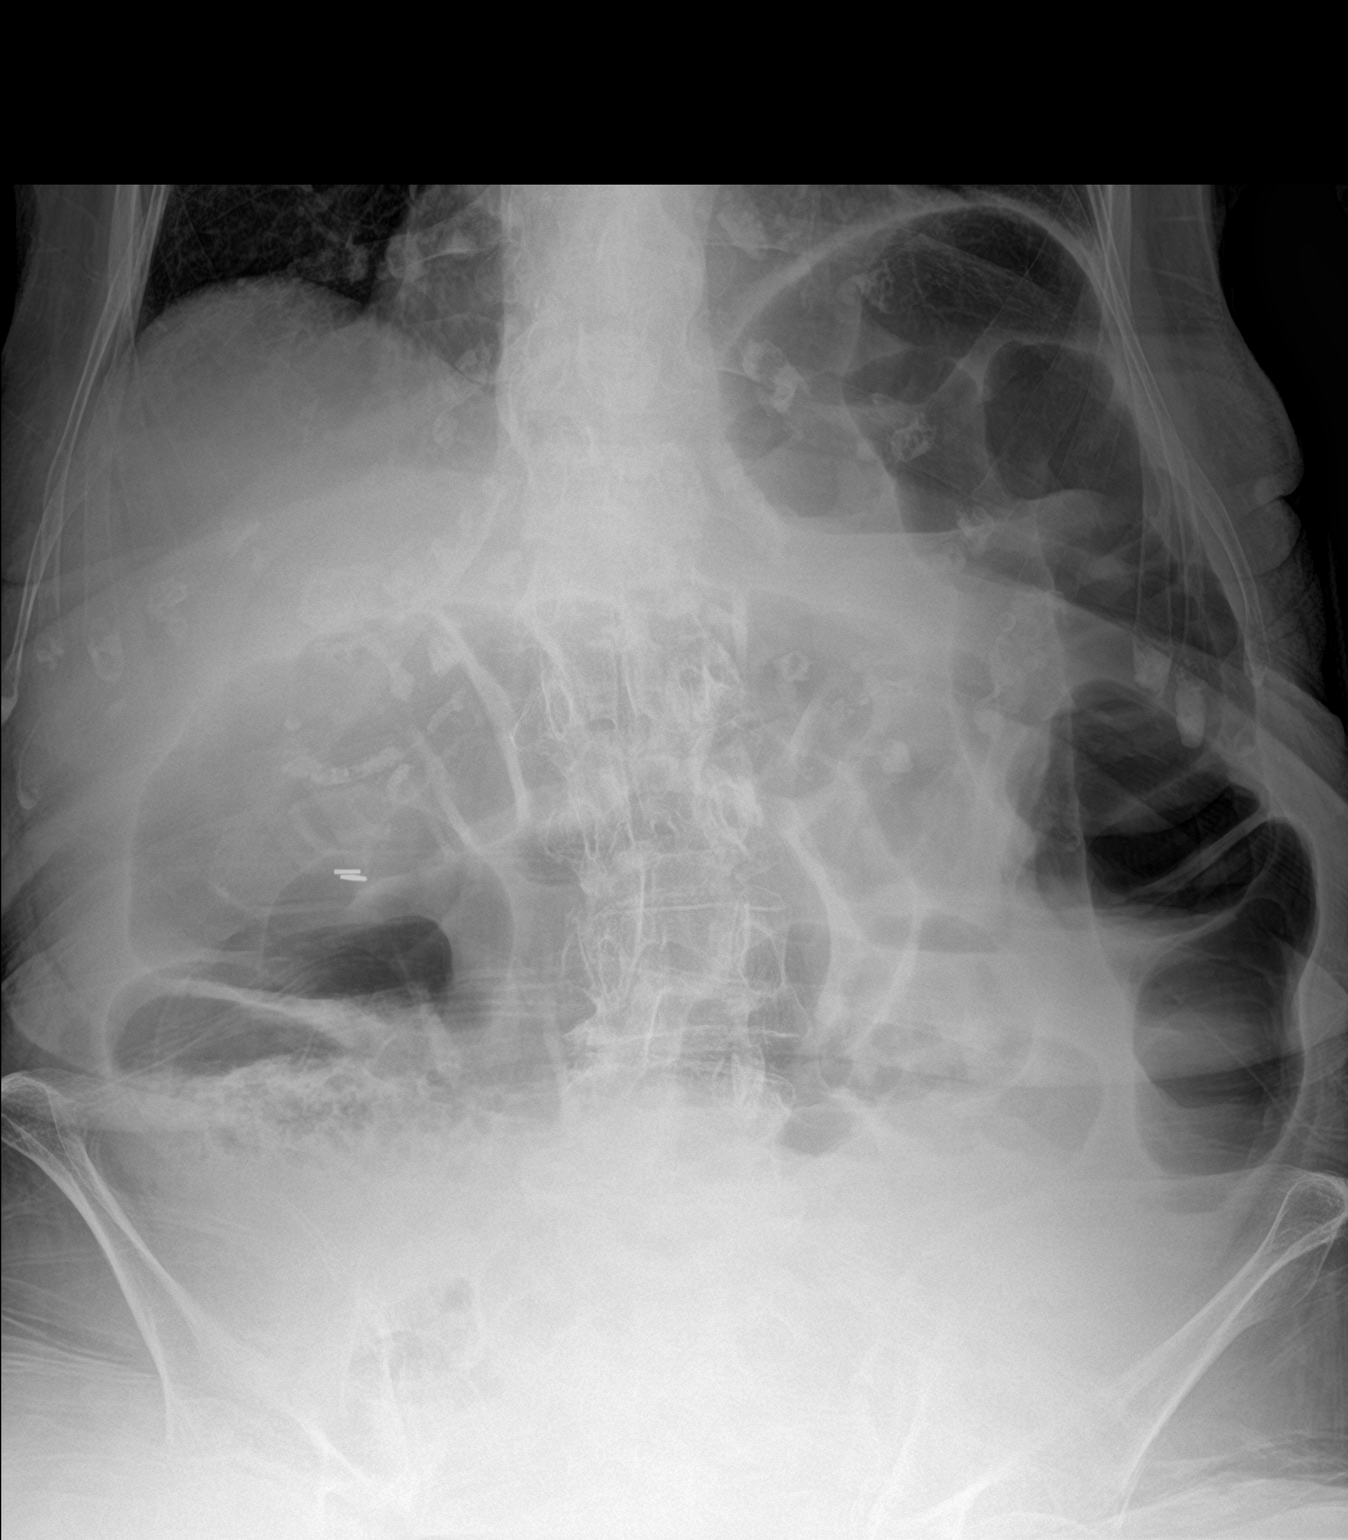
[im 2/2]
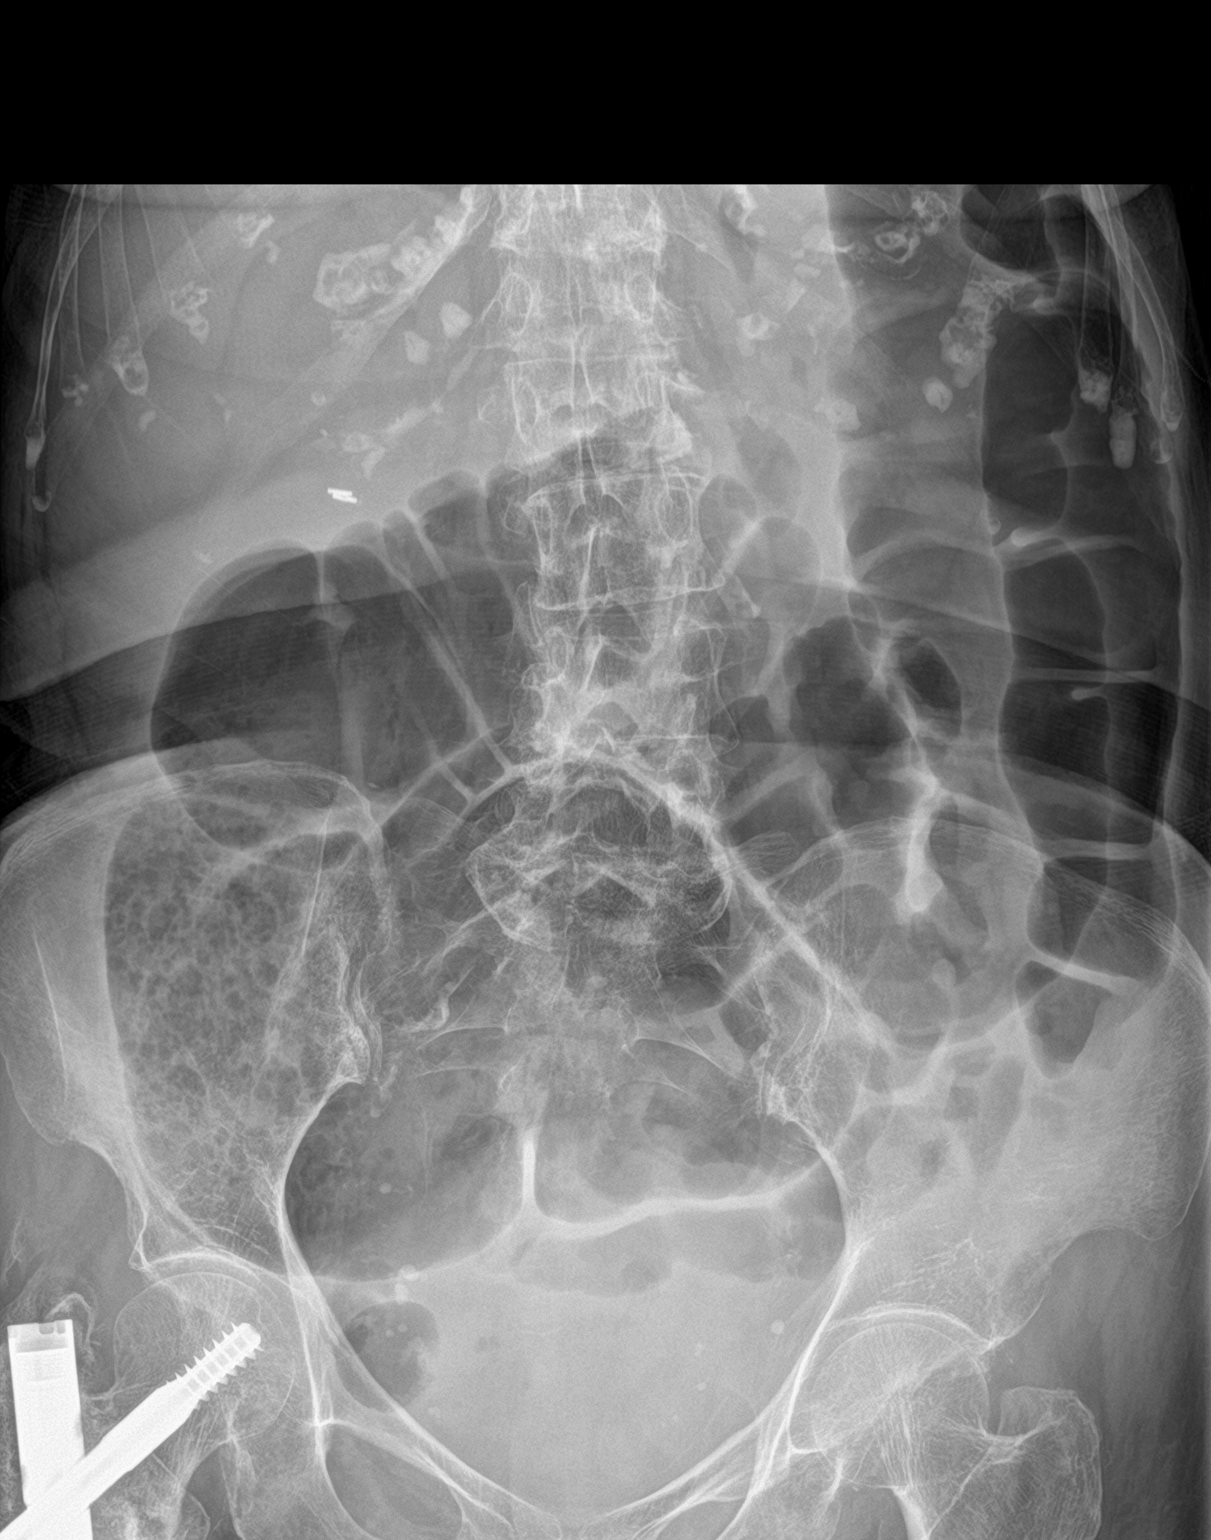

[2 of 2 positions shown; findings below may reference images not displayed]

FINDINGS: There is diffuse gaseous distension of the colon to the level of the
left lower quadrant of the abdomen. Moderate volume of retained
stool is noted within the right lower quadrant of the abdomen. No
small bowel dilatation identified. There is no evidence of free air.
No radio-opaque calculi or other significant radiographic
abnormality is seen.
IMPRESSION: Diffuse gaseous distension of the colon to the level of the left
lower quadrant of the abdomen. Findings may reflect colonic ileus or
distal colonic obstruction. If there is a clinical concern for
distal colonic obstruction consider further investigation with CT of
the abdomen pelvis with IV and oral contrast material

Large amount of retained stool noted in the right lower quadrant of
the abdomen. It is unclear whether not this represents stool in the
cecum or redundant sigmoid colon.

## 2021-01-10 MED ORDER — ACETAMINOPHEN 325 MG PO TABS: 325.0000 mg | ORAL_TABLET | Freq: Four times a day (QID) | ORAL | Status: DC | PRN

## 2021-01-10 MED ORDER — MAGIC MOUTHWASH
5.0000 mL | Freq: Three times a day (TID) | ORAL | Status: DC | PRN
  Filled 2021-01-10: qty 10

## 2021-01-10 MED ORDER — ONDANSETRON HCL 4 MG/2ML IJ SOLN
4.0000 mg | Freq: Four times a day (QID) | INTRAMUSCULAR | Status: DC | PRN
  Administered 2021-01-11 – 2021-01-12 (×2): 4 mg via INTRAVENOUS
  Filled 2021-01-10 (×2): qty 2

## 2021-01-10 MED ORDER — LATANOPROST 0.005 % OP SOLN
1.0000 [drp] | Freq: Every day | OPHTHALMIC | Status: DC
  Administered 2021-01-12 – 2021-01-13 (×2): 1 [drp] via OPHTHALMIC
  Filled 2021-01-10 (×2): qty 2.5

## 2021-01-10 MED ORDER — MORPHINE SULFATE (PF) 2 MG/ML IV SOLN
2.0000 mg | Freq: Once | INTRAVENOUS | Status: AC
  Administered 2021-01-10: 2 mg via INTRAMUSCULAR
  Filled 2021-01-10: qty 1

## 2021-01-10 MED ORDER — LORAZEPAM 0.5 MG PO TABS
0.5000 mg | ORAL_TABLET | Freq: Once | ORAL | Status: AC
  Administered 2021-01-10: 0.5 mg via ORAL
  Filled 2021-01-10: qty 1

## 2021-01-10 MED ORDER — HYDRALAZINE HCL 20 MG/ML IJ SOLN
5.0000 mg | Freq: Four times a day (QID) | INTRAMUSCULAR | Status: DC | PRN
  Administered 2021-01-11 – 2021-01-14 (×2): 5 mg via INTRAVENOUS
  Filled 2021-01-10 (×2): qty 1

## 2021-01-10 MED ORDER — LACTATED RINGERS IV BOLUS
1000.0000 mL | Freq: Once | INTRAVENOUS | Status: AC
  Administered 2021-01-10: 1000 mL via INTRAVENOUS

## 2021-01-10 MED ORDER — POLYETHYLENE GLYCOL 3350 17 G PO PACK
17.0000 g | PACK | Freq: Every day | ORAL | Status: DC
  Administered 2021-01-10 – 2021-01-14 (×3): 17 g via ORAL
  Filled 2021-01-10 (×3): qty 1

## 2021-01-10 MED ORDER — SODIUM CHLORIDE 0.9 % IV SOLN: INTRAVENOUS | Status: DC

## 2021-01-10 MED ORDER — LORAZEPAM 2 MG/ML IJ SOLN
0.5000 mg | Freq: Four times a day (QID) | INTRAMUSCULAR | Status: DC | PRN
  Administered 2021-01-11 – 2021-01-13 (×2): 0.5 mg via INTRAVENOUS
  Filled 2021-01-10 (×2): qty 1

## 2021-01-10 MED ORDER — IOHEXOL 350 MG/ML SOLN
75.0000 mL | Freq: Once | INTRAVENOUS | Status: AC | PRN
  Administered 2021-01-10: 75 mL via INTRAVENOUS

## 2021-01-10 MED ORDER — POTASSIUM CHLORIDE 10 MEQ/100ML IV SOLN
10.0000 meq | INTRAVENOUS | Status: AC
  Administered 2021-01-10 – 2021-01-11 (×4): 10 meq via INTRAVENOUS
  Filled 2021-01-10 (×4): qty 100

## 2021-01-10 MED ORDER — HEPARIN (PORCINE) 25000 UT/250ML-% IV SOLN
800.0000 [IU]/h | INTRAVENOUS | Status: DC
  Administered 2021-01-10 – 2021-01-12 (×2): 800 [IU]/h via INTRAVENOUS
  Filled 2021-01-10 (×3): qty 250

## 2021-01-10 MED ORDER — INSULIN ASPART 100 UNIT/ML IJ SOLN
0.0000 [IU] | Freq: Three times a day (TID) | INTRAMUSCULAR | Status: DC
  Administered 2021-01-11: 2 [IU] via SUBCUTANEOUS
  Administered 2021-01-11: 1 [IU] via SUBCUTANEOUS
  Administered 2021-01-12 – 2021-01-14 (×5): 2 [IU] via SUBCUTANEOUS
  Filled 2021-01-10 (×7): qty 1

## 2021-01-10 NOTE — ED Triage Notes (Signed)
Pt daughter, states the pt has been having constipation and they have been giving medication for it without relief, states she started having vomiting yesterday, pt is under hospice care for dementia/stroke

## 2021-01-10 NOTE — H&P (Signed)
History and Physical    Suzanne Cantu KQA:[id number] DOB: 1927-03-15 DOA: 01/10/2021  PCP: Suzanne Brome., Suzanne Downs, MD (Confirm with patient/family/NH records and if not entered, this has to be entered at [facility] point of entry) Patient coming from: Nursing home  I have personally briefly reviewed patient's old medical records in [facility]  Chief Complaint: Constipated, altered mental status  HPI: Suzanne Cantu is a 87 y.o. female with medical history significant of vascular dementia, repeated CVA, chronic A. fib, IDDM, HTN, presented with worsening of constipation and after mentations.  Patient was agitated in the ED and then sedated, unable to provide any history, most history provided by patient daughter at bedside.  Patient was started on hospice at nursing home last year for worsening of vascular dementia after repeated CVAs despite on Eliquis.  Patient has chronic constipation however worsened last week.  Nursing home staff tried different laxative stool softener and enema.  Despite, patient constipation not improving.  And patient started to have nausea and vomiting since Saturday.  Very little p.o. intake since.  Today, patient was sitting for abdominal pain and became more confused than baseline.  ED Course: CT abdomen showed significantly distended colon with gas but mild stool burden.  And also substantially distended bladder.  Review of Systems: Unable to perform, patient confused.  Past Medical History:  Diagnosis Date   Diabetes mellitus without complication (HCC)    Hypertension    Stroke [facility])     Past Surgical History:  Procedure Laterality Date   ABDOMINAL HYSTERECTOMY     CESAREAN SECTION     4   CHOLECYSTECTOMY     INTRAMEDULLARY (IM) NAIL INTERTROCHANTERIC Right 04/24/2020   Procedure: INTRAMEDULLARY (IM) NAIL INTERTROCHANTRIC;  Surgeon: Juanell Fairly, MD;  Location: [facility] ORS;  Service: Orthopedics;  Laterality: Right;   KIDNEY STONE SURGERY        reports that she has never smoked. She has never used smokeless tobacco. She reports that she does not drink alcohol and does not use drugs.  Allergies  Allergen Reactions   Valproic Acid And Related Other (See Comments)    Low blood pressure, weakness.   Metaxalone Other (See Comments)    "It made me not feel right"   Peanut-Containing Drug Products Other (See Comments)    Raises blood sugar    Family History  Problem Relation Age of Onset   Diabetes Mellitus II Sister      Prior to Admission medications   Medication Sig Start Date End Date Taking? Authorizing Provider  acetaminophen (TYLENOL) 325 MG tablet Take 1-2 tablets (325-650 mg total) by mouth every 6 (six) hours as needed for mild pain (pain score 1-3 or temp > 100.5). 05/01/20   Albertine Grates, MD  ALPRAZolam Prudy Feeler) 0.25 MG tablet Take 1 tablet (0.25 mg total) by mouth daily. 07/13/20   Suzanne Downs, MD  amLODipine (NORVASC) 5 MG tablet Take 1 tablet (5 mg total) by mouth daily. 05/02/20   Albertine Grates, MD  apixaban (ELIQUIS) 2.5 MG TABS tablet Take 2 tablets (5 mg total) by mouth 2 (two) times daily. 12/28/20   Suzanne Downs, MD  bimatoprost (LUMIGAN) 0.01 % SOLN Place 1 drop into both eyes at bedtime.    [provider]  Cholecalciferol (VITAMIN D-3 PO) Take 1,000 mg by mouth daily after supper.     [provider]  feeding supplement, GLUCERNA SHAKE, (GLUCERNA SHAKE) LIQD Take 237 mLs by mouth 3 (three) times daily between meals. 05/01/20  Albertine GratesXu, Fang, MD  magic mouthwash SOLN Take 5 mLs by mouth 3 (three) times daily as needed for mouth pain. 05/01/20   Albertine GratesXu, Fang, MD  metFORMIN (GLUCOPHAGE) 500 MG tablet TAKE 1 TABLET EVERY 12     HOURS 10/19/20   Suzanne DownsMasoud, Javed, MD  Multiple Vitamins-Minerals (PRESERVISION AREDS 2) CAPS Take 1 capsule by mouth 2 (two) times daily.    [provider]  senna-docusate (SENOKOT-S) 8.6-50 MG tablet Take 1 tablet by mouth at bedtime. 05/01/20   Albertine GratesXu, Fang, MD  valsartan  (DIOVAN) 40 MG tablet TAKE 1 TABLET BY MOUTH EVERY DAY 06/15/20   Suzanne DownsMasoud, Javed, MD    Physical Exam: Vitals:   01/10/21 1326 01/10/21 1657 01/10/21 1830 01/10/21 2000  BP: (!) 153/71 (!) 182/72 (!) 166/80 (!) 136/57  Pulse: 91 96 97 84  Resp: 15 18  16   Temp: 99 F (37.2 C)     TempSrc: Oral     SpO2: 99% 99% 100% 97%    Constitutional: NAD, calm, comfortable Vitals:   01/10/21 1326 01/10/21 1657 01/10/21 1830 01/10/21 2000  BP: (!) 153/71 (!) 182/72 (!) 166/80 (!) 136/57  Pulse: 91 96 97 84  Resp: 15 18  16   Temp: 99 F (37.2 C)     TempSrc: Oral     SpO2: 99% 99% 100% 97%   Eyes: PERRL, lids and conjunctivae normal ENMT: Mucous membranes are dry. Posterior pharynx clear of any exudate or lesions.Normal dentition.  Neck: normal, supple, no masses, no thyromegaly Respiratory: clear to auscultation bilaterally, no wheezing, no crackles. Normal respiratory effort. No accessory muscle use.  Cardiovascular: Regular rate and rhythm, no murmurs / rubs / gallops. No extremity edema. 2+ pedal pulses. No carotid bruits.  Abdomen: Distended, mild tenderness on periumbilical area, no rebound no guarding, no masses palpated. No hepatosplenomegaly. Bowel sounds positive.  Musculoskeletal: no clubbing / cyanosis. No joint deformity upper and lower extremities. Good ROM, no contractures. Normal muscle tone.  Skin: no rashes, lesions, ulcers. No induration Neurologic: No facial droops, moving all limbs, following simple commands Psychiatric: Arousable, confused    Labs on Admission: I have personally reviewed following labs and imaging studies  CBC: Recent Labs  Lab 01/10/21 1326  WBC 13.7*  HGB 12.4  HCT 36.8  MCV 90.9  PLT 339   Basic Metabolic Panel: Recent Labs  Lab 01/10/21 1326  NA 134*  K 3.2*  CL 98  CO2 29  GLUCOSE 272*  BUN 10  CREATININE 0.65  CALCIUM 9.5   GFR: CrCl cannot be calculated (Unknown ideal weight.). Liver Function Tests: Recent Labs  Lab  01/10/21 1326  AST 24  ALT 12  ALKPHOS 85  BILITOT 1.0  PROT 7.2  ALBUMIN 3.6   Recent Labs  Lab 01/10/21 1326  LIPASE 26   No results for input(s): AMMONIA in the last 168 hours. Coagulation Profile: No results for input(s): INR, PROTIME in the last 168 hours. Cardiac Enzymes: No results for input(s): CKTOTAL, CKMB, CKMBINDEX, TROPONINI in the last 168 hours. BNP (last 3 results) No results for input(s): PROBNP in the last 8760 hours. HbA1C: No results for input(s): HGBA1C in the last 72 hours. CBG: No results for input(s): GLUCAP in the last 168 hours. Lipid Profile: No results for input(s): CHOL, HDL, LDLCALC, TRIG, CHOLHDL, LDLDIRECT in the last 72 hours. Thyroid Function Tests: No results for input(s): TSH, T4TOTAL, FREET4, T3FREE, THYROIDAB in the last 72 hours. Anemia Panel: No results for input(s): VITAMINB12, FOLATE, FERRITIN, TIBC,  IRON, RETICCTPCT in the last 72 hours. Urine analysis:    Component Value Date/Time   COLORURINE YELLOW (A) 04/22/2020 1318   APPEARANCEUR Clear 08/16/2020 1324   LABSPEC 1.012 04/22/2020 1318   PHURINE 6.0 04/22/2020 1318   GLUCOSEU Negative 08/16/2020 1324   HGBUR NEGATIVE 04/22/2020 1318   BILIRUBINUR Negative 08/16/2020 1324   KETONESUR NEGATIVE 04/22/2020 1318   PROTEINUR Negative 08/16/2020 1324   PROTEINUR NEGATIVE 04/22/2020 1318   NITRITE Negative 08/16/2020 1324   NITRITE NEGATIVE 04/22/2020 1318   LEUKOCYTESUR Negative 08/16/2020 1324   LEUKOCYTESUR MODERATE (A) 04/22/2020 1318    Radiological Exams on Admission: CT ABDOMEN PELVIS W CONTRAST  Result Date: 01/10/2021 CLINICAL DATA:  Abdominal distension and history of constipation, initial encounter EXAM: CT ABDOMEN AND PELVIS WITH CONTRAST TECHNIQUE: Multidetector CT imaging of the abdomen and pelvis was performed using the standard protocol following bolus administration of intravenous contrast. CONTRAST:  29mL OMNIPAQUE IOHEXOL 350 MG/ML SOLN COMPARISON:  Plain  film from earlier in the day FINDINGS: Lower chest: Mild dependent atelectatic changes are noted in the right base. Coronary calcifications are seen. Hepatobiliary: Gallbladder has been surgically removed. Biliary ductal dilatation is noted consistent with the post cholecystectomy state. A small hypodensity is noted in the inferior aspect of the right lobe of the liver likely representing a small cyst. Pancreas: Unremarkable. No pancreatic ductal dilatation or surrounding inflammatory changes. Spleen: Normal in size without focal abnormality. Adrenals/Urinary Tract: Adrenal glands are within normal limits. Kidneys demonstrate a normal enhancement pattern bilaterally. Scattered small cysts are seen bilaterally. Prominent extrarenal pelvis on the right is noted. No true obstructive changes are seen. The bladder is significantly distended. Stomach/Bowel: Gaseous distension of the colon is noted similar to that seen on prior plain film examination. Mild areas of wall thickening are noted in the mid transverse colon although likely related to decompression as opposed to focal abnormality. The appendix is not visualized. No inflammatory changes are noted to suggest appendicitis. The small bowel and stomach are within normal limits. Vascular/Lymphatic: Aortic atherosclerosis. No enlarged abdominal or pelvic lymph nodes. Reproductive: Status post hysterectomy. No adnexal masses. Other: No abdominal wall hernia or abnormality. No abdominopelvic ascites. Musculoskeletal: Postsurgical changes in the proximal right femur are seen. Degenerative changes of the lumbar spine are noted. IMPRESSION: Gaseous distension of the colon likely representing a diffuse ileus. Correlate with the clinical exam. No significant constipation or obstructing lesion is noted. Status post cholecystectomy with biliary ductal dilatation. This is felt to be within normal limits for the patient's age and post cholecystectomy state. Mild right basilar  atelectasis. Electronically Signed   By: Alcide Clever M.D.   On: 01/10/2021 19:33   DG Abd 2 Views  Result Date: 01/10/2021 CLINICAL DATA:  Constipation.  Vomiting since yesterday. EXAM: ABDOMEN - 2 VIEW COMPARISON:  None. FINDINGS: There is diffuse gaseous distension of the colon to the level of the left lower quadrant of the abdomen. Moderate volume of retained stool is noted within the right lower quadrant of the abdomen. No small bowel dilatation identified. There is no evidence of free air. No radio-opaque calculi or other significant radiographic abnormality is seen. IMPRESSION: Diffuse gaseous distension of the colon to the level of the left lower quadrant of the abdomen. Findings may reflect colonic ileus or distal colonic obstruction. If there is a clinical concern for distal colonic obstruction consider further investigation with CT of the abdomen pelvis with IV and oral contrast material Large amount of retained stool noted in  the right lower quadrant of the abdomen. It is unclear whether not this represents stool in the cecum or redundant sigmoid colon. Electronically Signed   By: Signa Kell M.D.   On: 01/10/2021 14:40    EKG: Independently reviewed.  Chronic A. fib  Assessment/Plan Active Problems:   Ileus (HCC)  (please populate well all problems here in Problem List. (For example, if patient is on BP meds at home and you resume or decide to hold them, it is a problem that needs to be her. Same for CAD, COPD, HLD and so on)  Ileus -Significant colon distention fused with gas but only mild stool burden.  Discussed with ED physician, reviewed CT abdomen together, ED physician ordered rectal tube to disimpact colon. -As needed medications, avoid narcotic. -NPO and IVF  Acute metabolic encephalopathy -Likely from worsening of ileus and urinary retention. -Ileus treated as above -Ordered one-time straight cath, done bladder scan every shift and as needed cath. -Usually she will  consider hold benzos however daughter reported patient has responded to Ativan and nursing home.  Ordered low-dose of Ativan as needed.  Hypokalemia -IV replaced, recheck level tomorrow.  HTN -Hold p.o. BP meds until stable to take p.o.  As needed hydralazine for now  Chronic A. Fib with repeated CVAs -She has a high risk of recurrent stroke, according to patient's daughter, once patient developed stroke only after missing 1 day of Eliquis.  Doubt patient can take p.o., start heparin drip for now.  Hospice care -Consult palliative care.  DVT prophylaxis: Heparin drip Code Status: DNR Family Communication: Daughter at bedside Disposition Plan: Expect more than 2 midnight hospital stay Consults called: Palliative care Admission status: MedSurg admission   Emeline General MD [facility] Pager [phone]  01/10/2021, 8:51 PM

## 2021-01-10 NOTE — Consult Note (Signed)
ANTICOAGULATION CONSULT NOTE - Initial Consult  Pharmacy Consult for heparin infusion Indication: atrial fibrillation and pulmonary embolus  Allergies  Allergen Reactions   Valproic Acid And Related Other (See Comments)    Low blood pressure, weakness.   Metaxalone Other (See Comments)    "It made me not feel right"   Peanut-Containing Drug Products Other (See Comments)    Raises blood sugar    Patient Measurements:   Heparin Dosing Weight: 49.5 kg   Vital Signs: Temp: 99 F (37.2 C) (07/11 1326) Temp Source: Oral (07/11 1326) BP: 136/57 (07/11 2000) Pulse Rate: 84 (07/11 2000)  Labs: Recent Labs    01/10/21 1326  HGB 12.4  HCT 36.8  PLT 339  CREATININE 0.65    CrCl cannot be calculated (Unknown ideal weight.).   Medical History: Past Medical History:  Diagnosis Date   Diabetes mellitus without complication (HCC)    Hypertension    Stroke (HCC)     Medications:  Apixaban 5 mg BID PTA: Last dose 7/11 AM  Assessment: 87 y.o female with hx of chronic afib anticoagulated PTA on apixaban. Found to have ileus. Pharmacy has been consulted to transition from oral apixaban to IV heparin while patient NPO.  Goal of Therapy:  Heparin level 0.3-0.7 units/ml aPTT 66-102 seconds Monitor platelets by anticoagulation protocol: Yes   Plan:  Baseline aPTT, HL ordered Initiate heparin infusion at 800 units/hr without bolus due to prior anticoagulation with apixaban  Check aPTT 8 hours  Monitor by aPTT until HL begin to correlate then change to HL monitoring CBC daily   Sharen Hones, PharmD, BCPS Clinical Pharmacist   01/10/2021,9:57 PM

## 2021-01-10 NOTE — ED Provider Notes (Signed)
[facility] Emergency Department Provider Note  ____________________________________________  Time seen: Approximately 5:19 PM  I have reviewed the triage vital signs and the nursing notes.   HISTORY  Chief Complaint Constipation    Level 5 Caveat: Portions of the History and Physical including HPI and review of systems are unable to be completely obtained due to patient chronic dementia and confusion  HPI Ambriella Boyd Cantu is a 87 y.o. female with a history of hypertension and diabetes and atrial fibrillation who comes ED complaining of constipation for the past week.  Family has tried giving lactulose and enemas under the care of of hospice at home without relief.  For the past 2 days the patient has been having vomiting as well, unable to take her medications or eat or drink anything.  Reportedly she has a remote history of bowel surgery when she was 75, but family is unclear what the procedure was since it was nearly 80 years ago.    Past Medical History:  Diagnosis Date   Diabetes mellitus without complication (HCC)    Hypertension    Stroke [facility])      Patient Active Problem List   Diagnosis Date Noted   Ileus (HCC) 01/10/2021   TIA (transient ischemic attack) 08/16/2020   Paroxysmal atrial fibrillation (HCC) 08/16/2020   Fracture (healed) treatment follow-up 05/12/2020   Protein-calorie malnutrition, severe 04/23/2020   Fall 04/21/2020   Closed right hip fracture (HCC) 04/21/2020   Atrial fibrillation, chronic (HCC) 04/21/2020   Seizure (HCC) 12/15/2019   History of CVA (cerebrovascular accident) 11/07/2019   CVA (cerebral vascular accident) (HCC) 10/27/2019   Encephalopathy acute 07/31/2019   Hypertensive urgency 07/31/2019   Essential hypertension 07/31/2019   Controlled type 2 diabetes mellitus without complication, without long-term current use of insulin (HCC) 07/31/2019   Chronic back pain 07/31/2019   Hypertensive emergency  07/31/2019   Acute encephalopathy 07/31/2019     Past Surgical History:  Procedure Laterality Date   ABDOMINAL HYSTERECTOMY     CESAREAN SECTION     4   CHOLECYSTECTOMY     INTRAMEDULLARY (IM) NAIL INTERTROCHANTERIC Right 04/24/2020   Procedure: INTRAMEDULLARY (IM) NAIL INTERTROCHANTRIC;  Surgeon: Juanell Fairly, MD;  Location: [facility] ORS;  Service: Orthopedics;  Laterality: Right;   KIDNEY STONE SURGERY       Prior to Admission medications   Medication Sig Start Date End Date Taking? Authorizing Provider  acetaminophen (TYLENOL) 325 MG tablet Take 1-2 tablets (325-650 mg total) by mouth every 6 (six) hours as needed for mild pain (pain score 1-3 or temp > 100.5). 05/01/20  Yes Albertine Grates, MD  apixaban (ELIQUIS) 2.5 MG TABS tablet Take 2 tablets (5 mg total) by mouth 2 (two) times daily. 12/28/20  Yes Masoud, Renda Rolls, MD  bimatoprost (LUMIGAN) 0.01 % SOLN Place 1 drop into both eyes at bedtime.   Yes [provider]  lactulose (CHRONULAC) 10 GM/15ML solution Take 30 mLs by mouth every 2 (two) hours. until bowel movement. 01/07/21  Yes [provider]  LORazepam (ATIVAN) 0.5 MG tablet Take 0.5 mg by mouth every 4 (four) hours as needed. 12/31/20  Yes [provider]  metFORMIN (GLUCOPHAGE) 500 MG tablet TAKE 1 TABLET EVERY 12     HOURS 10/19/20  Yes Masoud, Renda Rolls, MD  senna-docusate (SENOKOT-S) 8.6-50 MG tablet Take 1 tablet by mouth at bedtime. 05/01/20  Yes Albertine Grates, MD  valsartan (DIOVAN) 40 MG tablet TAKE 1 TABLET BY MOUTH EVERY DAY 06/15/20  Yes Masoud,  Renda Rolls, MD  ALPRAZolam Prudy Feeler) 0.25 MG tablet Take 1 tablet (0.25 mg total) by mouth daily. 07/13/20   Corky Downs, MD  amLODipine (NORVASC) 5 MG tablet Take 1 tablet (5 mg total) by mouth daily. Patient not taking: Reported on 01/10/2021 05/02/20   Albertine Grates, MD  Cholecalciferol (VITAMIN D-3 PO) Take 1,000 mg by mouth daily after supper.  Patient not taking: Reported on 01/10/2021    [provider]  feeding  supplement, GLUCERNA SHAKE, (GLUCERNA SHAKE) LIQD Take 237 mLs by mouth 3 (three) times daily between meals. 05/01/20   Albertine Grates, MD  magic mouthwash SOLN Take 5 mLs by mouth 3 (three) times daily as needed for mouth pain. Patient not taking: Reported on 01/10/2021 05/01/20   Albertine Grates, MD  Multiple Vitamins-Minerals (PRESERVISION AREDS 2) CAPS Take 1 capsule by mouth 2 (two) times daily. Patient not taking: Reported on 01/10/2021    [provider]     Allergies Valproic acid and related, Metaxalone, and Peanut-containing drug products   Family History  Problem Relation Age of Onset   Diabetes Mellitus II Sister     Social History Social History   Tobacco Use   Smoking status: Never   Smokeless tobacco: Never  Substance Use Topics   Alcohol use: Never   Drug use: Never    Review of Systems Level 5 Caveat: Portions of the History and Physical including HPI and review of systems are unable to be completely obtained due to patient being a poor historian   Constitutional:   No known fever.  ENT:   No rhinorrhea. Cardiovascular:   No chest pain or syncope. Respiratory:   No dyspnea or cough. Gastrointestinal:   Negative for abdominal pain, positive vomiting and constipation Musculoskeletal:   Negative for focal pain or swelling ____________________________________________   PHYSICAL EXAM:  VITAL SIGNS: ED Triage Vitals [01/10/21 1326]  Enc Vitals Group     BP (!) 153/71     Pulse Rate 91     Resp 15     Temp 99 F (37.2 C)     Temp Source Oral     SpO2 99 %     Weight      Height      Head Circumference      Peak Flow      Pain Score      Pain Loc      Pain Edu?      Excl. in GC?     Vital signs reviewed, nursing assessments reviewed.   Constitutional:   Alert and oriented to self. Non-toxic appearance. Eyes:   Conjunctivae are normal. EOMI. PERRL. ENT      Head:   Normocephalic and atraumatic.      Nose:   No congestion/rhinnorhea.        Mouth/Throat:   Dry mucous membranes, no pharyngeal erythema. No peritonsillar mass.       Neck:   No meningismus. Full ROM. Hematological/Lymphatic/Immunilogical:   No cervical lymphadenopathy. Cardiovascular:   RRR. Symmetric bilateral radial and DP pulses.  No murmurs. Cap refill less than 2 seconds. Respiratory:   Normal respiratory effort without tachypnea/retractions. Breath sounds are clear and equal bilaterally. No wheezes/rales/rhonchi. Gastrointestinal:   Soft and nontender.  Moderately distended and tympanitic. There is no CVA tenderness.  No rebound, rigidity, or guarding.  Rectal exam negative for hemorrhoids.  Brown stool Hemoccult negative.  No fecal impaction.  Musculoskeletal:   Normal range of motion in all extremities. No joint effusions.  No lower extremity tenderness.  No edema. Neurologic:   Normal speech and language.  Motor grossly intact. No acute focal neurologic deficits are appreciated.  Skin:    Skin is warm, dry and intact. No rash noted.  No petechiae, purpura, or bullae.  ____________________________________________    LABS (pertinent positives/negatives) (all labs ordered are listed, but only abnormal results are displayed) Labs Reviewed  COMPREHENSIVE METABOLIC PANEL - Abnormal; Notable for the following components:      Result Value   Sodium 134 (*)    Potassium 3.2 (*)    Glucose, Bld 272 (*)    All other components within normal limits  CBC - Abnormal; Notable for the following components:   WBC 13.7 (*)    All other components within normal limits  URINALYSIS, COMPLETE (UACMP) WITH MICROSCOPIC - Abnormal; Notable for the following components:   Color, Urine YELLOW (*)    APPearance HAZY (*)    Glucose, UA >=500 (*)    Hgb urine dipstick SMALL (*)    Protein, ur 30 (*)    Leukocytes,Ua LARGE (*)    WBC, UA >50 (*)    All other components within normal limits  RESP PANEL BY RT-PCR (FLU A&B, COVID) ARPGX2  LIPASE, BLOOD  MAGNESIUM   COMPREHENSIVE METABOLIC PANEL  CBC  HEPARIN LEVEL (UNFRACTIONATED)  APTT  APTT  HEPARIN LEVEL (UNFRACTIONATED)   ____________________________________________   EKG    ____________________________________________    RADIOLOGY  CT ABDOMEN PELVIS W CONTRAST  Result Date: 01/10/2021 CLINICAL DATA:  Abdominal distension and history of constipation, initial encounter EXAM: CT ABDOMEN AND PELVIS WITH CONTRAST TECHNIQUE: Multidetector CT imaging of the abdomen and pelvis was performed using the standard protocol following bolus administration of intravenous contrast. CONTRAST:  75mL OMNIPAQUE IOHEXOL 350 MG/ML SOLN COMPARISON:  Plain film from earlier in the day FINDINGS: Lower chest: Mild dependent atelectatic changes are noted in the right base. Coronary calcifications are seen. Hepatobiliary: Gallbladder has been surgically removed. Biliary ductal dilatation is noted consistent with the post cholecystectomy state. A small hypodensity is noted in the inferior aspect of the right lobe of the liver likely representing a small cyst. Pancreas: Unremarkable. No pancreatic ductal dilatation or surrounding inflammatory changes. Spleen: Normal in size without focal abnormality. Adrenals/Urinary Tract: Adrenal glands are within normal limits. Kidneys demonstrate a normal enhancement pattern bilaterally. Scattered small cysts are seen bilaterally. Prominent extrarenal pelvis on the right is noted. No true obstructive changes are seen. The bladder is significantly distended. Stomach/Bowel: Gaseous distension of the colon is noted similar to that seen on prior plain film examination. Mild areas of wall thickening are noted in the mid transverse colon although likely related to decompression as opposed to focal abnormality. The appendix is not visualized. No inflammatory changes are noted to suggest appendicitis. The small bowel and stomach are within normal limits. Vascular/Lymphatic: Aortic atherosclerosis. No  enlarged abdominal or pelvic lymph nodes. Reproductive: Status post hysterectomy. No adnexal masses. Other: No abdominal wall hernia or abnormality. No abdominopelvic ascites. Musculoskeletal: Postsurgical changes in the proximal right femur are seen. Degenerative changes of the lumbar spine are noted. IMPRESSION: Gaseous distension of the colon likely representing a diffuse ileus. Correlate with the clinical exam. No significant constipation or obstructing lesion is noted. Status post cholecystectomy with biliary ductal dilatation. This is felt to be within normal limits for the patient's age and post cholecystectomy state. Mild right basilar atelectasis. Electronically Signed   By: Alcide Clever M.D.   On: 01/10/2021 19:33  DG Abd 2 Views  Result Date: 01/10/2021 CLINICAL DATA:  Constipation.  Vomiting since yesterday. EXAM: ABDOMEN - 2 VIEW COMPARISON:  None. FINDINGS: There is diffuse gaseous distension of the colon to the level of the left lower quadrant of the abdomen. Moderate volume of retained stool is noted within the right lower quadrant of the abdomen. No small bowel dilatation identified. There is no evidence of free air. No radio-opaque calculi or other significant radiographic abnormality is seen. IMPRESSION: Diffuse gaseous distension of the colon to the level of the left lower quadrant of the abdomen. Findings may reflect colonic ileus or distal colonic obstruction. If there is a clinical concern for distal colonic obstruction consider further investigation with CT of the abdomen pelvis with IV and oral contrast material Large amount of retained stool noted in the right lower quadrant of the abdomen. It is unclear whether not this represents stool in the cecum or redundant sigmoid colon. Electronically Signed   By: Signa Kellaylor  Stroud M.D.   On: 01/10/2021 14:40     ____________________________________________   PROCEDURES Procedures  ____________________________________________  DIFFERENTIAL DIAGNOSIS   Bowel obstruction, volvulus, bowel perforation, constipation, ileus  CLINICAL IMPRESSION / ASSESSMENT AND PLAN / ED COURSE  Medications ordered in the ED: Medications  potassium chloride 10 mEq in 100 mL IVPB (10 mEq Intravenous New Bag/Given 01/10/21 2220)  acetaminophen (TYLENOL) tablet 325-650 mg (has no administration in time range)  magic mouthwash (has no administration in time range)  polyethylene glycol (MIRALAX / GLYCOLAX) packet 17 g (17 g Oral Given 01/10/21 2220)  latanoprost (XALATAN) 0.005 % ophthalmic solution 1 drop (has no administration in time range)  LORazepam (ATIVAN) injection 0.5 mg (has no administration in time range)  hydrALAZINE (APRESOLINE) injection 5 mg (has no administration in time range)  insulin aspart (novoLOG) injection 0-9 Units (has no administration in time range)  ondansetron (ZOFRAN) injection 4 mg (has no administration in time range)  0.9 %  sodium chloride infusion ( Intravenous New Bag/Given 01/10/21 2219)  heparin ADULT infusion 100 units/mL (25000 units/25550mL) (has no administration in time range)  LORazepam (ATIVAN) tablet 0.5 mg (0.5 mg Oral Given 01/10/21 1651)  morphine 2 MG/ML injection 2 mg (2 mg Intramuscular Given 01/10/21 1651)  lactated ringers bolus 1,000 mL (0 mLs Intravenous Stopped 01/10/21 1900)  iohexol (OMNIPAQUE) 350 MG/ML injection 75 mL (75 mLs Intravenous Contrast Given 01/10/21 1807)    Pertinent labs & imaging results that were available during my care of the patient were reviewed by me and considered in my medical decision making (see chart for details).   Suzanne Cantu was evaluated in Emergency Department on 01/10/2021 for the symptoms described in the history of present illness. She was evaluated in the context of the global COVID-19 pandemic, which necessitated  consideration that the patient might be at risk for infection with the SARS-CoV-2 virus that causes COVID-19. Institutional protocols and algorithms that pertain to the evaluation of patients at risk for COVID-19 are in a state of rapid change based on information released by regulatory bodies including the CDC and federal and state organizations. These policies and algorithms were followed during the patient's care in the ED.   Patient presents with distended abdomen, constipation, vomiting, worsening gradually over the past week.  Abdomen is nonsurgical.  Abdominal x-ray viewed and interpreted by me, shows diffusely distended large intestine with air.  Unable to rule out volvulus, so CT was ordered.  CT consistent with ileus of the large intestine.  No evidence of mechanical obstruction or volvulus.  No signs of perforation.  Due to worsening symptoms and vomiting, she will need decompression with a rectal tube, hospitalization and continued IV fluids for hydration, electrolyte optimization until improved.     ____________________________________________   FINAL CLINICAL IMPRESSION(S) / ED DIAGNOSES    Final diagnoses:  Pain  Constipation  Ileus [facility])     ED Discharge Orders     None       Portions of this note were generated with dragon dictation software. Dictation errors may occur despite best attempts at proofreading.   Sharman Cheek, MD 01/10/21 2228

## 2021-01-10 NOTE — ED Notes (Signed)
Pts family member is present at the bedside and is requesting to stay the night due to the patients mentation.

## 2021-01-11 DIAGNOSIS — K567 Ileus, unspecified: Secondary | ICD-10-CM

## 2021-01-11 DIAGNOSIS — Z7189 Other specified counseling: Secondary | ICD-10-CM

## 2021-01-11 LAB — GLUCOSE, CAPILLARY
Glucose-Capillary: 104 mg/dL — ABNORMAL HIGH (ref 70–99)
Glucose-Capillary: 145 mg/dL — ABNORMAL HIGH (ref 70–99)
Glucose-Capillary: 121 mg/dL — ABNORMAL HIGH (ref 70–99)

## 2021-01-11 LAB — CBC
HCT: 35.6 % — ABNORMAL LOW (ref 36.0–46.0)
Hemoglobin: 12.3 g/dL (ref 12.0–15.0)
MCH: 30.6 pg (ref 26.0–34.0)
MCHC: 34.6 g/dL (ref 30.0–36.0)
MCV: 88.6 fL (ref 80.0–100.0)
Platelets: 215 10*3/uL (ref 150–400)
RBC: 4.02 MIL/uL (ref 3.87–5.11)
RDW: 13 % (ref 11.5–15.5)
WBC: 7.6 10*3/uL (ref 4.0–10.5)
nRBC: 0 % (ref 0.0–0.2)

## 2021-01-11 LAB — COMPREHENSIVE METABOLIC PANEL
ALT: 11 U/L (ref 0–44)
AST: 14 U/L — ABNORMAL LOW (ref 15–41)
Albumin: 2.9 g/dL — ABNORMAL LOW (ref 3.5–5.0)
Alkaline Phosphatase: 71 U/L (ref 38–126)
Anion gap: 8 (ref 5–15)
BUN: 6 mg/dL — ABNORMAL LOW (ref 8–23)
CO2: 25 mmol/L (ref 22–32)
Calcium: 8.9 mg/dL (ref 8.9–10.3)
Chloride: 104 mmol/L (ref 98–111)
Creatinine, Ser: 0.49 mg/dL (ref 0.44–1.00)
GFR, Estimated: >60 mL/min (ref >60)
Glucose, Bld: 179 mg/dL — ABNORMAL HIGH (ref 70–99)
Potassium: 3.3 mmol/L — ABNORMAL LOW (ref 3.5–5.1)
Sodium: 137 mmol/L (ref 135–145)
Total Bilirubin: 0.9 mg/dL (ref 0.3–1.2)
Total Protein: 5.7 g/dL — ABNORMAL LOW (ref 6.5–8.1)

## 2021-01-11 LAB — HEPARIN LEVEL (UNFRACTIONATED): Heparin Unfractionated: >1.1 IU/mL — ABNORMAL HIGH (ref 0.30–0.70)

## 2021-01-11 LAB — APTT: aPTT: 82 seconds — ABNORMAL HIGH (ref 24–36)

## 2021-01-11 MED ORDER — POTASSIUM CHLORIDE 10 MEQ/100ML IV SOLN
10.0000 meq | INTRAVENOUS | Status: AC
  Administered 2021-01-11 (×2): 10 meq via INTRAVENOUS
  Filled 2021-01-11 (×2): qty 100

## 2021-01-11 MED ORDER — SODIUM CHLORIDE 0.9 % IV SOLN
1.0000 g | INTRAVENOUS | Status: AC
Start: 2021-01-11 — End: 2021-01-13
  Administered 2021-01-11 – 2021-01-13 (×3): 1 g via INTRAVENOUS
  Filled 2021-01-11: qty 1
  Filled 2021-01-11: qty 10
  Filled 2021-01-11: qty 1

## 2021-01-11 MED ORDER — POTASSIUM CHLORIDE IN NACL 20-0.9 MEQ/L-% IV SOLN
INTRAVENOUS | Status: DC
  Filled 2021-01-11 (×8): qty 1000

## 2021-01-11 MED ORDER — ACETAMINOPHEN 650 MG RE SUPP
650.0000 mg | RECTAL | Status: DC | PRN
  Administered 2021-01-11 – 2021-01-12 (×2): 650 mg via RECTAL
  Filled 2021-01-11 (×2): qty 1

## 2021-01-11 MED ORDER — CHLORHEXIDINE GLUCONATE CLOTH 2 % EX PADS
6.0000 | MEDICATED_PAD | Freq: Every day | CUTANEOUS | Status: DC
  Administered 2021-01-11 – 2021-01-14 (×4): 6 via TOPICAL

## 2021-01-11 NOTE — Progress Notes (Signed)
[facility] PROGRESS NOTE    Suzanne Cantu  MOQ:[id number] DOB: 1926-11-30 DOA: 01/10/2021 PCP: Rochel Brome., Corky Downs, MD      Brief Narrative:  [name] is a 87 y.o. F with vascular dementia, A. Fib and recurrent CVAs now on Hospice since last stroke in May, DM, and HTN who is family brought her in after constipation for 1 week.  Constipation started about a week ago, family tried lactulose and enemas under the care of hospice at home without relief, family patient had around 2 days of vomiting and so she was brought to the emergency room.  In the ER x-ray showed colonic distention.  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  ER physician placed a rectal tube and the hospitalist service were asked to evaluate.     Note: The patient has been mostly bed bound and severely impaired and dependent for ADLs for over a year. Family noticed stroke like symptoms in mid-May after a dose of Eliquis was missed, and since then she has slept all day unless woken, she eats 1/2 what she formerly did, and takes an hour to be fed, has limited ability to communicate.          Assessment & Plan:  Colonic pseudo-obstruction Unclear cause.  Possibly low K.  Possibly occult infection.  More likely this is spontaneous given the natural end of life process.  I recommend a time-limited 48 hour trial of fluids, antibiotics and electrolyte supplementation.  A simple rectal tube was placed in the ER, which is atypical for Ogilvie's treatment, not likely to harm, nor help.  Given her age and frailty, she is certainly not a candidate for neostigmine, less so for colonoscopic decompression.  If patient fails conservative management, as I suspect she will, I recommend residential hospice.  -IV K -IV fluids -NPO -Start Rocephin, empirically, as this may be UTI - Obtain urine culture -Given surgery is not an option, I will defer serial x-rays -It would not be unreasonable to trial a  dulcolax suppository, if this can be placed around her rectal tube      Acute metabolic encephalopathy ruled out Patient's mentation is at her recent baseline, very limited  Hypokalemia -Include potassium and IV fluids  Hypertension Pressure elevated - Hold valsartan while n.p.o.  Chronic atrial fibrillation Cerebrovascular disease, secondary prevention Anticoagulation is continued ambulates requested - Hold Eliquis while n.p.o. - Continue heparin drip  Diabetes Glucose controlled - Sliding scale corrections, low-dose  Severe protein calorie malnutrition As evidenced by severe loss of subcutaneous muscle mass and fat, reported eating less than 50% of meals for the last 2 months, BMI 18, and advanced dementia.        Disposition: Status is: Inpatient  Remains inpatient appropriate because:IV treatments appropriate due to intensity of illness or inability to take PO  Dispo: The patient is from: Home              Anticipated d/c is to:  TBD              Patient currently is not medically stable to d/c.   Difficult to place patient No       Level of care: Med-Surg       MDM: The below labs and imaging reports were reviewed and summarized above.  Medication management as above.    DVT prophylaxis: N/a on heparin  Code Status: DNR Family Communication: Daughter at bedside  Subjective: Patient is unable to communicate.  Family report no more vomiting, no agitation or pain.  No rectal output.  No fever.  Objective: Vitals:   01/11/21 0203 01/11/21 0301 01/11/21 0602 01/11/21 0813  BP: (!) 167/64 (!) 119/49 (!) 164/72 (!) 159/63  Pulse: 79 73 78 75  Resp:   16 16  Temp:   98 F (36.7 C) 98.5 F (36.9 C)  TempSrc:    Oral  SpO2:   100% 100%  Weight:      Height:        Intake/Output Summary (Last 24 hours) at 01/11/2021 1542 Last data filed at 01/11/2021 1510 Gross per 24 hour  Intake 2574.7 ml  Output 3100 ml  Net -525.3 ml    Filed Weights   01/10/21 2221  Weight: 49.5 kg    Examination: General appearance: Frail elderly adult female, awake but sleepy, in no obvious distress.   HEENT: Anicteric, conjunctiva pink, lids and lashes crusted with discharge. No nasal deformity, discharge, epistaxis.  Lips cracked and dry, oropharynx dry Skin: Warm and dry.  no jaundice.  No suspicious rashes or lesions. Cardiac: RRR, nl S1-S2, no murmurs appreciated.  No LE edema.  Radial pulses 2+ and symmetric. Respiratory: Normal respiratory rate and rhythm.  CTAB without rales or wheezes. Abdomen: Abdomen soft.  Slightly distended, no grimace to palpation, no rigidity MSK: No deformities or effusions.  Severe diffuse loss of subcutaneous muscle mass and fat. Neuro: Awake but somnolent, attempts to move the arms with severe weakness, speech is garbled and unintelligible  Psych: Somnolent, unable to communicate   Data Reviewed: I have personally reviewed following labs and imaging studies:  CBC: Recent Labs  Lab 01/10/21 1326 01/11/21 0841  WBC 13.7* 7.6  HGB 12.4 12.3  HCT 36.8 35.6*  MCV 90.9 88.6  PLT 339 215   Basic Metabolic Panel: Recent Labs  Lab 01/10/21 1326 01/11/21 0759  NA 134* 137  K 3.2* 3.3*  CL 98 104  CO2 29 25  GLUCOSE 272* 179*  BUN 10 6*  CREATININE 0.65 0.49  CALCIUM 9.5 8.9  MG 2.0  --    GFR: Estimated Creatinine Clearance: 35.8 mL/min (by C-G formula based on SCr of 0.49 mg/dL). Liver Function Tests: Recent Labs  Lab 01/10/21 1326 01/11/21 0759  AST 24 14*  ALT 12 11  ALKPHOS 85 71  BILITOT 1.0 0.9  PROT 7.2 5.7*  ALBUMIN 3.6 2.9*   Recent Labs  Lab 01/10/21 1326  LIPASE 26   No results for input(s): AMMONIA in the last 168 hours. Coagulation Profile: No results for input(s): INR, PROTIME in the last 168 hours. Cardiac Enzymes: No results for input(s): CKTOTAL, CKMB, CKMBINDEX, TROPONINI in the last 168 hours. BNP (last 3 results) No results for input(s): PROBNP  in the last 8760 hours. HbA1C: No results for input(s): HGBA1C in the last 72 hours. CBG: Recent Labs  Lab 01/11/21 1233  GLUCAP 104*   Lipid Profile: No results for input(s): CHOL, HDL, LDLCALC, TRIG, CHOLHDL, LDLDIRECT in the last 72 hours. Thyroid Function Tests: No results for input(s): TSH, T4TOTAL, FREET4, T3FREE, THYROIDAB in the last 72 hours. Anemia Panel: No results for input(s): VITAMINB12, FOLATE, FERRITIN, TIBC, IRON, RETICCTPCT in the last 72 hours. Urine analysis:    Component Value Date/Time   COLORURINE YELLOW (A) 01/10/2021 2123   APPEARANCEUR HAZY (A) 01/10/2021 2123   APPEARANCEUR Clear 08/16/2020 1324   LABSPEC 1.015 01/10/2021 2123   PHURINE 6.0 01/10/2021 2123  GLUCOSEU >=500 (A) 01/10/2021 2123   HGBUR SMALL (A) 01/10/2021 2123   BILIRUBINUR NEGATIVE 01/10/2021 2123   BILIRUBINUR Negative 08/16/2020 1324   KETONESUR NEGATIVE 01/10/2021 2123   PROTEINUR 30 (A) 01/10/2021 2123   NITRITE NEGATIVE 01/10/2021 2123   LEUKOCYTESUR LARGE (A) 01/10/2021 2123   Sepsis Labs: @LABRCNTIP (procalcitonin:4,lacticacidven:4)  ) Recent Results (from the past 240 hour(s))  Resp Panel by RT-PCR (Flu A&B, Covid) Nasopharyngeal Swab     Status: None   Collection Time: 01/10/21 10:21 PM   Specimen: Nasopharyngeal Swab; Nasopharyngeal(NP) swabs in vial transport medium  Result Value Ref Range Status   SARS Coronavirus 2 by RT PCR NEGATIVE NEGATIVE Final    Comment: (NOTE) SARS-CoV-2 target nucleic acids are NOT DETECTED.  The SARS-CoV-2 RNA is generally detectable in upper respiratory specimens during the acute phase of infection. The lowest concentration of SARS-CoV-2 viral copies this assay can detect is 138 copies/mL. A negative result does not preclude SARS-Cov-2 infection and should not be used as the sole basis for treatment or other patient management decisions. A negative result may occur with  improper specimen collection/handling, submission of specimen  other than nasopharyngeal swab, presence of viral mutation(s) within the areas targeted by this assay, and inadequate number of viral copies(<138 copies/mL). A negative result must be combined with clinical observations, patient history, and epidemiological information. The expected result is Negative.  Fact Sheet for Patients:  03/13/21  Fact Sheet for Healthcare Providers:  BloggerCourse.com  This test is no t yet approved or cleared by the [employer]iousBroker.[place] FDA and  has been authorized for detection and/or diagnosis of SARS-CoV-2 by FDA under an Emergency Use Authorization (EUA). This EUA will remain  in effect (meaning this test can be used) for the duration of the COVID-19 declaration under Section 564(b)(1) of the Act, 21 U.S.C.section 360bbb-3(b)(1), unless the authorization is terminated  or revoked sooner.       Influenza A by PCR NEGATIVE NEGATIVE Final   Influenza B by PCR NEGATIVE NEGATIVE Final    Comment: (NOTE) The Xpert Xpress SARS-CoV-2/FLU/RSV plus assay is intended as an aid in the diagnosis of influenza from Nasopharyngeal swab specimens and should not be used as a sole basis for treatment. Nasal washings and aspirates are unacceptable for Xpert Xpress SARS-CoV-2/FLU/RSV testing.  Fact Sheet for Patients: [place]  Fact Sheet for Healthcare Providers: BloggerCourse.com  This test is not yet approved or cleared by the SeriousBroker.it FDA and has been authorized for detection and/or diagnosis of SARS-CoV-2 by FDA under an Emergency Use Authorization (EUA). This EUA will remain in effect (meaning this test can be used) for the duration of the COVID-19 declaration under Section 564(b)(1) of the Act, 21 U.S.C. section 360bbb-3(b)(1), unless the authorization is terminated or revoked.  Performed at [facility], [address]  [place], [place]          Radiology Studies: CT ABDOMEN PELVIS W CONTRAST  Result Date: 01/10/2021 CLINICAL DATA:  Abdominal distension and history of constipation, initial encounter EXAM: CT ABDOMEN AND PELVIS WITH CONTRAST TECHNIQUE: Multidetector CT imaging of the abdomen and pelvis was performed using the standard protocol following bolus administration of intravenous contrast. CONTRAST:  17mL OMNIPAQUE IOHEXOL 350 MG/ML SOLN COMPARISON:  Plain film from earlier in the day FINDINGS: Lower chest: Mild dependent atelectatic changes are noted in the right base. Coronary calcifications are seen. Hepatobiliary: Gallbladder has been surgically removed. Biliary ductal dilatation is noted consistent with the post cholecystectomy state. A small  hypodensity is noted in the inferior aspect of the right lobe of the liver likely representing a small cyst. Pancreas: Unremarkable. No pancreatic ductal dilatation or surrounding inflammatory changes. Spleen: Normal in size without focal abnormality. Adrenals/Urinary Tract: Adrenal glands are within normal limits. Kidneys demonstrate a normal enhancement pattern bilaterally. Scattered small cysts are seen bilaterally. Prominent extrarenal pelvis on the right is noted. No true obstructive changes are seen. The bladder is significantly distended. Stomach/Bowel: Gaseous distension of the colon is noted similar to that seen on prior plain film examination. Mild areas of wall thickening are noted in the mid transverse colon although likely related to decompression as opposed to focal abnormality. The appendix is not visualized. No inflammatory changes are noted to suggest appendicitis. The small bowel and stomach are within normal limits. Vascular/Lymphatic: Aortic atherosclerosis. No enlarged abdominal or pelvic lymph nodes. Reproductive: Status post hysterectomy. No adnexal masses. Other: No abdominal wall hernia or abnormality. No abdominopelvic ascites.  Musculoskeletal: Postsurgical changes in the proximal right femur are seen. Degenerative changes of the lumbar spine are noted. IMPRESSION: Gaseous distension of the colon likely representing a diffuse ileus. Correlate with the clinical exam. No significant constipation or obstructing lesion is noted. Status post cholecystectomy with biliary ductal dilatation. This is felt to be within normal limits for the patient's age and post cholecystectomy state. Mild right basilar atelectasis. Electronically Signed   By: Alcide Clever M.D.   On: 01/10/2021 19:33   DG Abd 2 Views  Result Date: 01/10/2021 CLINICAL DATA:  Constipation.  Vomiting since yesterday. EXAM: ABDOMEN - 2 VIEW COMPARISON:  None. FINDINGS: There is diffuse gaseous distension of the colon to the level of the left lower quadrant of the abdomen. Moderate volume of retained stool is noted within the right lower quadrant of the abdomen. No small bowel dilatation identified. There is no evidence of free air. No radio-opaque calculi or other significant radiographic abnormality is seen. IMPRESSION: Diffuse gaseous distension of the colon to the level of the left lower quadrant of the abdomen. Findings may reflect colonic ileus or distal colonic obstruction. If there is a clinical concern for distal colonic obstruction consider further investigation with CT of the abdomen pelvis with IV and oral contrast material Large amount of retained stool noted in the right lower quadrant of the abdomen. It is unclear whether not this represents stool in the cecum or redundant sigmoid colon. Electronically Signed   By: Signa Kell M.D.   On: 01/10/2021 14:40        Scheduled Meds:  Chlorhexidine Gluconate Cloth  6 each Topical Daily   insulin aspart  0-9 Units Subcutaneous TID WC   latanoprost  1 drop Both Eyes QHS   polyethylene glycol  17 g Oral Daily   Continuous Infusions:  sodium chloride 100 mL/hr at 01/11/21 1410   heparin 800 Units/hr (01/10/21  2359)     LOS: 1 day    Time spent: 35 minutes    Alberteen Sam, MD [facility] 01/11/2021, 3:42 PM     Please page though AMION or Epic secure chat:  For [employer], Higher education careers adviser

## 2021-01-11 NOTE — Consult Note (Signed)
ANTICOAGULATION CONSULT NOTE -   Pharmacy Consult for heparin infusion Indication: atrial fibrillation and pulmonary embolus  Allergies  Allergen Reactions   Valproic Acid And Related Other (See Comments)    Low blood pressure, weakness.   Metaxalone Other (See Comments)    "It made me not feel right"   Peanut-Containing Drug Products Other (See Comments)    Raises blood sugar    Patient Measurements: Height: 5\' 5"  (165.1 cm) Weight: 49.5 kg (109 lb 1.6 oz) IBW/kg (Calculated) : 57 Heparin Dosing Weight: 49.5 kg   Vital Signs: Temp: 98.5 F (36.9 C) (07/12 0813) Temp Source: Oral (07/11 2230) BP: 159/63 (07/12 0813) Pulse Rate: 75 (07/12 0813)  Labs: Recent Labs    01/10/21 1326 01/11/21 0759  HGB 12.4  --   HCT 36.8  --   PLT 339  --   APTT  --  82*  HEPARINUNFRC  --  >1.10*  CREATININE 0.65 0.49     Estimated Creatinine Clearance: 35.8 mL/min (by C-G formula based on SCr of 0.49 mg/dL).   Medical History: Past Medical History:  Diagnosis Date   Diabetes mellitus without complication (HCC)    Hypertension    Stroke (HCC)     Medications:  Apixaban 5 mg BID PTA: Last dose 7/11 AM  Assessment: 87 y.o female with hx of chronic afib anticoagulated PTA on apixaban. Found to have ileus. Pharmacy has been consulted to transition from oral apixaban to IV heparin while patient NPO.  7/12 @0759  aPTT=82, HL >1.10   cont at 800 units/hr  Goal of Therapy:  Heparin level 0.3-0.7 units/ml aPTT 66-102 seconds Monitor platelets by anticoagulation protocol: Yes   Plan:  7/12 @0759  aPTT=82, therapeutic, HL >1.10 (not correlating yet) Continue heparin infusion at 800 units/hr  Check f/u aPTT in 8 hours  Monitor by aPTT until HL begins to correlate then change to HL monitoring CBC daily   , PharmD Clinical Pharmacist   01/11/2021,8:44 AM

## 2021-01-11 NOTE — Progress Notes (Signed)
No UOP for shift. Bladder scanned and >900cc. Notified Dr Para March and received order for foley cath. After inserting foley, 1000cc immediately drained into bag.

## 2021-01-11 NOTE — Progress Notes (Addendum)
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 Patient had been experiencing vomiting for a couple of days prior to arriving at hospital. She had increasing agitation and decreased appetite. Distended abdomen. Transported to [facility] for evaluation. Patient was admitted to [facility] 149 on 7.11.22 with diagnosis of ileus. Per Dr. Dan Humphreys with [facility], this is a related hospital admission.   Visited patient at the bedside. Patient's daughter, [name] at bedside with patient. Support given to patient and daughter. Patient experiencing some agitation and anxiety. Report exchanged with hospital team and discussed patient's agitation.  Patient remains inpatient appropriate for treatment of ileus due to NPO status. Requiring IV antibiotics, potassium, IVF and IV heparin. Unable to take Eliquis PO.   VS: 98.5, HR 75, RR 16, BP 159/63, O2 sats 100% on RA  I/O: 1934.10/2598  Abnormal Labs: Potassium: 3.3 (L) Glucose: 179 (H) BUN: 6 (L) Albumin: 2.9 (L) AST: 14 (L) Total Protein: 5.7 (L) Heparin Unfractionated: >1.10 (H) APTT: 82 (H) HCT: 35.6 (L)  Diagnostics: EXAM: CT ABDOMEN AND PELVIS WITH CONTRAST IMPRESSION: Gaseous distension of the colon likely representing a diffuse ileus. Correlate with the clinical exam. No significant constipation or obstructing lesion is noted.   Status post cholecystectomy with biliary ductal dilatation. This is felt to be within normal limits for the patient's age and post cholecystectomy state.   Mild right basilar atelectasis.  EXAM: ABDOMEN - 2  VIEW IMPRESSION: Diffuse gaseous distension of the colon to the level of the left lower quadrant of the abdomen. Findings may reflect colonic ileus or distal colonic obstruction. If there is a clinical concern for distal colonic obstruction consider further investigation with CT of the abdomen pelvis with IV and oral contrast material   Large amount of retained stool noted in the right lower quadrant of the abdomen. It is unclear whether not this represents stool in the cecum or redundant sigmoid colon.  IV/PRN Meds: 0.9 % NaCl with KCl 20 mEq/ L infusion Rate: 100 mL/hr Freq: Continuous Route: IV        cefTRIAXone (ROCEPHIN) 1 g in sodium chloride 0.9 % 100 mL IVPB Dose: 1 g Freq: Every 24 hours Route: IV  heparin ADULT infusion 100 units/mL (25000 units/239mL) Rate: 8 mL/hr Dose: 800 Units/hr Freq: Continuous Route: IV        potassium chloride 10 mEq in 100 mL IVPB Dose: 10 mEq Freq: Every 1 hr x 3 Route: IV  acetaminophen (TYLENOL) tablet 325-650 mg Dose: 325-650 mg Freq: Every 6 hours PRN Route: PO PRN Reason: mild pain        hydrALAZINE (APRESOLINE) injection 5 mg Dose: 5 mg Freq: Every 6 hours PRN Route: IV PRN Comment: SBP>150 or DBP>100   LORazepam (ATIVAN) injection 0.5 mg Dose: 0.5 mg Freq: Every 6 hours PRN Route: IV PRN Reason: anxiety  magic mouthwash Dose: 5 mL Freq: 3 times daily PRN Route: PO PRN Reason: mouth pain        ondansetron (ZOFRAN) injection 4 mg Dose: 4 mg Freq: Every 6 hours PRN Route: IV  Problem List:   Ileus -Significant colon distention fused with gas but only mild stool burden.  Discussed with ED physician, reviewed CT abdomen together, ED  physician ordered rectal tube to disimpact colon. -As needed medications, avoid narcotic. -NPO and IVF   Acute metabolic encephalopathy -Likely from worsening of ileus and urinary retention. -Ileus treated as above -Ordered one-time straight cath, done bladder scan every shift and as  needed cath. -Usually she will consider hold benzos however daughter reported patient has responded to Ativan and nursing home.  Ordered low-dose of Ativan as needed.   Hypokalemia -IV replaced, recheck level tomorrow.   HTN -Hold p.o. BP meds until stable to take p.o.  As needed hydralazine for now   Chronic A. Fib with repeated CVAs -She has a high risk of recurrent stroke, according to patient's daughter, once patient developed stroke only after missing 1 day of Eliquis.  Doubt patient can take p.o., start heparin drip for now.  Discharge planning: Ongoing. Spoke with Dr. Maryfrances Bunnell and the goal will be to treat patient with IV antibiotics and supplement her potassium. If no improvement in 48 hrs discuss possibly transferring patient to inpatient hospice unit.   Family contact: Spoke with patient's daughter, [name] at bedside. Support provided.  IDT: Updated  Goals of care: Ongoing, desires to return home with hospice or transfer to inpatient hospice unit, if pt continues to decline, upon discharge from hospital.   Medication list and transfer summary placed on shadow chart.  Please do not hesitate to call with any hospice related questions.   Thank you,   Bobbie "Einar Gip, RN, BSN [facility] Liaison  [phone]

## 2021-01-11 NOTE — Consult Note (Signed)
Consultation Note Date: 01/11/2021   Patient Name: Suzanne Cantu  DOB: May 11, 1927  MRN: [id number]  Age / Sex: 87 y.o., female  PCP: P.A., Corky Downs, MD Referring Physician: Alberteen Sam, *  Reason for Consultation: Establishing goals of care  HPI/Patient Profile: Suzanne Cantu is a 87 y.o. female with medical history significant of vascular dementia, repeated CVA, chronic A. fib, IDDM, HTN, presented with worsening of constipation and after mentations.  Clinical Assessment and Goals of Care: Patient is resting in bed with daughter at bedside. Daughter is very attentive to patient, and patient does not speak except for at one point to say "go fly a kite". Daughter states patient is under hospice care and those notes have been reviewed.   She discusses her mother's previous CVA's and plans for continued Eliquis.   We discussed her diagnoses, prognosis, GOC, EOL wishes disposition and options.  Created space and opportunity for patient  to explore thoughts and feelings regarding current medical information.   A detailed discussion was had today regarding advanced directives.  Concepts specific to code status, artifical feeding and hydration, IV antibiotics and rehospitalization were discussed.  The difference between an aggressive medical intervention path and a comfort care path was discussed.  Values and goals of care important to patient and family were attempted to be elicited.  Discussed limitations of medical interventions to prolong quality of life in some situations and discussed the concept of human mortality.  Discussed different scenarios and what their wishes would be. She states she and her family need to continue conversations on care wishes moving forward. She is grateful for her mother's current care. She confirms DNR status. [facility] following to resume at D/C.        SUMMARY OF RECOMMENDATIONS   [facility] resuming care at D/C.   Prognosis:  Poor       Primary Diagnoses: Present on Admission: **None**   I have reviewed the medical record, interviewed the patient and family, and examined the patient. The following aspects are pertinent.  Past Medical History:  Diagnosis Date   Diabetes mellitus without complication (HCC)    Hypertension    Stroke [facility])    Social History   Socioeconomic History   Marital status: Widowed    Spouse name: Not on file   Number of children: Not on file   Years of education: Not on file   Highest education level: Not on file  Occupational History   Not on file  Tobacco Use   Smoking status: Never   Smokeless tobacco: Never  Substance and Sexual Activity   Alcohol use: Never   Drug use: Never   Sexual activity: Not on file  Other Topics Concern   Not on file  Social History Narrative   Not on file   Social Determinants of Health   Financial Resource Strain: Not on file  Food Insecurity: Not on file  Transportation Needs: Not on file  Physical Activity: Not on file  Stress: Not on file  Social  Connections: Not on file   Family History  Problem Relation Age of Onset   Diabetes Mellitus II Sister    Scheduled Meds:  Chlorhexidine Gluconate Cloth  6 each Topical Daily   insulin aspart  0-9 Units Subcutaneous TID WC   latanoprost  1 drop Both Eyes QHS   polyethylene glycol  17 g Oral Daily   Continuous Infusions:  0.9 % NaCl with KCl 20 mEq / L     cefTRIAXone (ROCEPHIN)  IV     heparin 800 Units/hr (01/10/21 2359)   potassium chloride     PRN Meds:.acetaminophen, hydrALAZINE, LORazepam, magic mouthwash, ondansetron (ZOFRAN) IV Medications Prior to Admission:  Prior to Admission medications   Medication Sig Start Date End Date Taking? Authorizing Provider  acetaminophen (TYLENOL) 325 MG tablet Take 1-2 tablets (325-650 mg total) by mouth every 6 (six) hours as needed for mild pain  (pain score 1-3 or temp > 100.5). 05/01/20  Yes Albertine Grates, MD  apixaban (ELIQUIS) 2.5 MG TABS tablet Take 2 tablets (5 mg total) by mouth 2 (two) times daily. 12/28/20  Yes Masoud, Renda Rolls, MD  bimatoprost (LUMIGAN) 0.01 % SOLN Place 1 drop into both eyes at bedtime.   Yes [provider]  lactulose (CHRONULAC) 10 GM/15ML solution Take 30 mLs by mouth every 2 (two) hours. until bowel movement. 01/07/21  Yes [provider]  LORazepam (ATIVAN) 0.5 MG tablet Take 0.5 mg by mouth every 4 (four) hours as needed. 12/31/20  Yes [provider]  metFORMIN (GLUCOPHAGE) 500 MG tablet TAKE 1 TABLET EVERY 12     HOURS 10/19/20  Yes Masoud, Renda Rolls, MD  senna-docusate (SENOKOT-S) 8.6-50 MG tablet Take 1 tablet by mouth at bedtime. 05/01/20  Yes Albertine Grates, MD  valsartan (DIOVAN) 40 MG tablet TAKE 1 TABLET BY MOUTH EVERY DAY 06/15/20  Yes Masoud, Renda Rolls, MD  ALPRAZolam (XANAX) 0.25 MG tablet Take 1 tablet (0.25 mg total) by mouth daily. 07/13/20   Corky Downs, MD  amLODipine (NORVASC) 5 MG tablet Take 1 tablet (5 mg total) by mouth daily. Patient not taking: Reported on 01/10/2021 05/02/20   Albertine Grates, MD  Cholecalciferol (VITAMIN D-3 PO) Take 1,000 mg by mouth daily after supper.  Patient not taking: Reported on 01/10/2021    [provider]  feeding supplement, GLUCERNA SHAKE, (GLUCERNA SHAKE) LIQD Take 237 mLs by mouth 3 (three) times daily between meals. 05/01/20   Albertine Grates, MD  magic mouthwash SOLN Take 5 mLs by mouth 3 (three) times daily as needed for mouth pain. Patient not taking: Reported on 01/10/2021 05/01/20   Albertine Grates, MD  Multiple Vitamins-Minerals (PRESERVISION AREDS 2) CAPS Take 1 capsule by mouth 2 (two) times daily. Patient not taking: Reported on 01/10/2021    [provider]   Allergies  Allergen Reactions   Valproic Acid And Related Other (See Comments)    Low blood pressure, weakness.   Metaxalone Other (See Comments)    "It made me not feel right"    Peanut-Containing Drug Products Other (See Comments)    Raises blood sugar   Review of Systems  Unable to perform ROS  Physical Exam Pulmonary:     Effort: Pulmonary effort is normal.  Neurological:     Mental Status: She is alert.    Vital Signs: BP (!) 159/63 (BP Location: Right Arm)   Pulse 75   Temp 98.5 F (36.9 C) (Oral)   Resp 16   Ht 5\' 5"  (1.651 m)   Wt  49.5 kg   SpO2 100%   BMI 18.16 kg/m  Pain Scale: Faces POSS *See Group Information*: 1-Acceptable,Awake and alert     SpO2: SpO2: 100 % O2 Device:SpO2: 100 % O2 Flow Rate: .   IO: Intake/output summary:  Intake/Output Summary (Last 24 hours) at 01/11/2021 1552 Last data filed at 01/11/2021 1510 Gross per 24 hour  Intake 2574.7 ml  Output 3100 ml  Net -525.3 ml    LBM:   Baseline Weight: Weight: 49.5 kg Most recent weight: Weight: 49.5 kg       Time In: 3:20 Time Out: 4:00 Time Total: 40 min Greater than 50%  of this time was spent counseling and coordinating care related to the above assessment and plan.  Signed by: Morton Stall, NP   Please contact Palliative Medicine Team phone at [phone] for questions and concerns.  For individual provider: See Loretha Stapler

## 2021-01-11 NOTE — Progress Notes (Signed)
Spoke with the daughter [name] on the phone, she stated that the patient lives at home with her fanily and 3 paid caregivers round the clock They have [facility] They have all the DME at home that they need They will not decide to do any type of rehab and will continue with Hospice I provided my contact information and she will contact me with any needs

## 2021-01-12 DIAGNOSIS — Z7189 Other specified counseling: Secondary | ICD-10-CM | POA: Diagnosis not present

## 2021-01-12 DIAGNOSIS — K567 Ileus, unspecified: Secondary | ICD-10-CM | POA: Diagnosis not present

## 2021-01-12 LAB — BASIC METABOLIC PANEL
Anion gap: 4 — ABNORMAL LOW (ref 5–15)
BUN: 6 mg/dL — ABNORMAL LOW (ref 8–23)
CO2: 27 mmol/L (ref 22–32)
Calcium: 8.8 mg/dL — ABNORMAL LOW (ref 8.9–10.3)
Chloride: 107 mmol/L (ref 98–111)
Creatinine, Ser: 0.53 mg/dL (ref 0.44–1.00)
GFR, Estimated: >60 mL/min (ref >60)
Glucose, Bld: 148 mg/dL — ABNORMAL HIGH (ref 70–99)
Potassium: 4.2 mmol/L (ref 3.5–5.1)
Sodium: 138 mmol/L (ref 135–145)

## 2021-01-12 LAB — GLUCOSE, CAPILLARY
Glucose-Capillary: 159 mg/dL — ABNORMAL HIGH (ref 70–99)
Glucose-Capillary: 94 mg/dL (ref 70–99)
Glucose-Capillary: 158 mg/dL — ABNORMAL HIGH (ref 70–99)
Glucose-Capillary: 112 mg/dL — ABNORMAL HIGH (ref 70–99)

## 2021-01-12 LAB — MAGNESIUM: Magnesium: 1.7 mg/dL (ref 1.7–2.4)

## 2021-01-12 LAB — HEPARIN LEVEL (UNFRACTIONATED): Heparin Unfractionated: >1.1 IU/mL — ABNORMAL HIGH (ref 0.30–0.70)

## 2021-01-12 LAB — CBC
HCT: 30.6 % — ABNORMAL LOW (ref 36.0–46.0)
Hemoglobin: 10.5 g/dL — ABNORMAL LOW (ref 12.0–15.0)
MCH: 31.5 pg (ref 26.0–34.0)
MCHC: 34.3 g/dL (ref 30.0–36.0)
MCV: 91.9 fL (ref 80.0–100.0)
Platelets: 193 10*3/uL (ref 150–400)
RBC: 3.33 MIL/uL — ABNORMAL LOW (ref 3.87–5.11)
RDW: 13.2 % (ref 11.5–15.5)
WBC: 7.8 10*3/uL (ref 4.0–10.5)
nRBC: 0 % (ref 0.0–0.2)

## 2021-01-12 LAB — APTT: aPTT: 93 seconds — ABNORMAL HIGH (ref 24–36)

## 2021-01-12 MED ORDER — MAGNESIUM SULFATE 2 GM/50ML IV SOLN
2.0000 g | Freq: Once | INTRAVENOUS | Status: AC
  Administered 2021-01-12: 2 g via INTRAVENOUS
  Filled 2021-01-12: qty 50

## 2021-01-12 MED ORDER — ALPRAZOLAM 0.25 MG PO TABS
0.2500 mg | ORAL_TABLET | Freq: Every day | ORAL | Status: DC
  Administered 2021-01-12 – 2021-01-14 (×3): 0.25 mg via ORAL
  Filled 2021-01-12 (×3): qty 1

## 2021-01-12 MED ORDER — MORPHINE SULFATE (PF) 2 MG/ML IV SOLN: 0.2500 mg | INTRAVENOUS | Status: DC | PRN

## 2021-01-12 MED ORDER — MORPHINE SULFATE (PF) 2 MG/ML IV SOLN
0.5000 mg | INTRAVENOUS | Status: DC | PRN
  Administered 2021-01-12: 0.5 mg via INTRAVENOUS
  Filled 2021-01-12: qty 1

## 2021-01-12 MED ORDER — POTASSIUM CHLORIDE 10 MEQ/100ML IV SOLN
10.0000 meq | Freq: Once | INTRAVENOUS | Status: AC
  Administered 2021-01-12: 10 meq via INTRAVENOUS
  Filled 2021-01-12: qty 100

## 2021-01-12 NOTE — Progress Notes (Addendum)
Daily Progress Note   Patient Name: Suzanne Cantu       Date: 01/12/2021 DOB: [birth year]  Age: 87 y.o. MRN#: [id number] Attending Physician: Kathlen Mody, MD Primary Care Physician: Corky Downs, MD Admit Date: 01/10/2021  Reason for Consultation/Follow-up: Establishing goals of care  Subjective: Daughter is at bedside with patient. She is feeding her a liquid diet. Patient ate about half of an [place] ice with no complaint or vomiting- daughter states she thought she smelled flatus earlier. Daughter discusses that her mother wants to walk. She also would like to resume Eliquis and stop Heparin infusion as soon as it is safe to do so. Patient to be followed by hospice upon D/C.   Length of Stay: 2  Current Medications: Scheduled Meds:   ALPRAZolam  0.25 mg Oral Daily   Chlorhexidine Gluconate Cloth  6 each Topical Daily   insulin aspart  0-9 Units Subcutaneous TID WC   latanoprost  1 drop Both Eyes QHS   polyethylene glycol  17 g Oral Daily    Continuous Infusions:  0.9 % NaCl with KCl 20 mEq / L 100 mL/hr at 01/12/21 0643   cefTRIAXone (ROCEPHIN)  IV 1 g (01/11/21 1626)   heparin 800 Units/hr (01/12/21 0720)   magnesium sulfate bolus IVPB      PRN Meds: acetaminophen, acetaminophen, hydrALAZINE, LORazepam, magic mouthwash, morphine injection, ondansetron (ZOFRAN) IV  Physical Exam Pulmonary:     Effort: Pulmonary effort is normal.  Neurological:     Mental Status: She is alert.            Vital Signs: BP 139/67 (BP Location: Right Arm)   Pulse 79   Temp 98.2 F (36.8 C)   Resp 14   Ht 5\' 5"  (1.651 m)   Wt 49.5 kg   SpO2 98%   BMI 18.16 kg/m  SpO2: SpO2: 98 % O2 Device: O2 Device: Room Air O2 Flow Rate:    Intake/output summary:  Intake/Output Summary  (Last 24 hours) at 01/12/2021 1306 Last data filed at 01/12/2021 0650 Gross per 24 hour  Intake 2133.3 ml  Output 1850 ml  Net 283.3 ml   LBM: Last BM Date:  (UTA) Baseline Weight: Weight: 49.5 kg Most recent weight: Weight: 49.5 kg        Patient Active Problem List  Diagnosis Date Noted   Ileus (HCC) 01/10/2021   TIA (transient ischemic attack) 08/16/2020   Paroxysmal atrial fibrillation (HCC) 08/16/2020   Fracture (healed) treatment follow-up 05/12/2020   Protein-calorie malnutrition, severe 04/23/2020   Fall 04/21/2020   Closed right hip fracture (HCC) 04/21/2020   Atrial fibrillation, chronic (HCC) 04/21/2020   Seizure (HCC) 12/15/2019   History of CVA (cerebrovascular accident) 11/07/2019   CVA (cerebral vascular accident) (HCC) 10/27/2019   Encephalopathy acute 07/31/2019   Hypertensive urgency 07/31/2019   Essential hypertension 07/31/2019   Controlled type 2 diabetes mellitus without complication, without long-term current use of insulin (HCC) 07/31/2019   Chronic back pain 07/31/2019   Hypertensive emergency 07/31/2019   Acute encephalopathy 07/31/2019    Palliative Care Assessment & Plan   Recommendations/Plan: Recommend D/Cing heparin and restarting Eliquis as soon as it is safe. Recommend patient to get out of bed and walk with assistance if she wishes too. Hospice to follow at D/C.    Code Status:    Code Status Orders  (From admission, onward)           Start     Ordered   01/10/21 2049  Do not attempt resuscitation (DNR)  Continuous       Question Answer Comment  In the event of cardiac or respiratory ARREST Do not call a "code blue"   In the event of cardiac or respiratory ARREST Do not perform Intubation, CPR, defibrillation or ACLS   In the event of cardiac or respiratory ARREST Use medication by any route, position, wound care, and other measures to relive pain and suffering. May use oxygen, suction and manual treatment of airway obstruction  as needed for comfort.      01/10/21 2049           Code Status History     Date Active Date Inactive Code Status Order ID Comments User Context   04/23/2020 1219 05/01/2020 2318 DNR [id number]  Tresa Moore, MD Inpatient   04/21/2020 1114 04/23/2020 1219 Full Code [id number]  Lorretta Harp, MD ED   10/27/2019 1822 10/29/2019 1936 Full Code [id number]  Emeline General, MD ED   07/31/2019 2144 08/02/2019 2024 Full Code [id number]  Cristescu, Victorino Sparrow, MD ED       Care plan was discussed with RN  Thank you for allowing the Palliative Medicine Team to assist in the care of this patient.       Total Time 25 min Prolonged Time Billed  no       Greater than 50%  of this time was spent counseling and coordinating care related to the above assessment and plan.  Morton Stall, NP  Please contact Palliative Medicine Team phone at [phone] for questions and concerns.

## 2021-01-12 NOTE — Evaluation (Signed)
Occupational Therapy Evaluation Patient Details Name: Suzanne Cantu MRN: [id number] DOB: Dec 20, 1926 Today's Date: 01/12/2021    History of Present Illness Suzanne Cantu is a 87 y.o. F with vascular dementia, A. Fib and recurrent CVAs now on Hospice since last stroke in May, DM, and HTN who is family brought her in after constipation for 1 week.   Clinical Impression   Suzanne Cantu was seen for OT evaluation this date, known to OT from prior hospital admission. PTA pt required assist for mobility and ADLs, daughter reports limited household mobility with RW. Pt lives with family/PCA available 24/7. Pt presents to acute OT demonstrating near baseline mobiltiy and ADLs. Pt currently requires MAX A for LBD seated EOB. SETUP grooming seated EOB. MIN A + RW for ADL t/f - assist for lift off and RW mgmt. Pt would benefit from skilled OT to address noted impairments and functional limitations (see below for any additional details) in order to maximize safety and independence while minimizing falls risk and caregiver burden. Upon hospital discharge, recommend 24/7 Supervision.     Follow Up Recommendations  No OT follow up;Supervision/Assistance - 24 hour    Equipment Recommendations  None recommended by OT    Recommendations for Other Services       Precautions / Restrictions Precautions Precautions: Fall;Other (comment) (delirium pcns) Restrictions Weight Bearing Restrictions: No      Mobility Bed Mobility Overal bed mobility: Needs Assistance Bed Mobility: Supine to Sit;Sit to Supine     Supine to sit: Min assist;HOB elevated Sit to supine: Mod assist        Transfers Overall transfer level: Needs assistance Equipment used: Rolling walker (2 wheeled) Transfers: Sit to/from Stand Sit to Stand: Min assist         General transfer comment: tolerated ~15 ft mobility    Balance Overall balance assessment: Needs assistance Sitting-balance support: No upper extremity  supported;Feet supported Sitting balance-Leahy Scale: Fair     Standing balance support: Bilateral upper extremity supported Standing balance-Leahy Scale: Poor                             ADL either performed or assessed with clinical judgement   ADL Overall ADL's : Needs assistance/impaired                                       General ADL Comments: MAX A for LBD seated EOB. SETUP grooming seated EOB. MIN A + RW for ADL t/f - assist for lift off and RW mgmt      Pertinent Vitals/Pain Pain Assessment: No/denies pain     Hand Dominance Right   Extremity/Trunk Assessment Upper Extremity Assessment Upper Extremity Assessment: Generalized weakness   Lower Extremity Assessment Lower Extremity Assessment: Generalized weakness       Communication Communication Communication: Expressive difficulties   Cognition Arousal/Alertness: Awake/alert Behavior During Therapy: Flat affect Overall Cognitive Status: History of cognitive impairments - at baseline                                        Exercises Exercises: Other exercises Other Exercises Other Exercises: Pt educated re: OT role, DME recs, d/c recs, falls prevention, ECS Other Exercises: LBD, grooming, sup<>sit, sit<>stand, sitting/standing balance/tolerance, ~15 ft mobility  Shoulder Instructions      Home Living Family/patient expects to be discharged to:: Private residence Living Arrangements: Children Available Help at Discharge: Family;Personal care attendant;Available 24 hours/day Type of Home: House Home Access: Ramped entrance     Home Layout: One level     Bathroom Shower/Tub: Walk-in shower         Home Equipment: Environmental consultant - 2 wheels;Shower seat;Grab bars - toilet;Grab bars - tub/shower;Hand held shower head;Transport chair   Additional Comments: Grab bars on walls in bathroom       Prior Functioning/Environment Level of Independence: Needs assistance   Gait / Transfers Assistance Needed: assist from family and PCA for lift off/RW mgmt ADL's / Homemaking Assistance Needed: assist from family            OT Problem List: Decreased strength;Decreased range of motion;Decreased activity tolerance;Impaired balance (sitting and/or standing);Decreased safety awareness      OT Treatment/Interventions: Self-care/ADL training;Therapeutic exercise;Energy conservation;DME and/or AE instruction;Therapeutic activities;Patient/family education;Balance training    OT Goals(Current goals can be found in the care plan section) Acute Rehab OT Goals Patient Stated Goal: To go home with hospice OT Goal Formulation: With patient/family Time For Goal Achievement: 01/26/21 Potential to Achieve Goals: Good ADL Goals Pt Will Perform Grooming: standing;with min guard assist Pt Will Perform Upper Body Bathing: with set-up;with supervision;sitting Pt Will Transfer to Toilet: with supervision;ambulating;regular height toilet (c LRAD PRN)  OT Frequency: Min 2X/week    AM-PAC OT "6 Clicks" Daily Activity     Outcome Measure Help from another person eating meals?: A Lot Help from another person taking care of personal grooming?: A Lot Help from another person toileting, which includes using toliet, bedpan, or urinal?: A Lot Help from another person bathing (including washing, rinsing, drying)?: A Lot Help from another person to put on and taking off regular upper body clothing?: A Lot Help from another person to put on and taking off regular lower body clothing?: A Lot 6 Click Score: 12   End of Session Equipment Utilized During Treatment: Rolling walker;Gait belt Nurse Communication: Mobility status  Activity Tolerance: Patient tolerated treatment well Patient left: in bed;with call bell/phone within reach;with bed alarm set;with family/visitor present  OT Visit Diagnosis: Other abnormalities of gait and mobility (R26.89)                Time:  0263-7858 OT Time Calculation (min): 27 min Charges:  OT General Charges $OT Visit: 1 Visit OT Evaluation $OT Eval Low Complexity: 1 Low OT Treatments $Self Care/Home Management : 23-37 mins  Kathie Dike, M.S. OTR/L  01/12/21, 4:27 PM  ascom [phone]

## 2021-01-12 NOTE — Progress Notes (Signed)
[facility] [employer] [facility]) Hospital Liaison RN note  Suzanne Cantu is a current hospice patient with a terminal diagnosis of hemiplegia and hemiparesis following cerebral infarction affecting right dominant side, hemiplegia and hemiparesis following cerebral infarction affecting left non-dominant side and other symptoms and signs involving cognitive functions following cerebral infarction. Patient had been experiencing vomiting for a couple of days prior to arriving at hospital. She had increasing agitation and decreased appetite. Distended abdomen. Transported to [facility] for evaluation. Patient was admitted on 7.11.22 with diagnosis of ileus. Per Dr. Dan Humphreys with [facility], this is a related hospital admission.  Visited with patient and daughter Suzanne Cantu at bedside. Patient is experiencing some agitation and Suzanne Cantu relates that patient has been medicated without significant improvement. Suzanne Cantu expresses that her mother is wanting to get out of the bed. Discussed with bedside RN, who will discuss with MD.   Micah Flesher back a bit later this afternoon. Pt still agitated and has not been out of the bed yet, though PT and OT are scheduled to come and assist with patient getting up. Suzanne Cantu relates that patient has eaten a few bites of clear liquids this afternoon and thus far has tolerated. Suzanne Cantu states she feels like patient may be passing some gas. Bedside RN and Suzanne Cantu turned patient and she has had some stool smears around the [facility] and is draining some liquid through the rectal tube.  Patient remains appropriate for inpatient status for treatment of ileus requiring IVF, IV antibiotics, potassium and magnesium replacement and IV heparin. Pt is not able to take PO Eliquis at this time for prevention or CVA.  VS: T 98.2, BP 139/67, HR 79, RR 14, O2 sat at 98% on room air.  I/O: 2133/1850  Abnormal Labs: Glucose: 148 (H) BUN: 6 (L) Creatinine: 0.53 Calcium: 8.8 (L) Anion gap: 4 (L) RBC:  3.33 (L) Hemoglobin: 10.5 (L) HCT: 30.6 (L) APTT: 93 (H)  Urine culture pending.  No new diagnostics.  IV/PRN meds: 0.9 % NaCl with KCl 20 mEq/ L infusion Rate: 100 mL/hr  cefTRIAXone (ROCEPHIN) 1 g in sodium chloride 0.9 % 100 mL IVPB Dose: 1 g Freq: Every 24 hours   heparin ADULT infusion 100 units/mL (25000 units/255mL) Rate: 8 mL/hr Dose: 800 Units/hr  magnesium sulfate IVPB 2 g 50 mL Dose: 2 g Freq:  Once  acetaminophen (TYLENOL) suppository 650 mg Dose: 650 mg X 2 (last 24 hours)  morphine 2 MG/ML injection 0.5 mg Dose: 0.5 mg X 1 (last 24 hours)  ondansetron (ZOFRAN) injection 4 mg Dose: 4 mg X 2 (last 24 hours  Problem List: Colonic pseudo-obstruction Unclear cause.  Possibly low K.  Possibly occult infection.  More likely this is spontaneous given the natural end of life process.   I recommend a time-limited 48 hour trial of fluids, antibiotics and electrolyte supplementation.  A simple rectal tube was placed in the ER, which is atypical for [facility]'s treatment, not likely to harm, nor help.   Given her age and frailty, she is certainly not a candidate for neostigmine, less so for colonoscopic decompression.  If patient fails conservative management, as I suspect she will, I recommend residential hospice.   -IV K -IV fluids -NPO -Start Rocephin, empirically, as this may be UTI - Obtain urine culture -Given surgery is not an option, I will defer serial x-rays -It would not be unreasonable to trial a dulcolax suppository, if this can be placed around her rectal tube  Acute metabolic encephalopathy ruled out Patient's mentation is at her recent baseline, very limited  Hypokalemia -Include potassium and IV fluids  Hypertension Pressure elevated - Hold valsartan while n.p.o.  Chronic atrial fibrillation Cerebrovascular disease, secondary prevention Anticoagulation is continued ambulates requested - Hold Eliquis while n.p.o. - Continue  heparin drip   Diabetes Glucose controlled - Sliding scale corrections, low-dose  Severe protein calorie malnutrition As evidenced by severe loss of subcutaneous muscle mass and fat, reported eating less than 50% of meals for the last 2 months, BMI 18, and advanced dementia.  Discharge Planning: Ongoing. Hopeful for improvement with conservative management. If no improvement, consider possibly transferring patient to inpatient hospice unit for end of life care.  Family: As above. Daughter feels as if there is improvement and desires to restart Eliquis when safe to do so and return patient to home with hospice services. Support provided.  IDG: Updated  Goals of Care: Ongoing; as above.  Please call with any hospice related questions or concerns.  Thank you. Haynes Bast, BSN, RN [facility] Liaison [phone]

## 2021-01-12 NOTE — Consult Note (Addendum)
ANTICOAGULATION CONSULT NOTE -   Pharmacy Consult for heparin infusion Indication: atrial fibrillation and pulmonary embolus  Allergies  Allergen Reactions   Valproic Acid And Related Other (See Comments)    Low blood pressure, weakness.   Metaxalone Other (See Comments)    "It made me not feel right"   Peanut-Containing Drug Products Other (See Comments)    Raises blood sugar    Patient Measurements: Height: 5\' 5"  (165.1 cm) Weight: 49.5 kg (109 lb 1.6 oz) IBW/kg (Calculated) : 57 Heparin Dosing Weight: 49.5 kg   Vital Signs: Temp: 97.8 F (36.6 C) (07/13 0529) BP: 171/68 (07/13 0529) Pulse Rate: 85 (07/13 0529)  Labs: Recent Labs    01/10/21 1326 01/11/21 0759 01/11/21 0841 01/12/21 0503  HGB 12.4  --  12.3 10.5*  HCT 36.8  --  35.6* 30.6*  PLT 339  --  215 193  APTT  --  82*  --  93*  HEPARINUNFRC  --  >1.10*  --   --   CREATININE 0.65 0.49  --  0.53     Estimated Creatinine Clearance: 35.8 mL/min (by C-G formula based on SCr of 0.53 mg/dL).   Medical History: Past Medical History:  Diagnosis Date   Diabetes mellitus without complication (HCC)    Hypertension    Stroke (HCC)     Medications:  Apixaban 5 mg BID PTA: Last dose 7/11 AM  Assessment: 87 y.o female with hx of chronic afib anticoagulated PTA on apixaban. Found to have ileus. Pharmacy has been consulted to transition from oral apixaban to IV heparin while patient NPO.  7/12 @0759  aPTT=82, HL >1.10   cont at 800 units/hr 7/12 @1600 - pt refused labs 7/13 @0503  aPTT=93, HL >1.10 Cont. At 800 units/hr  Goal of Therapy:  Heparin level 0.3-0.7 units/ml aPTT 66-102 seconds Monitor platelets by anticoagulation protocol: Yes   Plan:   7/13 @0503  aPTT=93, therapeutic  HL still >1.10 (not correlating) Continue heparin infusion at 800 units/hr  Check f/u aPTT with am labs. Anticipate transition back to apixaban when pt able to take PO.  Monitor by aPTT until HL begins to correlate then change  to HL monitoring CBC daily   , PharmD Clinical Pharmacist   01/12/2021,7:27 AM

## 2021-01-12 NOTE — Progress Notes (Signed)
PROGRESS NOTE    Suzanne Cantu  MBT:[id number] DOB: Aug 08, 1926 DOA: 01/10/2021 PCP: Corky Downs, MD    Chief Complaint  Patient presents with   Constipation    Brief Narrative:  Suzanne Cantu is a 87 y.o. F with vascular dementia, A. Fib and recurrent CVAs now on Hospice since last stroke in May, DM, and HTN who is family brought her in after constipation for 1 week.  In the ER x-ray showed colonic distention.  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  ER physician placed a rectal tube and the hospitalist service were asked to evaluate.   Assessment & Plan:   Active Problems:   Ileus (HCC)   Colonic pseudo obstruction:  - possibly due to bed bound and hypokalemia.  - rectal tube in place.  - passing flatus,  started on clears and if able to tolerate, will advance diet as tolerated.  - not a surgical candidate.     Acute metabolic encephalopathy ruled out.  Pt appears to be at baseline as per the daughter at bedside.    Hypokalemia  Replaced.    UTI:  On IV rocephin.   Chronic atrial fibrillation:  Rate controlled.  On IV heparin for anti coagulation.    Diet controlled DM Continue with SSI.    Severe protein calorie malnutrition:  Dietary on board.   Anxiety:  Resume xanax.    DVT prophylaxis: (Lovenox) Code Status: (DNR) Family Communication: Family at bedside.  Disposition:   Status is: Inpatient  Remains inpatient appropriate because:Unsafe d/c plan  Dispo: The patient is from: Home              Anticipated d/c is to: Home              Patient currently is not medically stable to d/c.   Difficult to place patient No       Consultants:  Palliative care.   Procedures: none.  Antimicrobials: none.   Subjective:   Objective: Vitals:   01/12/21 0529 01/12/21 0738 01/12/21 1127 01/12/21 1530  BP: (!) 171/68 (!) 167/66 139/67 140/62  Pulse: 85 86 79 79  Resp: 17 15 14 16   Temp: 97.8 F (36.6 C) 98.4 F (36.9 C) 98.2  F (36.8 C) 98.5 F (36.9 C)  TempSrc: Oral Oral    SpO2: 99%  98% 99%  Weight:      Height:        Intake/Output Summary (Last 24 hours) at 01/12/2021 1703 Last data filed at 01/12/2021 1521 Gross per 24 hour  Intake 2220.68 ml  Output 1350 ml  Net 870.68 ml   Filed Weights   01/10/21 2221  Weight: 49.5 kg    Examination:  General exam: Appears calm and comfortable  Respiratory system: Clear to auscultation. Respiratory effort normal. Cardiovascular system: S1 & S2 heard, RRR. No JVD, No pedal edema. Gastrointestinal system: Abdomen is nondistended, soft and nontender.  Normal bowel sounds heard. Central nervous system: Alert and confused.  Extremities: Symmetric 5 x 5 power. Skin: No rashes, lesions or ulcers Psychiatry: Mood & affect appropriate.     Data Reviewed: I have personally reviewed following labs and imaging studies  CBC: Recent Labs  Lab 01/10/21 1326 01/11/21 0841 01/12/21 0503  WBC 13.7* 7.6 7.8  HGB 12.4 12.3 10.5*  HCT 36.8 35.6* 30.6*  MCV 90.9 88.6 91.9  PLT 339 215 193    Basic Metabolic Panel: Recent Labs  Lab 01/10/21 1326 01/11/21 0759 01/12/21 0503  NA  134* 137 138  K 3.2* 3.3* 4.2  CL 98 104 107  CO2 29 25 27   GLUCOSE 272* 179* 148*  BUN 10 6* 6*  CREATININE 0.65 0.49 0.53  CALCIUM 9.5 8.9 8.8*  MG 2.0  --  1.7    GFR: Estimated Creatinine Clearance: 35.8 mL/min (by C-G formula based on SCr of 0.53 mg/dL).  Liver Function Tests: Recent Labs  Lab 01/10/21 1326 01/11/21 0759  AST 24 14*  ALT 12 11  ALKPHOS 85 71  BILITOT 1.0 0.9  PROT 7.2 5.7*  ALBUMIN 3.6 2.9*    CBG: Recent Labs  Lab 01/11/21 1553 01/11/21 1958 01/12/21 0737 01/12/21 1128 01/12/21 1638  GLUCAP 145* 121* 159* 94 158*     Recent Results (from the past 240 hour(s))  Resp Panel by RT-PCR (Flu A&B, Covid) Nasopharyngeal Swab     Status: None   Collection Time: 01/10/21 10:21 PM   Specimen: Nasopharyngeal Swab; Nasopharyngeal(NP) swabs  in vial transport medium  Result Value Ref Range Status   SARS Coronavirus 2 by RT PCR NEGATIVE NEGATIVE Final    Comment: (NOTE) SARS-CoV-2 target nucleic acids are NOT DETECTED.  The SARS-CoV-2 RNA is generally detectable in upper respiratory specimens during the acute phase of infection. The lowest concentration of SARS-CoV-2 viral copies this assay can detect is 138 copies/mL. A negative result does not preclude SARS-Cov-2 infection and should not be used as the sole basis for treatment or other patient management decisions. A negative result may occur with  improper specimen collection/handling, submission of specimen other than nasopharyngeal swab, presence of viral mutation(s) within the areas targeted by this assay, and inadequate number of viral copies(<138 copies/mL). A negative result must be combined with clinical observations, patient history, and epidemiological information. The expected result is Negative.  Fact Sheet for Patients:  03/13/21  Fact Sheet for Healthcare Providers:  BloggerCourse.com  This test is no t yet approved or cleared by the [employer].[place] FDA and  has been authorized for detection and/or diagnosis of SARS-CoV-2 by FDA under an Emergency Use Authorization (EUA). This EUA will remain  in effect (meaning this test can be used) for the duration of the COVID-19 declaration under Section 564(b)(1) of the Act, 21 U.S.C.section 360bbb-3(b)(1), unless the authorization is terminated  or revoked sooner.       Influenza A by PCR NEGATIVE NEGATIVE Final   Influenza B by PCR NEGATIVE NEGATIVE Final    Comment: (NOTE) The Xpert Xpress SARS-CoV-2/FLU/RSV plus assay is intended as an aid in the diagnosis of influenza from Nasopharyngeal swab specimens and should not be used as a sole basis for treatment. Nasal washings and aspirates are unacceptable for Xpert Xpress  SARS-CoV-2/FLU/RSV testing.  Fact Sheet for Patients: [place]  Fact Sheet for Healthcare Providers: BloggerCourse.com  This test is not yet approved or cleared by the [employer]ious[employer]ker.[place] FDA and has been authorized for detection and/or diagnosis of SARS-CoV-2 by FDA under an Emergency Use Authorization (EUA). This EUA will remain in effect (meaning this test can be used) for the duration of the COVID-19 declaration under Section 564(b)(1) of the Act, 21 U.S.C. section 360bbb-3(b)(1), unless the authorization is terminated or revoked.  Performed at [facility], [address] [place], [place]          Radiology Studies: CT ABDOMEN PELVIS W CONTRAST  Result Date: 01/10/2021 CLINICAL DATA:  Abdominal distension and history of constipation, initial encounter EXAM: CT ABDOMEN AND PELVIS WITH CONTRAST TECHNIQUE: Multidetector CT  imaging of the abdomen and pelvis was performed using the standard protocol following bolus administration of intravenous contrast. CONTRAST:  10mL OMNIPAQUE IOHEXOL 350 MG/ML SOLN COMPARISON:  Plain film from earlier in the day FINDINGS: Lower chest: Mild dependent atelectatic changes are noted in the right base. Coronary calcifications are seen. Hepatobiliary: Gallbladder has been surgically removed. Biliary ductal dilatation is noted consistent with the post cholecystectomy state. A small hypodensity is noted in the inferior aspect of the right lobe of the liver likely representing a small cyst. Pancreas: Unremarkable. No pancreatic ductal dilatation or surrounding inflammatory changes. Spleen: Normal in size without focal abnormality. Adrenals/Urinary Tract: Adrenal glands are within normal limits. Kidneys demonstrate a normal enhancement pattern bilaterally. Scattered small cysts are seen bilaterally. Prominent extrarenal pelvis on the right is noted. No true obstructive changes are seen.  The bladder is significantly distended. Stomach/Bowel: Gaseous distension of the colon is noted similar to that seen on prior plain film examination. Mild areas of wall thickening are noted in the mid transverse colon although likely related to decompression as opposed to focal abnormality. The appendix is not visualized. No inflammatory changes are noted to suggest appendicitis. The small bowel and stomach are within normal limits. Vascular/Lymphatic: Aortic atherosclerosis. No enlarged abdominal or pelvic lymph nodes. Reproductive: Status post hysterectomy. No adnexal masses. Other: No abdominal wall hernia or abnormality. No abdominopelvic ascites. Musculoskeletal: Postsurgical changes in the proximal right femur are seen. Degenerative changes of the lumbar spine are noted. IMPRESSION: Gaseous distension of the colon likely representing a diffuse ileus. Correlate with the clinical exam. No significant constipation or obstructing lesion is noted. Status post cholecystectomy with biliary ductal dilatation. This is felt to be within normal limits for the patient's age and post cholecystectomy state. Mild right basilar atelectasis. Electronically Signed   By: Alcide Clever M.D.   On: 01/10/2021 19:33        Scheduled Meds:  ALPRAZolam  0.25 mg Oral Daily   Chlorhexidine Gluconate Cloth  6 each Topical Daily   insulin aspart  0-9 Units Subcutaneous TID WC   latanoprost  1 drop Both Eyes QHS   polyethylene glycol  17 g Oral Daily   Continuous Infusions:  0.9 % NaCl with KCl 20 mEq / L 100 mL/hr at 01/12/21 1634   cefTRIAXone (ROCEPHIN)  IV 1 g (01/11/21 1626)   heparin 800 Units/hr (01/12/21 0720)     LOS: 2 days       Kathlen Mody, MD [facility]   To contact the attending provider between 7A-7P or the covering provider during after hours 7P-7A, please log into the web site [email or web address] and access using universal [place] password for that web site. If you do not have the  password, please call the hospital operator.  01/12/2021, 5:03 PM

## 2021-01-13 DIAGNOSIS — I10 Essential (primary) hypertension: Secondary | ICD-10-CM

## 2021-01-13 DIAGNOSIS — G9340 Encephalopathy, unspecified: Secondary | ICD-10-CM | POA: Diagnosis not present

## 2021-01-13 DIAGNOSIS — K5900 Constipation, unspecified: Secondary | ICD-10-CM

## 2021-01-13 LAB — CBC
HCT: 32.2 % — ABNORMAL LOW (ref 36.0–46.0)
Hemoglobin: 10.8 g/dL — ABNORMAL LOW (ref 12.0–15.0)
MCH: 30.7 pg (ref 26.0–34.0)
MCHC: 33.5 g/dL (ref 30.0–36.0)
MCV: 91.5 fL (ref 80.0–100.0)
Platelets: 213 10*3/uL (ref 150–400)
RBC: 3.52 MIL/uL — ABNORMAL LOW (ref 3.87–5.11)
RDW: 13.3 % (ref 11.5–15.5)
WBC: 5.8 10*3/uL (ref 4.0–10.5)
nRBC: 0 % (ref 0.0–0.2)

## 2021-01-13 LAB — HEPARIN LEVEL (UNFRACTIONATED): Heparin Unfractionated: 1.07 IU/mL — ABNORMAL HIGH (ref 0.30–0.70)

## 2021-01-13 LAB — URINE CULTURE: Culture: <10000 — AB

## 2021-01-13 LAB — APTT: aPTT: 68 seconds — ABNORMAL HIGH (ref 24–36)

## 2021-01-13 LAB — GLUCOSE, CAPILLARY
Glucose-Capillary: 169 mg/dL — ABNORMAL HIGH (ref 70–99)
Glucose-Capillary: 115 mg/dL — ABNORMAL HIGH (ref 70–99)

## 2021-01-13 MED ORDER — APIXABAN 2.5 MG PO TABS
2.5000 mg | ORAL_TABLET | Freq: Two times a day (BID) | ORAL | Status: DC
  Administered 2021-01-13 – 2021-01-14 (×3): 2.5 mg via ORAL
  Filled 2021-01-13 (×2): qty 1

## 2021-01-13 MED ORDER — SENNOSIDES-DOCUSATE SODIUM 8.6-50 MG PO TABS
2.0000 | ORAL_TABLET | Freq: Two times a day (BID) | ORAL | Status: DC
  Administered 2021-01-13 – 2021-01-14 (×3): 2 via ORAL
  Filled 2021-01-13 (×2): qty 2

## 2021-01-13 MED ORDER — APIXABAN 2.5 MG PO TABS: 2.5000 mg | ORAL_TABLET | Freq: Two times a day (BID) | ORAL | Status: DC

## 2021-01-13 MED ORDER — BISACODYL 5 MG PO TBEC: 5.0000 mg | DELAYED_RELEASE_TABLET | Freq: Every day | ORAL | Status: DC | PRN

## 2021-01-13 NOTE — Consult Note (Signed)
ANTICOAGULATION CONSULT NOTE -   Pharmacy Consult for heparin infusion Indication: atrial fibrillation and pulmonary embolus  Allergies  Allergen Reactions   Valproic Acid And Related Other (See Comments)    Low blood pressure, weakness.   Metaxalone Other (See Comments)    "It made me not feel right"   Peanut-Containing Drug Products Other (See Comments)    Raises blood sugar    Patient Measurements: Height: 5\' 5"  (165.1 cm) Weight: 49.5 kg (109 lb 1.6 oz) IBW/kg (Calculated) : 57 Heparin Dosing Weight: 49.5 kg   Vital Signs: Temp: 98.8 F (37.1 C) (07/14 0314) BP: 147/53 (07/14 0314) Pulse Rate: 81 (07/14 0314)  Labs: Recent Labs    01/10/21 1326 01/11/21 0759 01/11/21 0841 01/12/21 0503 01/12/21 0845 01/13/21 0852  HGB 12.4  --  12.3 10.5*  --  10.8*  HCT 36.8  --  35.6* 30.6*  --  32.2*  PLT 339  --  215 193  --  213  APTT  --  82*  --  93*  --  68*  HEPARINUNFRC  --  >1.10*  --   --  >1.10* 1.07*  CREATININE 0.65 0.49  --  0.53  --   --      Estimated Creatinine Clearance: 35.8 mL/min (by C-G formula based on SCr of 0.53 mg/dL).   Medical History: Past Medical History:  Diagnosis Date   Diabetes mellitus without complication (HCC)    Hypertension    Stroke (HCC)     Medications:  Apixaban 5 mg BID PTA: Last dose 7/11 AM  Assessment: 87 y.o female with hx of chronic afib anticoagulated PTA on apixaban. Found to have ileus. Pharmacy has been consulted to transition from oral apixaban to IV heparin while patient NPO.  7/12 @0759  aPTT=82, HL >1.10   cont at 800 units/hr 7/12 @1600 - pt refused labs 7/13 @0503  aPTT=93, HL >1.10 Cont. At 800 units/hr 7/14 @ 0852 aPTT 79m HL 1.07  Goal of Therapy:  Heparin level 0.3-0.7 units/ml aPTT 66-102 seconds Monitor platelets by anticoagulation protocol: Yes   Plan:   7/14 @ 0852 aPTT 90m HL 1.07, therapeutic. (aPTT and HL not correlating) Continue heparin infusion at 800 units/hr  Check f/u aPTT with am  labs. Anticipate transition back to apixaban when pt able to take PO.  Monitor by aPTT until HL begins to correlate then change to HL monitoring CBC daily   8/14, PharmD, BCPS Clinical Pharmacist 01/13/2021 10:20 AM

## 2021-01-13 NOTE — Evaluation (Signed)
Physical Therapy Evaluation Patient Details Name: Suzanne Cantu MRN: [id number] DOB: 20-Nov-1926 Today's Date: 01/13/2021   History of Present Illness  Suzanne Cantu is a 87 y.o. F with vascular dementia, A. Fib and recurrent CVAs now on Hospice since last stroke in May, DM, and HTN who is family brought her in after constipation for 1 week.   Clinical Impression  Patient received in bed, she is agreeable to PT assessment. Patient requires min assist for supine to sit. Min assist for sit to stand and min assist to ambulate with RW 30 feet. Patient requires assistance to turn the walker around. Mod assist to return to bed sit to supine. Patient will continue to benefit from skilled PT while here to continue ambulation and maintain functional mobility.        Follow Up Recommendations Supervision/Assistance - 24 hour    Equipment Recommendations  None recommended by PT    Recommendations for Other Services       Precautions / Restrictions Precautions Precautions: Fall Restrictions Weight Bearing Restrictions: No      Mobility  Bed Mobility Overal bed mobility: Needs Assistance Bed Mobility: Supine to Sit;Sit to Supine     Supine to sit: Min assist;HOB elevated Sit to supine: Mod assist        Transfers Overall transfer level: Needs assistance Equipment used: Rolling walker (2 wheeled) Transfers: Sit to/from Stand Sit to Stand: Min assist            Ambulation/Gait Ambulation/Gait assistance: Min assist Gait Distance (Feet): 30 Feet Assistive device: Rolling walker (2 wheeled) Gait Pattern/deviations: Step-through pattern;Decreased step length - right;Decreased step length - left;Shuffle Gait velocity: decr   General Gait Details: Patient requires min assist for safety with ambulation and needed min assist to turn walker around  Stairs            Wheelchair Mobility    Modified Rankin (Stroke Patients Only)       Balance Overall balance  assessment: Needs assistance Sitting-balance support: Feet supported Sitting balance-Leahy Scale: Good     Standing balance support: Bilateral upper extremity supported;During functional activity Standing balance-Leahy Scale: Fair Standing balance comment: requires min assist and B UE suppot                             Pertinent Vitals/Pain Pain Assessment: No/denies pain    Home Living Family/patient expects to be discharged to:: Private residence Living Arrangements: Children Available Help at Discharge: Family;Personal care attendant;Available 24 hours/day Type of Home: House Home Access: Ramped entrance     Home Layout: One level Home Equipment: Walker - 2 wheels;Shower seat;Grab bars - toilet;Grab bars - tub/shower;Hand held shower head;Transport chair Additional Comments: Grab bars on walls in bathroom     Prior Function Level of Independence: Needs assistance   Gait / Transfers Assistance Needed: assist from family and PCA for lift off/RW mgmt  ADL's / Homemaking Assistance Needed: assist from family  Comments: RW for mobility and ADLs. assist for bathing and IADLs     Hand Dominance   Dominant Hand: Right    Extremity/Trunk Assessment   Upper Extremity Assessment Upper Extremity Assessment: Defer to OT evaluation    Lower Extremity Assessment Lower Extremity Assessment: Generalized weakness    Cervical / Trunk Assessment Cervical / Trunk Assessment: Kyphotic  Communication   Communication: Expressive difficulties  Cognition Arousal/Alertness: Awake/alert Behavior During Therapy: Flat affect Overall Cognitive Status: History of cognitive impairments -  at baseline                                        General Comments      Exercises     Assessment/Plan    PT Assessment Patient needs continued PT services  PT Problem List Decreased strength;Decreased mobility;Decreased activity tolerance;Decreased balance;Decreased  safety awareness;Decreased coordination       PT Treatment Interventions DME instruction;Therapeutic exercise;Gait training;Balance training;Functional mobility training;Therapeutic activities;Patient/family education    PT Goals (Current goals can be found in the Care Plan section)  Acute Rehab PT Goals Patient Stated Goal: To go home with hospice PT Goal Formulation: With patient/family Time For Goal Achievement: 01/20/21 Potential to Achieve Goals: Good    Frequency Min 2X/week   Barriers to discharge        Co-evaluation               AM-PAC PT "6 Clicks" Mobility  Outcome Measure Help needed turning from your back to your side while in a flat bed without using bedrails?: A Little Help needed moving from lying on your back to sitting on the side of a flat bed without using bedrails?: A Lot Help needed moving to and from a bed to a chair (including a wheelchair)?: A Little Help needed standing up from a chair using your arms (e.g., wheelchair or bedside chair)?: A Little Help needed to walk in hospital room?: A Lot Help needed climbing 3-5 steps with a railing? : A Lot 6 Click Score: 15    End of Session Equipment Utilized During Treatment: Gait belt Activity Tolerance: Patient tolerated treatment well;Patient limited by fatigue Patient left: in bed;with call bell/phone within reach;with family/visitor present Nurse Communication: Mobility status PT Visit Diagnosis: Difficulty in walking, not elsewhere classified (R26.2);Muscle weakness (generalized) (M62.81)    Time: 1130-1146 PT Time Calculation (min) (ACUTE ONLY): 16 min   Charges:   PT Evaluation $PT Eval Moderate Complexity: 1 Mod          Dominic Rhome, PT, GCS 01/13/21,2:14 PM

## 2021-01-13 NOTE — Progress Notes (Addendum)
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 Patient had been experiencing vomiting for a couple of days prior to arriving at hospital. She had increasing agitation and decreased appetite. Distended abdomen. Transported to [facility] for evaluation. Patient was admitted on 7.11.22 with diagnosis of ileus. Per Dr. Dan Humphreys with [facility], this is a related hospital admission.   Visited with patient at bedside. Resting in bed. Patient has ambulated with PT today. Experiencing agitation at times. Patient is tolerating clear liquid diet. Heparin discontinues. Patient is now taking Eliquis 2.5 mg BID PO. Spoke with daughter [name] by phone. Daughter states she desires for her mother to return home with hospice once discharged from hospital.   Patient remains appropriate for inpatient status for treatment of ileus requiring IVF, IV antibiotics, potassium and magnesium replacement.   VS: T 98.5, BP 144/72, HR 72, RR 16, O2 sat at 98% on room air.   I/O: 2747/1450   Abnormal Labs: Glucose: 169 (H) RBC: 3.52 (L) Hemoglobin: 10.8 (L) HCT: 32.2 (L) Heparin Unfractionated: 1.07 (H) APTT: 68 (H)  Urine culture  <10,000 COLONIES/mL INSIGNIFICANT GROWTH   No new diagnostics.   IV/PRN meds: 0.9 % NaCl with KCl 20 mEq/ L infusion Rate:  mL/hr   cefTRIAXone (ROCEPHIN) 1 g in sodium chloride 0.9 % 100 mL IVPB Dose: 1 g Freq: Every 24 hours   Problem List: Active Problems:   Encephalopathy acute   Essential hypertension   Controlled type 2 diabetes mellitus without complication, without long-term current use of insulin (HCC)   Seizure (HCC)    Protein-calorie malnutrition, severe   Paroxysmal atrial fibrillation (HCC)   Ileus (HCC)     Colonic pseudo obstruction: - possibly due to bed bound and hypokalemia. - rectal tube in place. - passing flatus,  started on clears and if able to tolerate, will advance diet as tolerated. Currently on full liquid diet. Still has rectal tube without any stool.  - not a surgical candidate.       Acute metabolic encephalopathy ruled out. Pt appears to be at baseline as per the daughter at bedside.  Pt is more calmer and not agitated. Continue with xanax.      Hypokalemia Replaced.     UTI: On IV rocephin. Urine cultures show insignificant growth.    Chronic atrial fibrillation: Rate controlled with BB and Transitioned to oral eliquis for anti coagulation.     Diet controlled DM Continue with SSI.    Discharge Planning: Ongoing. Patient tolerating clear liquid diet, ambulating with PT and hopes to return home with hospice upon discharge from hospital.   Family: Spoke with daughter on the phone. She desires her mother return home with hospice upon discharge from hospital. Support provided.   IDG: Updated   Goals of Care: Ongoing; as above.  Please do not hesitate to call with hospice related questions or concerns.   Thank you,   Bobbie "Einar Gip, RN, BSN [facility] liaison [phone]

## 2021-01-13 NOTE — Consult Note (Addendum)
ANTICOAGULATION CONSULT NOTE -   Pharmacy Consult for Apixaban  Indication: A Fib   Allergies  Allergen Reactions   Valproic Acid And Related Other (See Comments)    Low blood pressure, weakness.   Metaxalone Other (See Comments)    "It made me not feel right"   Peanut-Containing Drug Products Other (See Comments)    Raises blood sugar    Patient Measurements: Height: 5\' 5"  (165.1 cm) Weight: 49.5 kg (109 lb 1.6 oz) IBW/kg (Calculated) : 57 Heparin Dosing Weight: 49.5 kg   Vital Signs: Temp: 98.6 F (37 C) (07/14 1134) BP: 155/112 (07/14 1134) Pulse Rate: 88 (07/14 1134)  Labs: Recent Labs    01/11/21 0759 01/11/21 0841 01/11/21 0841 01/12/21 0503 01/12/21 0845 01/13/21 0852  HGB  --  12.3   < > 10.5*  --  10.8*  HCT  --  35.6*  --  30.6*  --  32.2*  PLT  --  215  --  193  --  213  APTT 82*  --   --  93*  --  68*  HEPARINUNFRC >1.10*  --   --   --  >1.10* 1.07*  CREATININE 0.49  --   --  0.53  --   --    < > = values in this interval not displayed.     Estimated Creatinine Clearance: 35.8 mL/min (by C-G formula based on SCr of 0.53 mg/dL).   Medical History: Past Medical History:  Diagnosis Date   Diabetes mellitus without complication (HCC)    Hypertension    Stroke (HCC)     Medications:  Apixaban 5 mg BID PTA: Last dose 7/11 AM  Assessment: 87 y.o female with hx of chronic afib anticoagulated PTA on apixaban. Found to have ileus. Pharmacy was consulted to transition from oral apixaban to IV heparin while patient NPO. Patient is now tolerating PO medications. Pharmacy is consulted for apixaban dosing and monitoring.   Goal of Therapy:  Monitor platelets by anticoagulation protocol: Yes   Plan:  Will start Apixaban 2.5mg  twice daily (Weight <60kg, Age >80). Will monitor CBC at least every 3 days while admitted per protocol  62, PharmD, BCPS Clinical Pharmacist 01/13/2021 2:02 PM

## 2021-01-13 NOTE — Progress Notes (Signed)
PROGRESS NOTE    Suzanne Cantu  KZS:[id number] DOB: Aug 09, 1926 DOA: 01/10/2021 PCP: Corky Downs, MD    Chief Complaint  Patient presents with   Constipation    Brief Narrative:  [name] is a 87 y.o. F with vascular dementia, A. Fib and recurrent CVAs now on Hospice since last stroke in May, DM, and HTN who is family brought her in after constipation for 1 week.  In the ER x-ray showed colonic distention.  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  ER physician placed a rectal tube and the hospitalist service were asked to evaluate.   Assessment & Plan:   Active Problems:   Encephalopathy acute   Essential hypertension   Controlled type 2 diabetes mellitus without complication, without long-term current use of insulin (HCC)   Seizure (HCC)   Protein-calorie malnutrition, severe   Paroxysmal atrial fibrillation (HCC)   Ileus (HCC)   Colonic pseudo obstruction:  - possibly due to bed bound and hypokalemia.  - rectal tube in place.  - passing flatus,  started on clears and if able to tolerate, will advance diet as tolerated. Currently on full liquid diet. Still has rectal tube without any stool.  - not a surgical candidate.     Acute metabolic encephalopathy ruled out.  Pt appears to be at baseline as per the daughter at bedside.  Pt is more calmer and not agitated.  Continue with xanax.    Hypokalemia  Replaced.    UTI:  On IV rocephin. Urine cultures show insignificant growth.   Chronic atrial fibrillation:  Rate controlled with BB and Transitioned to oral eliquis for anti coagulation.    Diet controlled DM Continue with SSI.  CBG (last 3)  Recent Labs    01/12/21 1638 01/12/21 2043 01/13/21 1259  GLUCAP 158* 112* 169*   No changes in meds.    Severe protein calorie malnutrition:  Dietary on board.   Anxiety:  Resume xanax.    Anemia of chronic disease Baseline hemoglobin between 9 to 11.  Currently around 10 .    DVT  prophylaxis: (Lovenox) Code Status: (DNR) Family Communication: Family at bedside.  Disposition:   Status is: Inpatient  Remains inpatient appropriate because:Unsafe d/c plan  Dispo: The patient is from: Home              Anticipated d/c is to: Home              Patient currently is not medically stable to d/c.   Difficult to place patient No       Consultants:  Palliative care.   Procedures: none.  Antimicrobials: none.   Subjective: Calmer, no abdominal pain. No vomiting.   Objective: Vitals:   01/12/21 1530 01/12/21 2055 01/13/21 0314 01/13/21 1134  BP: 140/62 (!) 154/61 (!) 147/53 (!) 155/112  Pulse: 79 79 81 88  Resp: 16 20 18 16   Temp: 98.5 F (36.9 C) 98 F (36.7 C) 98.8 F (37.1 C) 98.6 F (37 C)  TempSrc:  Oral    SpO2: 99% 98% 97% 99%  Weight:      Height:        Intake/Output Summary (Last 24 hours) at 01/13/2021 1339 Last data filed at 01/13/2021 1315 Gross per 24 hour  Intake 3127.73 ml  Output 2270 ml  Net 857.73 ml   Filed Weights   01/10/21 2221  Weight: 49.5 kg    Examination:  General exam: Elderly woman not in any kind of distress Respiratory  system: Clear to auscultation bilaterally no wheezing heard Cardiovascular system: S1-S2 heard, irregularly irregular no JVD no pedal edema  gastrointestinal system: Abdomen is soft mildly distended bowel sounds normal nontender Central nervous system: Alert, confused at baseline, able to move extremities Extremities: No pedal edema. Skin: No rashes seen psychiatry: Alert affect   Data Reviewed: I have personally reviewed following labs and imaging studies  CBC: Recent Labs  Lab 01/10/21 1326 01/11/21 0841 01/12/21 0503 01/13/21 0852  WBC 13.7* 7.6 7.8 5.8  HGB 12.4 12.3 10.5* 10.8*  HCT 36.8 35.6* 30.6* 32.2*  MCV 90.9 88.6 91.9 91.5  PLT 339 215 193 213    Basic Metabolic Panel: Recent Labs  Lab 01/10/21 1326 01/11/21 0759 01/12/21 0503  NA 134* 137 138  K 3.2* 3.3* 4.2   CL 98 104 107  CO2 29 25 27   GLUCOSE 272* 179* 148*  BUN 10 6* 6*  CREATININE 0.65 0.49 0.53  CALCIUM 9.5 8.9 8.8*  MG 2.0  --  1.7    GFR: Estimated Creatinine Clearance: 35.8 mL/min (by C-G formula based on SCr of 0.53 mg/dL).  Liver Function Tests: Recent Labs  Lab 01/10/21 1326 01/11/21 0759  AST 24 14*  ALT 12 11  ALKPHOS 85 71  BILITOT 1.0 0.9  PROT 7.2 5.7*  ALBUMIN 3.6 2.9*    CBG: Recent Labs  Lab 01/12/21 0737 01/12/21 1128 01/12/21 1638 01/12/21 2043 01/13/21 1259  GLUCAP 159* 94 158* 112* 169*     Recent Results (from the past 240 hour(s))  Resp Panel by RT-PCR (Flu A&B, Covid) Nasopharyngeal Swab     Status: None   Collection Time: 01/10/21 10:21 PM   Specimen: Nasopharyngeal Swab; Nasopharyngeal(NP) swabs in vial transport medium  Result Value Ref Range Status   SARS Coronavirus 2 by RT PCR NEGATIVE NEGATIVE Final    Comment: (NOTE) SARS-CoV-2 target nucleic acids are NOT DETECTED.  The SARS-CoV-2 RNA is generally detectable in upper respiratory specimens during the acute phase of infection. The lowest concentration of SARS-CoV-2 viral copies this assay can detect is 138 copies/mL. A negative result does not preclude SARS-Cov-2 infection and should not be used as the sole basis for treatment or other patient management decisions. A negative result may occur with  improper specimen collection/handling, submission of specimen other than nasopharyngeal swab, presence of viral mutation(s) within the areas targeted by this assay, and inadequate number of viral copies(<138 copies/mL). A negative result must be combined with clinical observations, patient history, and epidemiological information. The expected result is Negative.  Fact Sheet for Patients:  03/13/21  Fact Sheet for Healthcare Providers:  BloggerCourse.com  This test is no t yet approved or cleared by the [place] FDA  and  has been authorized for detection and/or diagnosis of SARS-CoV-2 by FDA under an Emergency Use Authorization (EUA). This EUA will remain  in effect (meaning this test can be used) for the duration of the COVID-19 declaration under Section 564(b)(1) of the Act, 21 U.S.C.section 360bbb-3(b)(1), unless the authorization is terminated  or revoked sooner.       Influenza A by PCR NEGATIVE NEGATIVE Final   Influenza B by PCR NEGATIVE NEGATIVE Final    Comment: (NOTE) The Xpert Xpress SARS-CoV-2/FLU/RSV plus assay is intended as an aid in the diagnosis of influenza from Nasopharyngeal swab specimens and should not be used as a sole basis for treatment. Nasal washings and aspirates are unacceptable for Xpert Xpress SARS-CoV-2/FLU/RSV testing.  Fact Sheet for Patients: [place]  Fact Sheet for Healthcare Providers: SeriousBroker.it  This test is not yet approved or cleared by the [place] FDA and has been authorized for detection and/or diagnosis of SARS-CoV-2 by FDA under an Emergency Use Authorization (EUA). This EUA will remain in effect (meaning this test can be used) for the duration of the COVID-19 declaration under Section 564(b)(1) of the Act, 21 U.S.C. section 360bbb-3(b)(1), unless the authorization is terminated or revoked.  Performed at [facility], [address], [place], [place] [address]   Urine Culture     Status: Abnormal   Collection Time: 01/11/21  3:58 PM   Specimen: Urine, Random  Result Value Ref Range Status   Specimen Description   Final    URINE, RANDOM Performed at [facility], [address] [place], [place] [address]    Special Requests   Final    NONE Performed at [facility], [address] [place], [place] [address]    Culture (A)  Final    <10,000 COLONIES/mL INSIGNIFICANT GROWTH Performed at [facility], [address]  [place], [place] [address]    Report Status 01/13/2021 FINAL  Final         Radiology Studies: No results found.      Scheduled Meds:  ALPRAZolam  0.25 mg Oral Daily   Chlorhexidine Gluconate Cloth  6 each Topical Daily   insulin aspart  0-9 Units Subcutaneous TID WC   latanoprost  1 drop Both Eyes QHS   polyethylene glycol  17 g Oral Daily   Continuous Infusions:  0.9 % NaCl with KCl 20 mEq / L 100 mL/hr at 01/13/21 1315   cefTRIAXone (ROCEPHIN)  IV 1 g (01/12/21 1814)   heparin 800 Units/hr (01/12/21 0720)     LOS: 3 days       Kathlen Mody, MD [facility]   To contact the attending provider between 7A-7P or the covering provider during after hours 7P-7A, please log into the web site [email or web address] and access using universal [place] password for that web site. If you do not have the password, please call the hospital operator.  01/13/2021, 1:39 PM

## 2021-01-14 DIAGNOSIS — E119 Type 2 diabetes mellitus without complications: Secondary | ICD-10-CM

## 2021-01-14 DIAGNOSIS — K5900 Constipation, unspecified: Secondary | ICD-10-CM | POA: Diagnosis not present

## 2021-01-14 DIAGNOSIS — I10 Essential (primary) hypertension: Secondary | ICD-10-CM | POA: Diagnosis not present

## 2021-01-14 LAB — CBC
HCT: 32.9 % — ABNORMAL LOW (ref 36.0–46.0)
Hemoglobin: 11.2 g/dL — ABNORMAL LOW (ref 12.0–15.0)
MCH: 31 pg (ref 26.0–34.0)
MCHC: 34 g/dL (ref 30.0–36.0)
MCV: 91.1 fL (ref 80.0–100.0)
Platelets: 218 10*3/uL (ref 150–400)
RBC: 3.61 MIL/uL — ABNORMAL LOW (ref 3.87–5.11)
RDW: 13.2 % (ref 11.5–15.5)
WBC: 5.2 10*3/uL (ref 4.0–10.5)
nRBC: 0 % (ref 0.0–0.2)

## 2021-01-14 LAB — BASIC METABOLIC PANEL
Anion gap: 5 (ref 5–15)
BUN: <5 mg/dL — ABNORMAL LOW (ref 8–23)
CO2: 27 mmol/L (ref 22–32)
Calcium: 9.1 mg/dL (ref 8.9–10.3)
Chloride: 107 mmol/L (ref 98–111)
Creatinine, Ser: 0.59 mg/dL (ref 0.44–1.00)
GFR, Estimated: >60 mL/min (ref >60)
Glucose, Bld: 144 mg/dL — ABNORMAL HIGH (ref 70–99)
Potassium: 3.9 mmol/L (ref 3.5–5.1)
Sodium: 139 mmol/L (ref 135–145)

## 2021-01-14 LAB — GLUCOSE, CAPILLARY
Glucose-Capillary: 180 mg/dL — ABNORMAL HIGH (ref 70–99)
Glucose-Capillary: 178 mg/dL — ABNORMAL HIGH (ref 70–99)

## 2021-01-14 LAB — MAGNESIUM: Magnesium: 1.9 mg/dL (ref 1.7–2.4)

## 2021-01-14 MED ORDER — BISACODYL 10 MG RE SUPP: 10.0000 mg | Freq: Every day | RECTAL | Status: DC | PRN

## 2021-01-14 MED ORDER — POLYETHYLENE GLYCOL 3350 17 G PO PACK: 17.0000 g | PACK | Freq: Every day | ORAL | 0 refills | Status: AC | PRN

## 2021-01-14 MED ORDER — BISACODYL 10 MG RE SUPP: 10.0000 mg | Freq: Every day | RECTAL | 0 refills | Status: DC | PRN

## 2021-01-14 NOTE — Care Management Important Message (Signed)
Important Message  Patient Details  Name: Suzanne Cantu MRN: [id number] Date of Birth: Apr 23, 1927   Medicare Important Message Given:  Other (see comment)  Patient being disharged home with [facility]. Out of respect for the patient and family no Important Message from [facility] given.   [name] A Alana [place] 01/14/2021, 9:22 AM

## 2021-01-14 NOTE — Progress Notes (Signed)
Physical Therapy Treatment Patient Details Name: Suzanne Cantu MRN: [id number] DOB: 1927-05-31 Today's Date: 01/14/2021    History of Present Illness Suzanne Cantu is a 87 y.o. F with vascular dementia, A. Fib and recurrent CVAs now on Hospice since last stroke in May, DM, and HTN who is family brought her in after constipation for 1 week.    PT Comments    Pt was long sitting in bed awake upon arriving. She is alert and cooperative however has baseline cognition deficits. Was very pleasant and able to follow simple one step commands throughout. Pt was able to exit L side of bed, stand to RW with assistance, and ambulate ~ 50 ft. Does have 2 occasions of LOB with intervention to prevent fall. Author reach out to pt's daughter to discuss upcoming DC. Per daughter, " Pt has all equipment she needs." Recommend HHPT at DC to improve safety with ADLs. Acute PT will continue to follow and progress as able per current POC. PT was sitting in recliner with lunch tray placed in front of her post session.     Follow Up Recommendations  Supervision/Assistance - 24 hour /HHPT through hospice?      Equipment Recommendations  None recommended by PT;Other (comment) (per discussion with daughter, all equipment needs are already met.)       Precautions / Restrictions Precautions Precautions: Fall Restrictions Weight Bearing Restrictions: No    Mobility  Bed Mobility Overal bed mobility: Needs Assistance Bed Mobility: Supine to Sit     Supine to sit: Min assist;HOB elevated     General bed mobility comments: required min assist to exit L side of bed. vcs for sequencing and step by step instructions to make transition easier    Transfers Overall transfer level: Needs assistance Equipment used: Rolling walker (2 wheeled) Transfers: Sit to/from Stand Sit to Stand: Min assist;From elevated surface;Min guard         General transfer comment: min assist to stand from standard height. CGA from  elevated bed height  Ambulation/Gait Ambulation/Gait assistance: Min assist Gait Distance (Feet): 50 Feet Assistive device: Rolling walker (2 wheeled) Gait Pattern/deviations: Step-through pattern;Decreased step length - right;Decreased step length - left;Shuffle;Narrow base of support Gait velocity: decr   General Gait Details: Pt was able to ambulate ~ 50 ft with slow step through gait pattern. constant vcs for increasing BOS to improve balance. did have 2 occsions of LOB with intervention to prevent fall. discussed with daughter concerns and need for constant assistance/gait belt for all OOB activity.     Balance Overall balance assessment: Needs assistance Sitting-balance support: Feet supported Sitting balance-Leahy Scale: Good Sitting balance - Comments: no balance defcits sitting EOB with all extremity support   Standing balance support: Bilateral upper extremity supported;During functional activity Standing balance-Leahy Scale: Poor Standing balance comment: High fall risk with ambulation/ dynamic standing activity       Cognition Arousal/Alertness: Awake/alert Behavior During Therapy: Flat affect Overall Cognitive Status: History of cognitive impairments - at baseline        General Comments: Pt was alert and able to follow commands however speech/ mumble make hard to truely assess orientation. was able to consistently follow one step commands.             Pertinent Vitals/Pain Pain Assessment: No/denies pain    PT Goals (current goals can now be found in the care plan section) Acute Rehab PT Goals Patient Stated Goal: none stated    Frequency    Min  2X/week      PT Plan Current plan remains appropriate       AM-PAC PT "6 Clicks" Mobility   Outcome Measure  Help needed turning from your back to your side while in a flat bed without using bedrails?: A Little Help needed moving from lying on your back to sitting on the side of a flat bed without using  bedrails?: A Little Help needed moving to and from a bed to a chair (including a wheelchair)?: A Little Help needed standing up from a chair using your arms (e.g., wheelchair or bedside chair)?: A Little Help needed to walk in hospital room?: A Little Help needed climbing 3-5 steps with a railing? : A Lot 6 Click Score: 17    End of Session Equipment Utilized During Treatment: Gait belt Activity Tolerance: Patient tolerated treatment well;Patient limited by fatigue Patient left: in chair;with call bell/phone within reach Nurse Communication: Mobility status PT Visit Diagnosis: Difficulty in walking, not elsewhere classified (R26.2);Muscle weakness (generalized) (M62.81)     Time: 1310-1330 PT Time Calculation (min) (ACUTE ONLY): 20 min  Charges:  $Gait Training: 8-22 mins                     Julaine Fusi PTA 01/14/21, 1:50 PM

## 2021-01-14 NOTE — Discharge Summary (Signed)
Physician Discharge Summary  Suzanne Cantu NFA:[id number] DOB: December 14, 1926 DOA: 01/10/2021  PCP: Corky DownsMasoud, Javed, MD  Admit date: 01/10/2021 Discharge date: 01/14/2021  Admitted From: Home. Disposition:  HOME with Hospice.   Recommendations for Outpatient Follow-up:  Follow up with hospice MD as recommended.   Discharge Condition: GUARDED.  CODE STATUS:DNR.  Diet recommendation: Heart Healthy / Carb Modified   Brief/Interim Summary: Suzanne Cantu is a 87 y.o. F with vascular dementia, A. Fib and recurrent CVAs now on Hospice since last stroke in May, DM, and HTN who is family brought her in after constipation for 1 week.   In the ER x-ray showed colonic distention.  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  ER physician placed a rectal tube and the hospitalist service were asked to evaluate.  Pt became alert and requesting for food. She started passing flatus, was started on clears and advanced as tolerated.  She was treated for UTI and discharged home with hospice.   Discharge Diagnoses:  Active Problems:   Encephalopathy acute   Essential hypertension   Controlled type 2 diabetes mellitus without complication, without long-term current use of insulin (HCC)   Seizure (HCC)   Protein-calorie malnutrition, severe   Paroxysmal atrial fibrillation (HCC)   Ileus (HCC)    Colonic pseudo obstruction: - possibly due to bed bound and hypokalemia. - initially rectal tube was placed and discontinued.  - passing flatus,  stated on clears and advanced to solids. She had 2 small bowel movements. She was able to tolerate without any abdominal pain , nausea or vomiting.  - not a surgical candidate.       Acute metabolic encephalopathy ruled out. Pt appears to be at baseline as per the daughter at bedside.  Pt is more calmer and not agitated. Continue with xanax.      Hypokalemia Replaced.     UTI: On IV rocephin. Urine cultures show insignificant growth.  completed 3 days of  IV rocephin.    Chronic atrial fibrillation: Rate controlled with BB and Transitioned to oral eliquis for anti coagulation.     Diet controlled DM Resume home meds.      Severe protein calorie malnutrition: Dietary on board.   Anxiety: Resume xanax.      Anemia of chronic disease Baseline hemoglobin between 9 to 11. Currently around 10 .   On the day of discharge. PT was helping pt from chair to the bathroom . PT found a maggot on the chair white padding. As per the RN and PT, the patient's daughter has been sitting and sleeping on the chair.  We have not found any open wounds on the patient.  It was probably a contamination .     Discharge Instructions  Discharge Instructions     Diet - low sodium heart healthy   Complete by: As directed    Discharge instructions   Complete by: As directed    Please follow up with HOpsice MD as recommended.      Allergies as of 01/14/2021       Reactions   Valproic Acid And Related Other (See Comments)   Low blood pressure, weakness.   Metaxalone Other (See Comments)   "It made me not feel right"   Peanut-containing Drug Products Other (See Comments)   Raises blood sugar        Medication List     STOP taking these medications    amLODipine 5 MG tablet Commonly known as: NORVASC   LORazepam 0.5  MG tablet Commonly known as: ATIVAN   magic mouthwash Soln   metFORMIN 500 MG tablet Commonly known as: GLUCOPHAGE   PreserVision AREDS 2 Caps   VITAMIN D-3 PO       TAKE these medications    acetaminophen 325 MG tablet Commonly known as: TYLENOL Take 1-2 tablets (325-650 mg total) by mouth every 6 (six) hours as needed for mild pain (pain score 1-3 or temp > 100.5).   ALPRAZolam 0.25 MG tablet Commonly known as: XANAX Take 1 tablet (0.25 mg total) by mouth daily. Notes to patient: Not given in hospital   apixaban 2.5 MG Tabs tablet Commonly known as: Eliquis Take 2 tablets (5 mg total) by mouth 2 (two)  times daily. Notes to patient: Not given in hospital   bisacodyl 10 MG suppository Commonly known as: DULCOLAX Place 1 suppository (10 mg total) rectally daily as needed for moderate constipation.   feeding supplement (GLUCERNA SHAKE) Liqd Take 237 mLs by mouth 3 (three) times daily between meals. Notes to patient: Not given in hospital   lactulose 10 GM/15ML solution Commonly known as: CHRONULAC Take 30 mLs by mouth every 2 (two) hours. until bowel movement.   Lumigan 0.01 % Soln Generic drug: bimatoprost Place 1 drop into both eyes at bedtime.   polyethylene glycol 17 g packet Commonly known as: MIRALAX / GLYCOLAX Take 17 g by mouth daily as needed.   senna-docusate 8.6-50 MG tablet Commonly known as: Senokot-S Take 1 tablet by mouth at bedtime. Notes to patient: Not given in hospital   valsartan 40 MG tablet Commonly known as: DIOVAN TAKE 1 TABLET BY MOUTH EVERY DAY Notes to patient: Not given in hospital        Allergies  Allergen Reactions   Valproic Acid And Related Other (See Comments)    Low blood pressure, weakness.   Metaxalone Other (See Comments)    "It made me not feel right"   Peanut-Containing Drug Products Other (See Comments)    Raises blood sugar    Consultations: Palliative care.    Procedures/Studies: CT ABDOMEN PELVIS W CONTRAST  Result Date: 01/10/2021 CLINICAL DATA:  Abdominal distension and history of constipation, initial encounter EXAM: CT ABDOMEN AND PELVIS WITH CONTRAST TECHNIQUE: Multidetector CT imaging of the abdomen and pelvis was performed using the standard protocol following bolus administration of intravenous contrast. CONTRAST:  2mL OMNIPAQUE IOHEXOL 350 MG/ML SOLN COMPARISON:  Plain film from earlier in the day FINDINGS: Lower chest: Mild dependent atelectatic changes are noted in the right base. Coronary calcifications are seen. Hepatobiliary: Gallbladder has been surgically removed. Biliary ductal dilatation is noted  consistent with the post cholecystectomy state. A small hypodensity is noted in the inferior aspect of the right lobe of the liver likely representing a small cyst. Pancreas: Unremarkable. No pancreatic ductal dilatation or surrounding inflammatory changes. Spleen: Normal in size without focal abnormality. Adrenals/Urinary Tract: Adrenal glands are within normal limits. Kidneys demonstrate a normal enhancement pattern bilaterally. Scattered small cysts are seen bilaterally. Prominent extrarenal pelvis on the right is noted. No true obstructive changes are seen. The bladder is significantly distended. Stomach/Bowel: Gaseous distension of the colon is noted similar to that seen on prior plain film examination. Mild areas of wall thickening are noted in the mid transverse colon although likely related to decompression as opposed to focal abnormality. The appendix is not visualized. No inflammatory changes are noted to suggest appendicitis. The small bowel and stomach are within normal limits. Vascular/Lymphatic: Aortic atherosclerosis. No enlarged abdominal  or pelvic lymph nodes. Reproductive: Status post hysterectomy. No adnexal masses. Other: No abdominal wall hernia or abnormality. No abdominopelvic ascites. Musculoskeletal: Postsurgical changes in the proximal right femur are seen. Degenerative changes of the lumbar spine are noted. IMPRESSION: Gaseous distension of the colon likely representing a diffuse ileus. Correlate with the clinical exam. No significant constipation or obstructing lesion is noted. Status post cholecystectomy with biliary ductal dilatation. This is felt to be within normal limits for the patient's age and post cholecystectomy state. Mild right basilar atelectasis. Electronically Signed   By: Alcide Clever M.D.   On: 01/10/2021 19:33   DG Abd 2 Views  Result Date: 01/10/2021 CLINICAL DATA:  Constipation.  Vomiting since yesterday. EXAM: ABDOMEN - 2 VIEW COMPARISON:  None. FINDINGS: There is  diffuse gaseous distension of the colon to the level of the left lower quadrant of the abdomen. Moderate volume of retained stool is noted within the right lower quadrant of the abdomen. No small bowel dilatation identified. There is no evidence of free air. No radio-opaque calculi or other significant radiographic abnormality is seen. IMPRESSION: Diffuse gaseous distension of the colon to the level of the left lower quadrant of the abdomen. Findings may reflect colonic ileus or distal colonic obstruction. If there is a clinical concern for distal colonic obstruction consider further investigation with CT of the abdomen pelvis with IV and oral contrast material Large amount of retained stool noted in the right lower quadrant of the abdomen. It is unclear whether not this represents stool in the cecum or redundant sigmoid colon. Electronically Signed   By: Signa Kell M.D.   On: 01/10/2021 14:40      Subjective: No new complaints.   Discharge Exam: Vitals:   01/14/21 1142 01/14/21 1529  BP: (!) 178/94 (!) 174/81  Pulse: 85 94  Resp: 19 18  Temp: 97.8 F (36.6 C) 97.7 F (36.5 C)  SpO2:  99%   Vitals:   01/13/21 2318 01/14/21 0500 01/14/21 1142 01/14/21 1529  BP: (!) 155/71 (!) 162/65 (!) 178/94 (!) 174/81  Pulse: 77 81 85 94  Resp: Temp: 97.8 F (36.6 C) 98.7 F (37.1 C) 97.8 F (36.6 C) 97.7 F (36.5 C)  TempSrc:   Oral Oral  SpO2: 97% 96%  99%  Weight:      Height:        General: Pt is alert, awake, not in acute distress Cardiovascular: RRR, S1/S2 +, no rubs, no gallops Respiratory: CTA bilaterally, no wheezing, no rhonchi Abdominal: Soft, NT, ND, bowel sounds + Extremities: no edema, no cyanosis    The results of significant diagnostics from this hospitalization (including imaging, microbiology, ancillary and laboratory) are listed below for reference.     Microbiology: Recent Results (from the past 240 hour(s))  Resp Panel by RT-PCR (Flu A&B, Covid)  Nasopharyngeal Swab     Status: None   Collection Time: 01/10/21 10:21 PM   Specimen: Nasopharyngeal Swab; Nasopharyngeal(NP) swabs in vial transport medium  Result Value Ref Range Status   SARS Coronavirus 2 by RT PCR NEGATIVE NEGATIVE Final    Comment: (NOTE) SARS-CoV-2 target nucleic acids are NOT DETECTED.  The SARS-CoV-2 RNA is generally detectable in upper respiratory specimens during the acute phase of infection. The lowest concentration of SARS-CoV-2 viral copies this assay can detect is 138 copies/mL. A negative result does not preclude SARS-Cov-2 infection and should not be used as the sole basis for treatment or other patient management decisions.  A negative result may occur with  improper specimen collection/handling, submission of specimen other than nasopharyngeal swab, presence of viral mutation(s) within the areas targeted by this assay, and inadequate number of viral copies(<138 copies/mL). A negative result must be combined with clinical observations, patient history, and epidemiological information. The expected result is Negative.  Fact Sheet for Patients:  BloggerCourse.com  Fact Sheet for Healthcare Providers:  SeriousBroker.it  This test is no t yet approved or cleared by the [place] FDA and  has been authorized for detection and/or diagnosis of SARS-CoV-2 by FDA under an Emergency Use Authorization (EUA). This EUA will remain  in effect (meaning this test can be used) for the duration of the COVID-19 declaration under Section 564(b)(1) of the Act, 21 U.S.C.section 360bbb-3(b)(1), unless the authorization is terminated  or revoked sooner.       Influenza A by PCR NEGATIVE NEGATIVE Final   Influenza B by PCR NEGATIVE NEGATIVE Final    Comment: (NOTE) The Xpert Xpress SARS-CoV-2/FLU/RSV plus assay is intended as an aid in the diagnosis of influenza from Nasopharyngeal swab specimens and should not be  used as a sole basis for treatment. Nasal washings and aspirates are unacceptable for Xpert Xpress SARS-CoV-2/FLU/RSV testing.  Fact Sheet for Patients: BloggerCourse.com  Fact Sheet for Healthcare Providers: SeriousBroker.it  This test is not yet approved or cleared by the [place] FDA and has been authorized for detection and/or diagnosis of SARS-CoV-2 by FDA under an Emergency Use Authorization (EUA). This EUA will remain in effect (meaning this test can be used) for the duration of the COVID-19 declaration under Section 564(b)(1) of the Act, 21 U.S.C. section 360bbb-3(b)(1), unless the authorization is terminated or revoked.  Performed at [facility], [address] [place], [place] [address]   Urine Culture     Status: Abnormal   Collection Time: 01/11/21  3:58 PM   Specimen: Urine, Random  Result Value Ref Range Status   Specimen Description   Final    URINE, RANDOM Performed at [facility], [address] [place], [place] [address]    Special Requests   Final    NONE Performed at [facility], [address] [place], [place] [address]    Culture (A)  Final    <10,000 COLONIES/mL INSIGNIFICANT GROWTH Performed at [facility], [address] [place], [place] [address]    Report Status 01/13/2021 FINAL  Final     Labs: BNP (last 3 results) No results for input(s): BNP in the last 8760 hours. Basic Metabolic Panel: Recent Labs  Lab 01/10/21 1326 01/11/21 0759 01/12/21 0503 01/14/21 0424  NA 134* 137 138 139  K 3.2* 3.3* 4.2 3.9  CL 98 104 107 107  CO2 GLUCOSE 272* 179* 148* 144*  BUN 10 6* 6* <5*  CREATININE 0.65 0.49 0.53 0.59  CALCIUM 9.5 8.9 8.8* 9.1  MG 2.0  --  1.7 1.9   Liver Function Tests: Recent Labs  Lab 01/10/21 1326 01/11/21 0759  AST 24 14*  ALT 12 11  ALKPHOS 85 71  BILITOT 1.0 0.9  PROT 7.2 5.7*  ALBUMIN 3.6 2.9*    Recent Labs  Lab 01/10/21 1326  LIPASE 26   No results for input(s): AMMONIA in the last 168 hours. CBC: Recent Labs  Lab 01/10/21 1326 01/11/21 0841 01/12/21 0503 01/13/21 0852 01/14/21 0424  WBC 13.7* 7.6 7.8 5.8 5.2  HGB 12.4 12.3 10.5* 10.8* 11.2*  HCT  36.8 35.6* 30.6* 32.2* 32.9*  MCV 90.9 88.6 91.9 91.5 91.1  PLT 339 215 193 213 218   Cardiac Enzymes: No results for input(s): CKTOTAL, CKMB, CKMBINDEX, TROPONINI in the last 168 hours. BNP: Invalid input(s): POCBNP CBG: Recent Labs  Lab 01/12/21 2043 01/13/21 1259 01/13/21 1642 01/14/21 0726 01/14/21 1224  GLUCAP 112* 169* 115* 180* 178*   D-Dimer No results for input(s): DDIMER in the last 72 hours. Hgb A1c No results for input(s): HGBA1C in the last 72 hours. Lipid Profile No results for input(s): CHOL, HDL, LDLCALC, TRIG, CHOLHDL, LDLDIRECT in the last 72 hours. Thyroid function studies No results for input(s): TSH, T4TOTAL, T3FREE, THYROIDAB in the last 72 hours.  Invalid input(s): FREET3 Anemia work up No results for input(s): VITAMINB12, FOLATE, FERRITIN, TIBC, IRON, RETICCTPCT in the last 72 hours. Urinalysis    Component Value Date/Time   COLORURINE YELLOW (A) 01/10/2021 2123   APPEARANCEUR HAZY (A) 01/10/2021 2123   APPEARANCEUR Clear 08/16/2020 1324   LABSPEC 1.015 01/10/2021 2123   PHURINE 6.0 01/10/2021 2123   GLUCOSEU >=500 (A) 01/10/2021 2123   HGBUR SMALL (A) 01/10/2021 2123   BILIRUBINUR NEGATIVE 01/10/2021 2123   BILIRUBINUR Negative 08/16/2020 1324   KETONESUR NEGATIVE 01/10/2021 2123   PROTEINUR 30 (A) 01/10/2021 2123   NITRITE NEGATIVE 01/10/2021 2123   LEUKOCYTESUR LARGE (A) 01/10/2021 2123   Sepsis Labs Invalid input(s): PROCALCITONIN,  WBC,  LACTICIDVEN Microbiology Recent Results (from the past 240 hour(s))  Resp Panel by RT-PCR (Flu A&B, Covid) Nasopharyngeal Swab     Status: None   Collection Time: 01/10/21 10:21 PM   Specimen: Nasopharyngeal Swab;  Nasopharyngeal(NP) swabs in vial transport medium  Result Value Ref Range Status   SARS Coronavirus 2 by RT PCR NEGATIVE NEGATIVE Final    Comment: (NOTE) SARS-CoV-2 target nucleic acids are NOT DETECTED.  The SARS-CoV-2 RNA is generally detectable in upper respiratory specimens during the acute phase of infection. The lowest concentration of SARS-CoV-2 viral copies this assay can detect is 138 copies/mL. A negative result does not preclude SARS-Cov-2 infection and should not be used as the sole basis for treatment or other patient management decisions. A negative result may occur with  improper specimen collection/handling, submission of specimen other than nasopharyngeal swab, presence of viral mutation(s) within the areas targeted by this assay, and inadequate number of viral copies(<138 copies/mL). A negative result must be combined with clinical observations, patient history, and epidemiological information. The expected result is Negative.  Fact Sheet for Patients:  BloggerCourse.com  Fact Sheet for Healthcare Providers:  SeriousBroker.it  This test is no t yet approved or cleared by the [place] FDA and  has been authorized for detection and/or diagnosis of SARS-CoV-2 by FDA under an Emergency Use Authorization (EUA). This EUA will remain  in effect (meaning this test can be used) for the duration of the COVID-19 declaration under Section 564(b)(1) of the Act, 21 U.S.C.section 360bbb-3(b)(1), unless the authorization is terminated  or revoked sooner.       Influenza A by PCR NEGATIVE NEGATIVE Final   Influenza B by PCR NEGATIVE NEGATIVE Final    Comment: (NOTE) The Xpert Xpress SARS-CoV-2/FLU/RSV plus assay is intended as an aid in the diagnosis of influenza from Nasopharyngeal swab specimens and should not be used as a sole basis for treatment. Nasal washings and aspirates are unacceptable for Xpert Xpress  SARS-CoV-2/FLU/RSV testing.  Fact Sheet for Patients: BloggerCourse.com  Fact Sheet for Healthcare Providers: SeriousBroker.it  This test is not  yet approved or cleared by the [place] and has been authorized for detection and/or diagnosis of SARS-CoV-2 by FDA under an Emergency Use Authorization (EUA). This EUA will remain in effect (meaning this test can be used) for the duration of the COVID-19 declaration under Section 564(b)(1) of the Act, 21 U.S.C. section 360bbb-3(b)(1), unless the authorization is terminated or revoked.  Performed at [facility], [address] [place], [place] [address]   Urine Culture     Status: Abnormal   Collection Time: 01/11/21  3:58 PM   Specimen: Urine, Random  Result Value Ref Range Status   Specimen Description   Final    URINE, RANDOM Performed at [facility], [address] [place], [place] [address]    Special Requests   Final    NONE Performed at [facility], [address] [place], [place] [address]    Culture (A)  Final    <10,000 COLONIES/mL INSIGNIFICANT GROWTH Performed at [facility], [address] [place], [place] [address]    Report Status 01/13/2021 FINAL  Final     Time coordinating discharge: 42 minutes.   SIGNED:   Kathlen Mody, MD  [facility] 01/14/2021, 4:08 PM

## 2021-01-14 NOTE — Progress Notes (Signed)
Occupational Therapy Treatment Patient Details Name: Suzanne Cantu MRN: [id number] DOB: 1926-07-21 Today's Date: 01/14/2021    History of present illness Suzanne Cantu is a 87 y.o. F with vascular dementia, A. Fib and recurrent CVAs now on Hospice since last stroke in May, DM, and HTN who is family brought her in after constipation for 1 week.   OT comments  Suzanne Cantu was seen for OT treatment on this date. Upon arrival to room pt reclined in bed awaiting discharge. Pt requires MAX A for LBD seated EOB. SETUP self-drinking seated EOB. MIN A + RW for ADL t/f - assist for lift off and RW mgmt. Pt making good progress toward goals. Pt continues to benefit from skilled OT services to maximize return to PLOF and minimize risk of future falls, injury, caregiver burden, and readmission. Will continue to follow POC. Discharge recommendation remains appropriate.    Follow Up Recommendations  No OT follow up;Supervision/Assistance - 24 hour    Equipment Recommendations  None recommended by OT    Recommendations for Other Services      Precautions / Restrictions Precautions Precautions: Fall Restrictions Weight Bearing Restrictions: No       Mobility Bed Mobility Overal bed mobility: Needs Assistance Bed Mobility: Supine to Sit;Sit to Supine     Supine to sit: Min assist;HOB elevated Sit to supine: Mod assist   General bed mobility comments: assist for BLE mgmt return to bed    Transfers Overall transfer level: Needs assistance Equipment used: Rolling walker (2 wheeled) Transfers: Sit to/from Stand Sit to Stand: Min assist;From elevated surface;Min guard         General transfer comment: hand over hand assist for RW mgmt    Balance Overall balance assessment: Needs assistance Sitting-balance support: Feet supported;Bilateral upper extremity supported Sitting balance-Leahy Scale: Good Sitting balance - Comments: no balance defcits sitting EOB with all extremity support    Standing balance support: Bilateral upper extremity supported;During functional activity Standing balance-Leahy Scale: Poor Standing balance comment: stagger steps with turning                           ADL either performed or assessed with clinical judgement   ADL Overall ADL's : Needs assistance/impaired                                       General ADL Comments: MAX A for LBD seated EOB. SETUP self-drinking seated EOB. MIN A + RW for ADL t/f - assist for lift off and RW mgmt      Cognition Arousal/Alertness: Awake/alert Behavior During Therapy: Flat affect Overall Cognitive Status: History of cognitive impairments - at baseline                                 General Comments: Suzanne Cantu assisted with mkaing pt needs known 2/2 expressive aphasia        Exercises Exercises: Other exercises Other Exercises Other Exercises: Pt and family educated re: OT role, DME recs, d/c recs, falls prevention, ECS Other Exercises: LBD, grooming, sup<>sit, sit<>stand, sitting/standing balance/tolerance, ~30 ft mobility           Pertinent Vitals/ Pain       Pain Assessment: No/denies pain         Frequency  Min 2X/week  Progress Toward Goals  OT Goals(current goals can now be found in the care plan section)  Progress towards OT goals: Progressing toward goals  Acute Rehab OT Goals Patient Stated Goal: to go home OT Goal Formulation: With patient/family Time For Goal Achievement: 01/26/21 Potential to Achieve Goals: Good ADL Goals Pt Will Perform Grooming: standing;with min guard assist Pt Will Perform Upper Body Bathing: with set-up;with supervision;sitting Pt Will Transfer to Toilet: with supervision;ambulating;regular height toilet  Plan Discharge plan remains appropriate;Frequency remains appropriate       AM-PAC OT "6 Clicks" Daily Activity     Outcome Measure   Help from another person eating meals?: A Little Help  from another person taking care of personal grooming?: A Little Help from another person toileting, which includes using toliet, bedpan, or urinal?: A Lot Help from another person bathing (including washing, rinsing, drying)?: A Lot Help from another person to put on and taking off regular upper body clothing?: A Lot Help from another person to put on and taking off regular lower body clothing?: A Lot 6 Click Score: 14    End of Session Equipment Utilized During Treatment: Rolling walker;Gait belt  OT Visit Diagnosis: Other abnormalities of gait and mobility (R26.89)   Activity Tolerance Patient tolerated treatment well   Patient Left in bed;with call bell/phone within reach;with bed alarm set;with family/visitor present   Nurse Communication          Time: 1610-9604 OT Time Calculation (min): 20 min  Charges: OT General Charges $OT Visit: 1 Visit OT Treatments $Self Care/Home Management : 8-22 mins  Kathie Dike, M.S. OTR/L  01/14/21, 4:05 PM  ascom [phone]

## 2021-01-14 NOTE — TOC Progression Note (Signed)
Transition of Care [facility]) - Progression Note    Patient Details  Name: Suzanne Cantu MRN: [id number] Date of Birth: 1927/06/16  Transition of Care [facility]) CM/SW Contact  Barrie Dunker, RN Phone Number: 01/14/2021, 8:33 AM  Clinical Narrative:    Discharge plan remains to go home with [facility], The patient has no equipment needs   Expected Discharge Plan: Home w Hospice Care Barriers to Discharge: Continued Medical Work up  Expected Discharge Plan and Services Expected Discharge Plan: Home w Hospice Care   Discharge Planning Services: CM Consult   Living arrangements for the past 2 months: Single Family Home                 DME Arranged: N/A         HH Arranged: NA           Social Determinants of Health (SDOH) Interventions    Readmission Risk Interventions No flowsheet data found.

## 2021-01-14 NOTE — Progress Notes (Signed)
Suzanne Cantu, PT was assisting patient from chair to bathroom and found a cream colored small worm, appears to be a maggott, located in the chair on the white padding the patient had been sitting on. Prior to the patient being seated in the chair, patient daughter has been sitting and sleeping in that chair. Patient does not have any skin breakdown nor open wounds of any kind. Daughter raises sheep and has 120+ of them.

## 2021-01-14 NOTE — Progress Notes (Signed)
[facility] [address] [facility]) Hospital Liaison RN note   Aleksa Collinsworth is a current hospice patient with a terminal diagnosis of hemiplegia and hemiparesis following cerebral infarction affecting right dominant side, hemiplegia and hemiparesis following cerebral infarction affecting left non-dominant side and other symptoms and signs involving cognitive functions following cerebral infarction. Patient had been experiencing vomiting for a couple of days prior to arriving at hospital. She had increasing agitation and decreased appetite. Distended abdomen. Transported to [facility] for evaluation. Patient was admitted on 7.11.22 with diagnosis of ileus. Per Dr. Dan Humphreys with [facility], this is a related hospital admission.   Visited with patient at bedside. Patient's daughter Junious Dresser at bedside. Per daughter, she is concerned because her mother ambulates frequently at home and becomes very agitated when she has to remain in the bed. Patient's assigned nurse at bedside and aware of concerns. Plan is to removed rectal tube and foley catheter. PT will work with patient in the afternoon. Daughter would like to take patient home with hospice services to continue as soon as possible. Care team aware of above. Support provided to patient and family.   Patient remains appropriate for inpatient status for treatment of ileus requiring IVF and potassium replacement.   VS: T 97.7, BP 174/81, HR 94, RR 18, O2 sat at 99% on room air.   I/O: 3347.10/2923   Abnormal Labs: Glucose: 144 (H) RBC: 3.61 (L) Hemoglobin: 11.2 (L) HCT: 32.9 (L)   No new diagnostics.   IV/PRN meds: 0.9 % NaCl with KCl 20 mEq/ L infusion Rate: 50 mL/hr   Problem List: Active Problems:   Encephalopathy acute   Essential hypertension   Controlled type 2 diabetes mellitus without complication, without long-term current use of insulin (HCC)   Seizure (HCC)   Protein-calorie malnutrition, severe   Paroxysmal atrial  fibrillation (HCC)   Ileus (HCC)     Colonic pseudo obstruction: - possibly due to bed bound and hypokalemia. - rectal tube in place. - passing flatus,  started on clears and if able to tolerate, will advance diet as tolerated. Currently on full liquid diet. Still has rectal tube without any stool.  - not a surgical candidate.       Acute metabolic encephalopathy ruled out. Pt appears to be at baseline as per the daughter at bedside.  Pt is more calmer and not agitated. Continue with xanax.      Hypokalemia Replaced.     UTI: IV rocephin. Urine cultures show insignificant growth.    Chronic atrial fibrillation: Rate controlled with BB and Transitioned to oral eliquis for anti coagulation.     Diet controlled DM Continue with SSI.    Discharge Planning: Ongoing. Patient continues to tolerate clear liquids. Hoping to discharge from the hospital 7.15.22.   Family: Daughter Junious Dresser at bedside. She desires her mother return home with hospice as soon as possible. Support provided.   IDG: Updated   Goals of Care: Ongoing; as above.   Please do not hesitate to call with hospice related questions or concerns.   Thank you,   Bobbie "Einar Gip, RN, BSN [facility] liaison [phone]

## 2021-01-14 NOTE — Progress Notes (Signed)
Physical Therapy Treatment Patient Details Name: Suzanne Cantu MRN: [id number] DOB: 09/09/1926 Today's Date: 01/14/2021    History of Present Illness Suzanne Cantu is a 87 y.o. F with vascular dementia, A. Fib and recurrent CVAs now on Hospice since last stroke in May, DM, and HTN who is family brought her in after constipation for 1 week.    PT Comments    Assisted with ambulatory transfer to toilet per patient/daughter request.  Requires consistent, hands-on min assist for balance, direction and RW management throughout mobility efforts. Continent bladder episode; RN informed/aware. Of note, small cream-colored worm noted on patient chuck pad during prep for sit/stand; RN informed/aware for inspection as appropriate.     Follow Up Recommendations  Supervision/Assistance - 24 hour     Equipment Recommendations  None recommended by PT;Other (comment) (per discussion with daughter, all equipment needs are already met.)    Recommendations for Other Services       Precautions / Restrictions Precautions Precautions: Fall Restrictions Weight Bearing Restrictions: No    Mobility  Bed Mobility Overal bed mobility: Needs Assistance Bed Mobility: Sit to Supine     Supine to sit: Min assist;HOB elevated Sit to supine: Min assist;Mod assist   General bed mobility comments: assist for BLE mgmt return to bed    Transfers Overall transfer level: Needs assistance Equipment used: Rolling walker (2 wheeled) Transfers: Sit to/from Stand Sit to Stand: Min assist         General transfer comment: hand over hand assist for RW mgmt  Ambulation/Gait Ambulation/Gait assistance: Min assist Gait Distance (Feet):  (25' x2) Assistive device: Rolling walker (2 wheeled) Gait Pattern/deviations: Step-through pattern;Decreased step length - right;Decreased step length - left;Shuffle;Narrow base of support Gait velocity: decr   General Gait Details: step through gait pattern, mildly  excessive weight shift to R LE; min cuing/assist for walker position and management   Stairs             Wheelchair Mobility    Modified Rankin (Stroke Patients Only)       Balance Overall balance assessment: Needs assistance Sitting-balance support: No upper extremity supported;Feet supported Sitting balance-Leahy Scale: Good Sitting balance - Comments: no balance defcits sitting EOB with all extremity support   Standing balance support: Bilateral upper extremity supported Standing balance-Leahy Scale: Fair Standing balance comment: stagger steps with turning                            Cognition Arousal/Alertness: Awake/alert Behavior During Therapy: Flat affect Overall Cognitive Status: History of cognitive impairments - at baseline                                 General Comments: daughter assisted with mkaing pt needs known 2/2 expressive aphasia      Exercises Other Exercises Other Exercises: Toilet transfer, ambulatory with RW, min assist; sit/stand from standard height toilet, min assist; dep assist (from daughter) for hygiene. Other Exercises: LBD, grooming, sup<>sit, sit<>stand, sitting/standing balance/tolerance, ~30 ft mobility    General Comments        Pertinent Vitals/Pain Pain Assessment: No/denies pain    Home Living                      Prior Function            PT Goals (current goals can now be found in   the care plan section) Acute Rehab PT Goals Patient Stated Goal: to go home PT Goal Formulation: With patient/family Time For Goal Achievement: 01/20/21 Potential to Achieve Goals: Good Progress towards PT goals: Progressing toward goals    Frequency    Min 2X/week      PT Plan Current plan remains appropriate    Co-evaluation              AM-PAC PT "6 Clicks" Mobility   Outcome Measure  Help needed turning from your back to your side while in a flat bed without using bedrails?: A  Little Help needed moving from lying on your back to sitting on the side of a flat bed without using bedrails?: A Little Help needed moving to and from a bed to a chair (including a wheelchair)?: A Little Help needed standing up from a chair using your arms (e.g., wheelchair or bedside chair)?: A Little Help needed to walk in hospital room?: A Little Help needed climbing 3-5 steps with a railing? : A Little 6 Click Score: 18    End of Session Equipment Utilized During Treatment: Gait belt Activity Tolerance: Patient tolerated treatment well Patient left: in bed;with bed alarm set;with call bell/phone within reach;with family/visitor present Nurse Communication: Mobility status PT Visit Diagnosis: Difficulty in walking, not elsewhere classified (R26.2);Muscle weakness (generalized) (M62.81)     Time: 1458-1517 PT Time Calculation (min) (ACUTE ONLY): 19 min  Charges:  $Gait Training: 8-22 mins $Therapeutic Activity: 8-22 mins                     H. , PT, DPT, NCS 01/14/21, 4:32 PM [phone]  

## 2021-01-25 ENCOUNTER — Ambulatory Visit (INDEPENDENT_AMBULATORY_CARE_PROVIDER_SITE_OTHER): Payer: Medicare Other | Admitting: Internal Medicine

## 2021-01-25 ENCOUNTER — Other Ambulatory Visit: Payer: Self-pay

## 2021-01-25 ENCOUNTER — Encounter: Payer: Self-pay | Admitting: Internal Medicine

## 2021-01-25 VITALS — BP 161/80 | HR 97 | Ht 65.0 in | Wt 89.4 lb

## 2021-01-25 DIAGNOSIS — K567 Ileus, unspecified: Secondary | ICD-10-CM | POA: Diagnosis not present

## 2021-01-25 DIAGNOSIS — I480 Paroxysmal atrial fibrillation: Secondary | ICD-10-CM | POA: Diagnosis not present

## 2021-01-25 DIAGNOSIS — W19XXXS Unspecified fall, sequela: Secondary | ICD-10-CM

## 2021-01-25 DIAGNOSIS — I10 Essential (primary) hypertension: Secondary | ICD-10-CM

## 2021-01-25 MED ORDER — APIXABAN 2.5 MG PO TABS: 5.0000 mg | ORAL_TABLET | Freq: Two times a day (BID) | ORAL | 6 refills | Status: DC

## 2021-01-25 NOTE — Assessment & Plan Note (Signed)
Atrial fibrillation is stable 

## 2021-01-25 NOTE — Assessment & Plan Note (Signed)
Blood pressure stable at the present time 

## 2021-01-25 NOTE — Assessment & Plan Note (Signed)
Patient is wheelchair-bound. 

## 2021-01-25 NOTE — Progress Notes (Signed)
Established Patient Office Visit  Subjective:  Patient ID: Suzanne Cantu, female    DOB: May 04, 1927  Age: 87 y.o. MRN: [id number]  CC:  Chief Complaint  Patient presents with   Follow-up    Patient is here for general check up and follow up from ED visit.     HPI  Suzanne Cantu presents for general check, patient denies any chest pain, he has a TIA of both week ago she can only [place] speaking with the facial weakness she is wheelchair-bound.  Her constipation is better, she is on pured diet.  She is being followed up by hospice and has sister at home to take care of personal needs  Past Medical History:  Diagnosis Date   Diabetes mellitus without complication (HCC)    Hypertension    Stroke [facility])     Past Surgical History:  Procedure Laterality Date   ABDOMINAL HYSTERECTOMY     CESAREAN SECTION     4   CHOLECYSTECTOMY     INTRAMEDULLARY (IM) NAIL INTERTROCHANTERIC Right 04/24/2020   Procedure: INTRAMEDULLARY (IM) NAIL INTERTROCHANTRIC;  Surgeon: Juanell Fairly, MD;  Location: [facility] ORS;  Service: Orthopedics;  Laterality: Right;   KIDNEY STONE SURGERY      Family History  Problem Relation Age of Onset   Diabetes Mellitus II Sister     Social History   Socioeconomic History   Marital status: Widowed    Spouse name: Not on file   Number of children: Not on file   Years of education: Not on file   Highest education level: Not on file  Occupational History   Not on file  Tobacco Use   Smoking status: Never   Smokeless tobacco: Never  Substance and Sexual Activity   Alcohol use: Never   Drug use: Never   Sexual activity: Not on file  Other Topics Concern   Not on file  Social History Narrative   Not on file   Social Determinants of Health   Financial Resource Strain: Not on file  Food Insecurity: Not on file  Transportation Needs: Not on file  Physical Activity: Not on file  Stress: Not on file  Social Connections: Not on file   Intimate Partner Violence: Not on file     Current Outpatient Medications:    acetaminophen (TYLENOL) 325 MG tablet, Take 1-2 tablets (325-650 mg total) by mouth every 6 (six) hours as needed for mild pain (pain score 1-3 or temp > 100.5)., Disp: , Rfl:    ALPRAZolam (XANAX) 0.25 MG tablet, Take 1 tablet (0.25 mg total) by mouth daily., Disp: 60 tablet, Rfl: 0   apixaban (ELIQUIS) 2.5 MG TABS tablet, Take 2 tablets (5 mg total) by mouth 2 (two) times daily., Disp: 120 tablet, Rfl: 6   bimatoprost (LUMIGAN) 0.01 % SOLN, Place 1 drop into both eyes at bedtime., Disp: , Rfl:    bisacodyl (DULCOLAX) 10 MG suppository, Place 1 suppository (10 mg total) rectally daily as needed for moderate constipation., Disp: 12 suppository, Rfl: 0   feeding supplement, GLUCERNA SHAKE, (GLUCERNA SHAKE) LIQD, Take 237 mLs by mouth 3 (three) times daily between meals., Disp: , Rfl: 0   lactulose (CHRONULAC) 10 GM/15ML solution, Take 30 mLs by mouth every 2 (two) hours. until bowel movement., Disp: , Rfl:    polyethylene glycol (MIRALAX / GLYCOLAX) 17 g packet, Take 17 g by mouth daily as needed., Disp: 14 each, Rfl: 0   senna-docusate (SENOKOT-S) 8.6-50 MG tablet, Take 1 tablet  by mouth at bedtime., Disp: , Rfl:    valsartan (DIOVAN) 40 MG tablet, TAKE 1 TABLET BY MOUTH EVERY DAY, Disp: 90 tablet, Rfl: 1   Allergies  Allergen Reactions   Valproic Acid And Related Other (See Comments)    Low blood pressure, weakness.   Metaxalone Other (See Comments)    "It made me not feel right"   Peanut-Containing Drug Products Other (See Comments)    Raises blood sugar    ROS Review of Systems  Constitutional: Negative.   HENT: Negative.    Eyes: Negative.   Respiratory: Negative.    Cardiovascular: Negative.   Gastrointestinal: Negative.   Skin: Negative.   Psychiatric/Behavioral: Negative.    All other systems reviewed and are negative.    Objective:    Physical Exam Vitals reviewed.  Constitutional:       Appearance: Normal appearance.  HENT:     Mouth/Throat:     Mouth: Mucous membranes are moist.  Eyes:     Pupils: Pupils are equal, round, and reactive to light.  Neck:     Vascular: No carotid bruit.  Cardiovascular:     Rate and Rhythm: Normal rate and regular rhythm.     Pulses: Normal pulses.     Heart sounds: Normal heart sounds.  Pulmonary:     Effort: Pulmonary effort is normal.     Breath sounds: Normal breath sounds.  Abdominal:     General: Bowel sounds are normal.     Palpations: Abdomen is soft. There is no hepatomegaly, splenomegaly or mass.     Tenderness: There is no abdominal tenderness.     Hernia: No hernia is present.  Musculoskeletal:        General: No tenderness. Normal range of motion.     Cervical back: Neck supple.     Right lower leg: No edema.     Left lower leg: No edema.  Skin:    Findings: No rash.  Neurological:     General: No focal deficit present.     Mental Status: She is alert and oriented to person, place, and time.     Motor: No weakness.  Psychiatric:        Mood and Affect: Affect normal.    BP (!) 161/80   Pulse 97   Ht 5\' 5"  (1.651 m)   Wt 89 lb 6.4 oz (40.6 kg)   BMI 14.88 kg/m  Wt Readings from Last 3 Encounters:  01/25/21 89 lb 6.4 oz (40.6 kg)  01/10/21 109 lb 1.6 oz (49.5 kg)  10/27/20 109 lb 11.2 oz (49.8 kg)     Health Maintenance Due  Topic Date Due   COVID-19 Vaccine (1) Never done   FOOT EXAM  Never done   OPHTHALMOLOGY EXAM  Never done   Zoster Vaccines- Shingrix (1 of 2) Never done   DEXA SCAN  Never done   PNA vac Low Risk Adult (1 of 2 - PCV13) Never done   HEMOGLOBIN A1C  10/20/2020    There are no preventive care reminders to display for this patient.  Lab Results  Component Value Date   TSH 2.468 08/01/2019   Lab Results  Component Value Date   WBC 5.2 01/14/2021   HGB 11.2 (L) 01/14/2021   HCT 32.9 (L) 01/14/2021   MCV 91.1 01/14/2021   PLT 218 01/14/2021   Lab Results  Component  Value Date   NA 139 01/14/2021   K 3.9 01/14/2021   CO2 27 01/14/2021  GLUCOSE 144 (H) 01/14/2021   BUN <5 (L) 01/14/2021   CREATININE 0.59 01/14/2021   BILITOT 0.9 01/11/2021   ALKPHOS 71 01/11/2021   AST 14 (L) 01/11/2021   ALT 11 01/11/2021   PROT 5.7 (L) 01/11/2021   ALBUMIN 2.9 (L) 01/11/2021   CALCIUM 9.1 01/14/2021   ANIONGAP 5 01/14/2021   Lab Results  Component Value Date   CHOL 185 10/28/2019   Lab Results  Component Value Date   HDL 51 10/28/2019   Lab Results  Component Value Date   LDLCALC 116 (H) 10/28/2019   Lab Results  Component Value Date   TRIG 90 10/28/2019   Lab Results  Component Value Date   CHOLHDL 3.6 10/28/2019   Lab Results  Component Value Date   HGBA1C 7.3 (H) 04/21/2020      Assessment & Plan:   Problem List Items Addressed This Visit       Cardiovascular and Mediastinum   Essential hypertension    Blood pressure stable at the present time       Relevant Medications   apixaban (ELIQUIS) 2.5 MG TABS tablet   Paroxysmal atrial fibrillation (HCC)    Atrial fibrillation is stable       Relevant Medications   apixaban (ELIQUIS) 2.5 MG TABS tablet     Digestive   Ileus (HCC) - Primary       ileus is better, patient is having bowel movement, she was advised to follow the diet recommended by the hospital         Other   Fall    Patient is wheelchair-bound        Meds ordered this encounter  Medications   apixaban (ELIQUIS) 2.5 MG TABS tablet    Sig: Take 2 tablets (5 mg total) by mouth 2 (two) times daily.    Dispense:  120 tablet    Refill:  6     Follow-up: No follow-ups on file.  Patient is DNR discussed with her daughter, she can go to the beach.  Corky Downs, MD

## 2021-01-25 NOTE — Assessment & Plan Note (Signed)
    ileus is better, patient is having bowel movement, she was advised to follow the diet recommended by the hospital

## 2021-03-06 ENCOUNTER — Other Ambulatory Visit: Payer: Self-pay | Admitting: Internal Medicine

## 2021-03-08 ENCOUNTER — Other Ambulatory Visit: Payer: Self-pay | Admitting: *Deleted

## 2021-03-08 MED ORDER — GLIMEPIRIDE 1 MG PO TABS: 1.0000 mg | ORAL_TABLET | Freq: Every day | ORAL | 6 refills | Status: DC

## 2021-03-09 ENCOUNTER — Ambulatory Visit

## 2021-03-10 ENCOUNTER — Ambulatory Visit (INDEPENDENT_AMBULATORY_CARE_PROVIDER_SITE_OTHER): Payer: Medicare Other | Admitting: *Deleted

## 2021-03-10 DIAGNOSIS — Z0000 Encounter for general adult medical examination without abnormal findings: Secondary | ICD-10-CM

## 2021-03-10 NOTE — Progress Notes (Signed)
Subjective:   Suzanne Cantu is a 87 y.o. female who presents for Medicare Annual (Subsequent) preventive examination. Visit performed using audio  Patient:home Provider:home  Review of Systems    Defer to provider Cardiac Risk Factors include: none     Objective:    Today's Vitals   03/10/21 1136  PainSc: 5    There is no height or weight on file to calculate BMI.  Advanced Directives 03/10/2021 01/10/2021 04/21/2020 11/25/2019 10/30/2019 08/06/2019 03/12/2019  Does Patient Have a Medical Advance Directive? Yes No No Yes Yes No No;Yes  Type of Advance Directive Living will - - Healthcare Power of [place];Living will Healthcare Power of [place];Living will - Living will  Does patient want to make changes to medical advance directive? - - - - - - No - Patient declined  Copy of Healthcare Power of Attorney in Chart? - - - - No - copy requested - -  Would patient like information on creating a medical advance directive? - No - Patient declined No - Patient declined - - - No - Patient declined    Current Medications (verified) Outpatient Encounter Medications as of 03/10/2021  Medication Sig   acetaminophen (TYLENOL) 325 MG tablet Take 1-2 tablets (325-650 mg total) by mouth every 6 (six) hours as needed for mild pain (pain score 1-3 or temp > 100.5).   ALPRAZolam (XANAX) 0.25 MG tablet Take 1 tablet (0.25 mg total) by mouth daily.   apixaban (ELIQUIS) 2.5 MG TABS tablet Take 2 tablets (5 mg total) by mouth 2 (two) times daily.   bimatoprost (LUMIGAN) 0.01 % SOLN Place 1 drop into both eyes at bedtime.   bisacodyl (DULCOLAX) 10 MG suppository Place 1 suppository (10 mg total) rectally daily as needed for moderate constipation.   feeding supplement, GLUCERNA SHAKE, (GLUCERNA SHAKE) LIQD Take 237 mLs by mouth 3 (three) times daily between meals.   glimepiride (AMARYL) 1 MG tablet Take 1 tablet (1 mg total) by mouth daily with breakfast.   lactulose (CHRONULAC) 10 GM/15ML solution Take  30 mLs by mouth every 2 (two) hours. until bowel movement.   polyethylene glycol (MIRALAX / GLYCOLAX) 17 g packet Take 17 g by mouth daily as needed.   senna-docusate (SENOKOT-S) 8.6-50 MG tablet Take 1 tablet by mouth at bedtime.   valsartan (DIOVAN) 40 MG tablet TAKE 1 TABLET BY MOUTH EVERY DAY   No facility-administered encounter medications on file as of 03/10/2021.    Allergies (verified) Valproic acid and related, Metaxalone, and Peanut-containing drug products   History: Past Medical History:  Diagnosis Date   Diabetes mellitus without complication (HCC)    Hypertension    Stroke [facility](HCC)    Past Surgical History:  Procedure Laterality Date   ABDOMINAL HYSTERECTOMY     CESAREAN SECTION     4   CHOLECYSTECTOMY     INTRAMEDULLARY (IM) NAIL INTERTROCHANTERIC Right 04/24/2020   Procedure: INTRAMEDULLARY (IM) NAIL INTERTROCHANTRIC;  Surgeon: Juanell FairlyKrasinski, Kevin, MD;  Location: [facility] ORS;  Service: Orthopedics;  Laterality: Right;   KIDNEY STONE SURGERY     Family History  Problem Relation Age of Onset   Diabetes Mellitus II Sister    Social History   Socioeconomic History   Marital status: Widowed    Spouse name: Not on file   Number of children: Not on file   Years of education: Not on file   Highest education level: Not on file  Occupational History   Not on file  Tobacco Use  Smoking status: Never   Smokeless tobacco: Never  Substance and Sexual Activity   Alcohol use: Never   Drug use: Never   Sexual activity: Not on file  Other Topics Concern   Not on file  Social History Narrative   Not on file   Social Determinants of Health   Financial Resource Strain: Low Risk    Difficulty of Paying Living Expenses: Not hard at all  Food Insecurity: No Food Insecurity   Worried About Programme researcher, broadcasting/film/video in the Last Year: Never true   Ran Out of Food in the Last Year: Never true  Transportation Needs: No Transportation Needs   Lack of Transportation (Medical): No    Lack of Transportation (Non-Medical): No  Physical Activity: Inactive   Days of Exercise per Week: 0 days   Minutes of Exercise per Session: 0 min  Stress: No Stress Concern Present   Feeling of Stress : Not at all  Social Connections: Socially Isolated   Frequency of Communication with Friends and Family: More than three times a week   Frequency of Social Gatherings with Friends and Family: Never   Attends Religious Services: Never   Database administrator or Organizations: No   Attends Banker Meetings: Never   Marital Status: Widowed    Tobacco Counseling Counseling given: Not Answered   Clinical Intake:  Pre-visit preparation completed: Yes  Pain : 0-10 Pain Score: 5  Pain Type: Chronic pain Pain Location: Other (Comment) Pain Onset: More than a month ago Pain Frequency: Rarely Effect of Pain on Daily Activities: yes     Nutritional Status: BMI <19  Underweight Nutritional Risks: Failure to thrive Diabetes: Yes CBG done?: No Did pt. bring in CBG monitor from home?: No  How often do you need to have someone help you when you read instructions, pamphlets, or other written materials from your doctor or pharmacy?: 5 - Always  Diabetic?yes  Interpreter Needed?: No  Comments: 12 Information entered by :: Brien Mates   Activities of Daily Living In your present state of health, do you have any difficulty performing the following activities: 03/10/2021 01/11/2021  Hearing? N Y  Vision? N N  Difficulty concentrating or making decisions? Malvin Johns  Walking or climbing stairs? Y Y  Dressing or bathing? Y Y  Doing errands, shopping? Malvin Johns  Preparing Food and eating ? Y -  Using the Toilet? Y -  In the past six months, have you accidently leaked urine? Y -  Do you have problems with loss of bowel control? Y -  Managing your Medications? Y -  Managing your Finances? Y -  Housekeeping or managing your Housekeeping? Y -  Some recent data might be hidden     Patient Care Team: Corky Downs, MD as PCP - General (Internal Medicine)  Indicate any recent Medical Services you may have received from other than [facility] providers in the past year (date may be approximate).     Assessment:   This is a routine wellness examination for Suzanne Cantu.  Hearing/Vision screen No results found.  Dietary issues and exercise activities discussed: Current Exercise Habits: The patient does not participate in regular exercise at present (patient no longer ambulatory), Exercise limited by: Other - see comments (nonambulatory)   Goals Addressed   None    Depression Screen PHQ 2/9 Scores 03/10/2021 03/10/2021 08/19/2019 08/06/2019 08/06/2019  PHQ - 2 Score 0 0 0 0 0    Fall Risk Fall Risk  03/10/2021  Falls in the past year? Exclusion - non ambulatory  Risk for fall due to : History of fall(s);Other (Comment)  Risk for fall due to: Comment no longer ambulatory  Follow up Falls evaluation completed    FALL RISK PREVENTION PERTAINING TO THE HOME:  Any stairs in or around the home? No  If so, are there any without handrails? No  Home free of loose throw rugs in walkways, pet beds, electrical cords, etc? Yes  Adequate lighting in your home to reduce risk of falls? Yes   ASSISTIVE DEVICES UTILIZED TO PREVENT FALLS:  Life alert? No  Use of a cane, walker or w/c?  W/C Grab bars in the bathroom? No  Shower chair or bench in shower? No  Elevated toilet seat or a handicapped toilet? No   TIMED UP AND GO:  Was the test performed? No .  Length of time to ambulate 10 feet: na    Cognitive Function: MMSE - Mini Mental State Exam 03/10/2021  Not completed: Unable to complete        Immunizations Immunization History  Administered Date(s) Administered   Tdap 03/17/2016    TDAP status: Up to date  Flu Vaccine status: Up to date  Pneumococcal vaccine status: Declined,  Education has been provided regarding the importance of this vaccine but patient still  declined. Advised may receive this vaccine at local pharmacy or Health Dept. Aware to provide a copy of the vaccination record if obtained from local pharmacy or Health Dept. Verbalized acceptance and understanding.   Covid-19 vaccine status: Completed vaccines  Qualifies for Shingles Vaccine? Yes   Zostavax completed Yes   Shingrix Completed?: Yes  Screening Tests Health Maintenance  Topic Date Due   COVID-19 Vaccine (1) Never done   FOOT EXAM  Never done   OPHTHALMOLOGY EXAM  Never done   Zoster Vaccines- Shingrix (1 of 2) Never done   DEXA SCAN  Never done   PNA vac Low Risk Adult (1 of 2 - PCV13) Never done   HEMOGLOBIN A1C  10/20/2020   INFLUENZA VACCINE  Never done   TETANUS/TDAP  03/17/2026   HPV VACCINES  Aged Out    Health Maintenance  Health Maintenance Due  Topic Date Due   COVID-19 Vaccine (1) Never done   FOOT EXAM  Never done   OPHTHALMOLOGY EXAM  Never done   Zoster Vaccines- Shingrix (1 of 2) Never done   DEXA SCAN  Never done   PNA vac Low Risk Adult (1 of 2 - PCV13) Never done   HEMOGLOBIN A1C  10/20/2020   INFLUENZA VACCINE  Never done    Colorectal cancer screening: No longer required.   Mammogram status: No longer required due to age.    Lung Cancer Screening: (Low Dose CT Chest recommended if Age 42-80 years, 30 pack-year currently smoking OR have quit w/in 15years.) does not qualify.   Lung Cancer Screening Referral: na  Additional Screening:  Hepatitis C Screening: does not qualify; Completed na  Vision Screening: Recommended annual ophthalmology exams for early detection of glaucoma and other disorders of the eye. Is the patient up to date with their annual eye exam?  Yes  Who is the provider or what is the name of the office in which the patient attends annual eye exams? unknown If pt is not established with a provider, would they like to be referred to a provider to establish care?  established .   Dental Screening: Recommended annual  dental exams for  proper oral hygiene  Community Resource Referral / Chronic Care Management: CRR required this visit?  No   CCM required this visit?  No      Plan:     I have personally reviewed and noted the following in the patient's chart:   Medical and social history Use of alcohol, tobacco or illicit drugs  Current medications and supplements including opioid prescriptions.  Functional ability and status Nutritional status Physical activity Advanced directives List of other physicians Hospitalizations, surgeries, and ER visits in previous 12 months Vitals Screenings to include cognitive, depression, and falls Referrals and appointments  In addition, I have reviewed and discussed with patient certain preventive protocols, quality metrics, and best practice recommendations. A written personalized care plan for preventive services as well as general preventive health recommendations were provided to patient.     Melody Comas, CMA   03/10/2021   Nurse Notes:  Suzanne Cantu , Thank you for taking time to come for your Medicare Wellness Visit. I appreciate your ongoing commitment to your health goals. Please review the following plan we discussed and let me know if I can assist you in the future.   These are the goals we discussed:  Goals   None     This is a list of the screening recommended for you and due dates:  Health Maintenance  Topic Date Due   COVID-19 Vaccine (1) Never done   Complete foot exam   Never done   Eye exam for diabetics  Never done   Zoster (Shingles) Vaccine (1 of 2) Never done   DEXA scan (bone density measurement)  Never done   Pneumonia vaccines (1 of 2 - PCV13) Never done   Hemoglobin A1C  10/20/2020   Flu Shot  Never done   Tetanus Vaccine  03/17/2026   HPV Vaccine  Aged Out    Time spent with patient 30 min

## 2021-03-11 NOTE — Progress Notes (Signed)
I have reviewed this visit and agree with the documentation.   

## 2021-03-16 ENCOUNTER — Other Ambulatory Visit: Payer: Self-pay | Admitting: Internal Medicine

## 2021-03-16 DIAGNOSIS — I480 Paroxysmal atrial fibrillation: Secondary | ICD-10-CM

## 2021-03-26 ENCOUNTER — Other Ambulatory Visit: Payer: Self-pay | Admitting: Internal Medicine

## 2021-03-28 ENCOUNTER — Other Ambulatory Visit: Payer: Self-pay

## 2021-03-28 ENCOUNTER — Encounter: Payer: Self-pay | Admitting: Internal Medicine

## 2021-03-28 ENCOUNTER — Ambulatory Visit (INDEPENDENT_AMBULATORY_CARE_PROVIDER_SITE_OTHER): Payer: Medicare Other | Admitting: Internal Medicine

## 2021-03-28 VITALS — BP 159/77 | HR 94 | Ht 65.0 in | Wt 77.0 lb

## 2021-03-28 DIAGNOSIS — E119 Type 2 diabetes mellitus without complications: Secondary | ICD-10-CM | POA: Diagnosis not present

## 2021-03-28 DIAGNOSIS — W19XXXS Unspecified fall, sequela: Secondary | ICD-10-CM

## 2021-03-28 DIAGNOSIS — I160 Hypertensive urgency: Secondary | ICD-10-CM

## 2021-03-28 DIAGNOSIS — E43 Unspecified severe protein-calorie malnutrition: Secondary | ICD-10-CM

## 2021-03-28 DIAGNOSIS — M5440 Lumbago with sciatica, unspecified side: Secondary | ICD-10-CM

## 2021-03-28 DIAGNOSIS — I10 Essential (primary) hypertension: Secondary | ICD-10-CM

## 2021-03-28 DIAGNOSIS — I482 Chronic atrial fibrillation, unspecified: Secondary | ICD-10-CM | POA: Diagnosis not present

## 2021-03-28 DIAGNOSIS — G8929 Other chronic pain: Secondary | ICD-10-CM

## 2021-03-28 NOTE — Assessment & Plan Note (Addendum)
Patient is wheelchair-bound she has 2 caregiver who gives her care on a 12-hour shift

## 2021-03-28 NOTE — Assessment & Plan Note (Signed)
-   Patient's back pain is under control with medication.  - Encouraged the patient to stretch or do yoga as able to help with back pain 

## 2021-03-28 NOTE — Progress Notes (Signed)
Established Patient Office Visit  Subjective:  Patient ID: Suzanne Cantu, female    DOB: 10-29-1926  Age: 87 y.o. MRN: [id number]  CC:  Chief Complaint  Patient presents with   Follow-up    HPI  Suzanne Cantu presents for  General check  Past Medical History:  Diagnosis Date   Diabetes mellitus without complication (HCC)    Hypertension    Stroke [facility])     Past Surgical History:  Procedure Laterality Date   ABDOMINAL HYSTERECTOMY     CESAREAN SECTION     4   CHOLECYSTECTOMY     INTRAMEDULLARY (IM) NAIL INTERTROCHANTERIC Right 04/24/2020   Procedure: INTRAMEDULLARY (IM) NAIL INTERTROCHANTRIC;  Surgeon: Juanell Fairly, MD;  Location: [facility] ORS;  Service: Orthopedics;  Laterality: Right;   KIDNEY STONE SURGERY      Family History  Problem Relation Age of Onset   Diabetes Mellitus II Sister     Social History   Socioeconomic History   Marital status: Widowed    Spouse name: Not on file   Number of children: Not on file   Years of education: Not on file   Highest education level: Not on file  Occupational History   Not on file  Tobacco Use   Smoking status: Never   Smokeless tobacco: Never  Substance and Sexual Activity   Alcohol use: Never   Drug use: Never   Sexual activity: Not on file  Other Topics Concern   Not on file  Social History Narrative   Not on file   Social Determinants of Health   Financial Resource Strain: Low Risk    Difficulty of Paying Living Expenses: Not hard at all  Food Insecurity: No Food Insecurity   Worried About Running Out of Food in the Last Year: Never true   Ran Out of Food in the Last Year: Never true  Transportation Needs: No Transportation Needs   Lack of Transportation (Medical): No   Lack of Transportation (Non-Medical): No  Physical Activity: Inactive   Days of Exercise per Week: 0 days   Minutes of Exercise per Session: 0 min  Stress: No Stress Concern Present   Feeling of Stress : Not at all   Social Connections: Socially Isolated   Frequency of Communication with Friends and Family: More than three times a week   Frequency of Social Gatherings with Friends and Family: Never   Attends Religious Services: Never   Database administrator or Organizations: No   Attends Banker Meetings: Never   Marital Status: Widowed  Catering manager Violence: Not At Risk   Fear of Current or Ex-Partner: No   Emotionally Abused: No   Physically Abused: No   Sexually Abused: No     Current Outpatient Medications:    acetaminophen (TYLENOL) 325 MG tablet, Take 1-2 tablets (325-650 mg total) by mouth every 6 (six) hours as needed for mild pain (pain score 1-3 or temp > 100.5)., Disp: , Rfl:    ALPRAZolam (XANAX) 0.25 MG tablet, Take 1 tablet (0.25 mg total) by mouth daily., Disp: 60 tablet, Rfl: 0   bimatoprost (LUMIGAN) 0.01 % SOLN, Place 1 drop into both eyes at bedtime., Disp: , Rfl:    [employer] GENTLE LAXATIVE 10 MG suppository, PLACE 1 SUPPOSITORY RECTALLY EVERY DAY AS NEEDED FOR CONSTIPATION, Disp: 16 suppository, Rfl: 4   ELIQUIS 2.5 MG TABS tablet, TAKE 2 TABLETS (5 MG TOTAL) BY MOUTH 2 (TWO) TIMES DAILY., Disp: 120 tablet, Rfl: 3  feeding supplement, GLUCERNA SHAKE, (GLUCERNA SHAKE) LIQD, Take 237 mLs by mouth 3 (three) times daily between meals., Disp: , Rfl: 0   GAVILAX 17 GM/SCOOP powder, TAKE 17G BY MOUTH ONCE DAILY *MIX IN 6-8OZ OF WATER*, Disp: 238 g, Rfl: 2   glimepiride (AMARYL) 1 MG tablet, Take 1 tablet (1 mg total) by mouth daily with breakfast., Disp: 30 tablet, Rfl: 6   lactulose (CHRONULAC) 10 GM/15ML solution, Take 30 mLs by mouth every 2 (two) hours. until bowel movement., Disp: , Rfl:    metFORMIN (GLUCOPHAGE) 500 MG tablet, Take 500 mg by mouth 2 (two) times daily., Disp: , Rfl:    polyethylene glycol (MIRALAX / GLYCOLAX) 17 g packet, Take 17 g by mouth daily as needed., Disp: 14 each, Rfl: 0   senna-docusate (SENOKOT-S) 8.6-50 MG tablet, Take 1 tablet by mouth  at bedtime., Disp: , Rfl:    valsartan (DIOVAN) 40 MG tablet, TAKE 1 TABLET BY MOUTH EVERY DAY, Disp: 90 tablet, Rfl: 1   Allergies  Allergen Reactions   Valproic Acid And Related Other (See Comments)    Low blood pressure, weakness.   Metaxalone Other (See Comments)    "It made me not feel right"   Peanut-Containing Drug Products Other (See Comments)    Raises blood sugar    ROS Review of Systems  Constitutional: Negative.   HENT: Negative.    Eyes: Negative.   Respiratory: Negative.    Cardiovascular: Negative.   Gastrointestinal: Negative.   Endocrine: Negative.   Genitourinary: Negative.   Musculoskeletal: Negative.   Skin: Negative.   Allergic/Immunologic: Negative.   Neurological: Negative.   Hematological: Negative.   Psychiatric/Behavioral: Negative.    All other systems reviewed and are negative.    Objective:    Physical Exam Vitals reviewed.  Constitutional:      Appearance: Normal appearance.  HENT:     Mouth/Throat:     Mouth: Mucous membranes are moist.  Eyes:     Pupils: Pupils are equal, round, and reactive to light.  Neck:     Vascular: No carotid bruit.  Cardiovascular:     Rate and Rhythm: Normal rate and regular rhythm.     Pulses: Normal pulses.     Heart sounds: Normal heart sounds.  Pulmonary:     Effort: Pulmonary effort is normal.     Breath sounds: Normal breath sounds.  Abdominal:     General: Bowel sounds are normal.     Palpations: Abdomen is soft. There is no hepatomegaly, splenomegaly or mass.     Tenderness: There is no abdominal tenderness.     Hernia: No hernia is present.  Musculoskeletal:        General: No tenderness.     Cervical back: Neck supple.     Right lower leg: No edema.     Left lower leg: No edema.  Skin:    Findings: No rash.  Neurological:     Mental Status: She is alert and oriented to person, place, and time.     Motor: No weakness.  Psychiatric:        Mood and Affect: Mood and affect normal.         Behavior: Behavior normal.    BP (!) 159/77   Pulse 94   Ht 5\' 5"  (1.651 m)   Wt 77 lb (34.9 kg) Comment: Reported by caregiver, pt in wheelchair  BMI 12.81 kg/m  Wt Readings from Last 3 Encounters:  03/28/21 77 lb (34.9 kg)  01/25/21 89 lb 6.4 oz (40.6 kg)  01/10/21 109 lb 1.6 oz (49.5 kg)     Health Maintenance Due  Topic Date Due   COVID-19 Vaccine (1) Never done   FOOT EXAM  Never done   OPHTHALMOLOGY EXAM  Never done   Zoster Vaccines- Shingrix (1 of 2) Never done   DEXA SCAN  Never done   HEMOGLOBIN A1C  10/20/2020   INFLUENZA VACCINE  Never done    There are no preventive care reminders to display for this patient.  Lab Results  Component Value Date   TSH 2.468 08/01/2019   Lab Results  Component Value Date   WBC 5.2 01/14/2021   HGB 11.2 (L) 01/14/2021   HCT 32.9 (L) 01/14/2021   MCV 91.1 01/14/2021   PLT 218 01/14/2021   Lab Results  Component Value Date   NA 139 01/14/2021   K 3.9 01/14/2021   CO2 27 01/14/2021   GLUCOSE 144 (H) 01/14/2021   BUN <5 (L) 01/14/2021   CREATININE 0.59 01/14/2021   BILITOT 0.9 01/11/2021   ALKPHOS 71 01/11/2021   AST 14 (L) 01/11/2021   ALT 11 01/11/2021   PROT 5.7 (L) 01/11/2021   ALBUMIN 2.9 (L) 01/11/2021   CALCIUM 9.1 01/14/2021   ANIONGAP 5 01/14/2021   Lab Results  Component Value Date   CHOL 185 10/28/2019   Lab Results  Component Value Date   HDL 51 10/28/2019   Lab Results  Component Value Date   LDLCALC 116 (H) 10/28/2019   Lab Results  Component Value Date   TRIG 90 10/28/2019   Lab Results  Component Value Date   CHOLHDL 3.6 10/28/2019   Lab Results  Component Value Date   HGBA1C 7.3 (H) 04/21/2020      Assessment & Plan:   Problem List Items Addressed This Visit       Cardiovascular and Mediastinum   Hypertensive urgency   Essential hypertension     Patient denies any chest pain or shortness of breath there is no history of palpitation or paroxysmal nocturnal dyspnea    patient was advised to follow low-salt low-cholesterol diet          Atrial fibrillation, chronic (HCC) - Primary     Endocrine   Controlled type 2 diabetes mellitus without complication, without long-term current use of insulin (HCC)    - The patient's blood sugar is labile on med. - The patient will continue the current treatment regimen.  - I encouraged the patient to regularly check blood sugar.  - I encouraged the patient to monitor diet. I encouraged the patient to eat low-carb and low-sugar to help prevent blood sugar spikes.  - I encouraged the patient to continue following their prescribed treatment plan for diabetes -         Other   Chronic back pain    - Patient's back pain is under control with medication.  - Encouraged the patient to stretch or do yoga as able to help with back pain      Fall    Patient is wheelchair-bound she has 2 caregiver who gives her care on a 12-hour shift      Protein-calorie malnutrition, severe    Patient is eating better, she is DNR, blood sugar is under control, she is wheelchair-bound, she has been taking care of her by 2 caregiver round-the-clock       No orders of the defined types were placed in this encounter.   Follow-up:  No follow-ups on file.    Corky Downs, MD

## 2021-03-28 NOTE — Assessment & Plan Note (Signed)

## 2021-03-28 NOTE — Assessment & Plan Note (Signed)
Patient is eating better, she is DNR, blood sugar is under control, she is wheelchair-bound, she has been taking care of her by 2 caregiver round-the-clock

## 2021-03-28 NOTE — Assessment & Plan Note (Signed)

## 2021-05-02 ENCOUNTER — Other Ambulatory Visit: Payer: Self-pay | Admitting: *Deleted

## 2021-05-02 MED ORDER — AMPICILLIN 500 MG PO CAPS: 500.0000 mg | ORAL_CAPSULE | Freq: Three times a day (TID) | ORAL | 0 refills | Status: AC

## 2021-05-18 ENCOUNTER — Other Ambulatory Visit: Payer: Self-pay

## 2021-08-28 ENCOUNTER — Other Ambulatory Visit: Payer: Self-pay | Admitting: Internal Medicine

## 2021-09-22 ENCOUNTER — Other Ambulatory Visit: Payer: Self-pay | Admitting: Internal Medicine

## 2021-10-14 ENCOUNTER — Other Ambulatory Visit: Payer: Self-pay | Admitting: Internal Medicine

## 2021-10-31 DEATH — deceased
